# Patient Record
Sex: Female | Born: 1940
Health system: Southern US, Community
[De-identification: ages and names within clinical notes are randomized; demographics above are authoritative.]

## PROBLEM LIST (undated history)

## (undated) DIAGNOSIS — I340 Nonrheumatic mitral (valve) insufficiency: Secondary | ICD-10-CM

## (undated) DIAGNOSIS — Z9989 Dependence on other enabling machines and devices: Secondary | ICD-10-CM

## (undated) DIAGNOSIS — E785 Hyperlipidemia, unspecified: Secondary | ICD-10-CM

## (undated) DIAGNOSIS — G4733 Obstructive sleep apnea (adult) (pediatric): Secondary | ICD-10-CM

## (undated) DIAGNOSIS — I420 Dilated cardiomyopathy: Secondary | ICD-10-CM

## (undated) DIAGNOSIS — C801 Malignant (primary) neoplasm, unspecified: Secondary | ICD-10-CM

## (undated) DIAGNOSIS — H269 Unspecified cataract: Secondary | ICD-10-CM

## (undated) DIAGNOSIS — M1990 Unspecified osteoarthritis, unspecified site: Secondary | ICD-10-CM

## (undated) DIAGNOSIS — K219 Gastro-esophageal reflux disease without esophagitis: Secondary | ICD-10-CM

## (undated) DIAGNOSIS — T7840XA Allergy, unspecified, initial encounter: Secondary | ICD-10-CM

## (undated) DIAGNOSIS — I351 Nonrheumatic aortic (valve) insufficiency: Secondary | ICD-10-CM

## (undated) DIAGNOSIS — D649 Anemia, unspecified: Secondary | ICD-10-CM

## (undated) DIAGNOSIS — I10 Essential (primary) hypertension: Secondary | ICD-10-CM

## (undated) DIAGNOSIS — I071 Rheumatic tricuspid insufficiency: Secondary | ICD-10-CM

## (undated) DIAGNOSIS — G459 Transient cerebral ischemic attack, unspecified: Secondary | ICD-10-CM

## (undated) DIAGNOSIS — Z87898 Personal history of other specified conditions: Secondary | ICD-10-CM

## (undated) DIAGNOSIS — I272 Pulmonary hypertension, unspecified: Secondary | ICD-10-CM

## (undated) HISTORY — DX: Obstructive sleep apnea (adult) (pediatric): Z99.89

## (undated) HISTORY — PX: MELANOMA EXCISION: SHX5266

## (undated) HISTORY — DX: Nonrheumatic mitral (valve) insufficiency: I34.0

## (undated) HISTORY — DX: Anemia, unspecified: D64.9

## (undated) HISTORY — PX: ABDOMINAL HYSTERECTOMY: SHX81

## (undated) HISTORY — DX: Pulmonary hypertension, unspecified: I27.20

## (undated) HISTORY — PX: CHOLECYSTECTOMY: SHX55

## (undated) HISTORY — PX: CATARACT EXTRACTION, BILATERAL: SHX1313

## (undated) HISTORY — DX: Personal history of other specified conditions: Z87.898

## (undated) HISTORY — DX: Dilated cardiomyopathy: I42.0

## (undated) HISTORY — DX: Allergy, unspecified, initial encounter: T78.40XA

## (undated) HISTORY — DX: Unspecified osteoarthritis, unspecified site: M19.90

## (undated) HISTORY — PX: SKIN BIOPSY: SHX1

## (undated) HISTORY — DX: Essential (primary) hypertension: I10

## (undated) HISTORY — PX: ANKLE FRACTURE SURGERY: SHX122

## (undated) HISTORY — DX: Hyperlipidemia, unspecified: E78.5

## (undated) HISTORY — DX: Obstructive sleep apnea (adult) (pediatric): G47.33

## (undated) HISTORY — DX: Gastro-esophageal reflux disease without esophagitis: K21.9

## (undated) HISTORY — DX: Malignant (primary) neoplasm, unspecified: C80.1

## (undated) HISTORY — DX: Nonrheumatic aortic (valve) insufficiency: I35.1

## (undated) HISTORY — DX: Unspecified cataract: H26.9

## (undated) HISTORY — DX: Rheumatic tricuspid insufficiency: I07.1

## (undated) HISTORY — PX: DENTAL SURGERY: SHX609

## (undated) HISTORY — PX: APPENDECTOMY: SHX54

## (undated) HISTORY — DX: Transient cerebral ischemic attack, unspecified: G45.9

## (undated) HISTORY — PX: OTHER SURGICAL HISTORY: SHX169

---

## 2000-01-14 ENCOUNTER — Other Ambulatory Visit: Admission: RE | Admit: 2000-01-14 | Discharge: 2000-01-14 | Payer: Self-pay | Admitting: Obstetrics and Gynecology

## 2001-02-21 ENCOUNTER — Other Ambulatory Visit: Admission: RE | Admit: 2001-02-21 | Discharge: 2001-02-21 | Payer: Self-pay | Admitting: Obstetrics and Gynecology

## 2001-08-08 ENCOUNTER — Encounter: Payer: Self-pay | Admitting: Neurology

## 2001-08-08 ENCOUNTER — Encounter: Payer: Self-pay | Admitting: Emergency Medicine

## 2001-08-08 ENCOUNTER — Observation Stay (HOSPITAL_COMMUNITY): Admission: EM | Admit: 2001-08-08 | Discharge: 2001-08-09 | Payer: Self-pay | Admitting: Emergency Medicine

## 2002-03-19 ENCOUNTER — Other Ambulatory Visit: Admission: RE | Admit: 2002-03-19 | Discharge: 2002-03-19 | Payer: Self-pay | Admitting: Obstetrics and Gynecology

## 2003-03-06 ENCOUNTER — Ambulatory Visit (HOSPITAL_COMMUNITY): Admission: RE | Admit: 2003-03-06 | Discharge: 2003-03-06 | Payer: Self-pay | Admitting: Family Medicine

## 2004-04-29 ENCOUNTER — Other Ambulatory Visit: Admission: RE | Admit: 2004-04-29 | Discharge: 2004-04-29 | Payer: Self-pay | Admitting: Obstetrics and Gynecology

## 2005-06-07 ENCOUNTER — Other Ambulatory Visit: Admission: RE | Admit: 2005-06-07 | Discharge: 2005-06-07 | Payer: Self-pay | Admitting: Obstetrics and Gynecology

## 2007-07-18 ENCOUNTER — Ambulatory Visit: Payer: Self-pay | Admitting: Internal Medicine

## 2007-08-01 ENCOUNTER — Ambulatory Visit: Payer: Self-pay | Admitting: Internal Medicine

## 2007-08-01 HISTORY — PX: COLONOSCOPY: SHX174

## 2008-08-28 ENCOUNTER — Encounter: Payer: Self-pay | Admitting: Emergency Medicine

## 2008-08-29 ENCOUNTER — Inpatient Hospital Stay (HOSPITAL_COMMUNITY): Admission: EM | Admit: 2008-08-29 | Discharge: 2008-08-30 | Payer: Self-pay | Admitting: Family Medicine

## 2008-08-29 ENCOUNTER — Ambulatory Visit: Payer: Self-pay | Admitting: Family Medicine

## 2010-06-11 ENCOUNTER — Ambulatory Visit: Payer: Self-pay | Admitting: Cardiology

## 2010-06-11 LAB — LIPID PANEL
Cholesterol: 185 mg/dL (ref 0–200)
LDL Cholesterol: 108 mg/dL
Triglycerides: 140 mg/dL (ref 40–160)

## 2010-10-19 ENCOUNTER — Encounter: Payer: Self-pay | Admitting: Cardiology

## 2010-10-19 DIAGNOSIS — J45909 Unspecified asthma, uncomplicated: Secondary | ICD-10-CM | POA: Insufficient documentation

## 2010-10-19 DIAGNOSIS — Z87898 Personal history of other specified conditions: Secondary | ICD-10-CM | POA: Insufficient documentation

## 2010-10-19 DIAGNOSIS — I10 Essential (primary) hypertension: Secondary | ICD-10-CM | POA: Insufficient documentation

## 2010-12-08 ENCOUNTER — Other Ambulatory Visit: Payer: Self-pay | Admitting: Cardiology

## 2010-12-08 DIAGNOSIS — E785 Hyperlipidemia, unspecified: Secondary | ICD-10-CM

## 2010-12-08 DIAGNOSIS — Z79899 Other long term (current) drug therapy: Secondary | ICD-10-CM

## 2010-12-09 ENCOUNTER — Ambulatory Visit: Payer: Self-pay | Admitting: Cardiology

## 2010-12-29 ENCOUNTER — Encounter: Payer: Self-pay | Admitting: Cardiology

## 2011-01-25 NOTE — Consult Note (Signed)
Kerry [name], Kerry [name]               ACCOUNT NO.:  [id number]   MEDICAL RECORD NO.:  [id number]          PATIENT TYPE:  INP   LOCATION:  4715                         FACILITY:  [facility]   PHYSICIAN:  Cassell Clement, M.D. DATE OF BIRTH:  1941-06-16   DATE OF CONSULTATION:  08/29/2008  DATE OF DISCHARGE:                                 CONSULTATION   Thank you for requesting Cardiology consultation on this 70 year old  woman for evaluation of syncope.  Kerry [name] was admitted on August 29, 2008, because of syncope, which occurred earlier in the afternoon of  August 28, 2008.  The patient was working at [employer].  She was  stocking shelves, and she recalled just standing still when she suddenly  passed out and hit the back of her head.  She did not have any warning.  There was no aura or chest pain or shortness of breath or blurred vision  or weakness prior to the episode.  The patient's fall was witnessed by a  coworker.  She states that she does not remember anything until she was  in the hospital.  She was apparently somewhat disoriented and confused  and amnesic when she was first evaluated by the emergency room physician  at [facility].  Of note, it is the fact that the patient  was previously hospitalized by Dr. Anne Hahn on August 08, 2001, for  transient global amnesia.  The patient does not have any history of  known heart problems.  She does not have any history of high blood  pressure or exertional chest pain or rheumatic heart disease.  She does  not have any history of heart murmur or tachycardia or palpitations.   She is on very little medicine.  She takes an aspirin 81 mg daily and  she takes Tylenol p.r.n.   FAMILY HISTORY:  Reveals that her mother has heart problems and she has  a daughter with mitral valve prolapse.   SOCIAL HISTORY:  Reveals that she is a widow.  She has 2 daughters.  The  patient works at [employer] where she has worked for the  last 9 years  or so.  Social history reveals that she does not use tobacco or alcohol.   REVIEW OF SYSTEMS:  Reveals no change in bowel habits.  She is not  having any hematochezia or melena.  She is not having any urinary tract  symptoms.  She is having no fevers, night sweats, weight loss, or other  constitutional symptoms.  She does not have any history of diabetes or  thyroid problems.  All other systems negative in detail.   PHYSICAL EXAMINATION:  VITAL SIGNS:  Her blood pressure is 118/53, pulse  is 74 and regular and she is in normal sinus rhythm, oxygen saturation  is 95% on room air, and temperature is 97.6.  GENERAL APPEARANCE:  A somewhat plethoric middle-aged woman in no acute  distress.  HEENT: The pupils were equal and reactive.  Extraocular movements are  full.  There is no diplopia.  There is no nystagmus.  Conjunctivae  are  nonicteric.  Mouth and pharynx are normal.  NECK:  Carotids have a normal upstroke.  No bruits.  Jugular venous  pressure is normal.  Thyroid normal.  There is no lymphadenopathy.  CHEST:  Clear to percussion and auscultation.  HEART:  A faint systolic ejection murmur at the base.  There is no  diastolic murmur.  There is no gallop or rub.  ABDOMEN:  Soft and nontender.  Liver not enlarged.  EXTREMITIES:  No cyanosis, clubbing, or edema.  NEUROLOGIC:  She is oriented and alert.  She is complaining of a  headache, and she has just recently returned from having her MRI scan.   Her admission evaluation includes chest x-ray showing borderline heart  size considering AP projection, but no evidence of congestive heart  failure.  The patient also had a CT scan of the head, which was negative  for fracture and there was no acute abnormality and no infarct or mass,  and she also had negative cervical spine CT.   Her electrocardiogram done 0852 on August 29, 2008, shows normal sinus  rhythm with normal intervals, and is essentially within normal  limits.   Her cardiac enzymes are normal.  Hemoglobin 13.5, hematocrit 39, and  white count 8500.  Electrolytes normal.  BUN 12 and creatinine 0.55.  Cholesterol 199 with an LDL of 125 and HDL of 57 and triglycerides of  83.   IMPRESSION:  Syncope, uncertain etiology, possibly vasovagal.  So far,  no evidence of a primary cardiac etiology, but unfortunately her 2-  dimensional echocardiogram requested for today has not been done.  This  will be helpful in further assessing her etiology for her syncope.  If  it turns out that her workup is otherwise complete over the course of  the weekend and discharge is anticipated, we could do an outpatient  echocardiogram next week in our office.   In the meantime, she should remain on telemetry to be sure that she is  not having an intermittent arrhythmia, which could explain her syncopal  episode.  The results from today's MRI is pending.           ______________________________  Cassell Clement, M.D.     TB/MEDQ  D:  08/29/2008  T:  08/30/2008  Job:  [id number]   cc:   Paula Compton, MD

## 2011-01-25 NOTE — Discharge Summary (Signed)
Kerry [name], Kerry [name]               ACCOUNT NO.:  [id number]   MEDICAL RECORD NO.:  [id number]          PATIENT TYPE:  INP   LOCATION:  4715                         FACILITY:  [facility]   PHYSICIAN:  Nestor Ramp, MD        DATE OF BIRTH:  Jan 29, 1941   DATE OF ADMISSION:  08/29/2008  DATE OF DISCHARGE:  08/30/2008                               DISCHARGE SUMMARY   PRIMARY CARE PHYSICIAN:  [facility].   DISCHARGE DIAGNOSES:  1. Syncope.  2. Hypertension.  3. Headache.  4. Nausea and vomiting.   DISCHARGE MEDICATIONS:  1. Aspirin 81 mg by mouth daily.  2. Metoprolol 50 mg by mouth twice daily.  3. Phenergan 12.5 mg by mouth every 4-6 hours as needed for nausea.  4. Ibuprofen 800 mg by mouth every 8 hours as needed for pain and she      was instructed to take this with food.   CONSULTS:  [facility].   PROCEDURES:  CT of the head without contrast and CT of the cervical  spine without contrast on August 28, 2008, were both negative for  acute abnormalities or fractures.  Chest x-ray on August 28, 2008,  impression; borderline cardiomegaly with no active disease.  MRI of the  head without contrast on August 29, 2008, was normal and an MRA of the  head without contrast on August 29, 2008, was negative.   LABORATORY DATA:  TSH 3.549.  Cardiac enzymes negative x3.  Sodium 139,  potassium 3.7, chloride 108, CO2 of 25, glucose 96, BUN 12, creatinine  0.55, and calcium 9.2.  White blood cell count 8.5, hemoglobin 13.5,  hematocrit 39.1, and platelet count 258.  UA was negative.  Cholesterol  199, triglyceride 83, HDL 57, and LDL 125.  PT 13.3, INR 1.0, and PTT  29.  Ammonia 12.   BRIEF HOSPITAL COURSE:  [name] is a pleasant 70 year old  female who was admitted for a syncopal episode of unknown etiology.  1. Syncopal episode.  This episode was witnessed by a coworker and was      not consistent with seizure.  Differential for this syncope      included neurological  as well as cardiac etiologies.  We did note      that the patient was previously hospitalized by Dr. Anne Hahn on      August 08, 2001 and diagnosed with a transient global amnesia.      This visit was very similar to the previous visit during which time      she was diagnosed with this transient global amnesia.  Cardiology      was consulted.  Cardiovascular-wise, the patient remained stable      and in normal sinus rhythm on telemetry.  Her repeat EKG was      normal.  As a 2-D echo was difficult to obtain during her hospital      stay during the weekend, it was recommended by Cardiology that she      would benefit from outpatient echo in the office.  Orthostatics  were normal during the visit, initial workup.  Cardiac enzymes were      negative x3.  Neurologically, the patient remained stable      throughout her stay.  She had no moments of altered mental status,      and she had no focal deficits.  MRI and MRA of the head were      negative.  Her TSH was within normal limits at 3.549.  During her      stay, [name] did complain of frontal headache that was mild to      moderate and controlled with Toradol.  She also complained of      nausea and had 2 episodes of vomiting early on her hospital stay on      the 18th.  This was controlled with Phenergan.  Her headache and      nausea resolved prior to her discharge.  We did note that the      patient has a history of migraine but says, the last time she had a      painful migraine was in 1986.  She does have history of multiple      painful migraines as well as central scotomas.  As the patient was      stable, all labs were negative, and all tests were negative, the      patient was discharged with plan for outpatient workup of this      issue.  2. Hypertension.  The patient's blood pressures were mildly elevated      during her stay with systolics ranging from 126-140 and heart rate      in the high 70s.  She was started on low  dose of metoprolol at 50      mg b.i.d.  This was suggested by Cardiology as it is      cardioprotective, will lower blood pressure, and is also indicated      for migraine prophylaxis.   The patient was instructed to follow up with [facility] in 1-2 to weeks.  She was also given a number for [facility] to obtain her echo  and further cardiac workup next Monday.  She was given a red flag, so it  would prompt a quick return to the emergency department.      Helane Rima, MD  Electronically Signed      Nestor Ramp, MD  Electronically Signed    EW/MEDQ  D:  08/30/2008  T:  08/30/2008  Job:  [id number]

## 2011-01-28 NOTE — Discharge Summary (Signed)
[facility]  Patient:    Kerry [name], Kerry [name] Visit Number: [id number] MRN: [id number]          Service Type: MED Location: 3W [phone] [id number] Attending Physician:  Lesly Dukes Dictated by:   Marlan Palau, M.D. Admit Date:  08/08/2001 Disc. Date: 08/09/01   CC:         [facility], [address]   Discharge Summary  ADMITTING DIAGNOSIS:  Amnesia, probable transient global amnesia.  DISCHARGE DIAGNOSIS:  Transient global amnesia.  PROCEDURES DURING THIS ADMISSION: 1. Computer tomogram of the brain. 2. Magnetic resonance imaging of the brain. 3. Magnetic resonance imaging/angiogram of the intracranial and extracranial    vessels. 4. Electroencephalogram study.  COMPLICATIONS OF THE ABOVE PROCEDURES:  None.  HISTORY OF PRESENT ILLNESS:  [place]. Kerry [name] is a 70 year old right-handed white female born October 30, 1940, with a history of moderate obesity, otherwise in excellent health.  Patient was brought to the [facility] emergency room when she was noted to be acting in an unusual fashion while at work.  Patient had just unloaded a truck but then began acting confused saying she needed to unload the same truck again.  Patient was asking the same questions again and again and coworkers called family who brought her to the emergency room.  Patient was noted to be alert, cooperative, but appeared to have significant short-term memory deficits.  No numbness or weakness was reported.  Mild headache was reported.  Neurology was asked to see the patient for further evaluation.  PAST MEDICAL HISTORY: 1. New onset of amnesia, likely transient global amnesia. 2. Moderate obesity. 3. Hysterectomy. 4. History of right leg fracture in the past. 5. History of left ankle fracture in the past. 6. History of gallbladder surgery. 7. History of appendectomy in the past. 8. History of tonsillectomy. 9. History of asthma.  Patient was  on multivitamins prior to admission, has no known allergies, does not smoke or drink.  Please refer to history and physical dictation summary for social history, family history, review of systems, and physical examination.  LABORATORY VALUES:  Sodium 142, potassium 3.7, chloride 107, CO2 25, glucose 107, BUN 13, creatinine 0.7, calcium 9.5, INR 0.9.  White count 9.1, hemoglobin 14.0, hematocrit 39.8, MCV 88.5, platelets 293.  Patient had an EKG revealing normal sinus rhythm, voltage criteria for left ventricular hypertrophy, heart rate of 88.  HOSPITAL COURSE:  Patient was admitted for evaluation of the amnesia.  An MRI of the brain showed minimal nonspecific white matter changes, no acute stroke noted.  MRI/angiogram of the intracranial and extracranial vessels showed no significant stenosis.  There appeared to be a mild irregular narrowing of the left vertebral artery at C1-2 level.  EEG study was also performed and revealed no abnormalities.  Patient was observed overnight and appeared to have full resolution of short-term memory by the next morning.  Patient at this time is alert, cooperative, oriented x 3, is able to recall three of three words at five minutes and, in fact, can recall the same three words that were given to her yesterday.  Deep tendon reflexes are symmetric.  Patient will be discharged to home today, taking aspirin 325 mg one daily.  Patient will follow up with [facility] on an as-needed basis. Discharge diagnosis is transient global amnesia.  CONDITION ON DISCHARGE:  Excellent. Dictated by:   Marlan Palau, M.D. Attending Physician:  Lesly Dukes DD:  08/09/01 TD:  08/09/01 Job: [id number]

## 2011-01-28 NOTE — H&P (Signed)
NAMESANOE, HAZAN               ACCOUNT NO.:  [id number]   MEDICAL RECORD NO.:  [id number]          PATIENT TYPE:  INP   LOCATION:  4715                         FACILITY:  [facility]   PHYSICIAN:  Paula Compton, MD        DATE OF BIRTH:  May 29, 1941   DATE OF ADMISSION:  08/29/2008  DATE OF DISCHARGE:  08/30/2008                              HISTORY & PHYSICAL   PRIMARY CARE PHYSICIAN:  [facility].   CHIEF COMPLAINT:  Fall altered mental status.   HISTORY OF PRESENT ILLNESS:  This is a 70 year old female, who presents  status post fall, altered mental status event on August 28, 2008.  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  Denies  headache, presyncope, nausea, emesis, loss of bowel or bladder, heart  palpitations, sudden change from sitting to standing, chest pain, or  dyspnea.  The patient states that the fall witnessed no seizure-like  activity.  Does not remember if she had eaten lunch on that day.  Event  are occurred on that day.  States that she was confused, however, was  able to recognize daughter at hospital, remembers events prior to fall,  but did not know why she was in the hospital.  Does not know she had hit  her head, but head and neck hurt initially, neck now sore.  No dizziness  or weakness now or prior to event.  No slurring speech.  Of note, had  somewhat similar incident in 2002 while she was working under a car and  became amnestic.  No ringing in ears or vertigo.  No recent illness or  head trauma.   PAST MEDICAL HISTORY:  History of transient global amnesia with multiple  episodes, 2002 was last episode.   ALLERGIES:  No known drug allergies.   MEDICATIONS:  1. Aspirin 81 mg p.o. daily.  2. Claritin.  3. Tylenol.   PAST SURGICAL HISTORY:  1. Appendectomy.  2. Hysterectomy.  3. Left ankle fracture repair.   FAMILY HISTORY:  Mother with  hypertension, diabetes, and cancer.  Father  with Alzheimer dementia.   SOCIAL HISTORY:  Lives independently, works at [employer].  No tobacco,  alcohol, or drugs.   REVIEW OF SYSTEMS:  As per HPI.  Also positive for otitis externa,  otherwise negative.   PHYSICAL EXAMINATION:  VITAL SIGNS:  Temperature 97.6, pulse 74,  respiratory rate 20, blood pressure 155-178/75-78, saturating 98% on  room air.  GENERAL:  Lying in bed, pleasant, alert, no acute distress.  HEAD:  NCAT.  NECK:  No carotid bruits bilaterally, full range of motion.  EYES:  EOMI, PERRL, no conjunctival injection.  NOSE:  No external deformities.  MOUTH:  Slightly dry mucous membranes, no erythema or exudate.  CARDIAC:  A 2/6 systolic murmur at left upper sternal border.  Regular  rate and rhythm.  PULMONARY:  Clear to auscultation bilaterally with normal work of  breathing.  ABDOMEN:  Soft, nontender, nondistended with positive bowel  sounds.  MUSCULOSKELETAL:  5/5 strength in all extremities.  NEUROLOGIC:  Cranial nerves II through XII intact, no focal weakness.  Sensation intact in bilateral upper extremities.  Alert and oriented x3.  EXTREMITIES:  A 2+ radial and pedal pulses.  Less than 2 seconds  capillary refill.  No lower extremity edema.  PSYCH:  Alert and oriented x3.  Memory intact for remote and recent  events except as described per HPI.   LABORATORY DATA:  White blood cells 9.4, hemoglobin 13.6, and platelets  265.  Electrolytes within normal limits.  AST 33, ALT 36.  Point-of-care  cardiac enzymes negative x1.  Ammonia 12.  UA within normal limits.  CT  spine head showed small to moderate central disk protrusion C4-C5,  moderate disk degeneration, and spondylosis at C5-C6 with diffuse  uncinate spurring, mild spinal stenosis, no fracture, no acute  intracranial abnormality.   Chest x-ray borderline cardiomegaly with no acute cardiopulmonary  disease.   ASSESSMENT AND PLAN:  This is a 70 year old  female, status post fall,  altered mental status, now resolved except for memory deficit as  described in HPI.  Past medical history significant for prior transient  global amnesia.  1. Fall, altered mental status:  Orthostatic versus cardiac versus      stroke versus seizure disorder.  We will check orthostatic blood      pressure.  EKG without change, normal sinus rhythm.  We will place      on telemetry.  We will check cardiac enzymes.  2-D echo.  MRI, MRA      for stroke workup.  A fasting lipid panel.  Neuro exam within      normal limits.  We will consider consult Neurology regarding EEG.      Prior workup included EEG which was within normal limits.  We will      repeat EKG in the morning.  We will check coags.  Electrolytes      within normal limits, we will repeat in the morning.  No obvious      signs of infection in this patient.  2. History of transient global amnesia.  Consider psych consult during      this hospitalization as well.  We will continue to monitor.  3. Hypertension.  We will recheck blood pressure.  Consider beta-      blocker for pressure control.  However, we will monitor overnight.      Care in lowering blood pressure and possible stroke/transient      ischemic attack.  4. Fluids, electrolytes, nutrition, and gastrointestinal:  Regular      diet.  5. Prophylaxis.  Lovenox for deep vein thrombosis prophylaxis as this      is indicated in stroke, transient ischemic attack.   DISPOSITION:  Pending workup of altered mental status/a fall.      Bobby Rumpf, MD  Electronically Signed      Paula Compton, MD  Electronically Signed   KC/MEDQ  D:  09/15/2008  T:  09/15/2008  Job:  [id number]

## 2011-01-28 NOTE — H&P (Signed)
[facility]  Patient:    Kerry [name], Kerry [name] Visit Number: [id number] MRN: [id number]          Service Type: MED Location: 3W [phone] Attending Physician:  Lesly Dukes Dictated by:   Marlan Palau, M.D. Admit Date:  08/08/2001   CC:         [facility] [address]   History and Physical  HISTORY OF PRESENT ILLNESS:  Kerry [name] is a 70 year old right-handed white female born 07-12-1941, with a history of some mild asthma.  This patient has been in excellent health otherwise, was at work today.  The patient had unloaded a truck earlier but then around 10 a.m. began asking the same questions again and again.  The patient continued to say that she would unload the truck that she had just unloaded.  The patient was brought to the emergency room for evaluation due to the apparent confusion and memory problems.  Memory problems have persisted but have improved slightly since being in the emergency room.  A CT scan of the brain has been done and is unremarkable.  Neurology was asked to see the patient for further evaluation. The patient did complain of a headache but complains the headache is improving at this point.  PAST MEDICAL HISTORY:  1. History of amnesia, likely transient global amnesia.  2. Moderate obesity.  3. Hysterectomy.  4. History of right leg fracture.  5. History of left ankle fracture in the past.  6. History of gallbladder surgery.  7. History of appendectomy.  8. History of tonsillectomy.  9. History of asthma. 10. History of migraine headache.  MEDICATIONS:  Multivitamins.  ALLERGIES:  No known allergies.  SOCIAL HISTORY:  The patient does not smoke or drink.  This patient lives in the [place] area, is widowed, has two children.  One daughter is alive and well, one daughter has a history of mitral valve prolapse.  FAMILY HISTORY:  Notable that mother and father are both living.  Mother  has diabetes.  Father has had gallbladder surgery, is to have a total hip replacement in the near future.  The patient has one brother with exposure to agent orange, three sisters, two with diabetes, one with multiple sclerosis.  REVIEW OF SYSTEMS:  Notable for no recent fevers or chills.  The patient did have a headache today, otherwise has headaches on a rare basis.  The patient denies any visual field changes, problems with swallowing.  Denies neck stiffness, shortness of breath, chest pains, nausea, or vomiting.  Denies any focal numbness or weakness on the face, arms, or legs, or gait instability.  PHYSICAL EXAMINATION:  VITAL SIGNS:  Blood pressure is 139/82, heart rate is 92, respiratory rate 16, temperature afebrile.  GENERAL:  This patient is a moderately obese white female who is alert and cooperative at the time of examination.  HEENT:  Head is atraumatic.  Eyes:  Pupils are equal, round, and react to light.  Discs are flat bilaterally.  NECK:  Supple.  No carotid bruits noted.  CHEST:  Clear.  CARDIAC:  A regular rate and rhythm, no obvious murmurs or rubs noted.  EXTREMITIES:  Without significant edema.  The left ankle is slightly more swollen than the right, however.  NEUROLOGIC:  Cranial nerves as above.  Facial symmetry is present.  The patient notes good sensation in the face to pinprick and soft touch bilaterally.  The patient has good strength of the facial muscles  and the muscles of head turning and shoulder shrug bilaterally.  Visual fields are full to double simultaneous stimulation.  The speech is well-enunciated and not aphasic.  Motor testing is full, normal on all fours.  The patient has fair finger-nose-finger, heel-to-shin.  Deep tendon reflexes are symmetric and normal.  Ankle jerks are depressed bilaterally.  Toes are neutral bilaterally. The patient is not ambulated.  No pronator drift was seen.  The patient was unable to recall any of three  words at five minutes, with repeated testing and _____ eventually got two of three words and eventually was able to state the date after three or four tries.  The patient has good recollection for past events that are distant events.  DIAGNOSTIC STUDIES:  EKG reveals normal sinus rhythm, voltage criteria for left ventricular hypertrophy, heart rate of 88.  Laboratory values are notable for INR of 0.9.  The patient has a white count of 9.1, hemoglobin of 14.0, hematocrit of 39.8, MCV of 88.5, platelets of 293.  Electrolytes are pending at this time.  IMPRESSION: 1. Amnesia likely secondary to transient global amnesia. 2. History of asthma. 3. History of migraine.  This patient has what appears to be a fairly typical onset of transient global amnesia with gradual resolution at this point.  Will admit this patient for further workup at this point.  I suspect the patient will fully clear with the memory deficit.  Memory deficits, however, have not completely cleared at this time.  PLAN: 1. Admission to [facility]. 2. MRI of the brain. 3. MRI angiogram. 4. EEG study. 5. Aspirin therapy.  Will follow patients clinical course while in house. Dictated by:   Marlan Palau, M.D. Attending Physician:  Lesly Dukes DD:  08/08/01 TD:  08/08/01 Job: [id number]

## 2011-05-27 ENCOUNTER — Other Ambulatory Visit: Payer: Self-pay | Admitting: Cardiology

## 2011-05-27 DIAGNOSIS — I10 Essential (primary) hypertension: Secondary | ICD-10-CM

## 2011-05-27 DIAGNOSIS — E785 Hyperlipidemia, unspecified: Secondary | ICD-10-CM

## 2011-06-01 ENCOUNTER — Ambulatory Visit (INDEPENDENT_AMBULATORY_CARE_PROVIDER_SITE_OTHER): Payer: Managed Care, Other (non HMO) | Admitting: Cardiology

## 2011-06-01 ENCOUNTER — Other Ambulatory Visit (INDEPENDENT_AMBULATORY_CARE_PROVIDER_SITE_OTHER): Payer: Managed Care, Other (non HMO) | Admitting: *Deleted

## 2011-06-01 ENCOUNTER — Encounter: Payer: Self-pay | Admitting: Cardiology

## 2011-06-01 ENCOUNTER — Other Ambulatory Visit: Payer: Self-pay | Admitting: Cardiology

## 2011-06-01 VITALS — BP 140/80 | HR 66 | Wt 192.0 lb

## 2011-06-01 DIAGNOSIS — I10 Essential (primary) hypertension: Secondary | ICD-10-CM

## 2011-06-01 DIAGNOSIS — J45909 Unspecified asthma, uncomplicated: Secondary | ICD-10-CM

## 2011-06-01 DIAGNOSIS — E7800 Pure hypercholesterolemia, unspecified: Secondary | ICD-10-CM

## 2011-06-01 DIAGNOSIS — I119 Hypertensive heart disease without heart failure: Secondary | ICD-10-CM

## 2011-06-01 DIAGNOSIS — E785 Hyperlipidemia, unspecified: Secondary | ICD-10-CM

## 2011-06-01 DIAGNOSIS — R002 Palpitations: Secondary | ICD-10-CM

## 2011-06-01 LAB — LIPID PANEL
Cholesterol: 228 mg/dL — ABNORMAL HIGH (ref 0–200)
HDL: 60.8 mg/dL (ref >39.00)
Triglycerides: 86 mg/dL (ref 0.0–149.0)

## 2011-06-01 LAB — HEPATIC FUNCTION PANEL
Albumin: 4.2 g/dL (ref 3.5–5.2)
Total Protein: 7.6 g/dL (ref 6.0–8.3)

## 2011-06-01 LAB — BASIC METABOLIC PANEL
BUN: 14 mg/dL (ref 6–23)
CO2: 28 mEq/L (ref 19–32)
Calcium: 9.5 mg/dL (ref 8.4–10.5)
Creatinine, Ser: 0.6 mg/dL (ref 0.4–1.2)
Glucose, Bld: 93 mg/dL (ref 70–99)

## 2011-06-01 MED ORDER — METOPROLOL TARTRATE 25 MG PO TABS: 25.0000 mg | ORAL_TABLET | Freq: Every day | ORAL | Status: DC

## 2011-06-01 NOTE — Assessment & Plan Note (Signed)
We're checking lab work today to see where we stand with her lipids.  Presently she is on reduced rice.  She has gained weight since last visit

## 2011-06-01 NOTE — Assessment & Plan Note (Signed)
The patient has not been having any significant problems with asthma .

## 2011-06-01 NOTE — Progress Notes (Signed)
Kerry [name] Date of Birth:  13-Oct-1940 [facility] / [facility] [address]   [address] [place], [name]  [address] [phone]           Fax   [phone]  History of Present Illness: This pleasant 70 year old Caucasian female is seen for a one-year office visit.  He has a history of essential hypertension and a remote history of syncope.  She had a normal nuclear stress test in February 2010 which showed no ischemia.  She has had syncope in December 2009 but no subsequent episodes.  She has hypercholesterolemia andPreviously had been on lovastatin which she did not tolerate because of severe myalgias.  She is now on red yeast rice.  Current Outpatient Prescriptions  Medication Sig Dispense Refill  . Coenzyme Q10 (CO Q 10 PO) Take by mouth daily.        Marland Kitchen Fexofenadine HCl (ALLEGRA PO) Take by mouth. Every 24 hours       . fish oil-omega-3 fatty acids 1000 MG capsule Take by mouth daily.        . Multiple Vitamin (MULTIVITAMIN) tablet Take 1 tablet by mouth daily.        . Red Yeast Rice Extract (RED YEAST RICE PO) Take by mouth. 600mg  daily       . metoprolol tartrate (LOPRESSOR) 25 MG tablet Take 1 tablet (25 mg total) by mouth daily.  90 tablet  3    Allergies  Allergen Reactions  . Lovastatin     myalgias  . Sudafed (Pseudoephedrine Hcl)     Patient Active Problem List  Diagnoses  . HTN (hypertension)  . History of syncope  . Asthma    History  Smoking status  . Never Smoker   Smokeless tobacco  . Not on file    History  Alcohol Use: Not on file    Family History  Problem Relation Age of Onset  . Mitral valve prolapse Daughter   . Heart disease Mother     Review of Systems: Constitutional: no fever chills diaphoresis or fatigue or change in weight.  Head and neck: no hearing loss, no epistaxis, no photophobia or visual disturbance. Respiratory: No cough, shortness of breath or wheezing. Cardiovascular: No chest pain peripheral edema,  palpitations. Gastrointestinal: No abdominal distention, no abdominal pain, no change in bowel habits hematochezia or melena. Genitourinary: No dysuria, no frequency, no urgency, no nocturia. Musculoskeletal:No arthralgias, no back pain, no gait disturbance or myalgias. Neurological: No dizziness, no headaches, no numbness, no seizures, no syncope, no weakness, no tremors. Hematologic: No lymphadenopathy, no easy bruising. Psychiatric: No confusion, no hallucinations, no sleep disturbance.    Physical Exam: Filed Vitals:   06/01/11 0931  BP: 140/80  Pulse: 66  The general appearance reveals a well-developed well-nourished slightly obese woman in no acute distress.Pupils equal and reactive.   Extraocular Movements are full.  There is no scleral icterus.  The mouth and pharynx are normal.  The neck is supple.  The carotids reveal no bruits.  The jugular venous pressure is normal.  The thyroid is not enlarged.  There is no lymphadenopathy.  The chest is clear to percussion and auscultation. There are no rales or rhonchi. Expansion of the chest is symmetrical.  The precordium is quiet.  The first heart sound is normal.  The second heart sound is physiologically split.  There is no murmur gallop rub or click.  There is no abnormal lift or heave.  The abdomen is soft and  nontender. Bowel sounds are normal. The liver and spleen are not enlarged. There Are no abdominal masses. There are no bruits.   The pedal pulses are good.  There is no phlebitis or edema.  There is no cyanosis or clubbing.She has had previous fracture of the left ankle and previous fracture of the right lower leg.  She is trace edema intermittently Strength is normal and symmetrical in all extremities.  There is no lateralizing weakness.  There are no sensory deficits.  The skin is warm and dry.  There is no rash.      Assessment / Plan:  Continue same medication.  Await results of lab work.  She will buy a home blood  pressure cuff to be sure that her systolic pressure is normal at home

## 2011-06-01 NOTE — Assessment & Plan Note (Signed)
Patient has a history of essential hypertension.  She has not been experiencing any chest pain.  She's had rare palpitations.  She does have some element of whitecoat hypertension and her blood pressures outside the doctor's office are allegedly better than they are here.  When I checked her today I got 160/80 which was higher than earlier today.  I asked her to buy a home blood pressure cuff and monitor her blood pressure.

## 2011-06-01 NOTE — Patient Instructions (Signed)
Recommend getting home BP cuff and keep record.  Aim 130/80 or less.

## 2011-06-03 ENCOUNTER — Telehealth: Payer: Self-pay | Admitting: *Deleted

## 2011-06-03 ENCOUNTER — Telehealth: Payer: Self-pay | Admitting: Cardiology

## 2011-06-03 NOTE — Progress Notes (Signed)
Advised of labs 

## 2011-06-03 NOTE — Telephone Encounter (Signed)
Advised of labs 

## 2011-06-03 NOTE — Telephone Encounter (Signed)
Message copied by Burnell Blanks on Fri Jun 03, 2011  5:05 PM ------      Message from: Cassell Clement      Created: Thu Jun 02, 2011  1:34 PM       Please report.  The cholesterol is higher and the LDL is high at 140.  Since she is intolerant of statins continue same medicine and work harder on diet and weight loss

## 2011-06-03 NOTE — Telephone Encounter (Signed)
Pt returning call to Melinda. Please call back.  

## 2011-06-03 NOTE — Telephone Encounter (Signed)
Lab results given to patient.

## 2011-06-03 NOTE — Progress Notes (Signed)
Left message

## 2011-06-16 LAB — COMPREHENSIVE METABOLIC PANEL
ALT: 36 U/L — ABNORMAL HIGH (ref 0–35)
AST: 33 U/L (ref 0–37)
CO2: 27 mEq/L (ref 19–32)
Calcium: 9.4 mg/dL (ref 8.4–10.5)
Chloride: 106 mEq/L (ref 96–112)
GFR calc non Af Amer: >60 mL/min (ref >60)
Glucose, Bld: 107 mg/dL — ABNORMAL HIGH (ref 70–99)
Sodium: 140 mEq/L (ref 135–145)
Total Bilirubin: 0.8 mg/dL (ref 0.3–1.2)

## 2011-06-16 LAB — URINALYSIS, ROUTINE W REFLEX MICROSCOPIC
Bilirubin Urine: NEGATIVE
Hgb urine dipstick: NEGATIVE
Specific Gravity, Urine: 1.007 (ref 1.005–1.030)
pH: 6 (ref 5.0–8.0)

## 2011-06-16 LAB — AMMONIA: Ammonia: 12 umol/L (ref 11–35)

## 2011-06-16 LAB — CBC
Hemoglobin: 13.6 g/dL (ref 12.0–15.0)
MCHC: 33.8 g/dL (ref 30.0–36.0)
MCV: 91.2 fL (ref 78.0–100.0)
RBC: 4.42 MIL/uL (ref 3.87–5.11)
WBC: 9.4 10*3/uL (ref 4.0–10.5)

## 2011-06-16 LAB — POCT CARDIAC MARKERS
CKMB, poc: 1.6 ng/mL (ref 1.0–8.0)
Myoglobin, poc: 98.3 ng/mL (ref 12–200)
Troponin i, poc: <0.05 ng/mL (ref 0.00–0.09)

## 2011-06-16 LAB — DIFFERENTIAL
Basophils Absolute: 0 10*3/uL (ref 0.0–0.1)
Lymphocytes Relative: 18 % (ref 12–46)
Lymphs Abs: 1.7 10*3/uL (ref 0.7–4.0)
Neutro Abs: 7 10*3/uL (ref 1.7–7.7)
Neutrophils Relative %: 75 % (ref 43–77)

## 2011-06-17 LAB — CBC
HCT: 39.1 % (ref 36.0–46.0)
Hemoglobin: 13.5 g/dL (ref 12.0–15.0)
MCV: 90 fL (ref 78.0–100.0)
Platelets: 258 10*3/uL (ref 150–400)
RDW: 12.9 % (ref 11.5–15.5)
WBC: 8.5 10*3/uL (ref 4.0–10.5)

## 2011-06-17 LAB — BASIC METABOLIC PANEL
BUN: 12 mg/dL (ref 6–23)
Creatinine, Ser: 0.55 mg/dL (ref 0.4–1.2)
GFR calc non Af Amer: >60 mL/min (ref >60)
Glucose, Bld: 96 mg/dL (ref 70–99)

## 2011-06-17 LAB — LIPID PANEL
Cholesterol: 199 mg/dL (ref 0–200)
LDL Cholesterol: 125 mg/dL — ABNORMAL HIGH (ref 0–99)

## 2011-06-17 LAB — CARDIAC PANEL(CRET KIN+CKTOT+MB+TROPI)
CK, MB: 1.7 ng/mL (ref 0.3–4.0)
Relative Index: 1.2 (ref 0.0–2.5)
Relative Index: 1.7 (ref 0.0–2.5)
Troponin I: <0.01 ng/mL (ref 0.00–0.06)

## 2011-06-17 LAB — APTT: aPTT: 29 seconds (ref 24–37)

## 2011-11-04 ENCOUNTER — Ambulatory Visit (INDEPENDENT_AMBULATORY_CARE_PROVIDER_SITE_OTHER): Payer: Managed Care, Other (non HMO) | Admitting: Family Medicine

## 2011-11-04 VITALS — BP 196/90 | HR 88 | Temp 97.9°F | Resp 18 | Ht 63.0 in | Wt 177.0 lb

## 2011-11-04 DIAGNOSIS — R002 Palpitations: Secondary | ICD-10-CM

## 2011-11-04 DIAGNOSIS — G43009 Migraine without aura, not intractable, without status migrainosus: Secondary | ICD-10-CM

## 2011-11-04 DIAGNOSIS — E7800 Pure hypercholesterolemia, unspecified: Secondary | ICD-10-CM

## 2011-11-04 DIAGNOSIS — G43109 Migraine with aura, not intractable, without status migrainosus: Secondary | ICD-10-CM | POA: Insufficient documentation

## 2011-11-04 DIAGNOSIS — I10 Essential (primary) hypertension: Secondary | ICD-10-CM

## 2011-11-04 MED ORDER — CYCLOBENZAPRINE HCL 10 MG PO TABS: ORAL_TABLET | ORAL | Status: DC

## 2011-11-04 MED ORDER — METOPROLOL SUCCINATE ER 25 MG PO TB24: 25.0000 mg | ORAL_TABLET | Freq: Two times a day (BID) | ORAL | Status: DC

## 2011-11-04 NOTE — Progress Notes (Signed)
  Subjective:    Patient ID: Kerry [name], female    DOB: 1940-12-12, 71 y.o.   MRN: [id number]  HPI  Patient presents with a history of migraine headaches since the 1980's.  2-3 years ago the headaches evolved into ocular migraines which she would experience several times a year.  These headaches would typically resolve within 5-10 minutes after taking several aspirin and resting. With her migraines light appears as if its going through a prism.   Today's headache was persistant in that the ocular symptoms returned multiple times and patient continues to experience a  lingering dull (R) sided headache. Today patient has taken 8 aspirin which patient states has caused an upset stomach  and racing heart beat. Palpitations resolved after several minutes and were not associated with CP/SOB or focal deficits.  Patient sees Dr. Patty Sermons regularly for treatment of her hypertension.  All cardiac evaluation to date has been normal. Patient has a follow up apt with Dr. Patty Sermons 12/12/11.  Believes situational stressors is what triggered her current headache.  Has never seen neurologist nor has she been on any preventive/abortive agent for her headaches.  Review of Systems  Respiratory: Negative for chest tightness and shortness of breath.   Cardiovascular: Positive for palpitations. Negative for chest pain.  Neurological: Negative for dizziness and syncope.       Objective:   Physical Exam  Constitutional: She is oriented to person, place, and time. She appears well-developed and well-nourished.  HENT:  Head: Normocephalic and atraumatic.  Eyes: Conjunctivae and EOM are normal. Pupils are equal, round, and reactive to light.  Neck: Normal range of motion. Neck supple.  Cardiovascular: Normal rate, regular rhythm and normal heart sounds.   Pulmonary/Chest: Effort normal.  Musculoskeletal: Normal range of motion.  Neurological: She is alert and oriented to person, place, and time. She  has normal reflexes. She displays normal reflexes. No cranial nerve deficit or sensory deficit. She exhibits normal muscle tone. Coordination and gait normal.  Reflex Scores:      Bicep reflexes are 2+ on the right side and 2+ on the left side.      Brachioradialis reflexes are 2+ on the right side and 2+ on the left side.      Patellar reflexes are 2+ on the right side and 2+ on the left side. Skin: Skin is warm.  Psychiatric: She has a normal mood and affect.   EKG-NSR; no ischemic changes        Assessment & Plan:   1. Ocular migraine  cyclobenzaprine (FLEXERIL) 10 MG tablet, Ambulatory referral to Neurology  2. Pure hypercholesterolemia    3. HTN (hypertension)  Increase metoprolol 25mg  BID, keep follow up apt with Dr. Patty Sermons   Anticipatory guidance Cautioned against excessive [place] of aspirin Headache calendar

## 2011-12-08 ENCOUNTER — Other Ambulatory Visit: Payer: Self-pay | Admitting: Neurology

## 2011-12-08 DIAGNOSIS — H539 Unspecified visual disturbance: Secondary | ICD-10-CM

## 2011-12-12 ENCOUNTER — Ambulatory Visit (INDEPENDENT_AMBULATORY_CARE_PROVIDER_SITE_OTHER): Payer: Managed Care, Other (non HMO) | Admitting: Cardiology

## 2011-12-12 ENCOUNTER — Encounter: Payer: Self-pay | Admitting: Cardiology

## 2011-12-12 ENCOUNTER — Other Ambulatory Visit (INDEPENDENT_AMBULATORY_CARE_PROVIDER_SITE_OTHER): Payer: Managed Care, Other (non HMO)

## 2011-12-12 VITALS — BP 144/65 | HR 65 | Ht 63.0 in | Wt 176.0 lb

## 2011-12-12 DIAGNOSIS — I119 Hypertensive heart disease without heart failure: Secondary | ICD-10-CM

## 2011-12-12 DIAGNOSIS — E7800 Pure hypercholesterolemia, unspecified: Secondary | ICD-10-CM

## 2011-12-12 DIAGNOSIS — I10 Essential (primary) hypertension: Secondary | ICD-10-CM

## 2011-12-12 DIAGNOSIS — Z87898 Personal history of other specified conditions: Secondary | ICD-10-CM

## 2011-12-12 LAB — BASIC METABOLIC PANEL
Calcium: 9.4 mg/dL (ref 8.4–10.5)
GFR: 99.28 mL/min (ref >60.00)
Sodium: 141 mEq/L (ref 135–145)

## 2011-12-12 LAB — LIPID PANEL
Cholesterol: 199 mg/dL (ref 0–200)
HDL: 57.5 mg/dL (ref >39.00)
Triglycerides: 108 mg/dL (ref 0.0–149.0)

## 2011-12-12 LAB — HEPATIC FUNCTION PANEL
ALT: 21 U/L (ref 0–35)
AST: 21 U/L (ref 0–37)
Albumin: 4 g/dL (ref 3.5–5.2)

## 2011-12-12 NOTE — Assessment & Plan Note (Signed)
The patient has had no recurrence of her syncopal spell

## 2011-12-12 NOTE — Progress Notes (Signed)
Quick Note:  Please report to patient. The recent labs are stable. Continue same medication and careful diet. LDL is still slightly high. Continue regular exercise and strict diet. ______

## 2011-12-12 NOTE — Assessment & Plan Note (Signed)
Patient has a history of hypercholesterolemia.  She treats this with low-cholesterol diet and red yeast rice.  Blood work today is pending.

## 2011-12-12 NOTE — Assessment & Plan Note (Signed)
The patient has a history of essential hypertension.  She is presently on metoprolol 25 mg twice a day.  Her blood pressure has been remaining in normal range.  She has not been having any dizzy spells or syncope

## 2011-12-12 NOTE — Progress Notes (Signed)
Patient ID: Kerry [name], female   DOB: 1941-04-11, 71 y.o.   MRN: [id number]

## 2011-12-12 NOTE — Patient Instructions (Signed)
Your physician recommends that you schedule a follow-up appointment in: 6 months the office will mail a reminder letter 2 months prior appointment date. Your physician recommends that you continue on your current medications as directed. Please refer to the Current Medication list given to you today.

## 2011-12-12 NOTE — Progress Notes (Signed)
Kerry [name] Date of Birth:  21-Apr-1941 [facility] [address] [place], [name]  [address] [phone]  Fax   [phone]  HPI: This pleasant 71 year old woman is seen for a scheduled followup office visit.  She has a past history of essential hypertension and a remote history of syncope.  She does not have any history of ischemic heart disease.  She had a normal nuclear stress test in February 2010.  He has a past history of hypercholesterolemia.  She cannot take statins because of severe myalgias.  She does take red yeast rice.  Current Outpatient Prescriptions  Medication Sig Dispense Refill  . Coenzyme Q10 (CO Q 10 PO) Take by mouth daily.        . cyclobenzaprine (FLEXERIL) 10 MG tablet 1 at bedtime as needed  30 tablet  0  . Fexofenadine HCl (ALLEGRA PO) Take by mouth. Every 24 hours       . fish oil-omega-3 fatty acids 1000 MG capsule Take by mouth daily.        . metoprolol succinate (TOPROL-XL) 25 MG 24 hr tablet Take 1 tablet (25 mg total) by mouth 2 (two) times daily.  60 tablet  3  . Multiple Vitamin (MULTIVITAMIN) tablet Take 1 tablet by mouth daily.        . Red Yeast Rice Extract (RED YEAST RICE PO) Take by mouth. 600mg  daily         Allergies  Allergen Reactions  . Lovastatin     myalgias  . Sudafed (Pseudoephedrine Hcl)     Patient Active Problem List  Diagnoses  . HTN (hypertension)  . History of syncope  . Asthma  . Hypercholesterolemia  . Ocular migraine    History  Smoking status  . Never Smoker   Smokeless tobacco  . Not on file    History  Alcohol Use: Not on file    Family History  Problem Relation Age of Onset  . Mitral valve prolapse Daughter   . Heart disease Mother     Review of Systems: The patient denies any heat or cold intolerance.  No weight gain or weight loss.  The patient denies headaches or blurry vision.  There is no cough or sputum production.  The patient denies dizziness.  There is no  hematuria or hematochezia.  The patient denies any muscle aches or arthritis.  The patient denies any rash.  The patient denies frequent falling or instability.  There is no history of depression or anxiety.  All other systems were reviewed and are negative.   Physical Exam: Filed Vitals:   12/12/11 0918  BP: 144/65  Pulse: 65   the general appearance reveals a well-developed well-nourished woman in no distress.The head and neck exam reveals pupils equal and reactive.  Extraocular movements are full.  There is no scleral icterus.  The mouth and pharynx are normal.  The neck is supple.  The carotids reveal no bruits.  The jugular venous pressure is normal.  The  thyroid is not enlarged.  There is no lymphadenopathy.  The chest is clear to percussion and auscultation.  There are no rales or rhonchi.  Expansion of the chest is symmetrical.  The precordium is quiet.  The first heart sound is normal.  The second heart sound is physiologically split.  There is no murmur gallop rub or click.  There is no abnormal lift or heave.  The abdomen is soft and nontender.  The bowel sounds are normal.  The liver and spleen are not enlarged.  There are no abdominal masses.  There are no abdominal bruits.  Extremities reveal good pedal pulses.  There is no phlebitis or edema.  There is no cyanosis or clubbing.  Strength is normal and symmetrical in all extremities.  There is no lateralizing weakness.  There are no sensory deficits.  The skin is warm and dry.  There is no rash.  EKG shows normal sinus rhythm and is within normal limits   Assessment / Plan: The patient is to continue same medication.  She mentioned that she has seen a neurologist regarding ocular migraines and is scheduled to have a carotid Doppler exams soon. The patient will return here in 6 months for followup office visit and fasting lab work.

## 2011-12-13 ENCOUNTER — Ambulatory Visit
Admission: RE | Admit: 2011-12-13 | Discharge: 2011-12-13 | Disposition: A | Discharge status: Home / Self Care | Payer: Managed Care, Other (non HMO) | Source: Ambulatory Visit | Attending: Neurology | Admitting: Neurology

## 2011-12-13 DIAGNOSIS — H539 Unspecified visual disturbance: Secondary | ICD-10-CM

## 2011-12-14 ENCOUNTER — Telehealth: Payer: Self-pay | Admitting: *Deleted

## 2011-12-14 NOTE — Telephone Encounter (Signed)
Mailed copy of labs and left message to call if any questions  

## 2011-12-14 NOTE — Telephone Encounter (Signed)
Message copied by Burnell Blanks on Wed Dec 14, 2011  2:43 PM ------      Message from: Cassell Clement      Created: Mon Dec 12, 2011  7:54 PM       Please report to patient.  The recent labs are stable. Continue same medication and careful diet.  LDL is still slightly high. Continue regular exercise and strict diet.

## 2012-01-02 ENCOUNTER — Encounter: Payer: Self-pay | Admitting: Family Medicine

## 2012-04-03 ENCOUNTER — Other Ambulatory Visit: Payer: Self-pay | Admitting: Family Medicine

## 2012-04-03 ENCOUNTER — Other Ambulatory Visit: Payer: Self-pay | Admitting: Cardiology

## 2012-04-04 NOTE — Telephone Encounter (Signed)
Refilled metoprolol 

## 2012-05-09 ENCOUNTER — Ambulatory Visit (INDEPENDENT_AMBULATORY_CARE_PROVIDER_SITE_OTHER): Payer: Managed Care, Other (non HMO) | Admitting: Family Medicine

## 2012-05-09 VITALS — BP 157/73 | HR 79 | Temp 98.2°F | Resp 17 | Ht 64.0 in | Wt 172.0 lb

## 2012-05-09 DIAGNOSIS — L237 Allergic contact dermatitis due to plants, except food: Secondary | ICD-10-CM

## 2012-05-09 DIAGNOSIS — L255 Unspecified contact dermatitis due to plants, except food: Secondary | ICD-10-CM

## 2012-05-09 MED ORDER — PREDNISONE 20 MG PO TABS: ORAL_TABLET | ORAL | Status: AC

## 2012-05-09 NOTE — Progress Notes (Signed)
[facility] [address] [name] [address] [phone]  Date:  05/09/2012   Name:  Kerry [name]   DOB:  [birth year]   MRN:  [id number]  PCP:  Tonye Pearson, MD    Chief Complaint: Poison Ivy   History of Present Illness:  Kerry [name] is a 71 y.o. very pleasant female patient who presents with the following:  Here today with a bad poison ivy rash.  She has had PI in the past, but never this severely.  She broke out two days ago after working in her garden.  She otherwise fells fine Kerry has not other symptoms.  She has used prednisone in the past for other reasons Kerry done well.  She has a very pruritic rash especially on her arms, Kerry also on her forehead.  No SOB, no lip or tongue swelling.  No history of DM- normal glucose in April of this year.    Patient Active Problem List  Diagnosis  . HTN (hypertension)  . History of syncope  . Asthma  . Hypercholesterolemia  . Ocular migraine    Past Medical History  Diagnosis Date  . HTN (hypertension)   . History of syncope   . Asthma     AS A CHILD    No past surgical history on file.  History  Substance Use Topics  . Smoking status: Never Smoker   . Smokeless tobacco: Not on file  . Alcohol Use: Not on file    Family History  Problem Relation Age of Onset  . Mitral valve prolapse Daughter   . Heart disease Mother     Allergies  Allergen Reactions  . Lovastatin     myalgias  . Sudafed (Pseudoephedrine Hcl)     Medication list has been reviewed Kerry updated.  Current Outpatient Prescriptions on File Prior to Visit  Medication Sig Dispense Refill  . Coenzyme Q10 (CO Q 10 PO) Take by mouth daily.        Marland Kitchen Fexofenadine HCl (ALLEGRA PO) Take by mouth. Every 24 hours       . fish oil-omega-3 fatty acids 1000 MG capsule Take by mouth daily.        . metoprolol succinate (TOPROL-XL) 25 MG 24 hr tablet TAKE 1 TABLET BY MOUTH TWO TIMES A DAY  180 tablet  3  . Multiple Vitamin  (MULTIVITAMIN) tablet Take 1 tablet by mouth daily.        . Red Yeast Rice Extract (RED YEAST RICE PO) Take by mouth. 600mg  daily       . cyclobenzaprine (FLEXERIL) 10 MG tablet 1 at bedtime as needed  30 tablet  0    Review of Systems:  As per HPI- otherwise negative.   Physical Examination: Filed Vitals:   05/09/12 1021  BP: 157/73  Pulse: 79  Temp: 98.2 F (36.8 C)  Resp: 17   Filed Vitals:   05/09/12 1021  Height: 5\' 4"  (1.626 m)  Weight: 172 lb (78.019 kg)   Body mass index is 29.52 kg/(m^2). Ideal Body Weight: Weight in (lb) to have BMI = 25: 145.3   GEN: WDWN, NAD, Non-toxic, A & O x 3, overweight HEENT: Atraumatic, Normocephalic. Neck supple. No masses, No LAD.  TM Kerry oropharynx wnl, PEERL, EOMI Ears Kerry Nose: No external deformity. CV: RRR, No M/G/R. No JVD. No thrill. No extra heart sounds. PULM: CTA B, no wheezes, crackles, rhonchi. No retractions. No resp. distress. No accessory muscle  use. EXTR: No c/c/e NEURO Normal gait.  PSYCH: Normally interactive. Conversant. Not depressed or anxious appearing.  Calm demeanor.  Skin: there is a typical rhus dermatitis rash with small vesicles Kerry weeping/ crusting on both forearms, Kerry a less severe rash on her forehead.  Other small patches on her trunk.     Assessment Kerry Plan: 1. Poison ivy  predniSONE (DELTASONE) 20 MG tablet   Treat with prednisone as above.  Also went over use of benadryl Kerry/ or claritin Kerry topicals as needed.   Let me know if not better.  Wash sheets/ towels/ pets  Abbe Amsterdam, MD

## 2012-07-16 ENCOUNTER — Encounter: Payer: Self-pay | Admitting: Cardiology

## 2012-07-16 ENCOUNTER — Ambulatory Visit (INDEPENDENT_AMBULATORY_CARE_PROVIDER_SITE_OTHER): Payer: Managed Care, Other (non HMO) | Admitting: Cardiology

## 2012-07-16 VITALS — BP 164/72 | HR 64 | Ht 63.0 in | Wt 171.0 lb

## 2012-07-16 DIAGNOSIS — I10 Essential (primary) hypertension: Secondary | ICD-10-CM

## 2012-07-16 DIAGNOSIS — R0989 Other specified symptoms and signs involving the circulatory and respiratory systems: Secondary | ICD-10-CM | POA: Insufficient documentation

## 2012-07-16 DIAGNOSIS — E7800 Pure hypercholesterolemia, unspecified: Secondary | ICD-10-CM

## 2012-07-16 DIAGNOSIS — Z9189 Other specified personal risk factors, not elsewhere classified: Secondary | ICD-10-CM

## 2012-07-16 DIAGNOSIS — Z87898 Personal history of other specified conditions: Secondary | ICD-10-CM

## 2012-07-16 DIAGNOSIS — I119 Hypertensive heart disease without heart failure: Secondary | ICD-10-CM

## 2012-07-16 LAB — BASIC METABOLIC PANEL
Chloride: 106 mEq/L (ref 96–112)
Creatinine, Ser: 0.5 mg/dL (ref 0.4–1.2)
GFR: 120.99 mL/min (ref >60.00)
Potassium: 4.1 mEq/L (ref 3.5–5.1)

## 2012-07-16 LAB — LIPID PANEL
LDL Cholesterol: 125 mg/dL — ABNORMAL HIGH (ref 0–99)
Total CHOL/HDL Ratio: 4
Triglycerides: 115 mg/dL (ref 0.0–149.0)
VLDL: 23 mg/dL (ref 0.0–40.0)

## 2012-07-16 LAB — HEPATIC FUNCTION PANEL
ALT: 17 U/L (ref 0–35)
AST: 22 U/L (ref 0–37)
Bilirubin, Direct: 0 mg/dL (ref 0.0–0.3)
Total Bilirubin: 0.4 mg/dL (ref 0.3–1.2)

## 2012-07-16 NOTE — Progress Notes (Signed)
Kerry [name] Date of Birth:  1941-05-27 [facility] [address] [place], [name]  [address] [phone]         Fax   [phone]  History of Present Illness: This pleasant 71 year old woman is seen for a scheduled followup office visit. She has a past history of essential hypertension and a remote history of syncope. She does not have any history of ischemic heart disease. She had a normal nuclear stress test in February 2010. He has a past history of hypercholesterolemia. She cannot take statins because of severe myalgias. She does take red yeast rice.   Current Outpatient Prescriptions  Medication Sig Dispense Refill  . Coenzyme Q10 (CO Q 10 PO) Take by mouth daily.        Marland Kitchen Fexofenadine HCl (ALLEGRA PO) Take by mouth as needed. Every 24 hours      . fish oil-omega-3 fatty acids 1000 MG capsule Take by mouth daily.        . metoprolol succinate (TOPROL-XL) 25 MG 24 hr tablet TAKE 1 TABLET BY MOUTH TWO TIMES A DAY  180 tablet  3  . Multiple Vitamin (MULTIVITAMIN) tablet Take 1 tablet by mouth daily.        . Red Yeast Rice Extract (RED YEAST RICE PO) Take by mouth. 600mg  daily         Allergies  Allergen Reactions  . Lovastatin     myalgias  . Sudafed (Pseudoephedrine Hcl)     Patient Active Problem List  Diagnosis  . HTN (hypertension)  . History of syncope  . Asthma  . Hypercholesterolemia  . Ocular migraine  . Right carotid bruit    History  Smoking status  . Never Smoker   Smokeless tobacco  . Not on file    History  Alcohol Use: Not on file    Family History  Problem Relation Age of Onset  . Mitral valve prolapse Daughter   . Heart disease Mother     Review of Systems: Constitutional: no fever chills diaphoresis or fatigue or change in weight.  Head and neck: no hearing loss, no epistaxis, no photophobia or visual disturbance. Respiratory: No cough, shortness of breath or wheezing. Cardiovascular: No chest pain peripheral  edema, palpitations. Gastrointestinal: No abdominal distention, no abdominal pain, no change in bowel habits hematochezia or melena. Genitourinary: No dysuria, no frequency, no urgency, no nocturia. Musculoskeletal:No arthralgias, no back pain, no gait disturbance or myalgias. Neurological: No dizziness, no headaches, no numbness, no seizures, no syncope, no weakness, no tremors. Hematologic: No lymphadenopathy, no easy bruising. Psychiatric: No confusion, no hallucinations, no sleep disturbance.    Physical Exam: Filed Vitals:   07/16/12 0902  BP: 164/72  Pulse: 64   the general appearance reveals a well-developed well-nourished woman in no distress.The head and neck exam reveals pupils equal and reactive.  Extraocular movements are full.  There is no scleral icterus.  The mouth and pharynx are normal.  The neck is supple.  The carotids reveal a soft right carotid bruit. The jugular venous pressure is normal.  The  thyroid is not enlarged.  There is no lymphadenopathy.  The chest is clear to percussion and auscultation.  There are no rales or rhonchi.  Expansion of the chest is symmetrical.  The precordium is quiet.  The first heart sound is normal.  The second heart sound is physiologically split.  There is no murmur gallop rub or click.  There is no abnormal lift or heave.  The abdomen is soft and nontender.  The bowel sounds are normal.  The liver and spleen are not enlarged.  There are no abdominal masses.  There are no abdominal bruits.  Extremities reveal good pedal pulses.  There is no phlebitis or edema.  There is no cyanosis or clubbing.  Strength is normal and symmetrical in all extremities.  There is no lateralizing weakness.  There are no sensory deficits.  The skin is warm and dry.  There is no rash.     Assessment / Plan:  Continue same medication.  Recheck in 6 months for followup office visit EKG lipid panel hepatic function panel basal metabolic panel.

## 2012-07-16 NOTE — Assessment & Plan Note (Addendum)
She has a right carotid bruit.  She states that she had carotid Dopplers in her neurologic doctor's office.  She does not recall hearing about the result and she will check with her neurologist about the results.  She denies any symptoms of TIA or stroke

## 2012-07-16 NOTE — Assessment & Plan Note (Signed)
She has had no recurrent syncopal episodes.  She is not experiencing any dizzy spells to

## 2012-07-16 NOTE — Progress Notes (Signed)
Quick Note:  Please report to patient. The recent labs are stable. Continue same medication and careful diet. LDL slightly higher. Watch diet. CSD ______

## 2012-07-16 NOTE — Assessment & Plan Note (Signed)
The patient has not been experiencing any flareups of asthma

## 2012-07-16 NOTE — Assessment & Plan Note (Signed)
Blood pressure has been remaining stable on current therapy.  She walks 30 minutes to 45 minutes each day.  She still works full time as well.

## 2012-07-16 NOTE — Patient Instructions (Addendum)
Will obtain labs today and call you with the results (lp/bmet/hfp)  Your physician wants you to follow-up in: 6 months with fasting labs (lp/bmet/hfp)  You will receive a reminder letter in the mail two months in advance. If you don't receive a letter, please call our office to schedule the follow-up appointment.   Your physician recommends that you continue on your current medications as directed. Please refer to the Current Medication list given to you today.   

## 2012-07-17 ENCOUNTER — Telehealth: Payer: Self-pay | Admitting: *Deleted

## 2012-07-17 NOTE — Telephone Encounter (Signed)
Message copied by Burnell Blanks on Tue Jul 17, 2012  2:20 PM ------      Message from: Cassell Clement      Created: Mon Jul 16, 2012  8:33 PM       Please report to patient.  The recent labs are stable. Continue same medication and careful diet. LDL slightly higher.  Watch diet.  CSD

## 2012-07-17 NOTE — Telephone Encounter (Signed)
Mailed copy of labs and left message to call if any questions  

## 2013-02-07 ENCOUNTER — Encounter: Payer: Self-pay | Admitting: Cardiology

## 2013-02-07 ENCOUNTER — Ambulatory Visit (INDEPENDENT_AMBULATORY_CARE_PROVIDER_SITE_OTHER): Payer: Managed Care, Other (non HMO) | Admitting: Cardiology

## 2013-02-07 ENCOUNTER — Other Ambulatory Visit (INDEPENDENT_AMBULATORY_CARE_PROVIDER_SITE_OTHER): Payer: Managed Care, Other (non HMO)

## 2013-02-07 VITALS — BP 142/76 | HR 57 | Ht 63.5 in | Wt 172.6 lb

## 2013-02-07 DIAGNOSIS — E041 Nontoxic single thyroid nodule: Secondary | ICD-10-CM

## 2013-02-07 DIAGNOSIS — E7800 Pure hypercholesterolemia, unspecified: Secondary | ICD-10-CM

## 2013-02-07 DIAGNOSIS — I119 Hypertensive heart disease without heart failure: Secondary | ICD-10-CM

## 2013-02-07 DIAGNOSIS — I10 Essential (primary) hypertension: Secondary | ICD-10-CM

## 2013-02-07 LAB — BASIC METABOLIC PANEL
BUN: 18 mg/dL (ref 6–23)
CO2: 26 mEq/L (ref 19–32)
Calcium: 9.1 mg/dL (ref 8.4–10.5)
Creatinine, Ser: 0.5 mg/dL (ref 0.4–1.2)
GFR: 120.79 mL/min (ref >60.00)
Glucose, Bld: 84 mg/dL (ref 70–99)
Sodium: 139 mEq/L (ref 135–145)

## 2013-02-07 LAB — HEPATIC FUNCTION PANEL
ALT: 23 U/L (ref 0–35)
AST: 25 U/L (ref 0–37)
Alkaline Phosphatase: 86 U/L (ref 39–117)
Total Bilirubin: 0.7 mg/dL (ref 0.3–1.2)

## 2013-02-07 LAB — LIPID PANEL: HDL: 61.2 mg/dL (ref >39.00)

## 2013-02-07 NOTE — Patient Instructions (Addendum)
Your physician recommends that you continue on your current medications as directed. Please refer to the Current Medication list given to you today.  Your physician wants you to follow-up in: 6 months with fasting labs (lp/bmet/hfp)  You will receive a reminder letter in the mail two months in advance. If you don't receive a letter, please call our office to schedule the follow-up appointment.   Have arranged a thyroid ultrasound for next Tuesday 02/12/13 at 1:30 [facility]) [phone]

## 2013-02-07 NOTE — Assessment & Plan Note (Signed)
She has been taking daily Allegra and has had no recurrence of her asthma symptoms

## 2013-02-07 NOTE — Progress Notes (Signed)
Quick Note:  Please report to patient. The recent labs are stable. Continue same medication and careful diet. ______ 

## 2013-02-07 NOTE — Progress Notes (Signed)
Kerry [name] Date of Birth:  [birth year] [facility] [address] [place], [name]  [address] [phone]         Fax   [phone]  History of Present Illness: This pleasant 72 year old woman is seen for a scheduled followup office visit. She has a past history of essential hypertension and a remote history of syncope. She does not have any history of ischemic heart disease. She had a normal nuclear stress test in February 2010. He has a past history of hypercholesterolemia. She cannot take statins because of severe myalgias. She does take red yeast rice.  She had a questionable TIA in April 2013 and had an ultrasound carotid duplex done at [facility] imaging no flow obstructing lesions of her carotids but did demonstrate an incidental finding of a 2.3 cm mixed echogenic solid nodule within the right lobe of the thyroid.   Current Outpatient Prescriptions  Medication Sig Dispense Refill  . Coenzyme Q10 (CO Q 10 PO) Take by mouth daily.        Marland Kitchen Fexofenadine HCl (ALLEGRA PO) Take by mouth as needed. Every 24 hours      . fish oil-omega-3 fatty acids 1000 MG capsule Take by mouth daily.        . metoprolol succinate (TOPROL-XL) 25 MG 24 hr tablet TAKE 1 TABLET BY MOUTH TWO TIMES A DAY  180 tablet  3  . Multiple Vitamin (MULTIVITAMIN) tablet Take 1 tablet by mouth daily.        . Red Yeast Rice Extract (RED YEAST RICE PO) Take by mouth. 600mg  daily        No current facility-administered medications for this visit.    Allergies  Allergen Reactions  . Lovastatin     myalgias  . Sudafed (Pseudoephedrine Hcl)     Patient Active Problem List   Diagnosis Date Noted  . Right thyroid nodule 02/07/2013  . Right carotid bruit 07/16/2012  . Ocular migraine 11/04/2011  . Hypercholesterolemia 06/01/2011  . HTN (hypertension)   . History of syncope   . Asthma     History  Smoking status  . Never Smoker   Smokeless tobacco  . Not on file    History    Alcohol Use: Not on file    Family History  Problem Relation Age of Onset  . Mitral valve prolapse Daughter   . Heart disease Mother     Review of Systems: Constitutional: no fever chills diaphoresis or fatigue or change in weight.  Head and neck: no hearing loss, no epistaxis, no photophobia or visual disturbance. Respiratory: No cough, shortness of breath or wheezing. Cardiovascular: No chest pain peripheral edema, palpitations. Gastrointestinal: No abdominal distention, no abdominal pain, no change in bowel habits hematochezia or melena. Genitourinary: No dysuria, no frequency, no urgency, no nocturia. Musculoskeletal:No arthralgias, no back pain, no gait disturbance or myalgias. Neurological: No dizziness, no headaches, no numbness, no seizures, no syncope, no weakness, no tremors. Hematologic: No lymphadenopathy, no easy bruising. Psychiatric: No confusion, no hallucinations, no sleep disturbance.    Physical Exam: Filed Vitals:   02/07/13 0830  BP: 142/76  Pulse: 57   the general appearance reveals a well-developed well-nourished woman in no distress.The head and neck exam reveals pupils equal and reactive.  Extraocular movements are full.  There is no scleral icterus.  The mouth and pharynx are normal.  The neck is supple.  The carotids reveal no bruits.  The jugular venous pressure is normal.  The  thyroid is not enlarged.  I cannot feel any definite thyroid nodule.  There is no lymphadenopathy.  The chest is clear to percussion and auscultation.  There are no rales or rhonchi.  Expansion of the chest is symmetrical.  The precordium is quiet.  The first heart sound is normal.  The second heart sound is physiologically split.  There is no murmur gallop rub or click.  There is no abnormal lift or heave.  The abdomen is soft and nontender.  The bowel sounds are normal.  The liver and spleen are not enlarged.  There are no abdominal masses.  There are no abdominal bruits.   Extremities reveal good pedal pulses.  There is no phlebitis or edema.  There is no cyanosis or clubbing.  Strength is normal and symmetrical in all extremities.  There is no lateralizing weakness.  There are no sensory deficits.  The skin is warm and dry.  There is no rash.  EKG shows sinus bradycardia and left axis deviation and voltage criteria for LVH.  No significant change since 01/02/12   Assessment / Plan: Continue same medication.  Get dedicated thyroid ultrasound. Fasting lab work pending today. Recheck in 6 months for followup office visit lipid panel hepatic function panel and basal metabolic panel

## 2013-02-07 NOTE — Assessment & Plan Note (Signed)
Blood pressure has been remaining stable on current therapy.  No chest pain or shortness of breath.  No dizziness or syncope.  No palpitations.

## 2013-02-07 NOTE — Assessment & Plan Note (Signed)
The patient reports having been on thyroid replacement therapy for a short period years ago but not since.  She was not aware that she had a thyroid nodule.  We will get a dedicated thyroid ultrasound to followup.  If abnormal we will refer her to endocrinology.

## 2013-02-07 NOTE — Assessment & Plan Note (Signed)
We're checking lab work today

## 2013-02-08 ENCOUNTER — Telehealth: Payer: Self-pay | Admitting: *Deleted

## 2013-02-08 NOTE — Telephone Encounter (Signed)
Message copied by Burnell Blanks on Fri Feb 08, 2013  4:39 PM ------      Message from: Cassell Clement      Created: Thu Feb 07, 2013  5:47 PM       Please report to patient.  The recent labs are stable. Continue same medication and careful diet. ------

## 2013-02-08 NOTE — Telephone Encounter (Signed)
Mailed copy of labs and left message to call if any questions  

## 2013-02-12 ENCOUNTER — Ambulatory Visit
Admission: RE | Admit: 2013-02-12 | Discharge: 2013-02-12 | Disposition: A | Discharge status: Home / Self Care | Payer: Managed Care, Other (non HMO) | Source: Ambulatory Visit | Attending: Cardiology | Admitting: Cardiology

## 2013-02-12 DIAGNOSIS — E041 Nontoxic single thyroid nodule: Secondary | ICD-10-CM

## 2013-02-13 ENCOUNTER — Telehealth: Payer: Self-pay | Admitting: Cardiology

## 2013-02-14 NOTE — Telephone Encounter (Signed)
New Problem  Pt is calling regarding some lab results.

## 2013-02-14 NOTE — Telephone Encounter (Signed)
Advised patient   Notes Recorded by Cassell Clement, MD on 02/12/2013 at 8:53 PM Please report. [place] of thyroid shows multinodular goiter. Not much change since 2013. Continue same meds.

## 2013-04-02 ENCOUNTER — Other Ambulatory Visit: Payer: Self-pay | Admitting: Cardiology

## 2013-08-09 ENCOUNTER — Ambulatory Visit: Payer: Managed Care, Other (non HMO) | Admitting: Cardiology

## 2013-08-16 ENCOUNTER — Encounter: Payer: Self-pay | Admitting: Cardiology

## 2013-08-16 ENCOUNTER — Ambulatory Visit (INDEPENDENT_AMBULATORY_CARE_PROVIDER_SITE_OTHER): Payer: Managed Care, Other (non HMO) | Admitting: Cardiology

## 2013-08-16 ENCOUNTER — Telehealth: Payer: Self-pay | Admitting: Cardiology

## 2013-08-16 VITALS — BP 146/72 | HR 61 | Ht 63.5 in | Wt 175.0 lb

## 2013-08-16 DIAGNOSIS — I10 Essential (primary) hypertension: Secondary | ICD-10-CM

## 2013-08-16 DIAGNOSIS — I119 Hypertensive heart disease without heart failure: Secondary | ICD-10-CM

## 2013-08-16 DIAGNOSIS — E041 Nontoxic single thyroid nodule: Secondary | ICD-10-CM

## 2013-08-16 DIAGNOSIS — R0989 Other specified symptoms and signs involving the circulatory and respiratory systems: Secondary | ICD-10-CM

## 2013-08-16 DIAGNOSIS — E7800 Pure hypercholesterolemia, unspecified: Secondary | ICD-10-CM

## 2013-08-16 LAB — BASIC METABOLIC PANEL
BUN: 12 mg/dL (ref 6–23)
CO2: 27 mEq/L (ref 19–32)
Calcium: 9.2 mg/dL (ref 8.4–10.5)
GFR: 115.57 mL/min (ref >60.00)
Glucose, Bld: 87 mg/dL (ref 70–99)

## 2013-08-16 LAB — HEPATIC FUNCTION PANEL
ALT: 18 U/L (ref 0–35)
AST: 21 U/L (ref 0–37)
Alkaline Phosphatase: 89 U/L (ref 39–117)
Bilirubin, Direct: 0 mg/dL (ref 0.0–0.3)
Total Bilirubin: 0.7 mg/dL (ref 0.3–1.2)

## 2013-08-16 LAB — LIPID PANEL: Total CHOL/HDL Ratio: 3

## 2013-08-16 NOTE — Assessment & Plan Note (Signed)
The patient monitors her blood pressure at home and it is usually in the normal range.  Today it is slightly elevated at 146/72 on repeat.  She is not on any blood pressure medicines except for a beta blocker.  Her weight is up 3 pounds since last visit.  She will work harder on weight loss and salt restriction.  Continue same medication.

## 2013-08-16 NOTE — Assessment & Plan Note (Addendum)
The patient has not had any further TIA symptoms.  We will check to be sure that she is taking a baby aspirin.

## 2013-08-16 NOTE — Telephone Encounter (Signed)
Follow Up ° °Pt returned call//  °

## 2013-08-16 NOTE — Patient Instructions (Signed)
Will obtain labs today and call you with the results (lp/bmet/hfp)  Your physician recommends that you continue on your current medications as directed. Please refer to the Current Medication list given to you today.  Your physician wants you to follow-up in: 6 months with fasting labs (lp/bmet/hfp/tsh/ft4)  You will receive a reminder letter in the mail two months in advance. If you don't receive a letter, please call our office to schedule the follow-up appointment.

## 2013-08-16 NOTE — Progress Notes (Signed)
Kerry [name] Date of Birth:  1941/04/01 [address] [facility] [address] [place], [name]  [address] [phone]         Fax   [phone]  History of Present Illness: This pleasant 72 year old woman is seen for a scheduled followup office visit. She has a past history of essential hypertension and a remote history of syncope. She does not have any history of ischemic heart disease. She had a normal nuclear stress test in February 2010. He has a past history of hypercholesterolemia. She cannot take statins because of severe myalgias. She does take red yeast rice.  She had a questionable TIA in April 2013 and had an ultrasound carotid duplex done at [facility] imaging no flow obstructing lesions of her carotids but did demonstrate an incidental finding of a 2.3 cm mixed echogenic solid nodule within the right lobe of the thyroid.  She is not having any symptoms of hypothyroidism.  She is not presently on any thyroid replacement hormone.   Current Outpatient Prescriptions  Medication Sig Dispense Refill  . Coenzyme Q10 (CO Q 10 PO) Take by mouth daily.        Marland Kitchen Fexofenadine HCl (ALLEGRA PO) Take by mouth as needed. Every 24 hours      . fish oil-omega-3 fatty acids 1000 MG capsule Take by mouth daily.        . metoprolol succinate (TOPROL-XL) 25 MG 24 hr tablet TAKE 1 TABLET BY MOUTH TWO TIMES A DAY  180 tablet  3  . Multiple Vitamin (MULTIVITAMIN) tablet Take 1 tablet by mouth daily.        . Red Yeast Rice Extract (RED YEAST RICE PO) Take by mouth. 600mg  daily        No current facility-administered medications for this visit.    Allergies  Allergen Reactions  . Lovastatin     myalgias  . Sudafed [Pseudoephedrine Hcl]     Patient Active Problem List   Diagnosis Date Noted  . Right thyroid nodule 02/07/2013  . Right carotid bruit 07/16/2012  . Ocular migraine 11/04/2011  . Hypercholesterolemia 06/01/2011  . HTN (hypertension)   . History of syncope   . Asthma     History    Smoking status  . Never Smoker   Smokeless tobacco  . Not on file    History  Alcohol Use No    Family History  Problem Relation Age of Onset  . Mitral valve prolapse Daughter   . Heart disease Mother     Review of Systems: Constitutional: no fever chills diaphoresis or fatigue or change in weight.  Head and neck: no hearing loss, no epistaxis, no photophobia or visual disturbance. Respiratory: No cough, shortness of breath or wheezing. Cardiovascular: No chest pain peripheral edema, palpitations. Gastrointestinal: No abdominal distention, no abdominal pain, no change in bowel habits hematochezia or melena. Genitourinary: No dysuria, no frequency, no urgency, no nocturia. Musculoskeletal:No arthralgias, no back pain, no gait disturbance or myalgias. Neurological: No dizziness, no headaches, no numbness, no seizures, no syncope, no weakness, no tremors. Hematologic: No lymphadenopathy, no easy bruising. Psychiatric: No confusion, no hallucinations, no sleep disturbance.    Physical Exam: Filed Vitals:   08/16/13 1007  BP: 146/72  Pulse:    the general appearance reveals a well-developed well-nourished woman in no distress.The head and neck exam reveals pupils equal and reactive.  Extraocular movements are full.  There is no scleral icterus.  The mouth and pharynx are normal.  The neck is supple.  The carotids reveal no bruits.  The jugular venous pressure is normal.  The  thyroid is not enlarged.  I cannot feel any definite thyroid nodule.  There is no lymphadenopathy.  The chest is clear to percussion and auscultation.  There are no rales or rhonchi.  Expansion of the chest is symmetrical.  The precordium is quiet.  The first heart sound is normal.  The second heart sound is physiologically split.  There is no murmur gallop rub or click.  There is no abnormal lift or heave.  The abdomen is soft and nontender.  The bowel sounds are normal.  The liver and spleen are not enlarged.   There are no abdominal masses.  There are no abdominal bruits.  Extremities reveal good pedal pulses.  There is no phlebitis or edema.  There is no cyanosis or clubbing.  Strength is normal and symmetrical in all extremities.  There is no lateralizing weakness.  There are no sensory deficits.  The skin is warm and dry.  There is no rash.     Assessment / Plan: Continue same medication.  She has minimal carotid artery plaque and she should be on a baby aspirin daily unless she has contraindications. Fasting lab work pending today. Recheck in 6 months for followup office visit lipid panel hepatic function panel and basal metabolic panel

## 2013-08-16 NOTE — Assessment & Plan Note (Signed)
She is not having any symptoms from her nontoxic nodular goiter on the right.  No difficulty with swallowing.  No symptoms of thyrotoxicosis.

## 2013-08-16 NOTE — Assessment & Plan Note (Signed)
Patient has a history of hypercholesterolemia and is intolerant of statins but is able to take red yeast rice.  We are checking lab work today

## 2013-08-16 NOTE — Telephone Encounter (Signed)
Dr. Patty Sermons wanted to make sure patient taking ASA 81 mg daily after today's office visit. Advised patient

## 2013-08-18 NOTE — Progress Notes (Signed)
Quick Note:  Please report to patient. The recent labs are stable. Continue same medication and careful diet. ______ 

## 2013-08-20 ENCOUNTER — Telehealth: Payer: Self-pay | Admitting: *Deleted

## 2013-08-20 NOTE — Telephone Encounter (Signed)
Message copied by Burnell Blanks on Tue Aug 20, 2013  9:17 AM ------      Message from: Cassell Clement      Created: Sun Aug 18, 2013  4:31 PM       Please report to patient.  The recent labs are stable. Continue same medication and careful diet. ------

## 2013-08-20 NOTE — Telephone Encounter (Signed)
Mailed copy of labs and left message to call if any questions  

## 2014-01-16 ENCOUNTER — Encounter: Payer: Self-pay | Admitting: Cardiology

## 2014-01-16 ENCOUNTER — Ambulatory Visit (INDEPENDENT_AMBULATORY_CARE_PROVIDER_SITE_OTHER): Payer: Managed Care, Other (non HMO) | Admitting: Cardiology

## 2014-01-16 VITALS — BP 144/62 | HR 64 | Ht 63.5 in | Wt 176.0 lb

## 2014-01-16 DIAGNOSIS — Z87898 Personal history of other specified conditions: Secondary | ICD-10-CM

## 2014-01-16 DIAGNOSIS — I10 Essential (primary) hypertension: Secondary | ICD-10-CM

## 2014-01-16 DIAGNOSIS — Z9189 Other specified personal risk factors, not elsewhere classified: Secondary | ICD-10-CM

## 2014-01-16 DIAGNOSIS — E7800 Pure hypercholesterolemia, unspecified: Secondary | ICD-10-CM

## 2014-01-16 DIAGNOSIS — R0989 Other specified symptoms and signs involving the circulatory and respiratory systems: Secondary | ICD-10-CM

## 2014-01-16 DIAGNOSIS — E041 Nontoxic single thyroid nodule: Secondary | ICD-10-CM

## 2014-01-16 DIAGNOSIS — I119 Hypertensive heart disease without heart failure: Secondary | ICD-10-CM

## 2014-01-16 LAB — HEPATIC FUNCTION PANEL
ALBUMIN: 3.9 g/dL (ref 3.5–5.2)
ALT: 16 U/L (ref 0–35)
AST: 24 U/L (ref 0–37)
Alkaline Phosphatase: 79 U/L (ref 39–117)
BILIRUBIN TOTAL: 0.9 mg/dL (ref 0.2–1.2)
Bilirubin, Direct: 0 mg/dL (ref 0.0–0.3)
Total Protein: 7.2 g/dL (ref 6.0–8.3)

## 2014-01-16 LAB — T4, FREE: Free T4: 0.85 ng/dL (ref 0.60–1.60)

## 2014-01-16 LAB — TSH: TSH: 1.92 u[IU]/mL (ref 0.35–4.50)

## 2014-01-16 LAB — BASIC METABOLIC PANEL
BUN: 13 mg/dL (ref 6–23)
CO2: 27 meq/L (ref 19–32)
Calcium: 9.2 mg/dL (ref 8.4–10.5)
Chloride: 106 mEq/L (ref 96–112)
Creatinine, Ser: 0.6 mg/dL (ref 0.4–1.2)
GFR: 96.91 mL/min (ref >60.00)
GLUCOSE: 86 mg/dL (ref 70–99)
POTASSIUM: 3.9 meq/L (ref 3.5–5.1)
Sodium: 138 mEq/L (ref 135–145)

## 2014-01-16 LAB — LIPID PANEL
CHOL/HDL RATIO: 3
Cholesterol: 205 mg/dL — ABNORMAL HIGH (ref 0–200)
HDL: 60.7 mg/dL (ref >39.00)
LDL Cholesterol: 128 mg/dL — ABNORMAL HIGH (ref 0–99)
Triglycerides: 80 mg/dL (ref 0.0–149.0)
VLDL: 16 mg/dL (ref 0.0–40.0)

## 2014-01-16 NOTE — Patient Instructions (Signed)
Will obtain labs today and call you with the results (lp/bmet/hfptsh/ft4)  Your physician recommends that you continue on your current medications as directed. Please refer to the Current Medication list given to you today.  Your physician wants you to follow-up in: 6 months with fasting labs (lp/bmet/hfp)  You will receive a reminder letter in the mail two months in advance. If you don't receive a letter, please call our office to schedule the follow-up appointment.

## 2014-01-16 NOTE — Assessment & Plan Note (Signed)
She has a history of hypercholesterolemia but cannot take statins.  We are checking lab work today

## 2014-01-16 NOTE — Assessment & Plan Note (Signed)
She has not been having any recent flareup of her asthma

## 2014-01-16 NOTE — Progress Notes (Signed)
Quick Note:  Please report to patient. The recent labs are stable. Continue same medication and careful diet. ______ 

## 2014-01-16 NOTE — Assessment & Plan Note (Addendum)
Blood pressure was remaining stable at home on current medication.  No headaches dizziness or syncope.  No CHF symptoms.

## 2014-01-16 NOTE — Progress Notes (Signed)
Kerry [name] Date of Birth:  1941-01-11 [place] [address] [place],   [address] [phone]        Fax   [phone]   History of Present Illness: This pleasant 73 year old woman is seen for a scheduled followup office visit. She has a past history of essential hypertension and a remote history of syncope. She does not have any history of ischemic heart disease. She had a normal nuclear stress test in February 2010. He has a past history of hypercholesterolemia. She cannot take statins because of severe myalgias. She does take red yeast rice. She had a questionable TIA in April 2013 and had an ultrasound carotid duplex done at [facility] imaging no flow obstructing lesions of her carotids but did demonstrate an incidental finding of a 2.3 cm mixed echogenic solid nodule within the right lobe of the thyroid. She is not having any symptoms of hypothyroidism. She is not presently on any thyroid replacement hormone.  Since last visit she's had no new cardiac symptoms.  He has had a mild cough which she choose to seasonal allergies and which is relieved by Hall's lozenges.  Current Outpatient Prescriptions  Medication Sig Dispense Refill  . aspirin 81 MG tablet Take 81 mg by mouth daily.      . Coenzyme Q10 (CO Q 10 PO) Take by mouth daily.        Marland Kitchen Fexofenadine HCl (ALLEGRA PO) Take by mouth as needed. Every 24 hours      . fish oil-omega-3 fatty acids 1000 MG capsule Take by mouth daily.        . metoprolol succinate (TOPROL-XL) 25 MG 24 hr tablet TAKE 1 TABLET BY MOUTH TWO TIMES A DAY  180 tablet  3  . Multiple Vitamin (MULTIVITAMIN) tablet Take 1 tablet by mouth daily.        . Red Yeast Rice Extract (RED YEAST RICE PO) Take by mouth. 600mg  daily        No current facility-administered medications for this visit.    Allergies  Allergen Reactions  . Lovastatin     myalgias  . Sudafed [Pseudoephedrine Hcl]     Patient Active Problem List   Diagnosis Date Noted  . Right thyroid nodule 02/07/2013  . Right carotid bruit 07/16/2012  . Ocular migraine 11/04/2011  . Hypercholesterolemia 06/01/2011  . HTN (hypertension)   . History of syncope   . Asthma     History  Smoking status  . Never Smoker   Smokeless tobacco  . Not on file    History  Alcohol Use No    Family History  Problem Relation Age of Onset  . Mitral valve prolapse Daughter   . Heart disease Mother     Review of Systems: Constitutional: no fever chills diaphoresis or fatigue or change in weight.  Head and neck: no hearing loss, no epistaxis, no photophobia or visual disturbance. Respiratory: No cough, shortness of breath or wheezing. Cardiovascular: No chest pain peripheral edema, palpitations. Gastrointestinal: No abdominal distention, no abdominal pain, no change in bowel habits hematochezia or melena. Genitourinary: No dysuria, no frequency, no urgency, no nocturia. Musculoskeletal:No arthralgias, no back pain, no gait disturbance or myalgias. Neurological: No dizziness, no headaches, no numbness, no seizures, no syncope, no weakness, no tremors. Hematologic: No lymphadenopathy, no easy bruising. Psychiatric: No confusion, no hallucinations, no sleep disturbance.    Physical Exam: Filed Vitals:   01/16/14 1019  BP: 144/62  Pulse: 64  the general appearance reveals a well-developed well-nourished woman in no distress.The head and neck exam reveals pupils equal and reactive.  Extraocular movements are full.  There is no scleral icterus.  The mouth and pharynx are normal.  The neck is supple.  The carotids reveal a soft right carotid bruit.  The jugular venous pressure is normal.  The  thyroid is not enlarged.  There is no lymphadenopathy.  The chest is clear to percussion and auscultation.  There are no rales or rhonchi.  Expansion of the chest is symmetrical.  The precordium is quiet.  The first heart sound is normal.  The second heart sound is  physiologically split.  There is no murmur gallop rub or click.  There is no abnormal lift or heave.  The abdomen is soft and nontender.  The bowel sounds are normal.  The liver and spleen are not enlarged.  There are no abdominal masses.  There are no abdominal bruits.  Extremities reveal good pedal pulses.  There is no phlebitis or edema.  There is no cyanosis or clubbing.  Strength is normal and symmetrical in all extremities.  There is no lateralizing weakness.  There are no sensory deficits.  The skin is warm and dry.  There is no rash.     Assessment / Plan: 1. Hypercholesterolemia 2. essential hypertension 3. soft right carotid bruit with previous carotid Dopplers showing no significant obstruction 4. remote history of syncope 5. right thyroid nodule  Plan: Continue same medication.  Recheck in 6 months for office visit lipid panel hepatic function panel and basal metabolic panel

## 2014-01-17 ENCOUNTER — Telehealth: Payer: Self-pay | Admitting: *Deleted

## 2014-01-17 NOTE — Telephone Encounter (Signed)
Message copied by Earvin Hansen on Fri Jan 17, 2014 10:41 AM ------      Message from: Darlin Coco      Created: Thu Jan 16, 2014  8:37 PM       Please report to patient.  The recent labs are stable. Continue same medication and careful diet. ------

## 2014-01-17 NOTE — Telephone Encounter (Signed)
Advised patient of lab results  

## 2014-04-02 ENCOUNTER — Other Ambulatory Visit: Payer: Self-pay | Admitting: Cardiology

## 2014-04-02 NOTE — Telephone Encounter (Signed)
Kerry Coco, MD at 01/16/2014  2:32 PM metoprolol succinate (TOPROL-XL) 25 MG 24 hr tablet  TAKE 1 TABLET BY MOUTH TWO TIMES A DAY   Your physician recommends that you continue on your current medications as directed. Please refer to the Current Medication list given to you today

## 2014-06-26 ENCOUNTER — Encounter: Payer: Self-pay | Admitting: *Deleted

## 2014-07-25 ENCOUNTER — Ambulatory Visit (INDEPENDENT_AMBULATORY_CARE_PROVIDER_SITE_OTHER): Payer: Managed Care, Other (non HMO) | Admitting: Cardiology

## 2014-07-25 ENCOUNTER — Encounter: Payer: Self-pay | Admitting: Cardiology

## 2014-07-25 VITALS — BP 150/70 | HR 75 | Ht 63.0 in | Wt 175.6 lb

## 2014-07-25 DIAGNOSIS — I10 Essential (primary) hypertension: Secondary | ICD-10-CM

## 2014-07-25 DIAGNOSIS — Z87898 Personal history of other specified conditions: Secondary | ICD-10-CM

## 2014-07-25 DIAGNOSIS — Z9189 Other specified personal risk factors, not elsewhere classified: Secondary | ICD-10-CM

## 2014-07-25 DIAGNOSIS — E7800 Pure hypercholesterolemia, unspecified: Secondary | ICD-10-CM

## 2014-07-25 DIAGNOSIS — I119 Hypertensive heart disease without heart failure: Secondary | ICD-10-CM

## 2014-07-25 DIAGNOSIS — E041 Nontoxic single thyroid nodule: Secondary | ICD-10-CM

## 2014-07-25 LAB — BASIC METABOLIC PANEL
BUN: 14 mg/dL (ref 6–23)
CO2: 24 meq/L (ref 19–32)
Calcium: 9.2 mg/dL (ref 8.4–10.5)
Chloride: 108 mEq/L (ref 96–112)
Creatinine, Ser: 0.6 mg/dL (ref 0.4–1.2)
GFR: 110.61 mL/min (ref >60.00)
Glucose, Bld: 90 mg/dL (ref 70–99)
POTASSIUM: 4.2 meq/L (ref 3.5–5.1)
SODIUM: 141 meq/L (ref 135–145)

## 2014-07-25 LAB — HEPATIC FUNCTION PANEL
ALBUMIN: 3.3 g/dL — AB (ref 3.5–5.2)
ALT: 16 U/L (ref 0–35)
AST: 20 U/L (ref 0–37)
Alkaline Phosphatase: 89 U/L (ref 39–117)
BILIRUBIN TOTAL: 0.7 mg/dL (ref 0.2–1.2)
Bilirubin, Direct: 0.1 mg/dL (ref 0.0–0.3)
Total Protein: 7.3 g/dL (ref 6.0–8.3)

## 2014-07-25 LAB — LIPID PANEL
CHOLESTEROL: 209 mg/dL — AB (ref 0–200)
HDL: 47.7 mg/dL (ref >39.00)
LDL CALC: 139 mg/dL — AB (ref 0–99)
NonHDL: 161.3
TRIGLYCERIDES: 113 mg/dL (ref 0.0–149.0)
Total CHOL/HDL Ratio: 4
VLDL: 22.6 mg/dL (ref 0.0–40.0)

## 2014-07-25 NOTE — Assessment & Plan Note (Signed)
She is clinically euthyroid.  We will plan to check a TSH next time.

## 2014-07-25 NOTE — Addendum Note (Signed)
Addended by: Alvina Filbert B on: 07/25/2014 08:46 AM   Modules accepted: Orders

## 2014-07-25 NOTE — Patient Instructions (Signed)
Will obtain labs today and call you with the results (lp/bmet/hfp)  Your physician recommends that you continue on your current medications as directed. Please refer to the Current Medication list given to you today.  Your physician wants you to follow-up in: 6 months with fasting labs (lp/bmet/hfp/tsh)and ekg   You will receive a reminder letter in the mail two months in advance. If you don't receive a letter, please call our office to schedule the follow-up appointment.

## 2014-07-25 NOTE — Addendum Note (Signed)
Addended by: Eulis Foster on: 07/25/2014 08:36 AM   Modules accepted: Orders

## 2014-07-25 NOTE — Assessment & Plan Note (Signed)
No headaches.  No dizziness.  She has no worsening of her chronic mild exertional dyspnea

## 2014-07-25 NOTE — Progress Notes (Signed)
Kerry [name] Date of Birth:  01-Feb-1941 [name], [name] [phone]        Fax   [phone]   History of Present Illness: This pleasant 73 year old woman is seen for a scheduled followup office visit. She has a past history of essential hypertension and a remote history of syncope. She does not have any history of ischemic heart disease. She had a normal nuclear stress test in February 2010. He has a past history of hypercholesterolemia. She cannot take statins because of severe myalgias. She does take red yeast rice. She had a questionable TIA in April 2013 and had an ultrasound carotid duplex done at [facility] imaging no flow obstructing lesions of her carotids but did demonstrate an incidental finding of a 2.3 cm mixed echogenic solid nodule within the right lobe of the thyroid. She is not having any symptoms of hypothyroidism. She is not presently on any thyroid replacement hormone.  Since last visit she's had no new cardiac symptoms.  He has had a mild cough which she choose to seasonal allergies and which is relieved by Hall's lozenges.since last visit she had another episode of syncope one week ago.  It occurred after she had been leaning over and then stood up and felt dizzy.  She walked 2015 and then fell to the floor.  She did not injure herself.  She has had no further symptoms.  She has had a recent cold which she attributes to her recent flu shot.  Current Outpatient Prescriptions  Medication Sig Dispense Refill  . aspirin 81 MG tablet Take 81 mg by mouth daily.    . Coenzyme Q10 (CO Q 10 PO) Take 1 capsule by mouth daily.     Marland Kitchen Fexofenadine HCl (ALLEGRA PO) Take 1 capsule by mouth as needed (allergies). Every 24 hours    . fish oil-omega-3 fatty acids 1000 MG capsule Take 1 g by mouth daily.     . metoprolol succinate (TOPROL-XL) 25 MG 24 hr tablet TAKE 1 TABLET BY MOUTH TWICE A DAY 180 tablet 2  . Multiple Vitamin  (MULTIVITAMIN) tablet Take 1 tablet by mouth daily.      . Red Yeast Rice Extract (RED YEAST RICE PO) Take 600 mg by mouth daily     No current facility-administered medications for this visit.    Allergies  Allergen Reactions  . Lovastatin Other (See Comments)    myalgias  . Sudafed [Pseudoephedrine Hcl] Other (See Comments)    Hallucinations     Patient Active Problem List   Diagnosis Date Noted  . Right thyroid nodule 02/07/2013  . Right carotid bruit 07/16/2012  . Ocular migraine 11/04/2011  . Hypercholesterolemia 06/01/2011  . HTN (hypertension)   . History of syncope   . Asthma     History  Smoking status  . Never Smoker   Smokeless tobacco  . Not on file    History  Alcohol Use No    Family History  Problem Relation Age of Onset  . Mitral valve prolapse Daughter   . Heart disease Mother     Review of Systems: Constitutional: no fever chills diaphoresis or fatigue or change in weight.  Head and neck: no hearing loss, no epistaxis, no photophobia or visual disturbance. Respiratory: No cough, shortness of breath or wheezing. Cardiovascular: No chest pain peripheral edema, palpitations. Gastrointestinal: No abdominal distention, no abdominal pain, no change in bowel habits hematochezia or melena. Genitourinary:  No dysuria, no frequency, no urgency, no nocturia. Musculoskeletal:No arthralgias, no back pain, no gait disturbance or myalgias. Neurological: No dizziness, no headaches, no numbness, no seizures, no syncope, no weakness, no tremors. Hematologic: No lymphadenopathy, no easy bruising. Psychiatric: No confusion, no hallucinations, no sleep disturbance.    Physical Exam: Filed Vitals:   07/25/14 0824  BP: 150/70  Pulse:    the general appearance reveals a well-developed well-nourished woman in no distress.The head and neck exam reveals pupils equal and reactive.  Extraocular movements are full.  There is no scleral icterus.  The mouth and pharynx  are normal.  The neck is supple.  The carotids reveal a soft right carotid bruit.  The jugular venous pressure is normal.  The  thyroid is not enlarged.  There is no lymphadenopathy.  The chest is clear to percussion and auscultation.  There are no rales or rhonchi.  Expansion of the chest is symmetrical.  The precordium is quiet.  The first heart sound is normal.  The second heart sound is physiologically split.  There is no murmur gallop rub or click.  There is no abnormal lift or heave.  The abdomen is soft and nontender.  The bowel sounds are normal.  The liver and spleen are not enlarged.  There are no abdominal masses.  There are no abdominal bruits.  Extremities reveal good pedal pulses.  There is no phlebitis or edema.  There is no cyanosis or clubbing.  Strength is normal and symmetrical in all extremities.  There is no lateralizing weakness.  There are no sensory deficits.  The skin is warm and dry.  There is no rash.     Assessment / Plan: 1. Hypercholesterolemia 2. essential hypertension 3. soft right carotid bruit with previous carotid Dopplers showing no significant obstruction 4. Past history of syncope. Recent episode of dizziness and syncope after standing up quickly after bending over. 5. right thyroid nodule  Plan: Continue same medication.  Recheck in 6 months for office visit lipid panel hepatic function panel and basal metabolic panel, EKG, and TSH.  Lab work today pending.

## 2014-07-25 NOTE — Assessment & Plan Note (Signed)
She is on red yeast Rice for her elevated cholesterol

## 2014-07-26 NOTE — Progress Notes (Signed)
Quick Note:  Please report to patient. The recent labs are stable. Continue same medication and careful diet. The LDL cholesterol is too high. I would like her to start on atorvastatin 10 mg daily. The red yeast rice is not effective enough. ______

## 2014-07-28 NOTE — Progress Notes (Signed)
Declines starting Lipitor now. Had it in the past and it made her legs feel rubbery.

## 2014-07-28 NOTE — Progress Notes (Signed)
Patient informed of labs. Does not want to start generic lipitor. States she tried it once and it made her legs feel rubbery. Will go back to the red yeast rice and give up cheese.

## 2014-07-28 NOTE — Progress Notes (Signed)
Patient notified

## 2014-12-25 ENCOUNTER — Other Ambulatory Visit: Payer: Self-pay | Admitting: Cardiology

## 2015-01-23 ENCOUNTER — Encounter: Payer: Self-pay | Admitting: Cardiology

## 2015-01-23 ENCOUNTER — Ambulatory Visit (INDEPENDENT_AMBULATORY_CARE_PROVIDER_SITE_OTHER): Payer: Managed Care, Other (non HMO) | Admitting: Cardiology

## 2015-01-23 ENCOUNTER — Other Ambulatory Visit (INDEPENDENT_AMBULATORY_CARE_PROVIDER_SITE_OTHER): Payer: Managed Care, Other (non HMO) | Admitting: *Deleted

## 2015-01-23 VITALS — BP 150/70 | HR 59 | Ht 63.0 in | Wt 176.0 lb

## 2015-01-23 DIAGNOSIS — E7800 Pure hypercholesterolemia, unspecified: Secondary | ICD-10-CM

## 2015-01-23 DIAGNOSIS — I119 Hypertensive heart disease without heart failure: Secondary | ICD-10-CM

## 2015-01-23 DIAGNOSIS — E041 Nontoxic single thyroid nodule: Secondary | ICD-10-CM

## 2015-01-23 LAB — BASIC METABOLIC PANEL
BUN: 15 mg/dL (ref 6–23)
CALCIUM: 9.4 mg/dL (ref 8.4–10.5)
CO2: 28 mEq/L (ref 19–32)
CREATININE: 0.57 mg/dL (ref 0.40–1.20)
Chloride: 104 mEq/L (ref 96–112)
GFR: 110.45 mL/min (ref >60.00)
Glucose, Bld: 85 mg/dL (ref 70–99)
Potassium: 4 mEq/L (ref 3.5–5.1)
SODIUM: 137 meq/L (ref 135–145)

## 2015-01-23 LAB — HEPATIC FUNCTION PANEL
ALBUMIN: 3.8 g/dL (ref 3.5–5.2)
ALT: 16 U/L (ref 0–35)
AST: 20 U/L (ref 0–37)
Alkaline Phosphatase: 89 U/L (ref 39–117)
Bilirubin, Direct: 0.1 mg/dL (ref 0.0–0.3)
Total Bilirubin: 0.6 mg/dL (ref 0.2–1.2)
Total Protein: 7.1 g/dL (ref 6.0–8.3)

## 2015-01-23 LAB — LIPID PANEL
CHOL/HDL RATIO: 3
CHOLESTEROL: 197 mg/dL (ref 0–200)
HDL: 65.9 mg/dL (ref >39.00)
LDL Cholesterol: 119 mg/dL — ABNORMAL HIGH (ref 0–99)
NONHDL: 131.1
Triglycerides: 60 mg/dL (ref 0.0–149.0)
VLDL: 12 mg/dL (ref 0.0–40.0)

## 2015-01-23 LAB — TSH: TSH: 2.31 u[IU]/mL (ref 0.35–4.50)

## 2015-01-23 MED ORDER — LISINOPRIL 10 MG PO TABS: 10.0000 mg | ORAL_TABLET | Freq: Every day | ORAL | Status: DC

## 2015-01-23 MED ORDER — METOPROLOL SUCCINATE ER 25 MG PO TB24: 25.0000 mg | ORAL_TABLET | Freq: Two times a day (BID) | ORAL | Status: DC

## 2015-01-23 NOTE — Progress Notes (Signed)
Cardiology Office Note   Date:  01/23/2015   ID:  Kerry [name], DOB 07/09/1941, MRN [id number]  PCP:  Leandrew Koyanagi, MD  Cardiologist: Darlin Coco MD  No chief complaint on file.     History of Present Illness: Kerry [name] is a 74 y.o. female who presents for a six-month follow-up.  This pleasant 74 year old woman is seen for a scheduled followup office visit. She has a past history of essential hypertension and a remote history of syncope. She does not have any history of ischemic heart disease. She had a normal nuclear stress test in February 2010. He has a past history of hypercholesterolemia. She cannot take statins because of severe myalgias. She does take red yeast rice. She had a questionable TIA in April 2013 and had an ultrasound carotid duplex done at [facility] imaging no flow obstructing lesions of her carotids but did demonstrate an incidental finding of a 2.3 cm mixed echogenic solid nodule within the right lobe of the thyroid. She is not having any symptoms of hypothyroidism. She is not presently on any thyroid replacement hormone. Since last visit she's had no new cardiac symptoms.She checks her blood pressure at home.  This morning at home it was 505 systolic.  Often it is in the high 130s.  The patient avoids dietary salt.   Since last visit her mother died up in [place].  She was 50 and had end-stage renal disease.  She refused dialysis.   Past Medical History  Diagnosis Date  . HTN (hypertension)   . History of syncope   . Asthma     AS A CHILD    History reviewed. No pertinent past surgical history.   Current Outpatient Prescriptions  Medication Sig Dispense Refill  . aspirin 81 MG tablet Take 81 mg by mouth daily.    . Coenzyme Q10 (CO Q 10 PO) Take 1 capsule by mouth daily.     Marland Kitchen Fexofenadine HCl (ALLEGRA PO) Take 1 capsule by mouth as needed (allergies). Every 24 hours    . fish oil-omega-3 fatty acids 1000 MG capsule Take 1 g  by mouth daily.     . metoprolol succinate (TOPROL-XL) 25 MG 24 hr tablet TAKE 1 TABLET BY MOUTH TWICE A DAY 180 tablet 0  . Multiple Vitamin (MULTIVITAMIN) tablet Take 1 tablet by mouth daily.      . Red Yeast Rice Extract (RED YEAST RICE PO) Take 600 mg by mouth daily     No current facility-administered medications for this visit.    Allergies:   Lovastatin and Sudafed    Social History:  The patient  reports that she has never smoked. She does not have any smokeless tobacco history on file. She reports that she does not drink alcohol or use illicit drugs.   Family History:  The patient's family history includes Dementia in her father; Diabetes in her mother; Heart disease in her mother; Kidney failure in her mother; Mitral valve prolapse in her daughter.    ROS:  Please see the history of present illness.   Otherwise, review of systems are positive for none.   All other systems are reviewed and negative.    PHYSICAL EXAM: VS:  BP 150/70 mmHg  Pulse 59  Ht 5\' 3"  (1.6 m)  Wt 176 lb (79.833 kg)  BMI 31.18 kg/m2 , BMI Body mass index is 31.18 kg/(m^2). GEN: Well nourished, well developed, in no acute distress HEENT: normal Neck: no JVD, there is  a right carotid bruit. Cardiac: RRR; no murmurs, rubs, or gallops,no edema  Respiratory:  clear to auscultation bilaterally, normal work of breathing GI: soft, nontender, nondistended, + BS MS: no deformity or atrophy Skin: warm and dry, no rash Neuro:  Strength and sensation are intact Psych: euthymic mood, full affect   EKG:  EKG is ordered today. The ekg ordered today demonstrates normal sinus rhythm with sinus arrhythmia.  No ischemic changes.   Recent Labs: 07/25/2014: ALT 16; BUN 14; Creatinine 0.6; Potassium 4.2; Sodium 141    Lipid Panel    Component Value Date/Time   CHOL 209* 07/25/2014 0836   TRIG 113.0 07/25/2014 0836   HDL 47.70 07/25/2014 0836   CHOLHDL 4 07/25/2014 0836   VLDL 22.6 07/25/2014 0836   LDLCALC  139* 07/25/2014 0836   LDLDIRECT 140.3 06/01/2011 1019      Wt Readings from Last 3 Encounters:  01/23/15 176 lb (79.833 kg)  07/25/14 175 lb 9.6 oz (79.652 kg)  01/16/14 176 lb (79.833 kg)       ASSESSMENT AND PLAN:  1. . Hypercholesterolemia 2. essential hypertension 3. soft right carotid bruit with previous carotid Dopplers showing no significant obstruction 4. Past history of syncope.  5. right thyroid nodule  Plan: Continue same medication.  We will add lisinopril 10 mg one daily for blood pressure control.  Return in one week for follow-up basal metabolic panel after starting lisinopril. Recheck in 6 months for office visit lipid panel hepatic function panel and basal metabolic panel,. Lab work today pending.   Current medicines are reviewed at length with the patient today.  The patient does not have concerns regarding medicines.  The following changes have been made: Start .Lisinopril 10 mg daily  Labs/ tests ordered today include:  No orders of the defined types were placed in this encounter.      Berna Spare MD 01/23/2015 8:17 AM    [address], [place], [place]  [address] Phone: [phone]; Fax: [phone]

## 2015-01-23 NOTE — Patient Instructions (Signed)
Medication Instructions:  START LISINOPRIL 10 MG ONE DAILY  Labwork: BMET IN 7-10 DAYS  Testing/Procedures: NONE  Follow-Up: Your physician wants you to follow-up in: 6 months with fasting labs (lp/bmet/hfp)  You will receive a reminder letter in the mail two months in advance. If you don't receive a letter, please call our office to schedule the follow-up appointment.

## 2015-01-23 NOTE — Addendum Note (Signed)
Addended by: Eulis Foster on: 01/23/2015 07:47 AM   Modules accepted: Orders

## 2015-01-24 NOTE — Progress Notes (Signed)
Quick Note:  Please report to patient. The recent labs are stable. Continue same medication and careful diet. ______ 

## 2015-01-26 ENCOUNTER — Telehealth: Payer: Self-pay | Admitting: Cardiology

## 2015-01-26 NOTE — Telephone Encounter (Signed)
New message      Calling to get lab results from friday

## 2015-01-26 NOTE — Telephone Encounter (Signed)
Advised patient of lab results  

## 2015-01-26 NOTE — Telephone Encounter (Signed)
-----   Message from Darlin Coco, MD sent at 01/24/2015 10:43 AM EDT ----- Please report to patient.  The recent labs are stable. Continue same medication and careful diet.

## 2015-01-30 ENCOUNTER — Other Ambulatory Visit (INDEPENDENT_AMBULATORY_CARE_PROVIDER_SITE_OTHER): Payer: Managed Care, Other (non HMO) | Admitting: *Deleted

## 2015-01-30 ENCOUNTER — Other Ambulatory Visit: Payer: Self-pay | Admitting: *Deleted

## 2015-01-30 DIAGNOSIS — I119 Hypertensive heart disease without heart failure: Secondary | ICD-10-CM

## 2015-01-30 LAB — BASIC METABOLIC PANEL
BUN: 15 mg/dL (ref 6–23)
CALCIUM: 9.2 mg/dL (ref 8.4–10.5)
CO2: 28 meq/L (ref 19–32)
Chloride: 107 mEq/L (ref 96–112)
Creatinine, Ser: 0.58 mg/dL (ref 0.40–1.20)
GFR: 108.25 mL/min (ref >60.00)
Glucose, Bld: 88 mg/dL (ref 70–99)
Potassium: 3.8 mEq/L (ref 3.5–5.1)
SODIUM: 140 meq/L (ref 135–145)

## 2015-01-30 NOTE — Progress Notes (Signed)
Quick Note:  Please report to patient. The recent labs are stable. Continue same medication and careful diet. ______ 

## 2015-01-30 NOTE — Addendum Note (Signed)
Addended by: Eulis Foster on: 01/30/2015 02:43 PM   Modules accepted: Orders

## 2015-06-15 ENCOUNTER — Telehealth: Payer: Self-pay

## 2015-06-15 NOTE — Telephone Encounter (Signed)
Pt wants to know when she had her last pneumonia shot and what kind it was.  [phone]

## 2015-06-16 NOTE — Telephone Encounter (Signed)
Last pneumonia shot was done 06/28/09. It was the pneumococcal vaccine. Left message to call back.

## 2015-06-17 NOTE — Telephone Encounter (Signed)
Patient left voicemail on Tuesday at 3:12pm requesing a call back. She says if we call back today make sure its after 3:30 so she will be home. Call back # [phone].

## 2015-06-17 NOTE — Telephone Encounter (Signed)
Spoke with patient. Gave her pneumonia vaccine info from paper chart. She understood and did not need anything else at this time.

## 2015-07-30 ENCOUNTER — Ambulatory Visit (INDEPENDENT_AMBULATORY_CARE_PROVIDER_SITE_OTHER): Payer: Managed Care, Other (non HMO) | Admitting: Cardiology

## 2015-07-30 ENCOUNTER — Encounter: Payer: Self-pay | Admitting: Cardiology

## 2015-07-30 ENCOUNTER — Other Ambulatory Visit (INDEPENDENT_AMBULATORY_CARE_PROVIDER_SITE_OTHER): Payer: Managed Care, Other (non HMO) | Admitting: *Deleted

## 2015-07-30 VITALS — BP 148/70 | HR 69 | Ht 63.5 in | Wt 177.8 lb

## 2015-07-30 DIAGNOSIS — E7800 Pure hypercholesterolemia, unspecified: Secondary | ICD-10-CM | POA: Diagnosis not present

## 2015-07-30 DIAGNOSIS — I119 Hypertensive heart disease without heart failure: Secondary | ICD-10-CM | POA: Diagnosis not present

## 2015-07-30 DIAGNOSIS — I10 Essential (primary) hypertension: Secondary | ICD-10-CM | POA: Diagnosis not present

## 2015-07-30 LAB — HEPATIC FUNCTION PANEL
ALT: 17 U/L (ref 6–29)
AST: 22 U/L (ref 10–35)
Albumin: 3.5 g/dL — ABNORMAL LOW (ref 3.6–5.1)
Alkaline Phosphatase: 81 U/L (ref 33–130)
BILIRUBIN INDIRECT: 0.4 mg/dL (ref 0.2–1.2)
Bilirubin, Direct: 0.1 mg/dL (ref <=0.2)
TOTAL PROTEIN: 6.8 g/dL (ref 6.1–8.1)
Total Bilirubin: 0.5 mg/dL (ref 0.2–1.2)

## 2015-07-30 LAB — LIPID PANEL
CHOL/HDL RATIO: 2.7 ratio (ref <=5.0)
CHOLESTEROL: 178 mg/dL (ref 125–200)
HDL: 65 mg/dL (ref >=46)
LDL Cholesterol: 101 mg/dL (ref <130)
TRIGLYCERIDES: 59 mg/dL (ref <150)
VLDL: 12 mg/dL (ref <30)

## 2015-07-30 MED ORDER — LISINOPRIL 20 MG PO TABS: 20.0000 mg | ORAL_TABLET | Freq: Every day | ORAL | Status: DC

## 2015-07-30 NOTE — Patient Instructions (Signed)
Medication Instructions:  INCREASE YOUR LISINOPRIL TO 20 MG DAILY   Labwork: LP/BMET/HFP  Testing/Procedures: NONE  Follow-Up: Your physician wants you to follow-up in: [place] will receive a reminder letter in the mail two months in advance. If you don't receive a letter, please call our office to schedule the follow-up appointment.  If you need a refill on your cardiac medications before your next appointment, please call your pharmacy.

## 2015-07-30 NOTE — Addendum Note (Signed)
Addended by: Eulis Foster on: 07/30/2015 08:35 AM   Modules accepted: Orders

## 2015-07-30 NOTE — Progress Notes (Signed)
Quick Note:  Please report to patient. The recent labs are stable. Continue same medication and careful diet. ______ 

## 2015-07-30 NOTE — Addendum Note (Signed)
Addended by: Eulis Foster on: 07/30/2015 07:55 AM   Modules accepted: Orders

## 2015-07-30 NOTE — Progress Notes (Signed)
Cardiology Office Note   Date:  07/30/2015   ID:  JOA MADDUX, DOB 1941/07/23, MRN [id number]  PCP:  Leandrew Koyanagi, MD  Cardiologist: Darlin Coco MD  No chief complaint on file.     History of Present Illness: [name] is a 74 y.o. female who presents for scheduled 6 month visit.  This pleasant 74 year old woman is seen for a scheduled followup office visit. She has a past history of essential hypertension and a remote history of syncope. She does not have any history of ischemic heart disease. She had a normal nuclear stress test in February 2010. He has a past history of hypercholesterolemia. She cannot take statins because of severe myalgias. She does take red yeast rice. She had a questionable TIA in April 2013 and had an ultrasound carotid duplex done at [facility] imaging no flow obstructing lesions of her carotids but did demonstrate an incidental finding of a 2.3 cm mixed echogenic solid nodule within the right lobe of the thyroid. She is not having any symptoms of hypothyroidism. She is not presently on any thyroid replacement hormone. Since last visit she's had no new cardiac symptoms.She checks her blood pressure at home. This morning at home it was [id number] systolic. Often it is in the high 130s. The patient avoids dietary salt.At her last visit we started lisinopril 10 mg daily.  She is tolerating it well.  The patient gets plenty of activity walking at her work.  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   Past Medical History  Diagnosis Date  . HTN (hypertension)   . History of syncope   . Asthma     AS A CHILD    No past surgical history on file.   Current Outpatient Prescriptions  Medication Sig Dispense Refill  . aspirin 81 MG tablet Take 1 tablet by mouth 4 times a week    . Coenzyme Q10 (CO Q 10 PO) Take 1 capsule by mouth daily.     Marland Kitchen Fexofenadine HCl (ALLEGRA PO) Take 1 capsule by mouth as needed (allergies). Every 24 hours    .  fish oil-omega-3 fatty acids 1000 MG capsule Take 1 g by mouth daily.     Marland Kitchen ibuprofen (ADVIL,MOTRIN) 200 MG tablet Take 200 mg by mouth 2 (two) times daily as needed (pain).    Marland Kitchen lisinopril (PRINIVIL,ZESTRIL) 10 MG tablet Take 1 tablet (10 mg total) by mouth daily. 90 tablet 3  . metoprolol succinate (TOPROL-XL) 25 MG 24 hr tablet Take 1 tablet (25 mg total) by mouth 2 (two) times daily. 180 tablet 3  . Multiple Vitamin (MULTIVITAMIN) tablet Take 1 tablet by mouth daily.       No current facility-administered medications for this visit.    Allergies:   Lovastatin and Sudafed    Social History:  The patient  reports that she has never smoked. She does not have any smokeless tobacco history on file. She reports that she does not drink alcohol or use illicit drugs.   Family History:  The patient's family history includes Dementia in her father; Diabetes in her mother; Heart disease in her mother; Kidney failure in her mother; Mitral valve prolapse in her daughter.    ROS:  Please see the history of present illness.   Otherwise, review of systems are positive for none.   All other systems are reviewed and negative.    PHYSICAL EXAM: VS:  BP 148/70 mmHg  Pulse 69  Ht  5' 3.5" (1.613 m)  Wt 177 lb 12.8 oz (80.65 kg)  BMI 31.00 kg/m2  SpO2 98% , BMI Body mass index is 31 kg/(m^2). GEN: Well nourished, well developed, in no acute distress HEENT: normal Neck: no JVD, very soft right carotid bruit Cardiac: RRR; no murmurs, rubs, or gallops,no edema  Respiratory:  clear to auscultation bilaterally, normal work of breathing GI: soft, nontender, nondistended, + BS MS: no deformity or atrophy Skin: warm and dry, no rash Neuro:  Strength and sensation are intact Psych: euthymic mood, full affect   EKG:  EKG is not ordered today. The ekg ordered today demonstrates    Recent Labs: 01/23/2015: ALT 16; TSH 2.31 01/30/2015: BUN 15; Creatinine, Ser 0.58; Potassium 3.8; Sodium 140    Lipid  Panel    Component Value Date/Time   CHOL 197 01/23/2015 0747   TRIG 60.0 01/23/2015 0747   HDL 65.90 01/23/2015 0747   CHOLHDL 3 01/23/2015 0747   VLDL 12.0 01/23/2015 0747   LDLCALC 119* 01/23/2015 0747   LDLDIRECT 140.3 06/01/2011 1019      Wt Readings from Last 3 Encounters:  07/30/15 177 lb 12.8 oz (80.65 kg)  01/23/15 176 lb (79.833 kg)  07/25/14 175 lb 9.6 oz (79.652 kg)        ASSESSMENT AND PLAN:  1. . Hypercholesterolemia 2. essential hypertension 3. soft right carotid bruit with previous carotid Dopplers showing no significant obstruction 4. Past history of syncope.  5. right thyroid nodule  Plan: Blood pressure is still running high.  I rechecked it and got 150/70. We will increase lisinopril up to 20 mg daily for better blood pressure control.  Recheck in 6 months for office visit lipid panel hepatic function panel and basal metabolic panel,. Lab work today pending.   Current medicines are reviewed at length with the patient today.  The patient does not have concerns regarding medicines.  The following changes have been made:  no change  Labs/ tests ordered today include:  No orders of the defined types were placed in this encounter.     Disposition:  Increase lisinopril up to 20 mg daily.  Recheck in 6 months with Dr. Radford Pax  Signed, Darlin Coco MD 07/30/2015 8:19 [address], [place], [place]  [address] Phone: [phone]; Fax: [phone]

## 2015-07-30 NOTE — Addendum Note (Signed)
Addended by: Eulis Foster on: 07/30/2015 07:56 AM   Modules accepted: Orders

## 2015-08-01 LAB — BASIC METABOLIC PANEL
BUN: 16 mg/dL (ref 7–25)
CALCIUM: 9.2 mg/dL (ref 8.6–10.4)
CO2: 22 mmol/L (ref 20–31)
Chloride: 106 mmol/L (ref 98–110)
Creat: 0.63 mg/dL (ref 0.60–0.93)
GLUCOSE: 71 mg/dL (ref 65–99)
Potassium: 4.4 mmol/L (ref 3.5–5.3)
SODIUM: 139 mmol/L (ref 135–146)

## 2015-08-02 NOTE — Progress Notes (Signed)
Quick Note:  Please report to patient. The recent labs are stable. Continue same medication and careful diet. ______ 

## 2015-08-04 ENCOUNTER — Telehealth: Payer: Self-pay | Admitting: Cardiology

## 2015-08-04 NOTE — Telephone Encounter (Signed)
Follow up ° °Pt returned call  °

## 2015-08-04 NOTE — Telephone Encounter (Signed)
-----   Message from Darlin Coco, MD sent at 08/02/2015  6:56 PM EST ----- Please report to patient.  The recent labs are stable. Continue same medication and careful diet.

## 2015-08-04 NOTE — Telephone Encounter (Signed)
Left message to call back  

## 2015-08-04 NOTE — Telephone Encounter (Signed)
Advised patient of lab results  

## 2015-08-04 NOTE — Telephone Encounter (Signed)
Follow up  ° ° °Patient returning call back to nurse  °

## 2015-08-10 ENCOUNTER — Encounter: Payer: Self-pay | Admitting: Internal Medicine

## 2015-08-24 ENCOUNTER — Ambulatory Visit (INDEPENDENT_AMBULATORY_CARE_PROVIDER_SITE_OTHER): Payer: Managed Care, Other (non HMO) | Admitting: Family Medicine

## 2015-08-24 VITALS — BP 136/80 | HR 76 | Temp 98.2°F | Resp 16 | Ht 63.0 in | Wt 180.4 lb

## 2015-08-24 DIAGNOSIS — M25561 Pain in right knee: Secondary | ICD-10-CM

## 2015-08-24 MED ORDER — ACE KNEE BRACE MEDIUM MISC: Status: DC

## 2015-08-24 NOTE — Patient Instructions (Addendum)
It looks like you have strained your knee.  Wear the knee brace when you're working or on your feet.  Take the Ibuprofen for pain relief as you have been.  Call your Orthopedist to schedule an appointment.  We'll put in a referral as well.  It was good to meet you today.

## 2015-08-24 NOTE — Progress Notes (Signed)
[name] is a 74 y.o. female who presents to Urgent Care today for Right knee pain  1.  RIght knee pain:  Present for about 1 week.  Last weeks, she spent the day raking her yard and doing yardwork.  Works on her feet all day.  Day after yardwork, she began having swelling and pain in her Right knee.  Describes as dull ache.  Worse when moving from sitting to standing.  Doesn't use stairs so doesn't know how stairs affect her knees.    Better with heat. Has taken 200 mg Ibuprofen with relief.  Yesterday pain bad enough she took 400 mg, which resolved.  Only used 200 mg today due to mild pain. No crepitus.  No locking.  No popping/snapping of knee.   ROS as above otherwise neg.  PMH reviewed.  Past Medical History  Diagnosis Date  . HTN (hypertension)   . History of syncope   . Asthma     AS A CHILD  . Allergy    Past Surgical History  Procedure Laterality Date  . Appendectomy    . Cholecystectomy    . Abdominal hysterectomy      Medications reviewed. Current Outpatient Prescriptions  Medication Sig Dispense Refill  . aspirin 81 MG tablet Take 1 tablet by mouth 4 times a week    . Coenzyme Q10 (CO Q 10 PO) Take 1 capsule by mouth daily.     Marland Kitchen Fexofenadine HCl (ALLEGRA PO) Take 1 capsule by mouth as needed (allergies). Every 24 hours    . fish oil-omega-3 fatty acids 1000 MG capsule Take 1 g by mouth daily.     Marland Kitchen ibuprofen (ADVIL,MOTRIN) 200 MG tablet Take 200 mg by mouth 2 (two) times daily as needed (pain).    Marland Kitchen lisinopril (PRINIVIL,ZESTRIL) 20 MG tablet Take 1 tablet (20 mg total) by mouth daily. 90 tablet 3  . metoprolol succinate (TOPROL-XL) 25 MG 24 hr tablet Take 1 tablet (25 mg total) by mouth 2 (two) times daily. 180 tablet 3  . Multiple Vitamin (MULTIVITAMIN) tablet Take 1 tablet by mouth daily.       No current facility-administered medications for this visit.     Physical Exam:  BP 178/76 mmHg  Pulse 76  Temp(Src) 98.2 F (36.8 C) (Oral)  Resp 16  Ht 5'  3" (1.6 m)  Wt 180 lb 6.4 oz (81.829 kg)  BMI 31.96 kg/m2  SpO2 98% Gen:  Alert, cooperative patient who appears stated age in no acute distress.  Vital signs reviewed. HEENT: EOMI,  MMM Pulm:  Clear to auscultation bilaterally with good air movement.  No wheezes or rales noted.   Cardiac:  Regular rate and rhythm without murmur auscultated.  Good S1/S2. Exts: +1 edema BL ankles.   MSK:  Left knee WNL. Right knee:  TTP along lateral joint line.  Minimal effusion over patella.  Otherwise no effusion noted. No warmth or redness.  Strength 5/5 knee extension and flexion.  Lachmann's negative.  Ant/post drawer negative.  McMurray's negative. Lateral/medial ligament testing negative for pain/laxity.    Assessment and Plan:  1.  Right knee strain: - most likely overuse injury.  Minimal pain.  No need for radiographs.   - No red flags noted by exam.  - does have minimal patellar effusion, but otherwise none.  - ACE bandage provided mostly for comfort.   - Heat to knee as needed.   - Ligamentous testing is negative.  - FU with orthopedist.  She is already established with [facility].  Not sure if needs a referral, will place this to be sure.   2.  HTN: - much better on repeat of 134/80.   - FU with regular PCP/cardiologist as previously scheduled.

## 2016-02-04 ENCOUNTER — Ambulatory Visit: Payer: Managed Care, Other (non HMO) | Admitting: Cardiology

## 2016-02-05 ENCOUNTER — Encounter: Payer: Self-pay | Admitting: Cardiology

## 2016-02-05 ENCOUNTER — Ambulatory Visit (INDEPENDENT_AMBULATORY_CARE_PROVIDER_SITE_OTHER): Payer: Managed Care, Other (non HMO) | Admitting: Cardiology

## 2016-02-05 VITALS — BP 182/74 | HR 60 | Ht 63.5 in | Wt 175.8 lb

## 2016-02-05 DIAGNOSIS — E041 Nontoxic single thyroid nodule: Secondary | ICD-10-CM

## 2016-02-05 DIAGNOSIS — I10 Essential (primary) hypertension: Secondary | ICD-10-CM

## 2016-02-05 DIAGNOSIS — Z9189 Other specified personal risk factors, not elsewhere classified: Secondary | ICD-10-CM

## 2016-02-05 DIAGNOSIS — Z87898 Personal history of other specified conditions: Secondary | ICD-10-CM

## 2016-02-05 DIAGNOSIS — E7800 Pure hypercholesterolemia, unspecified: Secondary | ICD-10-CM | POA: Diagnosis not present

## 2016-02-05 DIAGNOSIS — Z7189 Other specified counseling: Secondary | ICD-10-CM

## 2016-02-05 DIAGNOSIS — Z7689 Persons encountering health services in other specified circumstances: Secondary | ICD-10-CM

## 2016-02-05 NOTE — Patient Instructions (Signed)
Medication Instructions:  Your physician recommends that you continue on your current medications as directed. Please refer to the Current Medication list given to you today.   Labwork: Your physician recommends that you return for FASTING lab work.   Testing/Procedures: None  Follow-Up: You have been referred to Dr. Laurann Montana or associate for care establishment and thyroid nodule.  Your physician wants you to follow-up in: 6 months with Dr. Radford Pax. You will receive a reminder letter in the mail two months in advance. If you don't receive a letter, please call our office to schedule the follow-up appointment.  Any Other Special Instructions Will Be Listed Below (If Applicable).     If you need a refill on your cardiac medications before your next appointment, please call your pharmacy.

## 2016-02-05 NOTE — Progress Notes (Signed)
Cardiology Office Note    Date:  02/05/2016   ID:  Kerry [name], DOB 1941/03/07, MRN [id number]  PCP:  Kerry Koyanagi, MD  Cardiologist:  Kerry Him, MD   Chief Complaint  Patient presents with  . Hypertension  . Hyperlipidemia    History of Present Illness:  Kerry [name] is a 75 y.o. female with a past history of essential hypertension and a remote history of syncope. She does not have any history of ischemic heart disease. She had a normal nuclear stress test in February 2010.  She has a past history of hypercholesterolemia. She cannot take statins because of severe myalgias. She does take red yeast rice. She had a questionable TIA in April 2013 and had an ultrasound carotid duplex done at [facility] no flow obstructing lesions of her carotids but did demonstrate an incidental finding of a 2.3 cm mixed echogenic solid nodule within the right lobe of the thyroid. Since last visit she's had no chest pain, dizziness, palpitations or syncope.  She does have some asthma and has noticed lately that when she walks outside, she may feel somewhat SOB but thinks it is related to allergies.  Occasionally she will get some LE edema but has had injuries to both of her LE.  The patient avoids dietary salt.The patient gets plenty of activity walking at her work.   Past Medical History  Diagnosis Date  . HTN (hypertension)   . History of syncope   . Asthma     AS A CHILD  . Allergy     Past Surgical History  Procedure Laterality Date  . Appendectomy    . Cholecystectomy    . Abdominal hysterectomy      Current Medications: Outpatient Prescriptions Prior to Visit  Medication Sig Dispense Refill  . aspirin 81 MG tablet Take 1 tablet by mouth 4 times a week    . Coenzyme Q10 (CO Q 10 PO) Take 1 capsule by mouth daily.     Marland Kitchen Fexofenadine HCl (ALLEGRA PO) Take 1 capsule by mouth as needed (allergies). Every 24 hours    . fish oil-omega-3 fatty acids 1000 MG capsule  Take 1 g by mouth daily.     Marland Kitchen ibuprofen (ADVIL,MOTRIN) 200 MG tablet Take 200 mg by mouth 2 (two) times daily as needed (pain).    Marland Kitchen lisinopril (PRINIVIL,ZESTRIL) 20 MG tablet Take 1 tablet (20 mg total) by mouth daily. 90 tablet 3  . metoprolol succinate (TOPROL-XL) 25 MG 24 hr tablet Take 1 tablet (25 mg total) by mouth 2 (two) times daily. 180 tablet 3  . Multiple Vitamin (MULTIVITAMIN) tablet Take 1 tablet by mouth daily.      . Elastic Bandages & Supports (ACE KNEE BRACE MEDIUM) MISC Sleeved knee brace.  Use daily as needed on Right knee (Patient not taking: Reported on 02/05/2016) 1 each 0   No facility-administered medications prior to visit.     Allergies:   Lovastatin and Sudafed   Social History   Social History  . Marital Status: Widowed    Spouse Name: N/A  . Number of Children: N/A  . Years of Education: N/A   Social History Main Topics  . Smoking status: Never Smoker   . Smokeless tobacco: None  . Alcohol Use: No  . Drug Use: No  . Sexual Activity: Not Asked   Other Topics Concern  . None   Social History Narrative     Family History:  The patient's family  history includes Dementia in her father; Diabetes in her mother; Heart disease in her mother; Kidney failure in her mother; Mitral valve prolapse in her daughter.   ROS:   Please see the history of present illness.    ROS All other systems reviewed and are negative.   PHYSICAL EXAM:   VS:  BP 182/74 mmHg  Pulse 60  Ht 5' 3.5" (1.613 m)  Wt 175 lb 12.8 oz (79.742 kg)  BMI 30.65 kg/m2   GEN: Well nourished, well developed, in no acute distress HEENT: normal Neck: no JVD, carotid bruits, or masses Cardiac: RRR; no murmurs, rubs, or gallops,no edema.  Intact distal pulses bilaterally.  Respiratory:  clear to auscultation bilaterally, normal work of breathing GI: soft, nontender, nondistended, + BS MS: no deformity or atrophy Skin: warm and dry, no rash Neuro:  Alert and Oriented x 3, Strength and  sensation are intact Psych: euthymic mood, full affect  Wt Readings from Last 3 Encounters:  02/05/16 175 lb 12.8 oz (79.742 kg)  08/24/15 180 lb 6.4 oz (81.829 kg)  07/30/15 177 lb 12.8 oz (80.65 kg)      Studies/Labs Reviewed:   EKG:  EKG is  ordered today.  The ekg ordered today demonstrates NSR at 60bpm with LVH by voltage  Recent Labs: 07/30/2015: ALT 17 07/31/2015: BUN 16; Creat 0.63; Potassium 4.4; Sodium 139   Lipid Panel    Component Value Date/Time   CHOL 178 07/30/2015 0835   TRIG 59 07/30/2015 0835   HDL 65 07/30/2015 0835   CHOLHDL 2.7 07/30/2015 0835   VLDL 12 07/30/2015 0835   LDLCALC 101 07/30/2015 0835   LDLDIRECT 140.3 06/01/2011 1019    Additional studies/ records that were reviewed today include:  none    ASSESSMENT:    1. Essential hypertension   2. Hypercholesterolemia   3. History of syncope      PLAN:  In order of problems listed above:  1. HTN - BP poorly controlled on current medical regimen but has white coat HTN.  Her BP readings from home range from 128-137/58-23mmHg.  Continue ACE I and BB.  2. Hyperlipidemia - She is statin intolerant.  Her last LDL was 101.  Recheck FLP and ALT.   3. Syncope - no reoccurence.    Medication Adjustments/Labs and Tests Ordered: Current medicines are reviewed at length with the patient today.  Concerns regarding medicines are outlined above.  Medication changes, Labs and Tests ordered today are listed in the Patient Instructions below.  There are no Patient Instructions on file for this visit.   Signed, Kerry Him, MD  02/05/2016 8:54 AM    [address], [place], [place]  [address] Phone: [phone]; Fax: [phone]

## 2016-02-25 ENCOUNTER — Other Ambulatory Visit (INDEPENDENT_AMBULATORY_CARE_PROVIDER_SITE_OTHER): Payer: Managed Care, Other (non HMO) | Admitting: *Deleted

## 2016-02-25 DIAGNOSIS — I10 Essential (primary) hypertension: Secondary | ICD-10-CM

## 2016-02-25 DIAGNOSIS — E7800 Pure hypercholesterolemia, unspecified: Secondary | ICD-10-CM

## 2016-02-26 ENCOUNTER — Telehealth: Payer: Self-pay | Admitting: Cardiology

## 2016-02-26 LAB — BASIC METABOLIC PANEL
BUN: 16 mg/dL (ref 7–25)
CALCIUM: 9.7 mg/dL (ref 8.6–10.4)
CO2: 27 mmol/L (ref 20–31)
Chloride: 106 mmol/L (ref 98–110)
Creat: 0.6 mg/dL (ref 0.60–0.93)
GLUCOSE: 89 mg/dL (ref 65–99)
POTASSIUM: 4.5 mmol/L (ref 3.5–5.3)
SODIUM: 140 mmol/L (ref 135–146)

## 2016-02-26 LAB — HEPATIC FUNCTION PANEL
ALBUMIN: 4 g/dL (ref 3.6–5.1)
ALK PHOS: 98 U/L (ref 33–130)
ALT: 13 U/L (ref 6–29)
AST: 17 U/L (ref 10–35)
Bilirubin, Direct: 0.1 mg/dL (ref <=0.2)
Indirect Bilirubin: 0.5 mg/dL (ref 0.2–1.2)
TOTAL PROTEIN: 6.8 g/dL (ref 6.1–8.1)
Total Bilirubin: 0.6 mg/dL (ref 0.2–1.2)

## 2016-02-26 LAB — LIPID PANEL
CHOLESTEROL: 196 mg/dL (ref 125–200)
HDL: 58 mg/dL (ref >=46)
LDL Cholesterol: 117 mg/dL (ref <130)
Total CHOL/HDL Ratio: 3.4 Ratio (ref <=5.0)
Triglycerides: 105 mg/dL (ref <150)
VLDL: 21 mg/dL (ref <30)

## 2016-02-26 NOTE — Telephone Encounter (Signed)
New Message:  Pt is calling Danielle back about the results to her labs. Please f/u with her

## 2016-02-26 NOTE — Telephone Encounter (Signed)
Returned call to patient lab results given. 

## 2016-03-24 ENCOUNTER — Other Ambulatory Visit: Payer: Self-pay

## 2016-03-24 MED ORDER — METOPROLOL SUCCINATE ER 25 MG PO TB24: 25.0000 mg | ORAL_TABLET | Freq: Two times a day (BID) | ORAL | Status: DC

## 2016-07-25 ENCOUNTER — Other Ambulatory Visit: Payer: Self-pay

## 2016-07-25 MED ORDER — LISINOPRIL 20 MG PO TABS: 20.0000 mg | ORAL_TABLET | Freq: Every day | ORAL | 1 refills | Status: DC

## 2016-08-18 ENCOUNTER — Encounter (INDEPENDENT_AMBULATORY_CARE_PROVIDER_SITE_OTHER): Payer: Self-pay

## 2016-08-18 ENCOUNTER — Encounter: Payer: Self-pay | Admitting: Cardiology

## 2016-08-18 ENCOUNTER — Ambulatory Visit (INDEPENDENT_AMBULATORY_CARE_PROVIDER_SITE_OTHER): Payer: Managed Care, Other (non HMO) | Admitting: Cardiology

## 2016-08-18 VITALS — BP 146/84 | HR 71 | Ht 63.5 in | Wt 176.1 lb

## 2016-08-18 DIAGNOSIS — E785 Hyperlipidemia, unspecified: Secondary | ICD-10-CM | POA: Insufficient documentation

## 2016-08-18 DIAGNOSIS — Z87898 Personal history of other specified conditions: Secondary | ICD-10-CM

## 2016-08-18 DIAGNOSIS — I10 Essential (primary) hypertension: Secondary | ICD-10-CM

## 2016-08-18 DIAGNOSIS — E7800 Pure hypercholesterolemia, unspecified: Secondary | ICD-10-CM | POA: Diagnosis not present

## 2016-08-18 LAB — LIPID PANEL
CHOL/HDL RATIO: 2.9 ratio (ref <5.0)
Cholesterol: 195 mg/dL (ref <200)
HDL: 68 mg/dL (ref >50)
LDL CALC: 113 mg/dL — AB (ref <100)
TRIGLYCERIDES: 68 mg/dL (ref <150)
VLDL: 14 mg/dL (ref <30)

## 2016-08-18 LAB — HEPATIC FUNCTION PANEL
ALT: 17 U/L (ref 6–29)
AST: 23 U/L (ref 10–35)
Albumin: 4 g/dL (ref 3.6–5.1)
Alkaline Phosphatase: 93 U/L (ref 33–130)
BILIRUBIN INDIRECT: 0.6 mg/dL (ref 0.2–1.2)
BILIRUBIN TOTAL: 0.7 mg/dL (ref 0.2–1.2)
Bilirubin, Direct: 0.1 mg/dL (ref <=0.2)
TOTAL PROTEIN: 7.1 g/dL (ref 6.1–8.1)

## 2016-08-18 LAB — BASIC METABOLIC PANEL
BUN: 17 mg/dL (ref 7–25)
CALCIUM: 9.5 mg/dL (ref 8.6–10.4)
CO2: 28 mmol/L (ref 20–31)
CREATININE: 0.61 mg/dL (ref 0.60–0.93)
Chloride: 105 mmol/L (ref 98–110)
Glucose, Bld: 82 mg/dL (ref 65–99)
Potassium: 4.3 mmol/L (ref 3.5–5.3)
SODIUM: 140 mmol/L (ref 135–146)

## 2016-08-18 NOTE — Progress Notes (Signed)
Cardiology Office Note    Date:  08/18/2016   ID:  Kerry [name], DOB 05/30/1941, MRN [id number]  PCP:  Leandrew Koyanagi, MD (Inactive)  Cardiologist:  Fransico Him, MD   Chief Complaint  Patient presents with  . Hypertension    History of Present Illness:  Kerry [name] is a 75 y.o. female with a past history of HTN, syncope and hyperlipidemia (statin intolerant).   Since last visit she's had no chest pain, dizziness, palpitations or syncope.  She is doing well today.  She denies any chest pain, dizziness, palpitations, dizziness or syncope.  She has chronic DOE due to asthma which is stable.  Occasionally she will get some LE edema but has had injuries to both of her LE.  The patient avoids dietary salt.The patient gets plenty of activity walking at her work.    Past Medical History:  Diagnosis Date  . Allergy   . Asthma    AS A CHILD  . History of syncope   . HTN (hypertension)   . Hyperlipidemia     Past Surgical History:  Procedure Laterality Date  . ABDOMINAL HYSTERECTOMY    . APPENDECTOMY    . CHOLECYSTECTOMY      Current Medications: Outpatient Medications Prior to Visit  Medication Sig Dispense Refill  . aspirin 81 MG tablet Take 1 tablet by mouth 4 times a week    . Coenzyme Q10 (CO Q 10 PO) Take 1 capsule by mouth daily.     Marland Kitchen Fexofenadine HCl (ALLEGRA PO) Take 1 capsule by mouth as needed (allergies). Every 24 hours    . fish oil-omega-3 fatty acids 1000 MG capsule Take 1 g by mouth daily.     Marland Kitchen ibuprofen (ADVIL,MOTRIN) 200 MG tablet Take 200 mg by mouth 2 (two) times daily as needed (pain).    Marland Kitchen lisinopril (PRINIVIL,ZESTRIL) 20 MG tablet Take 1 tablet (20 mg total) by mouth daily. 90 tablet 1  . metoprolol succinate (TOPROL-XL) 25 MG 24 hr tablet Take 1 tablet (25 mg total) by mouth 2 (two) times daily. 180 tablet 1  . Multiple Vitamin (MULTIVITAMIN) tablet Take 1 tablet by mouth daily.       No facility-administered medications prior to visit.       Allergies:   Lovastatin and Sudafed [pseudoephedrine hcl]   Social History   Social History  . Marital status: Widowed    Spouse name: N/A  . Number of children: N/A  . Years of education: N/A   Social History Main Topics  . Smoking status: Never Smoker  . Smokeless tobacco: Never Used  . Alcohol use No  . Drug use: No  . Sexual activity: Not Asked   Other Topics Concern  . None   Social History Narrative  . None     Family History:  The patient's family history includes Dementia in her father; Diabetes in her mother; Heart disease in her mother; Kidney failure in her mother; Mitral valve prolapse in her daughter.   ROS:   Please see the history of present illness.    ROS All other systems reviewed and are negative.  No flowsheet data found.     PHYSICAL EXAM:   VS:  BP (!) 146/84   Pulse 71   Ht 5' 3.5" (1.613 m)   Wt 176 lb 1.9 oz (79.9 kg)   SpO2 98%   BMI 30.71 kg/m    GEN: Well nourished, well developed, in no acute distress  HEENT:  normal  Neck: no JVD, carotid bruits, or masses Cardiac: RRR; no murmurs, rubs, or gallops,no edema.  Intact distal pulses bilaterally.  Respiratory:  clear to auscultation bilaterally, normal work of breathing GI: soft, nontender, nondistended, + BS MS: no deformity or atrophy  Skin: warm and dry, no rash Neuro:  Alert and Oriented x 3, Strength and sensation are intact Psych: euthymic mood, full affect  Wt Readings from Last 3 Encounters:  08/18/16 176 lb 1.9 oz (79.9 kg)  02/05/16 175 lb 12.8 oz (79.7 kg)  08/24/15 180 lb 6.4 oz (81.8 kg)      Studies/Labs Reviewed:   EKG:  EKG is not  ordered today.    Recent Labs: 02/25/2016: ALT 13; BUN 16; Creat 0.60; Potassium 4.5; Sodium 140   Lipid Panel    Component Value Date/Time   CHOL 196 02/25/2016 0731   TRIG 105 02/25/2016 0731   HDL 58 02/25/2016 0731   CHOLHDL 3.4 02/25/2016 0731   VLDL 21 02/25/2016 0731   LDLCALC 117 02/25/2016 0731   LDLDIRECT  140.3 06/01/2011 1019    Additional studies/ records that were reviewed today include:  none    ASSESSMENT:    1. Essential hypertension   2. History of syncope   3. Hypercholesterolemia   4. Pure hypercholesterolemia      PLAN:  In order of problems listed above:  1. HTN - BP controlled on current meds.  Continue ACE I and BB.   2. History of syncope - no further episodes 3. Hyperlipidemia - she is statin in tolerant and is on red yeast rice.  I will check an FLP and ALT today as she is fasting.      Medication Adjustments/Labs and Tests Ordered: Current medicines are reviewed at length with the patient today.  Concerns regarding medicines are outlined above.  Medication changes, Labs and Tests ordered today are listed in the Patient Instructions below.  There are no Patient Instructions on file for this visit.   Signed, Fransico Him, MD  08/18/2016 8:39 AM    [address], [place], [place]  [address] Phone: [phone]; Fax: [phone]

## 2016-08-18 NOTE — Patient Instructions (Signed)

## 2016-09-20 ENCOUNTER — Other Ambulatory Visit: Payer: Self-pay | Admitting: Cardiology

## 2016-11-09 ENCOUNTER — Ambulatory Visit (INDEPENDENT_AMBULATORY_CARE_PROVIDER_SITE_OTHER): Payer: 59 | Admitting: Physician Assistant

## 2016-11-09 VITALS — BP 170/79 | HR 81 | Temp 99.0°F | Resp 16 | Ht 63.5 in | Wt 187.0 lb

## 2016-11-09 DIAGNOSIS — J101 Influenza due to other identified influenza virus with other respiratory manifestations: Secondary | ICD-10-CM

## 2016-11-09 DIAGNOSIS — J029 Acute pharyngitis, unspecified: Secondary | ICD-10-CM | POA: Diagnosis not present

## 2016-11-09 DIAGNOSIS — I10 Essential (primary) hypertension: Secondary | ICD-10-CM

## 2016-11-09 DIAGNOSIS — R05 Cough: Secondary | ICD-10-CM

## 2016-11-09 DIAGNOSIS — R059 Cough, unspecified: Secondary | ICD-10-CM

## 2016-11-09 LAB — POCT RAPID STREP A (OFFICE)
RAPID STREP A SCREEN: NEGATIVE
Rapid Strep A Screen: NEGATIVE

## 2016-11-09 LAB — GLUCOSE, POCT (MANUAL RESULT ENTRY): POC GLUCOSE: 97 mg/dL (ref 70–99)

## 2016-11-09 LAB — POCT INFLUENZA A/B
Influenza A, POC: POSITIVE — AB
Influenza B, POC: NEGATIVE

## 2016-11-09 MED ORDER — OSELTAMIVIR PHOSPHATE 75 MG PO CAPS
75.0000 mg | ORAL_CAPSULE | Freq: Two times a day (BID) | ORAL | 0 refills | Status: DC
Start: 2016-11-09 — End: 2017-04-14

## 2016-11-09 MED ORDER — BENZONATATE 100 MG PO CAPS: 100.0000 mg | ORAL_CAPSULE | Freq: Three times a day (TID) | ORAL | 0 refills | Status: DC | PRN

## 2016-11-09 NOTE — Progress Notes (Signed)
MRN: [id number] DOB: 03-27-1941  Subjective:   Kerry [name] is a 76 y.o. female presenting for chief complaint of Sore Throat; Cough; and Headache .  Reports 2 day history of sinus headache, rhinorrhea, ear fullness, sore throat, dry cough (no hemoptysis), and mild body aches. Has tried asprin and vitamin c with no relief. Denies fever, wheezing and chest tightness, nausea, vomiting, abdominal pain and diarrhea. Has had sick contact with people she was just at [facility]. Has history of seasonal allergies and history of asthma. Patient has had flu shot this season. Denies smoking and alcohol use. Denies any other aggravating or relieving factors, no other questions or concerns.  [place] has a current medication list which includes the following prescription(s): aspirin, vitamin d3, coenzyme q10, fexofenadine hcl, fish oil-omega-3 fatty acids, ibuprofen, lisinopril, metoprolol succinate, and multivitamin. Also is allergic to lovastatin and sudafed [pseudoephedrine hcl].  [place]  has a past medical history of Allergy; Asthma; History of syncope; HTN (hypertension); and Hyperlipidemia. Also  has a past surgical history that includes Appendectomy; Cholecystectomy; and Abdominal hysterectomy.   Objective:   Vitals: BP (!) 170/79   Pulse 81   Temp 99 F (37.2 C) (Oral)   Resp 16   Ht 5' 3.5" (1.613 m)   Wt 187 lb (84.8 kg)   SpO2 96%   BMI 32.61 kg/m   Physical Exam  Constitutional: She is oriented to person, place, and time. She appears well-developed and well-nourished. She appears distressed (mild, appears tired sitting in exam chair).  HENT:  Head: Normocephalic and atraumatic.  Right Ear: Tympanic membrane, external ear and ear canal normal.  Left Ear: Tympanic membrane, external ear and ear canal normal.  Nose: Mucosal edema present. Right sinus exhibits no maxillary sinus tenderness and no frontal sinus tenderness. Left sinus exhibits no maxillary sinus tenderness and no  frontal sinus tenderness.  Eyes: Conjunctivae are normal.  Neck: Normal range of motion.  Cardiovascular: Normal rate, regular rhythm, normal heart sounds and intact distal pulses.   Pulmonary/Chest: Effort normal and breath sounds normal.  Lymphadenopathy:       Head (right side): No submental, no submandibular, no tonsillar, no preauricular, no posterior auricular and no occipital adenopathy present.       Head (left side): No submental, no submandibular, no tonsillar, no preauricular, no posterior auricular and no occipital adenopathy present.    She has no cervical adenopathy.       Right: No supraclavicular adenopathy present.       Left: No supraclavicular adenopathy present.  Neurological: She is alert and oriented to person, place, and time.  Skin: Skin is dry.  Skin is very warm to palpation.   Psychiatric: She has a normal mood and affect.  Vitals reviewed.   Results for orders placed or performed in visit on 11/09/16 (from the past 24 hour(s))  POCT rapid strep A     Status: None   Collection Time: 11/09/16 11:59 AM  Result Value Ref Range   Rapid Strep A Screen Negative Negative  POCT Influenza A/B     Status: Abnormal   Collection Time: 11/09/16 12:00 PM  Result Value Ref Range   Influenza A, POC Positive (A) Negative   Influenza B, POC Negative Negative  POCT glucose (manual entry)     Status: None   Collection Time: 11/09/16 12:03 PM  Result Value Ref Range   POC Glucose 97 70 - 99 mg/dl  POCT rapid strep A     Status:  None   Collection Time: 11/09/16 12:04 PM  Result Value Ref Range   Rapid Strep A Screen Negative Negative    Assessment and Plan :  1. Sore throat - POCT glucose (manual entry) - POCT Influenza A/B - POCT rapid strep A - Culture, Group A Strep  2. Essential hypertension -Likely due to illness. Pt states she took her bp right before coming to the office. Informed her to monitor it closely at home, if she has worsening elevation, chest pain,  visual disturbance, or SOB seek care immediately at ED.  3. Influenza A Given educational material about the flu. Instructed to return to clinic if symptoms worsen, do not improve in 7-10 days, or as needed - oseltamivir (TAMIFLU) 75 MG capsule; Take 1 capsule (75 mg total) by mouth 2 (two) times daily.  Dispense: 10 capsule; Refill: 0  4. Cough - benzonatate (TESSALON) 100 MG capsule; Take 1-2 capsules (100-200 mg total) by mouth 3 (three) times daily as needed for cough.  Dispense: 40 capsule; Refill: 0  Tenna Delaine, PA-C  [facility] 11/09/2016 12:11 PM

## 2016-11-09 NOTE — Patient Instructions (Addendum)
You have tested positive for the flu, you are contagious until you are fever free for 24 hours without using tylenol or ibuprofen.Please stay out of work until you are no longer contagious. Two major complications after the flu are pneumonia and sinus infections. Please be aware of this and if you are not any better in 7-10 days or you develop worsening cough or sinus pressure, seek care at our clinic or the ED. Continue to wash your hands and wear a mask daily especially around other people.   Monitor your bp at home, if you consistently have elevated bp or start to have chest pain, visual disturbance, or shortness of breath seek care immediately.     Influenza, Adult Influenza ("the flu") is an infection in the lungs, nose, and throat (respiratory tract). It is caused by a virus. The flu causes many common cold symptoms, as well as a high fever and body aches. It can make you feel very sick. The flu spreads easily from person to person (is contagious). Getting a flu shot (influenza vaccination) every year is the best way to prevent the flu. Follow these instructions at home:  Take over-the-counter and prescription medicines only as told by your doctor.  Use a cool mist humidifier to add moisture (humidity) to the air in your home. This can make it easier to breathe.  Rest as needed.  Drink enough fluid to keep your pee (urine) clear or pale yellow.  Cover your mouth and nose when you cough or sneeze.  Wash your hands with soap and water often, especially after you cough or sneeze. If you cannot use soap and water, use hand sanitizer.  Stay home from work or school as told by your doctor. Unless you are visiting your doctor, try to avoid leaving home until your fever has been gone for 24 hours without the use of medicine.  Keep all follow-up visits as told by your doctor. This is important. How is this prevented?  Getting a yearly (annual) flu shot is the best way to avoid getting the  flu. You may get the flu shot in late summer, fall, or winter. Ask your doctor when you should get your flu shot.  Wash your hands often or use hand sanitizer often.  Avoid contact with people who are sick during cold and flu season.  Eat healthy foods.  Drink plenty of fluids.  Get enough sleep.  Exercise regularly. Contact a doctor if:  You get new symptoms.  You have:  Chest pain.  Watery poop (diarrhea).  A fever.  Your cough gets worse.  You start to have more mucus.  You feel sick to your stomach (nauseous).  You throw up (vomit). Get help right away if:  You start to be short of breath or have trouble breathing.  Your skin or nails turn a bluish color.  You have very bad pain or stiffness in your neck.  You get a sudden headache.  You get sudden pain in your face or ear.  You cannot stop throwing up. This information is not intended to replace advice given to you by your health care provider. Make sure you discuss any questions you have with your health care provider. Document Released: 06/07/2008 Document Revised: 02/04/2016 Document Reviewed: 06/23/2015 Elsevier Interactive Patient Education  2017 [employer].    IF you received an x-ray today, you will receive an invoice from [facility]. Please contact [facility] at [phone] with questions or concerns regarding your invoice.  IF you received labwork today, you will receive an invoice from [facility]. Please contact [facility] at [phone] with questions or concerns regarding your invoice.   Our billing staff will not be able to assist you with questions regarding bills from these companies.  You will be contacted with the lab results as soon as they are available. The fastest way to get your results is to activate your My Chart account. Instructions are located on the last page of this paperwork. If you have not heard from [place] regarding the results in 2 weeks, please contact  this office.

## 2016-11-11 ENCOUNTER — Encounter (HOSPITAL_COMMUNITY): Payer: Self-pay | Admitting: Emergency Medicine

## 2016-11-11 ENCOUNTER — Emergency Department (HOSPITAL_COMMUNITY)
Admission: EM | Admit: 2016-11-11 | Discharge: 2016-11-11 | Disposition: A | Discharge status: Home / Self Care | Payer: 59 | Attending: Emergency Medicine | Admitting: Emergency Medicine

## 2016-11-11 DIAGNOSIS — I10 Essential (primary) hypertension: Secondary | ICD-10-CM | POA: Diagnosis not present

## 2016-11-11 DIAGNOSIS — R110 Nausea: Secondary | ICD-10-CM | POA: Insufficient documentation

## 2016-11-11 DIAGNOSIS — Z7982 Long term (current) use of aspirin: Secondary | ICD-10-CM | POA: Diagnosis not present

## 2016-11-11 DIAGNOSIS — J45909 Unspecified asthma, uncomplicated: Secondary | ICD-10-CM | POA: Diagnosis not present

## 2016-11-11 LAB — COMPREHENSIVE METABOLIC PANEL
ALBUMIN: 3.8 g/dL (ref 3.5–5.0)
ALT: 31 U/L (ref 14–54)
ANION GAP: 7 (ref 5–15)
AST: 34 U/L (ref 15–41)
Alkaline Phosphatase: 89 U/L (ref 38–126)
BUN: 13 mg/dL (ref 6–20)
CO2: 25 mmol/L (ref 22–32)
Calcium: 9 mg/dL (ref 8.9–10.3)
Chloride: 105 mmol/L (ref 101–111)
Creatinine, Ser: 0.62 mg/dL (ref 0.44–1.00)
GFR calc Af Amer: >60 mL/min (ref >60)
GFR calc non Af Amer: >60 mL/min (ref >60)
GLUCOSE: 103 mg/dL — AB (ref 65–99)
Potassium: 3.5 mmol/L (ref 3.5–5.1)
SODIUM: 137 mmol/L (ref 135–145)
Total Bilirubin: 0.6 mg/dL (ref 0.3–1.2)
Total Protein: 7.6 g/dL (ref 6.5–8.1)

## 2016-11-11 LAB — URINALYSIS, ROUTINE W REFLEX MICROSCOPIC
BILIRUBIN URINE: NEGATIVE
Glucose, UA: NEGATIVE mg/dL
HGB URINE DIPSTICK: NEGATIVE
Ketones, ur: NEGATIVE mg/dL
Leukocytes, UA: NEGATIVE
NITRITE: NEGATIVE
PH: 5 (ref 5.0–8.0)
Protein, ur: NEGATIVE mg/dL
SPECIFIC GRAVITY, URINE: 1.009 (ref 1.005–1.030)

## 2016-11-11 LAB — CBC
HEMATOCRIT: 40.1 % (ref 36.0–46.0)
HEMOGLOBIN: 13.4 g/dL (ref 12.0–15.0)
MCH: 30.1 pg (ref 26.0–34.0)
MCHC: 33.4 g/dL (ref 30.0–36.0)
MCV: 90.1 fL (ref 78.0–100.0)
Platelets: 204 10*3/uL (ref 150–400)
RBC: 4.45 MIL/uL (ref 3.87–5.11)
RDW: 12.8 % (ref 11.5–15.5)
WBC: 6.1 10*3/uL (ref 4.0–10.5)

## 2016-11-11 LAB — CULTURE, GROUP A STREP: STREP A CULTURE: NEGATIVE

## 2016-11-11 LAB — LIPASE, BLOOD: Lipase: 25 U/L (ref 11–51)

## 2016-11-11 MED ORDER — ONDANSETRON 4 MG PO TBDP: 4.0000 mg | ORAL_TABLET | Freq: Three times a day (TID) | ORAL | 0 refills | Status: DC | PRN

## 2016-11-11 MED ORDER — ONDANSETRON 4 MG PO TBDP
4.0000 mg | ORAL_TABLET | Freq: Once | ORAL | Status: AC
  Administered 2016-11-11: 4 mg via ORAL
  Filled 2016-11-11: qty 1

## 2016-11-11 NOTE — Discharge Instructions (Signed)
I suspect your elevated blood pressure recently is secondary to mild dehydration and cough medicine which can sometimes elevate your blood pressure. Continue taking your blood pressure medicines, monitor your blood pressure and follow up with your primary care doctor if your blood pressure remains elevated. Avoid foods with high salt content.   You have been prescribed Zofran for nausea. Please make sure you drink enough water to stay hydrated, you may add crystal light powder to your water to help you stay hydrated.    Return to the emergency department if you have elevated blood pressure associated with severe headache, changes in your vision, chest pain, shortness of breath, palpitations or any other concerning symptoms.

## 2016-11-11 NOTE — ED Triage Notes (Signed)
Pt complaint of continued chills and n/v/d post diagnosis and treatment for flu Wednesday.

## 2016-11-11 NOTE — ED Notes (Signed)
Bed: [facility] Expected date:  Expected time:  Means of arrival:  Comments: 

## 2016-11-11 NOTE — ED Provider Notes (Signed)
[facility] DEPT Provider Note   CSN: [id number] Arrival date & time: 11/11/16  1018     History   Chief Complaint Chief Complaint  Patient presents with  . Influenza    HPI Kerry [name] is a 76 y.o. female with pertinent past medical history of mild intermittent asthma, seasonal allergies, hypertension, hyperlipidemia and recently diagnosed influenza type a 2 days ago at PCP office presents to the emergency department concerned for elevated blood pressure readings that she took this morning after taking her metoprolol and lisinopril. Patient states that she was told at her PCPs office yesterday that her blood pressure was elevated and was encouraged to monitor her blood pressure at home daily. Patient states that she took her blood pressure twice this morning and systolic BP was 0000000 both times. Patient denies headache, blurred vision, chest pain, palpitations, shortness of breath, nausea or any other associated symptoms at the time that she took her blood pressure reading. Patient states that her flu symptoms have started to get better, she has stopped vomiting but continues to have intermittent "hot flashes", mild nausea and diarrhea. Diarrhea has been improving with imodium. Patient has been taken Delsym and another cough medicine for the last couple days for her cough. Cough has improved. Patient states that her mother died from complications of elevated blood pressure, she was concerned yesterday when her PCP told her her blood pressure was elevated and came to the emergency department because she was worried that her blood pressure was too high.  HPI  Past Medical History:  Diagnosis Date  . Allergy   . Asthma    AS A CHILD  . History of syncope   . HTN (hypertension)   . Hyperlipidemia     Patient Active Problem List   Diagnosis Date Noted  . Hyperlipidemia   . Right thyroid nodule 02/07/2013  . Right carotid bruit 07/16/2012  . Ocular migraine 11/04/2011  .  Hypercholesterolemia 06/01/2011  . HTN (hypertension)   . History of syncope   . Asthma     Past Surgical History:  Procedure Laterality Date  . ABDOMINAL HYSTERECTOMY    . APPENDECTOMY    . CHOLECYSTECTOMY      OB History    No data available       Home Medications    Prior to Admission medications   Medication Sig Start Date End Date Taking? Authorizing Provider  Ascorbic Acid (VITAMIN C PO) Take 1 tablet by mouth daily.   Yes Historical Provider, MD  aspirin 81 MG chewable tablet Chew 81 mg by mouth daily.   Yes Historical Provider, MD  benzonatate (TESSALON) 100 MG capsule Take 1-2 capsules (100-200 mg total) by mouth 3 (three) times daily as needed for cough. 11/09/16  Yes Leonie Douglas, PA-C  Cholecalciferol (VITAMIN D3) 2000 units capsule Take 2,000 Units by mouth daily.   Yes Historical Provider, MD  Coenzyme Q10 (CO Q 10 PO) Take 1 capsule by mouth daily.    Yes Historical Provider, MD  fexofenadine (ALLEGRA) 180 MG tablet Take 180 mg by mouth daily.   Yes Historical Provider, MD  ibuprofen (ADVIL,MOTRIN) 200 MG tablet Take 200 mg by mouth daily.    Yes Historical Provider, MD  lisinopril (PRINIVIL,ZESTRIL) 20 MG tablet Take 1 tablet (20 mg total) by mouth daily. 07/25/16  Yes Sueanne Margarita, MD  metoprolol succinate (TOPROL-XL) 25 MG 24 hr tablet TAKE 1 TABLET (25 MG TOTAL) BY MOUTH 2 (TWO) TIMES DAILY. 09/20/16  Yes  Sueanne Margarita, MD  Multiple Vitamin (MULTIVITAMIN) tablet Take 1 tablet by mouth daily.     Yes Historical Provider, MD  oseltamivir (TAMIFLU) 75 MG capsule Take 1 capsule (75 mg total) by mouth 2 (two) times daily. Patient taking differently: Take 75 mg by mouth 2 (two) times daily. Started 02/28 for 5 days 11/09/16  Yes Leonie Douglas, PA-C  ondansetron (ZOFRAN ODT) 4 MG disintegrating tablet Take 1 tablet (4 mg total) by mouth every 8 (eight) hours as needed for nausea or vomiting. 11/11/16   Kinnie Feil, PA-C    Family History Family History   Problem Relation Age of Onset  . Mitral valve prolapse Daughter   . Heart disease Mother   . Kidney failure Mother   . Diabetes Mother   . Dementia Father     Social History Social History  Substance Use Topics  . Smoking status: Never Smoker  . Smokeless tobacco: Never Used  . Alcohol use No     Allergies   Lovastatin and Sudafed [pseudoephedrine hcl]   Review of Systems Review of Systems  Constitutional: Positive for chills. Negative for appetite change and fever.  HENT: Negative for congestion and sore throat.   Eyes: Negative for visual disturbance.  Respiratory: Negative for cough, choking, chest tightness and shortness of breath.   Cardiovascular: Negative for chest pain, palpitations and leg swelling.  Gastrointestinal: Positive for diarrhea and nausea. Negative for abdominal pain, blood in stool, constipation and vomiting.  Genitourinary: Negative for difficulty urinating, dysuria, frequency and hematuria.  Musculoskeletal: Negative for arthralgias.  Skin: Negative for rash and wound.  Neurological: Negative for dizziness, seizures, syncope, weakness, light-headedness, numbness and headaches.  Hematological: Does not bruise/bleed easily.  Psychiatric/Behavioral: Negative.      Physical Exam Updated Vital Signs BP 188/74   Pulse 74   Temp 97.8 F (36.6 C) (Oral)   Resp 17   Wt 83.7 kg   SpO2 92%   BMI 32.19 kg/m   Physical Exam  Constitutional: She is oriented to person, place, and time. She appears well-developed and well-nourished.  HENT:  Head: Normocephalic and atraumatic.  Nose: Nose normal.  Mouth/Throat: Oropharynx is clear and moist. No oropharyngeal exudate.  Eyes: Conjunctivae and EOM are normal. Pupils are equal, round, and reactive to light.  Neck: Normal range of motion. Neck supple. No JVD present.  Cardiovascular: Normal rate, regular rhythm, normal heart sounds and intact distal pulses.   No murmur heard. SBP 169 during exam. HR 74.   Pulmonary/Chest: Effort normal and breath sounds normal. No respiratory distress. She has no wheezes. She has no rales.  Abdominal: Soft. Bowel sounds are normal. She exhibits no distension and no mass. There is no tenderness. There is no rebound and no guarding.  Musculoskeletal: Normal range of motion. She exhibits no deformity.  Lymphadenopathy:    She has no cervical adenopathy.  Neurological: She is alert and oriented to person, place, and time. No cranial nerve deficit or sensory deficit. She exhibits normal muscle tone. Coordination normal.  Skin: Skin is warm and dry. Capillary refill takes less than 2 seconds.  Psychiatric: She has a normal mood and affect. Her behavior is normal. Judgment and thought content normal.  Nursing note and vitals reviewed.    ED Treatments / Results  Labs (all labs ordered are listed, but only abnormal results are displayed) Labs Reviewed  COMPREHENSIVE METABOLIC PANEL - Abnormal; Notable for the following:       Result Value  Glucose, Bld 103 (*)    All other components within normal limits  LIPASE, BLOOD  CBC  URINALYSIS, ROUTINE W REFLEX MICROSCOPIC    EKG  EKG Interpretation None       Radiology No results found.  Procedures Procedures (including critical care time)  Medications Ordered in ED Medications  ondansetron (ZOFRAN-ODT) disintegrating tablet 4 mg (4 mg Oral Given 11/11/16 1347)     Initial Impression / Assessment and Plan / ED Course  I have reviewed the triage vital signs and the nursing notes.  Pertinent labs & imaging results that were available during my care of the patient were reviewed by me and considered in my medical decision making (see chart for details).  Clinical Course as of Nov 11 1348  Fri Nov 11, 2016  1311 Hemoglobin: 13.4 [CG]  1311 WBC: 6.1 [CG]  1311 Sodium: 137 [CG]  1311 Potassium: 3.5 [CG]  1311 Chloride: 105 [CG]  1311 Creatinine: 0.62 [CG]  1312 AST: 34 [CG]  1312 ALT: 31 [CG]    1312 Alkaline Phosphatase: 89 [CG]  1312 Ketones, ur: NEGATIVE [CG]  1312 Nitrite: NEGATIVE [CG]  1341 Talked to RN who states "she doesn't have time to go in there right now".  Patient will be discharged as soon as orthostatic vital signs are taken, zofran and fluid challenge is done.  [CG]  1350 BP: 174/72 [CG]    Clinical Course User Index [CG] Kinnie Feil, PA-C   76 year old female with pertinent past medical history of hypertension (compliant with her lisinopril and metoprolol), hyperlipidemia and recently diagnosed influenza type A by PCP 2 days ago presents to the emergency department concerned for elevated SBP readings x 2 this morning. Patient states that her PCP advised to monitor her blood pressure as it was elevated in office two days ago, per chart review her SBP at the PCP office was 170. Patient SBP this morning was 195, twice. Patient has been taking delsym and another cough medicine for her flu related cough.  Patient denies chest pain, shortness of breath, palpitations, severe/sudden headache, changes in vision or any other associated symptoms when she measured her SBP this morning. Patient took her BP 30 mins after taking her HTN medicines.  Patient's flu symptoms are improving, but pt states she has not been drinking that much water. States that water gives her heartburn. Has not had any water today. Initial SBP was 194 today in ED, this has since improved to 164 with all other vital signs within normal limits. Exam is reassuring, patient does not look dry and is not orthostatic.  RRR, lungs CTAB without signs of consolidation. No anemia, no leukocytosis, no electrolytes abnormalities or ketones or nitrates in her urine. Patient was given by mouth fluids and zofran in the ED which she tolerated without complications. I suspect recent elevated blood pressure is secondary to mild dehydration from nausea and diarrhea and cough medicine. Patient's flu symptoms are improving. Patient  will be discharged at this time with Zofran for mild nausea which is persistent but improving since influenza. Encouraged patient to drink fluids, advised her to at try add crystal light to water to help with oral rehydration. Strict ED return precautions given.  Advised patient to follow up with PCP if her BP remains elevated.  Final Clinical Impressions(s) / ED Diagnoses   Final diagnoses:  Elevated systolic blood pressure reading with diagnosis of hypertension  Nausea    New Prescriptions New Prescriptions   ONDANSETRON (ZOFRAN ODT) 4 MG  DISINTEGRATING TABLET    Take 1 tablet (4 mg total) by mouth every 8 (eight) hours as needed for nausea or vomiting.     Kinnie Feil, PA-C 11/11/16 [address], PA-C 11/11/16 [place], MD 11/11/16 1730

## 2017-01-23 ENCOUNTER — Other Ambulatory Visit: Payer: Self-pay | Admitting: Cardiology

## 2017-04-05 ENCOUNTER — Telehealth: Payer: Self-pay | Admitting: Family Medicine

## 2017-04-05 NOTE — Telephone Encounter (Signed)
PHARMACY CALLING FOR PT TO HAVE A RX FOR PREVNAR

## 2017-04-10 NOTE — Telephone Encounter (Signed)
Can you place this order? Or will she need an office visit? Please advise

## 2017-04-12 NOTE — Telephone Encounter (Signed)
Call placed to both pharmacy and patient to make aware, patient requesting to come in for CPE, lab work and needed vaccines. Appointment scheduled for Friday 04/14/17. S.Crispin Vogel,CMA

## 2017-04-12 NOTE — Telephone Encounter (Signed)
Could you please call the pharmacy and see if she needs a Rx for this? I do not believe we give Rx's for this. She can however come here to get the vaccine in office. Thanks!

## 2017-04-14 ENCOUNTER — Encounter: Payer: Self-pay | Admitting: Physician Assistant

## 2017-04-14 ENCOUNTER — Ambulatory Visit (INDEPENDENT_AMBULATORY_CARE_PROVIDER_SITE_OTHER): Payer: Medicare Other | Admitting: Physician Assistant

## 2017-04-14 VITALS — BP 156/72 | HR 67 | Temp 98.4°F | Resp 16 | Ht 62.5 in | Wt 187.2 lb

## 2017-04-14 DIAGNOSIS — Z0000 Encounter for general adult medical examination without abnormal findings: Secondary | ICD-10-CM | POA: Diagnosis not present

## 2017-04-14 DIAGNOSIS — Z23 Encounter for immunization: Secondary | ICD-10-CM

## 2017-04-14 DIAGNOSIS — Z1389 Encounter for screening for other disorder: Secondary | ICD-10-CM | POA: Diagnosis not present

## 2017-04-14 DIAGNOSIS — Z13220 Encounter for screening for lipoid disorders: Secondary | ICD-10-CM

## 2017-04-14 DIAGNOSIS — L989 Disorder of the skin and subcutaneous tissue, unspecified: Secondary | ICD-10-CM | POA: Diagnosis not present

## 2017-04-14 DIAGNOSIS — E785 Hyperlipidemia, unspecified: Secondary | ICD-10-CM

## 2017-04-14 DIAGNOSIS — Z13228 Encounter for screening for other metabolic disorders: Secondary | ICD-10-CM

## 2017-04-14 DIAGNOSIS — Z130 Encounter for screening for diseases of the blood and blood-forming organs and certain disorders involving the immune mechanism: Secondary | ICD-10-CM

## 2017-04-14 DIAGNOSIS — I10 Essential (primary) hypertension: Secondary | ICD-10-CM

## 2017-04-14 LAB — LIPID PANEL
Chol/HDL Ratio: 3.3 ratio (ref 0.0–4.4)
Cholesterol, Total: 201 mg/dL — ABNORMAL HIGH (ref 100–199)
HDL: 61 mg/dL (ref >39)
LDL Calculated: 119 mg/dL — ABNORMAL HIGH (ref 0–99)
TRIGLYCERIDES: 104 mg/dL (ref 0–149)
VLDL CHOLESTEROL CAL: 21 mg/dL (ref 5–40)

## 2017-04-14 LAB — CMP14+EGFR
A/G RATIO: 1.4 (ref 1.2–2.2)
ALT: 15 IU/L (ref 0–32)
AST: 22 IU/L (ref 0–40)
Albumin: 4.3 g/dL (ref 3.5–4.8)
Alkaline Phosphatase: 97 IU/L (ref 39–117)
BILIRUBIN TOTAL: 0.5 mg/dL (ref 0.0–1.2)
BUN/Creatinine Ratio: 28 (ref 12–28)
BUN: 19 mg/dL (ref 8–27)
CALCIUM: 10.1 mg/dL (ref 8.7–10.3)
CHLORIDE: 103 mmol/L (ref 96–106)
CO2: 24 mmol/L (ref 20–29)
Creatinine, Ser: 0.69 mg/dL (ref 0.57–1.00)
GFR calc Af Amer: 98 mL/min/{1.73_m2} (ref >59)
GFR calc non Af Amer: 85 mL/min/{1.73_m2} (ref >59)
GLUCOSE: 87 mg/dL (ref 65–99)
Globulin, Total: 3.1 g/dL (ref 1.5–4.5)
POTASSIUM: 4.9 mmol/L (ref 3.5–5.2)
Sodium: 141 mmol/L (ref 134–144)
TOTAL PROTEIN: 7.4 g/dL (ref 6.0–8.5)

## 2017-04-14 LAB — CBC WITH DIFFERENTIAL/PLATELET
BASOS ABS: 0.1 10*3/uL (ref 0.0–0.2)
BASOS: 1 %
EOS (ABSOLUTE): 0.6 10*3/uL — AB (ref 0.0–0.4)
Eos: 7 %
Hematocrit: 41.3 % (ref 34.0–46.6)
Hemoglobin: 14.2 g/dL (ref 11.1–15.9)
IMMATURE GRANS (ABS): 0 10*3/uL (ref 0.0–0.1)
IMMATURE GRANULOCYTES: 0 %
LYMPHS: 26 %
Lymphocytes Absolute: 2.1 10*3/uL (ref 0.7–3.1)
MCH: 31.1 pg (ref 26.6–33.0)
MCHC: 34.4 g/dL (ref 31.5–35.7)
MCV: 90 fL (ref 79–97)
MONOS ABS: 0.7 10*3/uL (ref 0.1–0.9)
Monocytes: 8 %
NEUTROS PCT: 58 %
Neutrophils Absolute: 4.7 10*3/uL (ref 1.4–7.0)
PLATELETS: 242 10*3/uL (ref 150–379)
RBC: 4.57 x10E6/uL (ref 3.77–5.28)
RDW: 13.4 % (ref 12.3–15.4)
WBC: 8 10*3/uL (ref 3.4–10.8)

## 2017-04-14 LAB — POCT URINALYSIS DIP (MANUAL ENTRY)
BILIRUBIN UA: NEGATIVE
BILIRUBIN UA: NEGATIVE mg/dL
Blood, UA: NEGATIVE
GLUCOSE UA: NEGATIVE mg/dL
LEUKOCYTES UA: NEGATIVE
Nitrite, UA: NEGATIVE
Protein Ur, POC: NEGATIVE mg/dL
SPEC GRAV UA: 1.01 (ref 1.010–1.025)
Urobilinogen, UA: 0.2 E.U./dL
pH, UA: 6.5 (ref 5.0–8.0)

## 2017-04-14 NOTE — Progress Notes (Signed)
   Presents today for Medicare Annual Wellness Visit-Subsequent.  Pt is fasting today.   Date of last exam: Years ago, cannot provide a date  Interpreter used for this visit? no  Patient Care Team: McComb, John, MD as Consulting Physician (Obstetrics and Gynecology) Turner, Traci R, MD as Consulting Physician (Cardiology)  Other items to address today: none   Cancer Screening: Cervical: Up to date, sees gyencology, last appointment was in 02/2017, normal. Breast: yes, 02/2017, normal Colon: yes, 07/2007, normal, has one scheduled in 07/2017 Skin: no  Other Screening: Last screening for diabetes: Never Last lipid screening: 07/2016, normal   ADVANCE DIRECTIVES: Discussed: Yes Patient needs to think about if she desires CPR, mechanical ventilation, and prolonged artificial support. On File: no Materials Provided: yes  Immunization status:  Immunization History  Administered Date(s) Administered  . Influenza Split 06/19/2014  . Influenza-Unspecified 05/31/2015, 06/13/2016  . Pneumococcal Conjugate-13 04/14/2017  . Pneumococcal Polysaccharide-23 06/28/2009     Health Maintenance Due  Topic Date Due  . TETANUS/TDAP  11/23/1960  . PNA vac Low Risk Adult (1 of 2 - PCV13) 11/24/2006  . INFLUENZA VACCINE  04/12/2017   She is up to date on Pneumovax 23 (recieved in 2010). She thinks she had a Tdap <10 years ago but will have to go home and go through her files to see.    Functional Status Survey: Is the patient deaf or have difficulty hearing?: No Does the patient have difficulty seeing, even when wearing glasses/contacts?: No Does the patient have difficulty concentrating, remembering, or making decisions?: No Does the patient have difficulty walking or climbing stairs?: No Does the patient have difficulty dressing or bathing?: No Does the patient have difficulty doing errands alone such as visiting a doctor's office or shopping?: No   Home Environment: Lives at  home alone. Feels safe at home. Has 2 daughters (one in [place] and one in [place]).   Urinary Incontinence Screening: Has stress and urge incontinence. She has discussed this with gynecology.   Exercise: Walks about 2 miles a day. Has stationary cycle at home she uses.   Diet: Yogurt, cheese, omelette, sandwiches, chicken, fish, vegetables, and fruits. Drinks mostly water. Will drink hot tea. Has been drinking more soda lately.   Patient Active Problem List   Diagnosis Date Noted  . Hyperlipidemia   . Right thyroid nodule 02/07/2013  . Right carotid bruit 07/16/2012  . Ocular migraine 11/04/2011  . Hypercholesterolemia 06/01/2011  . HTN (hypertension)   . History of syncope   . Asthma      Past Medical History:  Diagnosis Date  . Allergy   . Asthma    AS A CHILD  . History of syncope   . HTN (hypertension)   . Hyperlipidemia      Past Surgical History:  Procedure Laterality Date  . ABDOMINAL HYSTERECTOMY    . APPENDECTOMY    . CHOLECYSTECTOMY       Family History  Problem Relation Age of Onset  . Mitral valve prolapse Daughter   . Heart disease Mother   . Kidney failure Mother   . Diabetes Mother   . Cancer Mother        uterine  . Hyperlipidemia Mother   . Hypertension Mother   . Dementia Father   . Diabetes Sister   . Diabetes Paternal Grandmother      Social History   Social History  . Marital status: Widowed    Spouse name: N/A  . Number   of children: N/A  . Years of education: N/A   Occupational History  . Not on file.   Social History Main Topics  . Smoking status: Never Smoker  . Smokeless tobacco: Never Used  . Alcohol use No  . Drug use: No  . Sexual activity: No   Other Topics Concern  . Not on file   Social History Narrative  . No narrative on file     Allergies  Allergen Reactions  . Lovastatin Other (See Comments)    myalgias  . Sudafed [Pseudoephedrine Hcl] Other (See Comments)    Hallucinations      Prior to  Admission medications   Medication Sig Start Date End Date Taking? Authorizing Provider  Ascorbic Acid (VITAMIN C PO) Take 1 tablet by mouth daily.   Yes [provider]  aspirin 81 MG chewable tablet Chew 81 mg by mouth daily.   Yes [provider]  Cholecalciferol (VITAMIN D3) 2000 units capsule Take 2,000 Units by mouth daily.   Yes [provider]  Coenzyme Q10 (CO Q 10 PO) Take 1 capsule by mouth daily.    Yes [provider]  fexofenadine (ALLEGRA) 180 MG tablet Take 180 mg by mouth daily.   Yes [provider]  ibuprofen (ADVIL,MOTRIN) 200 MG tablet Take 200 mg by mouth daily.    Yes [provider]  lisinopril (PRINIVIL,ZESTRIL) 20 MG tablet TAKE 1 TABLET (20 MG TOTAL) BY MOUTH DAILY. 01/24/17  Yes Turner, Traci R, MD  metoprolol succinate (TOPROL-XL) 25 MG 24 hr tablet TAKE 1 TABLET (25 MG TOTAL) BY MOUTH 2 (TWO) TIMES DAILY. 09/20/16  Yes Turner, Traci R, MD  Multiple Vitamin (MULTIVITAMIN) tablet Take 1 tablet by mouth daily.     Yes [provider]     Depression screen PHQ 2/9 04/14/2017 04/14/2017 11/09/2016 08/24/2015  Decreased Interest 0 0 0 0  Down, Depressed, Hopeless 0 0 0 0  PHQ - 2 Score 0 0 0 0    Fall Risk  04/14/2017 11/09/2016  Falls in the past year? No No    PHYSICAL EXAM: BP (!) 156/72   Pulse 67   Temp 98.4 F (36.9 C) (Oral)   Resp 16   Ht 5' 2.5" (1.588 m)   Wt 187 lb 3.2 oz (84.9 kg)   SpO2 96%   BMI 33.69 kg/m    Wt Readings from Last 3 Encounters:  04/14/17 187 lb 3.2 oz (84.9 kg)  11/11/16 184 lb 9.6 oz (83.7 kg)  11/09/16 187 lb (84.8 kg)      Visual Acuity Screening   Right eye Left eye Both eyes  Without correction:     With correction: 20/25 20/30 20/20    Physical Exam  Constitutional: She is oriented to person, place, and time. She appears well-developed and well-nourished. No distress.  HENT:  Head: Normocephalic and atraumatic.  Eyes: Pupils are equal, round, and  reactive to light. Conjunctivae, EOM and lids are normal.  Fundoscopic exam:      The right eye shows no AV nicking. The right eye shows red reflex.       The left eye shows no AV nicking. The left eye shows red reflex.  Neck: Normal range of motion. Neck supple. No thyromegaly present.  Cardiovascular: Normal rate, regular rhythm, normal heart sounds and intact distal pulses.   Respiratory: Effort normal and breath sounds normal. She has no wheezes. She has no rales.  GI: Soft. Bowel sounds are normal. There is   no tenderness.  Musculoskeletal: Normal range of motion.  Lymphadenopathy:       Head (right side): No submental, no submandibular, no tonsillar, no preauricular, no posterior auricular and no occipital adenopathy present.       Head (left side): No submental, no submandibular, no tonsillar, no preauricular, no posterior auricular and no occipital adenopathy present.    She has no cervical adenopathy.       Right: No supraclavicular adenopathy present.       Left: No supraclavicular adenopathy present.  Neurological: She is alert and oriented to person, place, and time.  Skin: Skin is warm and dry.  One ~2.5 cm oval shaped multi colored (light brown to dark brown) macule on left cheek.  One ~3mm whitish scaly papule with erythematous bordered noted on forehead.   Psychiatric: She has a normal mood and affect.    Results for orders placed or performed in visit on 04/14/17 (from the past 24 hour(s))  POCT urinalysis dipstick     Status: None   Collection Time: 04/14/17 11:00 AM  Result Value Ref Range   Color, UA yellow yellow   Clarity, UA clear clear   Glucose, UA negative negative mg/dL   Bilirubin, UA negative negative   Ketones, POC UA negative negative mg/dL   Spec Grav, UA 1.010 1.010 - 1.025   Blood, UA negative negative   pH, UA 6.5 5.0 - 8.0   Protein Ur, POC negative negative mg/dL   Urobilinogen, UA 0.2 0.2 or 1.0 E.U./dL   Nitrite, UA Negative Negative    Leukocytes, UA Negative Negative     Education/Counseling provided regarding diet and exercise, prevention of chronic diseases, smoking/tobacco cessation, if applicable, and reviewed "Covered Medicare Preventive Services."   ASSESSMENT/PLAN: 1. Medicare annual wellness visit, subsequent Await lab results. Follow up in one year 2. Essential hypertension - CMP14+EGFR  3. Hyperlipidemia, unspecified hyperlipidemia type - Lipid panel 4. Screening, anemia, deficiency, iron - CBC with Differential/Platelet 5. Screening, lipid - Lipid panel 6. Screening for hematuria or proteinuria - POCT urinalysis dipstick 7. Screening for metabolic disorder - CMP14+EGFR  8. Need for pneumococcal vaccine - Pneumococcal conjugate vaccine 13-valent IM 9. Skin lesion - Ambulatory referral to Dermatology  Brittany Wiseman, PA-C  [facility] 04/14/2017 11:33 AM   

## 2017-04-14 NOTE — Patient Instructions (Addendum)
Find out if you have had a Tdap in the past 10 years. Contact our office if you have and give [place] the date. If you have not, return to clinic for Tdap. Also, please return your advance directive information back to our office when it is complete.  In terms of your health, you are doing great! I do recommend you follow up with dermatology for your skin lesions. I have placed a referral and they should contact you within 2 weeks with an appointment. Your lab work today will return within a week and we will notify you with these results. Otherwise, keep up the good work and plan for follow up in one year. Thank you for letting me participate in your health and well being.    Health Maintenance for Postmenopausal Women Menopause is a normal process in which your reproductive ability comes to an end. This process happens gradually over a span of months to years, usually between the ages of 64 and 74. Menopause is complete when you have missed 12 consecutive menstrual periods. It is important to talk with your health care provider about some of the most common conditions that affect postmenopausal women, such as heart disease, cancer, and bone loss (osteoporosis). Adopting a healthy lifestyle and getting preventive care can help to promote your health and wellness. Those actions can also lower your chances of developing some of these common conditions. What should I know about menopause? During menopause, you may experience a number of symptoms, such as:  Moderate-to-severe hot flashes.  Night sweats.  Decrease in sex drive.  Mood swings.  Headaches.  Tiredness.  Irritability.  Memory problems.  Insomnia.  Choosing to treat or not to treat menopausal changes is an individual decision that you make with your health care provider. What should I know about hormone replacement therapy and supplements? Hormone therapy products are effective for treating symptoms that are associated with  menopause, such as hot flashes and night sweats. Hormone replacement carries certain risks, especially as you become older. If you are thinking about using estrogen or estrogen with progestin treatments, discuss the benefits and risks with your health care provider. What should I know about heart disease and stroke? Heart disease, heart attack, and stroke become more likely as you age. This may be due, in part, to the hormonal changes that your body experiences during menopause. These can affect how your body processes dietary fats, triglycerides, and cholesterol. Heart attack and stroke are both medical emergencies. There are many things that you can do to help prevent heart disease and stroke:  Have your blood pressure checked at least every 1-2 years. High blood pressure causes heart disease and increases the risk of stroke.  If you are 37-53 years old, ask your health care provider if you should take aspirin to prevent a heart attack or a stroke.  Do not use any tobacco products, including cigarettes, chewing tobacco, or electronic cigarettes. If you need help quitting, ask your health care provider.  It is important to eat a healthy diet and maintain a healthy weight. ? Be sure to include plenty of vegetables, fruits, low-fat dairy products, and lean protein. ? Avoid eating foods that are high in solid fats, added sugars, or salt (sodium).  Get regular exercise. This is one of the most important things that you can do for your health. ? Try to exercise for at least 150 minutes each week. The type of exercise that you do should increase your heart rate and  make you sweat. This is known as moderate-intensity exercise. ? Try to do strengthening exercises at least twice each week. Do these in addition to the moderate-intensity exercise.  Know your numbers.Ask your health care provider to check your cholesterol and your blood glucose. Continue to have your blood tested as directed by your health  care provider.  What should I know about cancer screening? There are several types of cancer. Take the following steps to reduce your risk and to catch any cancer development as early as possible. Breast Cancer  Practice breast self-awareness. ? This means understanding how your breasts normally appear and feel. ? It also means doing regular breast self-exams. Let your health care provider know about any changes, no matter how small.  If you are 39 or older, have a clinician do a breast exam (clinical breast exam or CBE) every year. Depending on your age, family history, and medical history, it may be recommended that you also have a yearly breast X-ray (mammogram).  If you have a family history of breast cancer, talk with your health care provider about genetic screening.  If you are at high risk for breast cancer, talk with your health care provider about having an MRI and a mammogram every year.  Breast cancer (BRCA) gene test is recommended for women who have family members with BRCA-related cancers. Results of the assessment will determine the need for genetic counseling and BRCA1 and for BRCA2 testing. BRCA-related cancers include these types: ? Breast. This occurs in males or females. ? Ovarian. ? Tubal. This may also be called fallopian tube cancer. ? Cancer of the abdominal or pelvic lining (peritoneal cancer). ? Prostate. ? Pancreatic.  Cervical, Uterine, and Ovarian Cancer Your health care provider may recommend that you be screened regularly for cancer of the pelvic organs. These include your ovaries, uterus, and vagina. This screening involves a pelvic exam, which includes checking for microscopic changes to the surface of your cervix (Pap test).  For women ages 21-65, health care providers may recommend a pelvic exam and a Pap test every three years. For women ages 48-65, they may recommend the Pap test and pelvic exam, combined with testing for human papilloma virus (HPV),  every five years. Some types of HPV increase your risk of cervical cancer. Testing for HPV may also be done on women of any age who have unclear Pap test results.  Other health care providers may not recommend any screening for nonpregnant women who are considered low risk for pelvic cancer and have no symptoms. Ask your health care provider if a screening pelvic exam is right for you.  If you have had past treatment for cervical cancer or a condition that could lead to cancer, you need Pap tests and screening for cancer for at least 20 years after your treatment. If Pap tests have been discontinued for you, your risk factors (such as having a new sexual partner) need to be reassessed to determine if you should start having screenings again. Some women have medical problems that increase the chance of getting cervical cancer. In these cases, your health care provider may recommend that you have screening and Pap tests more often.  If you have a family history of uterine cancer or ovarian cancer, talk with your health care provider about genetic screening.  If you have vaginal bleeding after reaching menopause, tell your health care provider.  There are currently no reliable tests available to screen for ovarian cancer.  Lung Cancer Lung cancer  screening is recommended for adults 26-5 years old who are at high risk for lung cancer because of a history of smoking. A yearly low-dose CT scan of the lungs is recommended if you:  Currently smoke.  Have a history of at least 30 pack-years of smoking and you currently smoke or have quit within the past 15 years. A pack-year is smoking an average of one pack of cigarettes per day for one year.  Yearly screening should:  Continue until it has been 15 years since you quit.  Stop if you develop a health problem that would prevent you from having lung cancer treatment.  Colorectal Cancer  This type of cancer can be detected and can often be  prevented.  Routine colorectal cancer screening usually begins at age 85 and continues through age 20.  If you have risk factors for colon cancer, your health care provider may recommend that you be screened at an earlier age.  If you have a family history of colorectal cancer, talk with your health care provider about genetic screening.  Your health care provider may also recommend using home test kits to check for hidden blood in your stool.  A small camera at the end of a tube can be used to examine your colon directly (sigmoidoscopy or colonoscopy). This is done to check for the earliest forms of colorectal cancer.  Direct examination of the colon should be repeated every 5-10 years until age 42. However, if early forms of precancerous polyps or small growths are found or if you have a family history or genetic risk for colorectal cancer, you may need to be screened more often.  Skin Cancer  Check your skin from head to toe regularly.  Monitor any moles. Be sure to tell your health care provider: ? About any new moles or changes in moles, especially if there is a change in a mole's shape or color. ? If you have a mole that is larger than the size of a pencil eraser.  If any of your family members has a history of skin cancer, especially at a young age, talk with your health care provider about genetic screening.  Always use sunscreen. Apply sunscreen liberally and repeatedly throughout the day.  Whenever you are outside, protect yourself by wearing long sleeves, pants, a wide-brimmed hat, and sunglasses.  What should I know about osteoporosis? Osteoporosis is a condition in which bone destruction happens more quickly than new bone creation. After menopause, you may be at an increased risk for osteoporosis. To help prevent osteoporosis or the bone fractures that can happen because of osteoporosis, the following is recommended:  If you are 59-13 years old, get at least 1,000 mg of  calcium and at least 600 mg of vitamin D per day.  If you are older than age 24 but younger than age 51, get at least 1,200 mg of calcium and at least 600 mg of vitamin D per day.  If you are older than age 23, get at least 1,200 mg of calcium and at least 800 mg of vitamin D per day.  Smoking and excessive alcohol intake increase the risk of osteoporosis. Eat foods that are rich in calcium and vitamin D, and do weight-bearing exercises several times each week as directed by your health care provider. What should I know about how menopause affects my mental health? Depression may occur at any age, but it is more common as you become older. Common symptoms of depression include:  Low  or sad mood.  Changes in sleep patterns.  Changes in appetite or eating patterns.  Feeling an overall lack of motivation or enjoyment of activities that you previously enjoyed.  Frequent crying spells.  Talk with your health care provider if you think that you are experiencing depression. What should I know about immunizations? It is important that you get and maintain your immunizations. These include:  Tetanus, diphtheria, and pertussis (Tdap) booster vaccine.  Influenza every year before the flu season begins.  Pneumonia vaccine.  Shingles vaccine.  Your health care provider may also recommend other immunizations. This information is not intended to replace advice given to you by your health care provider. Make sure you discuss any questions you have with your health care provider. Document Released: 10/21/2005 Document Revised: 03/18/2016 Document Reviewed: 06/02/2015 Elsevier Interactive Patient Education  2018 [employer].   IF you received an x-ray today, you will receive an invoice from [facility]. Please contact [facility] at [phone] with questions or concerns regarding your invoice.   IF you received labwork today, you will receive an invoice from [facility].  Please contact [facility] at [phone] with questions or concerns regarding your invoice.   Our billing staff will not be able to assist you with questions regarding bills from these companies.  You will be contacted with the lab results as soon as they are available. The fastest way to get your results is to activate your My Chart account. Instructions are located on the last page of this paperwork. If you have not heard from [place] regarding the results in 2 weeks, please contact this office.

## 2017-04-14 NOTE — Progress Notes (Deleted)
Kerry [name]  MRN: [id number] DOB: [birth year]  Subjective:  @NAMEBYAGE @ is a 76 y.o. female who presents for annual physical exam and ***.  Social: Diet:  Exercise:  Sleep:  Bowel Movements: Menstrual cycle:   Last dental exam:  Last vision exam: Last pap smear: Last mammogram: Last colonoscopy: Vaccinations      Tetanus      HPV      Zostavax  Patient Active Problem List   Diagnosis Date Noted  . Hyperlipidemia   . Right thyroid nodule 02/07/2013  . Right carotid bruit 07/16/2012  . Ocular migraine 11/04/2011  . Hypercholesterolemia 06/01/2011  . HTN (hypertension)   . History of syncope   . Asthma     Current Outpatient Prescriptions on File Prior to Visit  Medication Sig Dispense Refill  . Ascorbic Acid (VITAMIN C PO) Take 1 tablet by mouth daily.    Marland Kitchen aspirin 81 MG chewable tablet Chew 81 mg by mouth daily.    . Cholecalciferol (VITAMIN D3) 2000 units capsule Take 2,000 Units by mouth daily.    . Coenzyme Q10 (CO Q 10 PO) Take 1 capsule by mouth daily.     . fexofenadine (ALLEGRA) 180 MG tablet Take 180 mg by mouth daily.    Marland Kitchen ibuprofen (ADVIL,MOTRIN) 200 MG tablet Take 200 mg by mouth daily.     Marland Kitchen lisinopril (PRINIVIL,ZESTRIL) 20 MG tablet TAKE 1 TABLET (20 MG TOTAL) BY MOUTH DAILY. 90 tablet 1  . metoprolol succinate (TOPROL-XL) 25 MG 24 hr tablet TAKE 1 TABLET (25 MG TOTAL) BY MOUTH 2 (TWO) TIMES DAILY. 180 tablet 3  . Multiple Vitamin (MULTIVITAMIN) tablet Take 1 tablet by mouth daily.       No current facility-administered medications on file prior to visit.     Allergies  Allergen Reactions  . Lovastatin Other (See Comments)    myalgias  . Sudafed [Pseudoephedrine Hcl] Other (See Comments)    Hallucinations     Social History   Social History  . Marital status: Widowed    Spouse name: N/A  . Number of children: N/A  . Years of education: N/A   Social History Main Topics  . Smoking status: Never Smoker  . Smokeless tobacco: Never  Used  . Alcohol use No  . Drug use: No  . Sexual activity: Not Asked   Other Topics Concern  . None   Social History Narrative  . None    Past Surgical History:  Procedure Laterality Date  . ABDOMINAL HYSTERECTOMY    . APPENDECTOMY    . CHOLECYSTECTOMY      Family History  Problem Relation Age of Onset  . Mitral valve prolapse Daughter   . Heart disease Mother   . Kidney failure Mother   . Diabetes Mother   . Dementia Father     Review of Systems  Objective:  BP (!) 156/72   Pulse 67   Temp 98.4 F (36.9 C) (Oral)   Resp 16   Ht 5' 2.5" (1.588 m)   Wt 187 lb 3.2 oz (84.9 kg)   SpO2 96%   BMI 33.69 kg/m   Physical Exam  Visual Acuity Screening   Right eye Left eye Both eyes  Without correction:     With correction: 20/25 20/30 20/20     Assessment and Plan :  Discussed healthy lifestyle, diet, exercise, preventative care, vaccinations, and addressed patient's concerns. Plan for follow up in ***. Otherwise, plan for specific conditions below.  There are  no diagnoses linked to this encounter.  Tenna Delaine PA-C  [facility] 04/14/2017 9:18 AM

## 2017-05-16 DIAGNOSIS — H10413 Chronic giant papillary conjunctivitis, bilateral: Secondary | ICD-10-CM | POA: Diagnosis not present

## 2017-05-16 DIAGNOSIS — H43813 Vitreous degeneration, bilateral: Secondary | ICD-10-CM | POA: Diagnosis not present

## 2017-05-16 DIAGNOSIS — H04123 Dry eye syndrome of bilateral lacrimal glands: Secondary | ICD-10-CM | POA: Diagnosis not present

## 2017-05-16 DIAGNOSIS — H25813 Combined forms of age-related cataract, bilateral: Secondary | ICD-10-CM | POA: Diagnosis not present

## 2017-06-05 DIAGNOSIS — D0339 Melanoma in situ of other parts of face: Secondary | ICD-10-CM | POA: Diagnosis not present

## 2017-06-05 DIAGNOSIS — L570 Actinic keratosis: Secondary | ICD-10-CM | POA: Diagnosis not present

## 2017-06-05 DIAGNOSIS — D0439 Carcinoma in situ of skin of other parts of face: Secondary | ICD-10-CM | POA: Diagnosis not present

## 2017-06-05 DIAGNOSIS — D485 Neoplasm of uncertain behavior of skin: Secondary | ICD-10-CM | POA: Diagnosis not present

## 2017-06-05 DIAGNOSIS — Z23 Encounter for immunization: Secondary | ICD-10-CM | POA: Diagnosis not present

## 2017-06-29 DIAGNOSIS — L989 Disorder of the skin and subcutaneous tissue, unspecified: Secondary | ICD-10-CM | POA: Diagnosis not present

## 2017-06-29 DIAGNOSIS — D0339 Melanoma in situ of other parts of face: Secondary | ICD-10-CM | POA: Diagnosis not present

## 2017-07-27 ENCOUNTER — Other Ambulatory Visit: Payer: Self-pay | Admitting: Cardiology

## 2017-08-11 ENCOUNTER — Encounter: Payer: Self-pay | Admitting: Internal Medicine

## 2017-08-24 DIAGNOSIS — H5703 Miosis: Secondary | ICD-10-CM | POA: Diagnosis not present

## 2017-08-24 DIAGNOSIS — H25813 Combined forms of age-related cataract, bilateral: Secondary | ICD-10-CM | POA: Diagnosis not present

## 2017-09-07 ENCOUNTER — Encounter (INDEPENDENT_AMBULATORY_CARE_PROVIDER_SITE_OTHER): Payer: Self-pay

## 2017-09-07 ENCOUNTER — Encounter: Payer: Self-pay | Admitting: Cardiology

## 2017-09-07 ENCOUNTER — Telehealth (HOSPITAL_COMMUNITY): Payer: Self-pay | Admitting: *Deleted

## 2017-09-07 ENCOUNTER — Ambulatory Visit: Payer: Medicare Other | Admitting: Cardiology

## 2017-09-07 VITALS — BP 142/78 | HR 66 | Ht 62.5 in | Wt 194.2 lb

## 2017-09-07 DIAGNOSIS — R0609 Other forms of dyspnea: Secondary | ICD-10-CM | POA: Diagnosis not present

## 2017-09-07 DIAGNOSIS — R0602 Shortness of breath: Secondary | ICD-10-CM | POA: Diagnosis not present

## 2017-09-07 DIAGNOSIS — R600 Localized edema: Secondary | ICD-10-CM | POA: Diagnosis not present

## 2017-09-07 DIAGNOSIS — I10 Essential (primary) hypertension: Secondary | ICD-10-CM

## 2017-09-07 DIAGNOSIS — R0600 Dyspnea, unspecified: Secondary | ICD-10-CM

## 2017-09-07 DIAGNOSIS — E7800 Pure hypercholesterolemia, unspecified: Secondary | ICD-10-CM

## 2017-09-07 MED ORDER — HYDROCHLOROTHIAZIDE 25 MG PO TABS: 25.0000 mg | ORAL_TABLET | Freq: Every day | ORAL | 3 refills | Status: DC

## 2017-09-07 NOTE — Patient Instructions (Addendum)
Medication Instructions:  Your physician has recommended you make the following change in your medication:   START: HCTZ 25 mg once a day   Labwork: Return in 1 week for a kidney function test  Testing/Procedures: Your physician has requested that you have an echocardiogram. Echocardiography is a painless test that uses sound waves to create images of your heart. It provides your doctor with information about the size and shape of your heart and how well your heart's chambers and valves are working. This procedure takes approximately one hour. There are no restrictions for this procedure.  Your physician has requested that you have en exercise stress myoview. For further information please visit [email or web address]. Please follow instruction sheet, as given.   Follow-Up: Your physician wants you to follow-up in: 1 year with Dr. Radford Pax.  You will receive a reminder letter in the mail two months in advance. If you don't receive a letter, please call our office to schedule the follow-up appointment.  Any Other Special Instructions Will Be Listed Below (If Applicable).     If you need a refill on your cardiac medications before your next appointment, please call your pharmacy.

## 2017-09-07 NOTE — Progress Notes (Signed)
Cardiology Office Note:    Date:  09/07/2017   ID:  Kerry [name], DOB September 03, 1941, MRN [id number]  PCP:  System, Pcp Not In  Cardiologist:  Fransico Him, MD   Referring MD: No ref. provider found   Chief Complaint  Patient presents with  . Follow-up    HTN, syncope    History of Present Illness:    Kerry [name] is a 76 y.o. female with a hx of HTN, syncope and hyperlipidemia (statin intolerant). She is here today for followup and is doing well.  She denies any chest pain or pressure,  PND, orthopnea, LE edema, dizziness, palpitations or syncope. She says that she has noticed that she has been having SOB when she goes out to walk that started after it got cold.  She is also having a lot of plhlegm.  She has a history of asthma but has not had problems in 5-6 years.  She is compliant with her meds and is tolerating meds with no SE.     Past Medical History:  Diagnosis Date  . Allergy   . Asthma    AS A CHILD  . History of syncope   . HTN (hypertension)   . Hyperlipidemia     Past Surgical History:  Procedure Laterality Date  . ABDOMINAL HYSTERECTOMY    . APPENDECTOMY    . CHOLECYSTECTOMY      Current Medications: Current Meds  Medication Sig  . Ascorbic Acid (VITAMIN C PO) Take 1 tablet by mouth daily.  Marland Kitchen aspirin 81 MG chewable tablet Chew 81 mg by mouth daily.  . Cholecalciferol (VITAMIN D3) 2000 units capsule Take 2,000 Units by mouth daily.  . Coenzyme Q10 (CO Q 10 PO) Take 1 capsule by mouth daily.   . fexofenadine (ALLEGRA) 180 MG tablet Take 180 mg by mouth daily.  Marland Kitchen ibuprofen (ADVIL,MOTRIN) 200 MG tablet Take 200 mg by mouth daily.   Marland Kitchen lisinopril (PRINIVIL,ZESTRIL) 20 MG tablet Take 1 tablet (20 mg total) daily by mouth.  . metoprolol succinate (TOPROL-XL) 25 MG 24 hr tablet TAKE 1 TABLET (25 MG TOTAL) BY MOUTH 2 (TWO) TIMES DAILY.  . Multiple Vitamin (MULTIVITAMIN) tablet Take 1 tablet by mouth daily.       Allergies:   Lovastatin and Sudafed  [pseudoephedrine hcl]   Social History   Socioeconomic History  . Marital status: Widowed    Spouse name: None  . Number of children: None  . Years of education: None  . Highest education level: None  Social Needs  . Financial resource strain: None  . Food insecurity - worry: None  . Food insecurity - inability: None  . Transportation needs - medical: None  . Transportation needs - non-medical: None  Occupational History  . None  Tobacco Use  . Smoking status: Never Smoker  . Smokeless tobacco: Never Used  Substance and Sexual Activity  . Alcohol use: No  . Drug use: No  . Sexual activity: No  Other Topics Concern  . None  Social History Narrative  . None     Family History: The patient's family history includes Cancer in her mother; Dementia in her father; Diabetes in her mother, paternal grandmother, and sister; Heart disease in her mother; Hyperlipidemia in her mother; Hypertension in her mother; Kidney failure in her mother; Mitral valve prolapse in her daughter.  ROS:   Please see the history of present illness.    ROS  All other systems reviewed and negative.   EKGs/Labs/Other  Studies Reviewed:    The following studies were reviewed today: none  EKG:  EKG is  ordered today.  The ekg ordered today demonstrates NSR with LVH by voltage  Recent Labs: 04/14/2017: ALT 15; BUN 19; Creatinine, Ser 0.69; Hemoglobin 14.2; Platelets 242; Potassium 4.9; Sodium 141   Recent Lipid Panel    Component Value Date/Time   CHOL 201 (H) 04/14/2017 1041   TRIG 104 04/14/2017 1041   HDL 61 04/14/2017 1041   CHOLHDL 3.3 04/14/2017 1041   CHOLHDL 2.9 08/18/2016 0849   VLDL 14 08/18/2016 0849   LDLCALC 119 (H) 04/14/2017 1041   LDLDIRECT 140.3 06/01/2011 1019    Physical Exam:    VS:  BP (!) 142/78   Pulse 66   Ht 5' 2.5" (1.588 m)   Wt 194 lb 3.2 oz (88.1 kg)   BMI 34.95 kg/m     Wt Readings from Last 3 Encounters:  09/07/17 194 lb 3.2 oz (88.1 kg)  04/14/17 187 lb  3.2 oz (84.9 kg)  11/11/16 184 lb 9.6 oz (83.7 kg)     GEN:  Well nourished, well developed in no acute distress HEENT: Normal NECK: No JVD; No carotid bruits LYMPHATICS: No lymphadenopathy CARDIAC: RRR, no murmurs, rubs, gallops RESPIRATORY:  Clear to auscultation without rales, wheezing or rhonchi  ABDOMEN: Soft, non-tender, non-distended MUSCULOSKELETAL:  Trace LE edema; No deformity  SKIN: Warm and dry NEUROLOGIC:  Alert and oriented x 3 PSYCHIATRIC:  Normal affect   ASSESSMENT: 1.   1. DOE (dyspnea on exertion)   2. Essential hypertension   3. Pure hypercholesterolemia   4. Edema extremities   5. Shortness of breath    PLAN:    In order of problems listed above:  1.  DOE - this mainly occurs when she goes walking out in the cold air and has been going on for a few months.  She has also noticed it when she is getting dressed.  I will get a nuclear stress test to rule out ischemia and 2D echo to assess LVF.    2.  HTN - BP is borderline controlled on exam today. She will continue on Lisinopril 20mg  daily and I am adding HCTZ for LE edema which should also help with BP.    3.  Hyperlipidemia - followed by PCP  4.  LE edema - she has trace edema on exam today and ? whether she may have some diastolic CHF.  I will start her on HCTZ 25mg  daily and hopefully this will help with SOB and edema.  I encouraged her to followup a low sodium diet.    Medication Adjustments/Labs and Tests Ordered: Current medicines are reviewed at length with the patient today.  Concerns regarding medicines are outlined above.  Orders Placed This Encounter  Procedures  . Basic metabolic panel  . MYOCARDIAL PERFUSION IMAGING  . EKG 12-Lead  . ECHOCARDIOGRAM COMPLETE   Meds ordered this encounter  Medications  . hydrochlorothiazide (HYDRODIURIL) 25 MG tablet    Sig: Take 1 tablet (25 mg total) by mouth daily.    Dispense:  90 tablet    Refill:  3    Signed, Fransico Him, MD  09/07/2017  9:22 AM    [facility]

## 2017-09-07 NOTE — Telephone Encounter (Signed)
Left message on voicemail per DPR in reference to upcoming appointment scheduled on 09/13/17  with detailed instructions given per Myocardial Perfusion Study Information Sheet for the test. LM to arrive 15 minutes early, and that it is imperative to arrive on time for appointment to keep from having the test rescheduled. If you need to cancel or reschedule your appointment, please call the office within 24 hours of your appointment. Failure to do so may result in a cancellation of your appointment, and a $50 no show fee. Phone number given for call back for any questions. Kirstie Peri

## 2017-09-13 ENCOUNTER — Other Ambulatory Visit (HOSPITAL_COMMUNITY): Payer: Medicare Other

## 2017-09-13 ENCOUNTER — Encounter (HOSPITAL_COMMUNITY): Payer: Medicare Other

## 2017-09-13 ENCOUNTER — Other Ambulatory Visit: Payer: Medicare Other

## 2017-09-14 ENCOUNTER — Ambulatory Visit (HOSPITAL_COMMUNITY): Payer: Medicare Other | Attending: Cardiology

## 2017-09-14 ENCOUNTER — Other Ambulatory Visit: Payer: Medicare Other | Admitting: *Deleted

## 2017-09-14 ENCOUNTER — Other Ambulatory Visit: Payer: Self-pay

## 2017-09-14 ENCOUNTER — Ambulatory Visit (HOSPITAL_BASED_OUTPATIENT_CLINIC_OR_DEPARTMENT_OTHER): Payer: Medicare Other

## 2017-09-14 DIAGNOSIS — R0600 Dyspnea, unspecified: Secondary | ICD-10-CM

## 2017-09-14 DIAGNOSIS — E785 Hyperlipidemia, unspecified: Secondary | ICD-10-CM | POA: Insufficient documentation

## 2017-09-14 DIAGNOSIS — R0609 Other forms of dyspnea: Secondary | ICD-10-CM | POA: Insufficient documentation

## 2017-09-14 DIAGNOSIS — I10 Essential (primary) hypertension: Secondary | ICD-10-CM | POA: Diagnosis not present

## 2017-09-14 DIAGNOSIS — R0602 Shortness of breath: Secondary | ICD-10-CM

## 2017-09-14 DIAGNOSIS — I082 Rheumatic disorders of both aortic and tricuspid valves: Secondary | ICD-10-CM | POA: Diagnosis not present

## 2017-09-14 LAB — ECHOCARDIOGRAM COMPLETE
CHL CUP TV REG PEAK VELOCITY: 281 cm/s
E/e' ratio: 11.93
EWDT: 222 ms
FS: 31 % (ref 28–44)
Height: 62.5 in
IV/PV OW: 0.85
LA ID, A-P, ES: 34 mm
LA diam end sys: 34 mm
LA vol A4C: 62 ml
LADIAMINDEX: 1.79 cm/m2
LAVOL: 73 mL
LAVOLIN: 38.4 mL/m2
LV E/e' medial: 11.93
LV PW d: 13.6 mm — AB (ref 0.6–1.1)
LVEEAVG: 11.93
LVELAT: 5.92 cm/s
LVOT area: 3.46 cm2
LVOT diameter: 21 mm
Lateral S' vel: 14.8 cm/s
MV Dec: 222
MVPKAVEL: 93.8 m/s
MVPKEVEL: 70.6 m/s
P 1/2 time: 496 ms
RV sys press: 35 mmHg
TDI e' lateral: 5.92
TDI e' medial: 5.37
TRMAXVEL: 281 cm/s
Weight: 3104 oz

## 2017-09-14 LAB — BASIC METABOLIC PANEL
BUN/Creatinine Ratio: 32 — ABNORMAL HIGH (ref 12–28)
BUN: 19 mg/dL (ref 8–27)
CO2: 22 mmol/L (ref 20–29)
Calcium: 9.7 mg/dL (ref 8.7–10.3)
Chloride: 102 mmol/L (ref 96–106)
Creatinine, Ser: 0.59 mg/dL (ref 0.57–1.00)
GFR, EST AFRICAN AMERICAN: 104 mL/min/{1.73_m2} (ref >59)
GFR, EST NON AFRICAN AMERICAN: 90 mL/min/{1.73_m2} (ref >59)
Glucose: 95 mg/dL (ref 65–99)
POTASSIUM: 4.5 mmol/L (ref 3.5–5.2)
SODIUM: 139 mmol/L (ref 134–144)

## 2017-09-14 LAB — MYOCARDIAL PERFUSION IMAGING
CHL CUP NUCLEAR SRS: 4
CSEPPHR: 99 {beats}/min
LV dias vol: 118 mL (ref 46–106)
LV sys vol: 46 mL
RATE: 0.4
Rest HR: 70 {beats}/min
SDS: 3
SSS: 7
TID: 0.92

## 2017-09-14 MED ORDER — TECHNETIUM TC 99M TETROFOSMIN IV KIT
32.8000 | PACK | Freq: Once | INTRAVENOUS | Status: AC | PRN
  Administered 2017-09-14: 32.8 via INTRAVENOUS
  Filled 2017-09-14: qty 33

## 2017-09-14 MED ORDER — PERFLUTREN LIPID MICROSPHERE
1.0000 mL | INTRAVENOUS | Status: AC | PRN
  Administered 2017-09-14: 2 mL via INTRAVENOUS

## 2017-09-14 MED ORDER — TECHNETIUM TC 99M TETROFOSMIN IV KIT
10.6000 | PACK | Freq: Once | INTRAVENOUS | Status: AC | PRN
  Administered 2017-09-14: 10.6 via INTRAVENOUS
  Filled 2017-09-14: qty 11

## 2017-09-14 MED ORDER — REGADENOSON 0.4 MG/5ML IV SOLN
0.4000 mg | Freq: Once | INTRAVENOUS | Status: AC
  Administered 2017-09-14: 0.4 mg via INTRAVENOUS

## 2017-09-15 ENCOUNTER — Other Ambulatory Visit: Payer: Self-pay | Admitting: Cardiology

## 2017-09-18 DIAGNOSIS — H25812 Combined forms of age-related cataract, left eye: Secondary | ICD-10-CM | POA: Diagnosis not present

## 2017-09-18 DIAGNOSIS — H2512 Age-related nuclear cataract, left eye: Secondary | ICD-10-CM | POA: Diagnosis not present

## 2017-09-19 ENCOUNTER — Telehealth: Payer: Self-pay | Admitting: Cardiology

## 2017-09-19 DIAGNOSIS — I351 Nonrheumatic aortic (valve) insufficiency: Secondary | ICD-10-CM

## 2017-09-19 NOTE — Telephone Encounter (Signed)
New message ° °Pt verbalized that she is returning call for RN °

## 2017-09-19 NOTE — Telephone Encounter (Signed)
Notes recorded by Teressa Senter, RN on 09/19/2017 at 5:17 PM EST Patient made aware of results. Patient stated she stopped HCTZ for a few days because she had cataract surgery. Patient states that she feels her sob is due to her asthma. She states she gets sob only with walking outside when its cold. Patient states she does not notice a difference with the LE swelling. Patient denies any LE edema. Patient aware that echo is ordered, will be scheduled in 1 year. Patient in agreement with plan and thanked me for the call.    Notes recorded by Sueanne Margarita, MD on 09/16/2017 at 8:22 PM EST Please find out if patient's SOB and LE edema have improved with HCTZ ------  Notes recorded by Sueanne Margarita, MD on 09/16/2017 at 8:21 PM EST Please let patient know that echo showed low normal LVF with EF 50-55% with mild LVH, mild to moderate AR, mild MR - repeat echo in 1 year for AR

## 2017-10-04 DIAGNOSIS — H2511 Age-related nuclear cataract, right eye: Secondary | ICD-10-CM | POA: Diagnosis not present

## 2017-10-09 DIAGNOSIS — H2511 Age-related nuclear cataract, right eye: Secondary | ICD-10-CM | POA: Diagnosis not present

## 2017-10-09 DIAGNOSIS — H25811 Combined forms of age-related cataract, right eye: Secondary | ICD-10-CM | POA: Diagnosis not present

## 2018-02-01 ENCOUNTER — Other Ambulatory Visit: Payer: Self-pay | Admitting: Cardiology

## 2018-02-21 ENCOUNTER — Ambulatory Visit: Payer: Self-pay

## 2018-02-21 NOTE — Telephone Encounter (Signed)
Patient called in with c/o "SOB" she says "this has been going on a while, but is getting worse. When I walk, I get SOB, not bad though. I don't have wheezing. I have allergies and think it's that along with my asthma. I also have some swelling to my legs that I need checking." I asked about other symptoms, she denies. According to protocol that was edited by [place], see PCP within 24 hours, appointment scheduled for tomorrow, 02/22/18 at 1520 with Tenna Delaine, PA-C, care advice given, patient verbalized understanding.  Reason for Disposition . [1] MILD difficulty breathing (e.g., minimal/no SOB at rest, SOB with walking, pulse <100) AND [2] NEW-onset or WORSE than normal  Answer Assessment - Initial Assessment Questions 1. RESPIRATORY STATUS: "Describe your breathing?" (e.g., wheezing, shortness of breath, unable to speak, severe coughing)      Shortness of breath when walking 2. ONSET: "When did this breathing problem begin?"      Off and on for a while, worse this year with allergies 3. PATTERN "Does the difficult breathing come and go, or has it been constant since it started?"      Come and goes 4. SEVERITY: "How bad is your breathing?" (e.g., mild, moderate, severe)    - MILD: No SOB at rest, mild SOB with walking, speaks normally in sentences, can lay down, no retractions, pulse < 100.    - MODERATE: SOB at rest, SOB with minimal exertion and prefers to sit, cannot lie down flat, speaks in phrases, mild retractions, audible wheezing, pulse 100-120.    - SEVERE: Very SOB at rest, speaks in single words, struggling to breathe, sitting hunched forward, retractions, pulse > 120      Mild 5. RECURRENT SYMPTOM: "Have you had difficulty breathing before?" If so, ask: "When was the last time?" and "What happened that time?"      Yes 6. CARDIAC HISTORY: "Do you have any history of heart disease?" (e.g., heart attack, angina, bypass surgery, angioplasty)      No 7. LUNG HISTORY: "Do you have any  history of lung disease?"  (e.g., pulmonary embolus, asthma, emphysema)     Asthma 8. CAUSE: "What do you think is causing the breathing problem?"      Mainly allergies 9. OTHER SYMPTOMS: "Do you have any other symptoms? (e.g., dizziness, runny nose, cough, chest pain, fever)     No 10. PREGNANCY: "Is there any chance you are pregnant?" "When was your last menstrual period?"       No 11. TRAVEL: "Have you traveled out of the country in the last month?" (e.g., travel history, exposures)      No  Protocols used: BREATHING DIFFICULTY-A-AH

## 2018-02-22 ENCOUNTER — Ambulatory Visit (INDEPENDENT_AMBULATORY_CARE_PROVIDER_SITE_OTHER): Payer: Medicare Other

## 2018-02-22 ENCOUNTER — Other Ambulatory Visit: Payer: Self-pay

## 2018-02-22 ENCOUNTER — Ambulatory Visit: Payer: Medicare Other | Admitting: Physician Assistant

## 2018-02-22 ENCOUNTER — Encounter: Payer: Self-pay | Admitting: Physician Assistant

## 2018-02-22 VITALS — BP 120/60 | HR 87 | Temp 98.8°F | Resp 20 | Ht 63.62 in | Wt 192.2 lb

## 2018-02-22 DIAGNOSIS — R0602 Shortness of breath: Secondary | ICD-10-CM

## 2018-02-22 DIAGNOSIS — R0609 Other forms of dyspnea: Secondary | ICD-10-CM

## 2018-02-22 DIAGNOSIS — J4520 Mild intermittent asthma, uncomplicated: Secondary | ICD-10-CM

## 2018-02-22 DIAGNOSIS — R0600 Dyspnea, unspecified: Secondary | ICD-10-CM

## 2018-02-22 DIAGNOSIS — R600 Localized edema: Secondary | ICD-10-CM

## 2018-02-22 DIAGNOSIS — I10 Essential (primary) hypertension: Secondary | ICD-10-CM

## 2018-02-22 LAB — POCT URINALYSIS DIP (MANUAL ENTRY)
Bilirubin, UA: NEGATIVE
GLUCOSE UA: NEGATIVE mg/dL
Ketones, POC UA: NEGATIVE mg/dL
NITRITE UA: NEGATIVE
Protein Ur, POC: NEGATIVE mg/dL
RBC UA: NEGATIVE
Spec Grav, UA: 1.025 (ref 1.010–1.025)
Urobilinogen, UA: 0.2 E.U./dL
pH, UA: 5 (ref 5.0–8.0)

## 2018-02-22 MED ORDER — RIVAROXABAN 15 MG PO TABS: 15.0000 mg | ORAL_TABLET | Freq: Two times a day (BID) | ORAL | 0 refills | Status: DC

## 2018-02-22 NOTE — Patient Instructions (Addendum)
Start xarelto tonight, take one dose tonight and one tomorrow morning. Take with food. Do not take aspirin or ibuprofen. Elevate feet at night. Start doing nasal saline rinses. If any of your symptoms worsen or you develop new chest pain, racing heart, leg pain, or other concerning symptoms, seek care immediately. Thank you for letting me participate in your health and well being.  Edema Edema is when you have too much fluid in your body or under your skin. Edema may make your legs, feet, and ankles swell up. Swelling is also common in looser tissues, like around your eyes. This is a common condition. It gets more common as you get older. There are many possible causes of edema. Eating too much salt (sodium) and being on your feet or sitting for a long time can cause edema in your legs, feet, and ankles. Hot weather may make edema worse. Edema is usually painless. Your skin may look swollen or shiny. Follow these instructions at home:  Keep the swollen body part raised (elevated) above the level of your heart when you are sitting or lying down.  Do not sit still or stand for a long time.  Do not wear tight clothes. Do not wear garters on your upper legs.  Exercise your legs. This can help the swelling go down.  Wear elastic bandages or support stockings as told by your doctor.  Eat a low-salt (low-sodium) diet to reduce fluid as told by your doctor.  Depending on the cause of your swelling, you may need to limit how much fluid you drink (fluid restriction).  Take over-the-counter and prescription medicines only as told by your doctor. Contact a doctor if:  Treatment is not working.  You have heart, liver, or kidney disease and have symptoms of edema.  You have sudden and unexplained weight gain. Get help right away if:  You have shortness of breath or chest pain.  You cannot breathe when you lie down.  You have pain, redness, or warmth in the swollen areas.  You have heart,  liver, or kidney disease and get edema all of a sudden.  You have a fever and your symptoms get worse all of a sudden. Summary  Edema is when you have too much fluid in your body or under your skin.  Edema may make your legs, feet, and ankles swell up. Swelling is also common in looser tissues, like around your eyes.  Raise (elevate) the swollen body part above the level of your heart when you are sitting or lying down.  Follow your doctor's instructions about diet and how much fluid you can drink (fluid restriction). This information is not intended to replace advice given to you by your health care provider. Make sure you discuss any questions you have with your health care provider. Document Released: 02/15/2008 Document Revised: 09/16/2016 Document Reviewed: 09/16/2016 Elsevier Interactive Patient Education  2017 [employer].    IF you received an x-ray today, you will receive an invoice from [facility]. Please contact [facility] at [phone] with questions or concerns regarding your invoice.   IF you received labwork today, you will receive an invoice from [facility]. Please contact [facility] at [phone] with questions or concerns regarding your invoice.   Our billing staff will not be able to assist you with questions regarding bills from these companies.  You will be contacted with the lab results as soon as they are available. The fastest way to get your results is to activate your My  Chart account. Instructions are located on the last page of this paperwork. If you have not heard from [place] regarding the results in 2 weeks, please contact this office.

## 2018-02-22 NOTE — Progress Notes (Signed)
FRANCES AMBROSINO  MRN: [id number] DOB: [birth year]  Subjective:  Kerry [name] is a 77 y.o. female with PMH of HTN, HLD, syncope, asthma, and allergies seen in office today for a chief complaint of worsening BLE edema x 2 days. Notes she always has baseline edema in lower extremities, but this is worse.  Has associated DOE, which is baseline for her, but notes it is typically worse in cold and hot weather. Feels like there is more phlegm in her throat and that makes her breathing worse. Did have one episode of left calf pain yesterday, which resolved. Denies redness and warmth.  Does not feel short of breath at rest. Edema worsens throughout the day. It is improved with elevation and first thing in the morning. 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  Denies any chest pain or pressure,  PND, orthopnea, dizziness, palpitations or syncope. No recent surgery/travel/immobilization, hemoptysis, prior DVT/PE, or hormone use. She has been taking Allegra for allergies.  Not using any nasal spray.  Today, she has taken her metoprolol for blood pressure.  She has not taken hydrochlorothiazide or lisinopril.  PMH of endometrial cancer, been in remission since 1987. PSH of ankle surgery in left ankle years ago and right lower tib/fib surgery years ago. Denies new medication use. Denies smoking.  Did not take aspirin today, takes it when she remembers.    Review of Systems  Constitutional: Negative for chills, diaphoresis and fever.  HENT: Positive for congestion and sneezing. Negative for sinus pain.   Eyes: Negative for visual disturbance.  Respiratory: Negative for cough and wheezing.   Gastrointestinal: Negative for abdominal pain, nausea and vomiting.  Endocrine: Negative for polydipsia, polyphagia and polyuria.  Genitourinary: Negative for decreased urine volume, dysuria, frequency and urgency.  Skin: Negative for rash.  Neurological: Negative for dizziness and  light-headedness.  Psychiatric/Behavioral: Negative for confusion.    Patient Active Problem List   Diagnosis Date Noted  . Hyperlipidemia   . Right thyroid nodule 02/07/2013  . Right carotid bruit 07/16/2012  . Ocular migraine 11/04/2011  . Hypercholesterolemia 06/01/2011  . HTN (hypertension)   . History of syncope   . Asthma     Current Outpatient Medications on File Prior to Visit  Medication Sig Dispense Refill  . Ascorbic Acid (VITAMIN C PO) Take 1 tablet by mouth daily.    Marland Kitchen aspirin 81 MG chewable tablet Chew 81 mg by mouth daily.    . Cholecalciferol (VITAMIN D3) 2000 units capsule Take 2,000 Units by mouth daily.    . Coenzyme Q10 (CO Q 10 PO) Take 1 capsule by mouth daily.     . fexofenadine (ALLEGRA) 180 MG tablet Take 180 mg by mouth daily.    . hydrochlorothiazide (HYDRODIURIL) 25 MG tablet Take 1 tablet (25 mg total) by mouth daily. 90 tablet 3  . ibuprofen (ADVIL,MOTRIN) 200 MG tablet Take 200 mg by mouth daily.     Marland Kitchen lisinopril (PRINIVIL,ZESTRIL) 20 MG tablet TAKE 1 TABLET BY MOUTH EVERY DAY 90 tablet 1  . metoprolol succinate (TOPROL-XL) 25 MG 24 hr tablet TAKE 1 TABLET (25 MG TOTAL) BY MOUTH 2 (TWO) TIMES DAILY. 180 tablet 3  . Multiple Vitamin (MULTIVITAMIN) tablet Take 1 tablet by mouth daily.       No current facility-administered medications on file prior to visit.     Allergies  Allergen Reactions  . Lovastatin Other (See Comments)    myalgias  .  Sudafed [Pseudoephedrine Hcl] Other (See Comments)    Hallucinations    Social History   Socioeconomic History  . Marital status: Widowed    Spouse name: Not on file  . Number of children: 2  . Years of education: Not on file  . Highest education level: Not on file  Occupational History  . Not on file  Social Needs  . Financial resource strain: Not on file  . Food insecurity:    Worry: Not on file    Inability: Not on file  . Transportation needs:    Medical: Not on file    Non-medical: Not on  file  Tobacco Use  . Smoking status: Never Smoker  . Smokeless tobacco: Never Used  Substance and Sexual Activity  . Alcohol use: No  . Drug use: No  . Sexual activity: Not Currently  Lifestyle  . Physical activity:    Days per week: Not on file    Minutes per session: Not on file  . Stress: Not on file  Relationships  . Social connections:    Talks on phone: Not on file    Gets together: Not on file    Attends religious service: Not on file    Active member of club or organization: Not on file    Attends meetings of clubs or organizations: Not on file    Relationship status: Not on file  . Intimate partner violence:    Fear of current or ex partner: Not on file    Emotionally abused: Not on file    Physically abused: Not on file    Forced sexual activity: Not on file  Other Topics Concern  . Not on file  Social History Narrative  . Not on file   Family History  Problem Relation Age of Onset  . Mitral valve prolapse Daughter   . Heart disease Mother   . Kidney failure Mother   . Diabetes Mother   . Cancer Mother        uterine  . Hyperlipidemia Mother   . Hypertension Mother   . Dementia Father   . Diabetes Sister   . Diabetes Paternal Grandmother       Objective:  BP 120/60 (BP Location: Left Arm, Patient Position: Sitting, Cuff Size: Large)   Pulse 87   Temp 98.8 F (37.1 C) (Oral)   Resp 20   Ht 5' 3.62" (1.616 m)   Wt 192 lb 3.2 oz (87.2 kg)   SpO2 95%   BMI 33.38 kg/m   Physical Exam  Constitutional: She is oriented to person, place, and time. She appears well-developed and well-nourished.  Non-toxic appearance. She does not appear ill. No distress.  HENT:  Head: Normocephalic and atraumatic.  Nose: Mucosal edema and rhinorrhea present. Right sinus exhibits no maxillary sinus tenderness and no frontal sinus tenderness. Left sinus exhibits no maxillary sinus tenderness and no frontal sinus tenderness.  Eyes: Conjunctivae are normal.  Neck: Normal  range of motion.  Cardiovascular: Normal rate, regular rhythm, normal heart sounds and intact distal pulses.  No JVD noted.   Pulmonary/Chest: Effort normal and breath sounds normal. No accessory muscle usage. No respiratory distress. She has no decreased breath sounds. She has no wheezes. She has no rhonchi. She has no rales.  Musculoskeletal:       Right lower leg: She exhibits edema (2+ to knee).       Left lower leg: She exhibits edema (2+ to knee).  Lower extremity measurements:  Right  below knee: 43.5cm Left below knee: 43.5cm  Right calf: 40 cm Left calf: 39 cm  Right above ankle: 27cm Left above ankle: 29cm   Varicose veins noted in BLE.  Left lower extremity with mild pinkish discoloration, no overlying warmth noted.   Pain with palpation of left lower calf.   Negative Homan's sign b/l    Neurological: She is alert and oriented to person, place, and time.  Skin: Skin is warm and dry.  Psychiatric: She has a normal mood and affect.  Vitals reviewed.   EKG shows NSR with rate of 72 bpm. PR and QRS intervals within normal limits. No acute changes noted from prior EKG of 08/2017. Findings presented and discussed with Dr. Pamella Pert.   No results found for this or any previous visit (from the past 24 hour(s)).  Dg Chest 2 View  Result Date: 02/22/2018 CLINICAL DATA:  Shortness of breath for 2 days EXAM: CHEST - 2 VIEW COMPARISON:  08/28/2018 FINDINGS: Cardiac shadow is stable. The lungs are well aerated bilaterally. No focal infiltrate or sizable effusion is seen. Degenerative changes of thoracic spine are noted. IMPRESSION: No active cardiopulmonary disease. Electronically Signed   By: Inez Catalina M.D.   On: 02/22/2018 17:02    Wells Criteria for Deep Vein Thrombosis  Cancer (active): No (0 points) Bedridden recently more than 3 days or major surgery within 4 weeks: No (0 points) Swelling of calf more than 3cm compared to other leg: No (0 points) Collateral  (nonvaricose) superficial veins present: No (0 points) Entire leg swollen: No (0 points) Localized tenderness along deep venous system: Yes (1 point) Pitting edema, greater in symptomatic leg: No (0 points) Paralysis, paresis, or recent plaster immobilization of lower extremity: No (0 points) Previous documented DVT: No (0 points) Alternative diagnosis to DVT as likely or more likely: No (0 points)  1 point. Low risk group for DVT. "Unlikely" according to Wells DVT Studies.   HAS BLED score of 1  (+for >65 yo)  Assessment and Plan :  1. Dyspnea on exertion - Brain natriuretic peptide - EKG 12-Lead - DG Chest 2 View; Future - POCT urinalysis dipstick - Check Pulse Oximetry while ambulating 2. Lower extremity edema Unclear etiology at this time. DDx includes dependent edema vs. New onset CHF vs. DVT. Labs pending. Patient is overall well-appearing, no distress. Vitals stable.   No proteinuria. Unlikely new onset CHF, as lungs CTAB and CXR with no cardiomegaly or fluid, no JVD noted.  Pt does endorse DOE, however, she also has asthma and uncontrolled seasonal allergies, which could be contributing to her sx. No SOB at rest or orthopnea noted. Rec nasal saline rinses to see if this helps with the sensation of having phlegm in the throat. Patient has bilateral lower extremity swelling of varying degrees in both legs. +pain along deep venous system in left leg. Recommended stat ultrasound.  Due to time of day, we cannot get a STAT image ordered. Discussed options with pt-> treat empricially until STAT image can be ordered tomorrow vs go to ED. Pt would like to tx empirically and have image tomorrow, with strict ED precautions.   Discussed potential risk and side effects of Xarelto with patient. HAS BLED score of 1.  She agrees to treatment. - CBC with Differential/Platelet - CMP14+EGFR - TSH - Brain natriuretic peptide - VAS [place] LOWER EXTREMITY VENOUS (DVT); Future - Rivaroxaban (XARELTO) 15 MG  TABS tablet; Take 1 tablet (15 mg total) by mouth 2 (two) times  daily with a meal.  Dispense: 2 tablet; Refill: 0  3. Mild intermittent asthma, unspecified whether complicated  4. Essential hypertension  Side effects, risks, benefits, and alternatives of the medications and treatment plan prescribed today were discussed, and patient expressed understanding of the instructions given. No barriers to understanding were identified. Red flags discussed in detail. Pt expressed understanding regarding what to do in case of emergency/urgent symptoms.  A total of 40 minutes was spent in the room with the patient, greater than 50% of which was in counseling/coordination of care regarding lower extremity edema and DOE.   Tenna Delaine PA-C  [facility] 02/22/2018 5:13 PM

## 2018-02-23 ENCOUNTER — Telehealth: Payer: Self-pay | Admitting: Physician Assistant

## 2018-02-23 ENCOUNTER — Other Ambulatory Visit: Payer: Self-pay | Admitting: Physician Assistant

## 2018-02-23 ENCOUNTER — Encounter: Payer: Self-pay | Admitting: Physician Assistant

## 2018-02-23 DIAGNOSIS — R600 Localized edema: Secondary | ICD-10-CM | POA: Diagnosis not present

## 2018-02-23 LAB — CBC WITH DIFFERENTIAL/PLATELET
BASOS: 0 %
Basophils Absolute: 0 10*3/uL (ref 0.0–0.2)
EOS (ABSOLUTE): 0.4 10*3/uL (ref 0.0–0.4)
EOS: 5 %
HEMOGLOBIN: 12.5 g/dL (ref 11.1–15.9)
Hematocrit: 38.1 % (ref 34.0–46.6)
Immature Grans (Abs): 0 10*3/uL (ref 0.0–0.1)
Immature Granulocytes: 0 %
LYMPHS ABS: 2 10*3/uL (ref 0.7–3.1)
Lymphs: 23 %
MCH: 29.9 pg (ref 26.6–33.0)
MCHC: 32.8 g/dL (ref 31.5–35.7)
MCV: 91 fL (ref 79–97)
MONOCYTES: 10 %
MONOS ABS: 0.9 10*3/uL (ref 0.1–0.9)
NEUTROS ABS: 5.5 10*3/uL (ref 1.4–7.0)
Neutrophils: 62 %
Platelets: 260 10*3/uL (ref 150–450)
RBC: 4.18 x10E6/uL (ref 3.77–5.28)
RDW: 12.5 % (ref 12.3–15.4)
WBC: 8.9 10*3/uL (ref 3.4–10.8)

## 2018-02-23 LAB — BRAIN NATRIURETIC PEPTIDE: BNP: 67.1 pg/mL (ref 0.0–100.0)

## 2018-02-23 LAB — CMP14+EGFR
ALBUMIN: 4.1 g/dL (ref 3.5–4.8)
ALK PHOS: 95 IU/L (ref 39–117)
ALT: 17 IU/L (ref 0–32)
AST: 23 IU/L (ref 0–40)
Albumin/Globulin Ratio: 1.5 (ref 1.2–2.2)
BILIRUBIN TOTAL: 0.3 mg/dL (ref 0.0–1.2)
BUN / CREAT RATIO: 22 (ref 12–28)
BUN: 21 mg/dL (ref 8–27)
CO2: 23 mmol/L (ref 20–29)
CREATININE: 0.96 mg/dL (ref 0.57–1.00)
Calcium: 9.7 mg/dL (ref 8.7–10.3)
Chloride: 104 mmol/L (ref 96–106)
GFR calc Af Amer: 66 mL/min/{1.73_m2} (ref >59)
GFR calc non Af Amer: 58 mL/min/{1.73_m2} — ABNORMAL LOW (ref >59)
Globulin, Total: 2.8 g/dL (ref 1.5–4.5)
Glucose: 125 mg/dL — ABNORMAL HIGH (ref 65–99)
Potassium: 4.2 mmol/L (ref 3.5–5.2)
SODIUM: 140 mmol/L (ref 134–144)
Total Protein: 6.9 g/dL (ref 6.0–8.5)

## 2018-02-23 LAB — TSH: TSH: 2.33 u[IU]/mL (ref 0.450–4.500)

## 2018-02-23 NOTE — Telephone Encounter (Signed)
xarelto refill Last Refill:02/22/18 # 2 No RF Last OV: 02/23/18 PCP: Tenna Delaine PA [place]

## 2018-02-23 NOTE — Telephone Encounter (Signed)
Pt is scheduled to have STAT DVT U/S today 02/23/18 at 2:30pm with [facility] [place]. I tried [facility] on [address], and [facility]. I also tried [facility] but no one had openings for pt after 2pm. I called [facility] and they were able to get pt in at 2:30 with an arrival of 2:15. I spoke with pt at work and advised her of this. Call report number given is Clinical TL Desk.

## 2018-02-24 ENCOUNTER — Other Ambulatory Visit: Payer: Self-pay | Admitting: Physician Assistant

## 2018-02-24 ENCOUNTER — Encounter: Payer: Self-pay | Admitting: Physician Assistant

## 2018-02-25 ENCOUNTER — Other Ambulatory Visit: Payer: Self-pay | Admitting: Physician Assistant

## 2018-02-25 DIAGNOSIS — R600 Localized edema: Secondary | ICD-10-CM

## 2018-02-26 ENCOUNTER — Other Ambulatory Visit: Payer: Self-pay | Admitting: Physician Assistant

## 2018-02-26 DIAGNOSIS — R600 Localized edema: Secondary | ICD-10-CM

## 2018-02-26 NOTE — Telephone Encounter (Signed)
LOV 02/22/18 Kerry [name] Xarelto was a short term dose - pharmacy asking for refill.

## 2018-02-26 NOTE — Telephone Encounter (Signed)
Patient is requesting a refill of the following medications: Requested Prescriptions   Pending Prescriptions Disp Refills  . XARELTO 15 MG TABS tablet [Pharmacy Med Name: XARELTO 15MG  TABLETS] 2 tablet 0    Sig: TAKE 1 TABLET BY MOUTH TWICE DAILY WITH FOOD    Date of patient request: 02/26/2018 Last office visit: 02/22/2018 Date of last refill: 02/22/2018 Last refill amount: 2 tablet Follow up time period per chart:

## 2018-02-26 NOTE — Telephone Encounter (Signed)
Please advise/refill : Kerry [name] 02/22/18  Pharmacy asking for refill

## 2018-03-20 DIAGNOSIS — Z6834 Body mass index (BMI) 34.0-34.9, adult: Secondary | ICD-10-CM | POA: Diagnosis not present

## 2018-03-20 DIAGNOSIS — M8588 Other specified disorders of bone density and structure, other site: Secondary | ICD-10-CM | POA: Diagnosis not present

## 2018-03-20 DIAGNOSIS — Z1231 Encounter for screening mammogram for malignant neoplasm of breast: Secondary | ICD-10-CM | POA: Diagnosis not present

## 2018-03-20 DIAGNOSIS — Z01419 Encounter for gynecological examination (general) (routine) without abnormal findings: Secondary | ICD-10-CM | POA: Diagnosis not present

## 2018-04-24 DIAGNOSIS — Z961 Presence of intraocular lens: Secondary | ICD-10-CM | POA: Diagnosis not present

## 2018-06-27 ENCOUNTER — Encounter: Payer: Self-pay | Admitting: Cardiology

## 2018-07-25 DIAGNOSIS — Z23 Encounter for immunization: Secondary | ICD-10-CM | POA: Diagnosis not present

## 2018-07-25 DIAGNOSIS — Z86006 Personal history of melanoma in-situ: Secondary | ICD-10-CM | POA: Diagnosis not present

## 2018-07-25 DIAGNOSIS — D0439 Carcinoma in situ of skin of other parts of face: Secondary | ICD-10-CM | POA: Diagnosis not present

## 2018-07-25 DIAGNOSIS — D485 Neoplasm of uncertain behavior of skin: Secondary | ICD-10-CM | POA: Diagnosis not present

## 2018-07-25 DIAGNOSIS — L821 Other seborrheic keratosis: Secondary | ICD-10-CM | POA: Diagnosis not present

## 2018-08-03 ENCOUNTER — Other Ambulatory Visit: Payer: Self-pay | Admitting: Cardiology

## 2018-08-10 ENCOUNTER — Other Ambulatory Visit: Payer: Self-pay | Admitting: Cardiology

## 2018-08-14 NOTE — Telephone Encounter (Signed)
Outpatient Medication Detail    Disp Refills Start End   lisinopril (PRINIVIL,ZESTRIL) 20 MG tablet 30 tablet 0 08/03/2018    Sig: TAKE 1 TABLET BY MOUTH EVERY DAY   Sent to pharmacy as: lisinopril (PRINIVIL,ZESTRIL) 20 MG tablet   E-Prescribing Status: Receipt confirmed by pharmacy (08/03/2018 11:41 AM EST)   Pharmacy   [id number] - [place], [place]

## 2018-08-17 ENCOUNTER — Encounter: Payer: Self-pay | Admitting: Cardiology

## 2018-08-17 ENCOUNTER — Ambulatory Visit: Payer: Medicare Other | Admitting: Cardiology

## 2018-08-17 VITALS — BP 142/70 | HR 72 | Ht 63.0 in | Wt 190.4 lb

## 2018-08-17 DIAGNOSIS — Z87898 Personal history of other specified conditions: Secondary | ICD-10-CM

## 2018-08-17 DIAGNOSIS — I10 Essential (primary) hypertension: Secondary | ICD-10-CM

## 2018-08-17 DIAGNOSIS — I351 Nonrheumatic aortic (valve) insufficiency: Secondary | ICD-10-CM

## 2018-08-17 DIAGNOSIS — E7800 Pure hypercholesterolemia, unspecified: Secondary | ICD-10-CM | POA: Diagnosis not present

## 2018-08-17 NOTE — Progress Notes (Addendum)
Cardiology Office Note:    Date:  08/17/2018   ID:  Kerry [name], DOB Jan 01, 1941, MRN [id number]  PCP:  Leonie Douglas, PA-C  Cardiologist:  No primary care provider on file.    Referring MD: Leonie Douglas, PA*   Chief Complaint  Patient presents with  . Hypertension  . Hyperlipidemia    History of Present Illness:    [name] is a 77 y.o. female with a hx of HTN, syncope and hyperlipidemia (statin intolerant).  She is here today for followup and is doing well.  She denies any chest pain or pressure, SOB, DOE, PND, orthopnea, LE edema, dizziness, palpitations or syncope. She is compliant with her meds and is tolerating meds with no SE.    Past Medical History:  Diagnosis Date  . Allergy   . Asthma    AS A CHILD  . History of syncope   . HTN (hypertension)   . Hyperlipidemia     Past Surgical History:  Procedure Laterality Date  . ABDOMINAL HYSTERECTOMY    . APPENDECTOMY    . CHOLECYSTECTOMY      Current Medications: Current Meds  Medication Sig  . Ascorbic Acid (VITAMIN C PO) Take 1 tablet by mouth daily.  Marland Kitchen aspirin 81 MG chewable tablet Chew 81 mg by mouth daily.  . Cholecalciferol (VITAMIN D3) 2000 units capsule Take 2,000 Units by mouth daily.  . Coenzyme Q10 (CO Q 10 PO) Take 1 capsule by mouth daily.   . fexofenadine (ALLEGRA) 180 MG tablet Take 180 mg by mouth daily.  . hydrochlorothiazide (HYDRODIURIL) 25 MG tablet Take 1 tablet (25 mg total) by mouth daily.  Marland Kitchen ibuprofen (ADVIL,MOTRIN) 200 MG tablet Take 200 mg by mouth daily.   Marland Kitchen lisinopril (PRINIVIL,ZESTRIL) 20 MG tablet TAKE 1 TABLET BY MOUTH EVERY DAY  . metoprolol succinate (TOPROL-XL) 25 MG 24 hr tablet TAKE 1 TABLET (25 MG TOTAL) BY MOUTH 2 (TWO) TIMES DAILY.  . Multiple Vitamin (MULTIVITAMIN) tablet Take 1 tablet by mouth daily.    . Rivaroxaban (XARELTO) 15 MG TABS tablet Take 1 tablet (15 mg total) by mouth 2 (two) times daily with a meal.     Allergies:   Lovastatin and  Sudafed [pseudoephedrine hcl]   Social History   Socioeconomic History  . Marital status: Widowed    Spouse name: Not on file  . Number of children: 2  . Years of education: Not on file  . Highest education level: Not on file  Occupational History  . Not on file  Social Needs  . Financial resource strain: Not on file  . Food insecurity:    Worry: Not on file    Inability: Not on file  . Transportation needs:    Medical: Not on file    Non-medical: Not on file  Tobacco Use  . Smoking status: Never Smoker  . Smokeless tobacco: Never Used  Substance and Sexual Activity  . Alcohol use: No  . Drug use: No  . Sexual activity: Not Currently  Lifestyle  . Physical activity:    Days per week: Not on file    Minutes per session: Not on file  . Stress: Not on file  Relationships  . Social connections:    Talks on phone: Not on file    Gets together: Not on file    Attends religious service: Not on file    Active member of club or organization: Not on file    Attends meetings of  clubs or organizations: Not on file    Relationship status: Not on file  Other Topics Concern  . Not on file  Social History Narrative  . Not on file     Family History: The patient's family history includes Cancer in her mother; Dementia in her father; Diabetes in her mother, paternal grandmother, and sister; Heart disease in her mother; Hyperlipidemia in her mother; Hypertension in her mother; Kidney failure in her mother; Mitral valve prolapse in her daughter.  ROS:   Please see the history of present illness.    ROS  All other systems reviewed and negative.   EKGs/Labs/Other Studies Reviewed:    The following studies were reviewed today: none  EKG:  EKG is not ordered today.    Recent Labs: 02/22/2018: ALT 17; BNP 67.1; BUN 21; Creatinine, Ser 0.96; Hemoglobin 12.5; Platelets 260; Potassium 4.2; Sodium 140; TSH 2.330   Recent Lipid Panel    Component Value Date/Time   CHOL 201 (H)  04/14/2017 1041   TRIG 104 04/14/2017 1041   HDL 61 04/14/2017 1041   CHOLHDL 3.3 04/14/2017 1041   CHOLHDL 2.9 08/18/2016 0849   VLDL 14 08/18/2016 0849   LDLCALC 119 (H) 04/14/2017 1041   LDLDIRECT 140.3 06/01/2011 1019    Physical Exam:    VS:  BP (!) 142/70   Pulse 72   Ht 5\' 3"  (1.6 m)   Wt 190 lb 6.4 oz (86.4 kg)   SpO2 99%   BMI 33.73 kg/m     Wt Readings from Last 3 Encounters:  08/17/18 190 lb 6.4 oz (86.4 kg)  02/22/18 192 lb 3.2 oz (87.2 kg)  09/14/17 194 lb (88 kg)     GEN:  Well nourished, well developed in no acute distress HEENT: Normal NECK: No JVD; No carotid bruits LYMPHATICS: No lymphadenopathy CARDIAC: RRR, no murmurs, rubs, gallops RESPIRATORY:  Clear to auscultation without rales, wheezing or rhonchi  ABDOMEN: Soft, non-tender, non-distended MUSCULOSKELETAL:  No edema; No deformity  SKIN: Warm and dry NEUROLOGIC:  Alert and oriented x 3 PSYCHIATRIC:  Normal affect   ASSESSMENT:    1. Essential hypertension   2. History of syncope   3. Pure hypercholesterolemia   4. Nonrheumatic aortic valve insufficiency    PLAN:    In order of problems listed above:  1.  HTN -her blood pressure is well controlled on exam today.  She will continue on lisinopril 20 mg daily and Toprol-XL 25 mg daily.  Her creatinine is stable at 0.96 with potassium 4.2 on 02/22/2018.  2. Syncope - she has not had any further episodes of syncope.  3.  Hyperlipidemia -she is statin intolerant.  4.  Aortic insufficiency - echo 09/14/2017 showed low normal LV function with EF 50 to 55% with mild to moderate aortic insufficiency.  She has a repeat echo scheduled for next month.   Medication Adjustments/Labs and Tests Ordered: Current medicines are reviewed at length with the patient today.  Concerns regarding medicines are outlined above.  No orders of the defined types were placed in this encounter.  No orders of the defined types were placed in this  encounter.   Signed, Fransico Him, MD  08/17/2018 8:19 AM    [place]

## 2018-08-17 NOTE — Patient Instructions (Signed)

## 2018-09-01 ENCOUNTER — Other Ambulatory Visit: Payer: Self-pay | Admitting: Cardiology

## 2018-09-11 ENCOUNTER — Other Ambulatory Visit: Payer: Self-pay | Admitting: Cardiology

## 2018-09-14 ENCOUNTER — Other Ambulatory Visit: Payer: Self-pay | Admitting: Cardiology

## 2018-09-18 ENCOUNTER — Ambulatory Visit (HOSPITAL_COMMUNITY): Payer: Medicare Other | Attending: Cardiology

## 2018-09-18 ENCOUNTER — Other Ambulatory Visit: Payer: Self-pay

## 2018-09-18 DIAGNOSIS — I351 Nonrheumatic aortic (valve) insufficiency: Secondary | ICD-10-CM

## 2018-09-19 ENCOUNTER — Telehealth: Payer: Self-pay

## 2018-09-19 DIAGNOSIS — I351 Nonrheumatic aortic (valve) insufficiency: Secondary | ICD-10-CM

## 2018-09-19 NOTE — Telephone Encounter (Signed)
The patient has been notified of the result and verbalized understanding.  All questions (if any) were answered. Repeat echo ordered 1 year around 09/20/19.  Sarina Ill, RN 09/19/2018 4:25 PM

## 2018-09-19 NOTE — Telephone Encounter (Signed)
-----   Message from Sueanne Margarita, MD sent at 09/18/2018  6:25 PM EST ----- Please let patient know that echo showed mildly thickened heart muscle with normal LVF and moderately leaky AV and PV  and mildly leaky MV - stable echo - repeat in 1 year

## 2018-09-26 DIAGNOSIS — D0439 Carcinoma in situ of skin of other parts of face: Secondary | ICD-10-CM | POA: Diagnosis not present

## 2018-10-16 ENCOUNTER — Telehealth: Payer: Self-pay | Admitting: *Deleted

## 2018-10-17 NOTE — Telephone Encounter (Signed)
sign

## 2019-01-11 ENCOUNTER — Other Ambulatory Visit: Payer: Self-pay

## 2019-01-11 DIAGNOSIS — I10 Essential (primary) hypertension: Secondary | ICD-10-CM

## 2019-01-11 DIAGNOSIS — Z87898 Personal history of other specified conditions: Secondary | ICD-10-CM

## 2019-01-21 ENCOUNTER — Ambulatory Visit (INDEPENDENT_AMBULATORY_CARE_PROVIDER_SITE_OTHER): Payer: Medicare Other | Admitting: Family Medicine

## 2019-01-21 ENCOUNTER — Other Ambulatory Visit: Payer: Self-pay

## 2019-01-21 DIAGNOSIS — J45990 Exercise induced bronchospasm: Secondary | ICD-10-CM | POA: Diagnosis not present

## 2019-01-21 DIAGNOSIS — I10 Essential (primary) hypertension: Secondary | ICD-10-CM | POA: Diagnosis not present

## 2019-01-21 DIAGNOSIS — Z23 Encounter for immunization: Secondary | ICD-10-CM

## 2019-01-21 DIAGNOSIS — Z0000 Encounter for general adult medical examination without abnormal findings: Secondary | ICD-10-CM

## 2019-01-21 DIAGNOSIS — Z0001 Encounter for general adult medical examination with abnormal findings: Secondary | ICD-10-CM

## 2019-01-21 MED ORDER — MONTELUKAST SODIUM 10 MG PO TABS: 10.0000 mg | ORAL_TABLET | Freq: Every day | ORAL | 1 refills | Status: DC

## 2019-01-21 MED ORDER — ALBUTEROL SULFATE HFA 108 (90 BASE) MCG/ACT IN AERS: 2.0000 | INHALATION_SPRAY | Freq: Four times a day (QID) | RESPIRATORY_TRACT | 0 refills | Status: DC | PRN

## 2019-01-21 NOTE — Progress Notes (Signed)
Telemedicine Encounter- SOAP NOTE Established Patient  This telephone encounter was conducted with the patient's (or proxy's) verbal consent via audio telecommunications: yes/no: Yes Patient was instructed to have this encounter in a suitably private space; and to only have persons present to whom they give permission to participate. In addition, patient identity was confirmed by use of name plus two identifiers (DOB and address).  I discussed the limitations, risks, security and privacy concerns of performing an evaluation and management service by telephone and the availability of in person appointments. I also discussed with the patient that there may be a patient responsible charge related to this service. The patient expressed understanding and agreed to proceed.  I spent a total of TIME; 0 MIN TO 60 MIN: 30 minutes talking with the patient or their proxy.   QUICK REFERENCE INFORMATION: The ABCs of Providing the Annual Wellness Visit  CMS.[place]  [employer] Wellness Visit  Subjective:   Kerry [name] is a 78 y.o. Female who presents for an Annual Wellness Visit.  Childhood asthma  She has not had any episodes of asthma attack She used to take singulair and use an inhaler Now she is having worsening DOE She is taking antihistamines She states that when she exerts herself she has to slow down because she couldn't breath   She was evaluated in 2019 for SOB and it was not cardiac in origin  CLINICAL DATA:  Shortness of breath for 2 days  EXAM: CHEST - 2 VIEW  COMPARISON:  08/28/2018  FINDINGS: Cardiac shadow is stable. The lungs are well aerated bilaterally. No focal infiltrate or sizable effusion is seen. Degenerative changes of thoracic spine are noted.  IMPRESSION: No active cardiopulmonary disease.   Electronically Signed   By: Inez Catalina M.D.   On: 02/22/2018 17:02  Patient Active Problem List   Diagnosis Date Noted  . Aortic  insufficiency 08/17/2018  . Hyperlipidemia   . Right thyroid nodule 02/07/2013  . Right carotid bruit 07/16/2012  . Ocular migraine 11/04/2011  . Hypercholesterolemia 06/01/2011  . HTN (hypertension)   . History of syncope   . Asthma     Past Medical History:  Diagnosis Date  . Allergy   . Asthma    AS A CHILD  . History of syncope   . HTN (hypertension)   . Hyperlipidemia      Past Surgical History:  Procedure Laterality Date  . ABDOMINAL HYSTERECTOMY    . APPENDECTOMY    . CHOLECYSTECTOMY       Outpatient Medications Prior to Visit  Medication Sig Dispense Refill  . acetaminophen (TYLENOL) 500 MG tablet Take 500 mg by mouth every 6 (six) hours as needed.    . Ascorbic Acid (VITAMIN C PO) Take 1 tablet by mouth daily.    Marland Kitchen aspirin 81 MG chewable tablet Chew 81 mg by mouth daily.    . Cholecalciferol (VITAMIN D3) 2000 units capsule Take 2,000 Units by mouth daily.    . Coenzyme Q10 (CO Q 10 PO) Take 1 capsule by mouth daily.     . fexofenadine (ALLEGRA) 180 MG tablet Take 180 mg by mouth daily.    . hydrochlorothiazide (HYDRODIURIL) 25 MG tablet TAKE 1 TABLET(25 MG) BY MOUTH DAILY 90 tablet 1  . lisinopril (PRINIVIL,ZESTRIL) 20 MG tablet TAKE 1 TABLET BY MOUTH EVERY DAY 90 tablet 3  . metoprolol succinate (TOPROL-XL) 25 MG 24 hr tablet TAKE 1 TABLET BY MOUTH TWICE DAILY 180 tablet 3  .  Multiple Vitamin (MULTIVITAMIN) tablet Take 1 tablet by mouth daily.      Marland Kitchen ibuprofen (ADVIL,MOTRIN) 200 MG tablet Take 200 mg by mouth daily.     . Rivaroxaban (XARELTO) 15 MG TABS tablet Take 1 tablet (15 mg total) by mouth 2 (two) times daily with a meal. (Patient not taking: Reported on 01/21/2019) 2 tablet 0   No facility-administered medications prior to visit.     Allergies  Allergen Reactions  . Lovastatin Other (See Comments)    myalgias  . Sudafed [Pseudoephedrine Hcl] Other (See Comments)    Hallucinations      Family History  Problem Relation Age of Onset  . Mitral  valve prolapse Daughter   . Heart disease Mother   . Kidney failure Mother   . Diabetes Mother   . Cancer Mother        uterine  . Hyperlipidemia Mother   . Hypertension Mother   . Dementia Father   . Diabetes Sister   . Diabetes Paternal Grandmother      Social History   Socioeconomic History  . Marital status: Widowed    Spouse name: Not on file  . Number of children: 2  . Years of education: Not on file  . Highest education level: Not on file  Occupational History  . Not on file  Social Needs  . Financial resource strain: Not on file  . Food insecurity:    Worry: Not on file    Inability: Not on file  . Transportation needs:    Medical: Not on file    Non-medical: Not on file  Tobacco Use  . Smoking status: Never Smoker  . Smokeless tobacco: Never Used  Substance and Sexual Activity  . Alcohol use: No  . Drug use: No  . Sexual activity: Not Currently  Lifestyle  . Physical activity:    Days per week: Not on file    Minutes per session: Not on file  . Stress: Not on file  Relationships  . Social connections:    Talks on phone: Not on file    Gets together: Not on file    Attends religious service: Not on file    Active member of club or organization: Not on file    Attends meetings of clubs or organizations: Not on file    Relationship status: Not on file  Other Topics Concern  . Not on file  Social History Narrative  . Not on file      Recent Hospitalizations? No  Health Habits: Current exercise activities include: walking Exercise: 3 times/week. Diet: in general, a "healthy" diet    Alcohol intake: none  Health Risk Assessment: The patient has completed a Health Risk Assessment. This has been reveiwed with them and has been scanned into the [facility] system as an attached document.  Current Medical Providers and Suppliers: [facility] Patient Care Team: Forrest Moron, MD as PCP - General (Internal Medicine) Arvella Nigh, MD as Consulting  Physician (Obstetrics and Gynecology) Sueanne Margarita, MD as Consulting Physician (Cardiology) No future appointments.   Age-appropriate Screening Schedule: The list below includes current immunization status and future screening recommendations based on patient's age. Orders for these recommended tests are listed in the plan section. The patient has been provided with a written plan. Immunization History  Administered Date(s) Administered  . Influenza Split 06/19/2014  . Influenza, High Dose Seasonal PF 07/16/2018  . Influenza-Unspecified 05/31/2015, 06/13/2016  . Pneumococcal Conjugate-13 04/14/2017  . Pneumococcal Polysaccharide-23 06/28/2009  Health Maintenance reviewed -  urine microalbumin ordered, Goes to [facility] for Mammogram    Depression Screen-PHQ2/9 completed today  Depression screen [facility] 2/9 01/21/2019 01/21/2019 02/22/2018 04/14/2017 04/14/2017  Decreased Interest 0 0 0 0 0  Down, Depressed, Hopeless 0 0 0 0 0  PHQ - 2 Score 0 0 0 0 0      Depression Severity and Treatment Recommendations:  0-4= None  5-9= Mild / Treatment: Support, educate to call if worse; return in one month  10-14= Moderate / Treatment: Support, watchful waiting; Antidepressant or Psycotherapy  15-19= Moderately severe / Treatment: Antidepressant OR Psychotherapy  >= 20 = Major depression, severe / Antidepressant AND Psychotherapy  Functional Status Survey:      Hearing Evaluation: 1. Do you have trouble hearing the television when others do not?   No 2. Do you have to strain to hear/understand conversations? No   Advanced Care Planning: 1. Patient has executed an Advance Directive: Yes 2. If no, patient was given the opportunity to execute an Advance Directive today? No 3. Are the patient's advanced directives in [place]? No 4. This patient has the ability to prepare an Advance Directive: Yes 5. Provider is willing to follow the patient's wishes: Yes  Cognitive  Assessment: Does the patient have evidence of cognitive impairment? No The patient does not have any evidence of any cognitive problems and denies any  change in mood/affect, appearance, speech, memory or motor skills.  Identification of Risk Factors: Risk factors include: hyperlipidemia and hypertension  ROS Review of Systems  Constitutional: Negative for activity change, appetite change, chills and fever.  HENT: Negative for congestion, nosebleeds, trouble swallowing and voice change.   Respiratory: Negative for cough, shortness of breath and wheezing.   Gastrointestinal: Negative for diarrhea, nausea and vomiting.  Genitourinary: Negative for difficulty urinating, dysuria, flank pain and hematuria.  Musculoskeletal: Negative for back pain, joint swelling and neck pain.  Neurological: Negative for dizziness, speech difficulty, light-headedness and numbness.  See HPI. All other review of systems negative.   Objective:   There were no vitals filed for this visit.  There is no height or weight on file to calculate BMI.  BP Readings from Last 3 Encounters:  08/17/18 (!) 142/70  02/22/18 120/60  09/07/17 (!) 142/78    normal effort Normal speech     Assessment/Plan:   Patient Self-Management and Personalized Health Advice The patient has been provided with information about:  begin progressive daily aerobic exercise program, reduce salt in diet and cooking, improve dietary compliance and continue current medications  During the course of the visit the patient was educated and counseled about appropriate screening and preventive services including:   return annually or prn       [place] was seen today for annual exam.  Diagnoses and all orders for this visit:  Medicare annual wellness visit, subsequent - Women's Health Maintenance Plan Advised monthly breast exam and annual mammogram Advised dental exam every six months Discussed stress management  Need for  shingles vaccine- will find out if we can order vaccine for patient to be delivered to our practice   Need for tetanus booster -     Cancel: Td vaccine greater than or equal to 7yo preservative free IM -     Td vaccine greater than or equal to 7yo preservative free IM; Future  Essential hypertension- stable and asymptomatic  Mild exercise-induced asthma-  Advised pt to use Albuterol  -     albuterol (VENTOLIN  HFA) 108 (90 Base) MCG/ACT inhaler; Inhale 2 puffs into the lungs every 6 (six) hours as needed for wheezing or shortness of breath. -     montelukast (SINGULAIR) 10 MG tablet; Take 1 tablet (10 mg total) by mouth at bedtime.      No follow-ups on file.  No future appointments.  Patient Instructions       If you have lab work done today you will be contacted with your lab results within the next 2 weeks.  If you have not heard from [place] then please contact us. The fastest way to get your results is to register for My Chart.   IF you received an x-ray today, you will receive an invoice from [facility]. Please contact [facility] at [phone] with questions or concerns regarding your invoice.   IF you received labwork today, you will receive an invoice from [facility]. Please contact [facility] at [phone] with questions or concerns regarding your invoice.   Our billing staff will not be able to assist you with questions regarding bills from these companies.  You will be contacted with the lab results as soon as they are available. The fastest way to get your results is to activate your My Chart account. Instructions are located on the last page of this paperwork. If you have not heard from [place] regarding the results in 2 weeks, please contact this office.       An after visit summary with all of these plans was given to the patient.     I discussed the assessment and treatment plan with the patient. The patient was provided an opportunity to ask  questions and all were answered. The patient agreed with the plan and demonstrated an understanding of the instructions.   The patient was advised to call back or seek an in-person evaluation if the symptoms worsen or if the condition fails to improve as anticipated.  I provided 30 minutes of non-face-to-face time during this encounter.

## 2019-01-21 NOTE — Patient Instructions (Signed)
° ° ° °  If you have lab work done today you will be contacted with your lab results within the next 2 weeks.  If you have not heard from us then please contact us. The fastest way to get your results is to register for My Chart. ° ° °IF you received an x-ray today, you will receive an invoice from [facility]. Please contact  Radiology at [phone] with questions or concerns regarding your invoice.  ° °IF you received labwork today, you will receive an invoice from [facility]. Please contact [facility] at [phone] with questions or concerns regarding your invoice.  ° °Our billing staff will not be able to assist you with questions regarding bills from these companies. ° °You will be contacted with the lab results as soon as they are available. The fastest way to get your results is to activate your My Chart account. Instructions are located on the last page of this paperwork. If you have not heard from us regarding the results in 2 weeks, please contact this office. °  ° ° ° °

## 2019-01-24 DIAGNOSIS — L821 Other seborrheic keratosis: Secondary | ICD-10-CM | POA: Diagnosis not present

## 2019-01-24 DIAGNOSIS — Z85828 Personal history of other malignant neoplasm of skin: Secondary | ICD-10-CM | POA: Diagnosis not present

## 2019-01-24 DIAGNOSIS — Z86006 Personal history of melanoma in-situ: Secondary | ICD-10-CM | POA: Diagnosis not present

## 2019-01-24 DIAGNOSIS — L570 Actinic keratosis: Secondary | ICD-10-CM | POA: Diagnosis not present

## 2019-02-05 ENCOUNTER — Ambulatory Visit (INDEPENDENT_AMBULATORY_CARE_PROVIDER_SITE_OTHER): Payer: Medicare Other | Admitting: Family Medicine

## 2019-02-05 ENCOUNTER — Other Ambulatory Visit: Payer: Self-pay

## 2019-02-05 DIAGNOSIS — I10 Essential (primary) hypertension: Secondary | ICD-10-CM

## 2019-02-05 DIAGNOSIS — Z87898 Personal history of other specified conditions: Secondary | ICD-10-CM | POA: Diagnosis not present

## 2019-02-06 LAB — CMP14+EGFR
ALT: 15 IU/L (ref 0–32)
AST: 22 IU/L (ref 0–40)
Albumin/Globulin Ratio: 1.4 (ref 1.2–2.2)
Albumin: 3.9 g/dL (ref 3.7–4.7)
Alkaline Phosphatase: 97 IU/L (ref 39–117)
BUN/Creatinine Ratio: 20 (ref 12–28)
BUN: 16 mg/dL (ref 8–27)
Bilirubin Total: 0.6 mg/dL (ref 0.0–1.2)
CO2: 22 mmol/L (ref 20–29)
Calcium: 9.4 mg/dL (ref 8.7–10.3)
Chloride: 99 mmol/L (ref 96–106)
Creatinine, Ser: 0.82 mg/dL (ref 0.57–1.00)
GFR calc Af Amer: 80 mL/min/{1.73_m2} (ref >59)
GFR calc non Af Amer: 69 mL/min/{1.73_m2} (ref >59)
Globulin, Total: 2.8 g/dL (ref 1.5–4.5)
Glucose: 95 mg/dL (ref 65–99)
Potassium: 4.3 mmol/L (ref 3.5–5.2)
Sodium: 133 mmol/L — ABNORMAL LOW (ref 134–144)
Total Protein: 6.7 g/dL (ref 6.0–8.5)

## 2019-02-06 LAB — LIPID PANEL
Chol/HDL Ratio: 3.2 ratio (ref 0.0–4.4)
Cholesterol, Total: 161 mg/dL (ref 100–199)
HDL: 50 mg/dL (ref >39)
LDL Calculated: 88 mg/dL (ref 0–99)
Triglycerides: 116 mg/dL (ref 0–149)
VLDL Cholesterol Cal: 23 mg/dL (ref 5–40)

## 2019-02-12 ENCOUNTER — Other Ambulatory Visit: Payer: Self-pay | Admitting: Family Medicine

## 2019-02-12 DIAGNOSIS — J45990 Exercise induced bronchospasm: Secondary | ICD-10-CM

## 2019-03-05 ENCOUNTER — Other Ambulatory Visit: Payer: Self-pay | Admitting: Cardiology

## 2019-03-12 ENCOUNTER — Encounter: Payer: Self-pay | Admitting: Internal Medicine

## 2019-03-26 DIAGNOSIS — Z6835 Body mass index (BMI) 35.0-35.9, adult: Secondary | ICD-10-CM | POA: Diagnosis not present

## 2019-03-26 DIAGNOSIS — Z01419 Encounter for gynecological examination (general) (routine) without abnormal findings: Secondary | ICD-10-CM | POA: Diagnosis not present

## 2019-03-26 DIAGNOSIS — Z1231 Encounter for screening mammogram for malignant neoplasm of breast: Secondary | ICD-10-CM | POA: Diagnosis not present

## 2019-04-23 ENCOUNTER — Ambulatory Visit: Payer: Medicare Other | Admitting: *Deleted

## 2019-04-23 ENCOUNTER — Other Ambulatory Visit: Payer: Self-pay

## 2019-04-23 VITALS — Ht 63.0 in | Wt 194.0 lb

## 2019-04-23 DIAGNOSIS — Z1211 Encounter for screening for malignant neoplasm of colon: Secondary | ICD-10-CM

## 2019-04-23 MED ORDER — SUPREP BOWEL PREP KIT 17.5-3.13-1.6 GM/177ML PO SOLN: 1.0000 | Freq: Once | ORAL | 0 refills | Status: AC

## 2019-04-23 NOTE — Progress Notes (Signed)
No egg or soy allergy known to patient  No issues with past sedation with any surgeries  or procedures, no intubation problems  No diet pills per patient No home 02 use per patient  No blood thinners per patient  Pt denies issues with constipation  No A fib or A flutter  EMMI video sent to pt's e mail   Pt states when she has a BM she feels a pouch and this is new - please evaluate   Pt verified name, DOB, address and insurance during PV today. Pt mailed instruction packet to included paper to complete and mail back to [facility] with addressed and stamped envelope, Emmi video, copy of consent form to read and not return, and instructions.  PV completed over the phone. Pt encouraged to call with questions or issues   Pt is aware that care partner will wait in the car during procedure; if they feel like they will be too hot to wait in the car; they may wait in the lobby.  We want them to wear a mask (we do not have any that we can provide them), practice social distancing, and we will check their temperatures when they get here.  I did remind patient that their care partner needs to stay in the parking lot the entire time. Pt will wear mask into building.

## 2019-05-06 ENCOUNTER — Telehealth: Payer: Self-pay

## 2019-05-06 NOTE — Telephone Encounter (Signed)
Covid-19 screening questions   Do you now or have you had a fever in the last 14 days? NO   Do you have any respiratory symptoms of shortness of breath or cough now or in the last 14 days? NO, just the normal D/t Asthma   Do you have any family members or close contacts with diagnosed or suspected Covid-19 in the past 14 days? NO   Have you been tested for Covid-19 and found to be positive? NO

## 2019-05-07 ENCOUNTER — Encounter: Payer: Self-pay | Admitting: Internal Medicine

## 2019-05-07 ENCOUNTER — Other Ambulatory Visit: Payer: Self-pay

## 2019-05-07 ENCOUNTER — Ambulatory Visit (AMBULATORY_SURGERY_CENTER): Payer: Medicare Other | Admitting: Internal Medicine

## 2019-05-07 VITALS — BP 110/47 | HR 68 | Temp 98.4°F | Resp 18 | Ht 63.0 in | Wt 194.0 lb

## 2019-05-07 DIAGNOSIS — Z1211 Encounter for screening for malignant neoplasm of colon: Secondary | ICD-10-CM

## 2019-05-07 DIAGNOSIS — D120 Benign neoplasm of cecum: Secondary | ICD-10-CM | POA: Diagnosis not present

## 2019-05-07 MED ORDER — SODIUM CHLORIDE 0.9 % IV SOLN: 500.0000 mL | Freq: Once | INTRAVENOUS | Status: DC

## 2019-05-07 NOTE — Progress Notes (Signed)
Called to room to assist during endoscopic procedure.  Patient ID and intended procedure confirmed with present staff. Received instructions for my participation in the procedure from the performing physician.  

## 2019-05-07 NOTE — Patient Instructions (Signed)
YOU HAD AN ENDOSCOPIC PROCEDURE TODAY AT [facility]:   Refer to the procedure report that was given to you for any specific questions about what was found during the examination.  If the procedure report does not answer your questions, please call your gastroenterologist to clarify.  If you requested that your care partner not be given the details of your procedure findings, then the procedure report has been included in a sealed envelope for you to review at your convenience later.  YOU SHOULD EXPECT: Some feelings of bloating in the abdomen. Passage of more gas than usual.  Walking can help get rid of the air that was put into your GI tract during the procedure and reduce the bloating. If you had a lower endoscopy (such as a colonoscopy or flexible sigmoidoscopy) you may notice spotting of blood in your stool or on the toilet paper. If you underwent a bowel prep for your procedure, you may not have a normal bowel movement for a few days.  Please Note:  You might notice some irritation and congestion in your nose or some drainage.  This is from the oxygen used during your procedure.  There is no need for concern and it should clear up in a day or so.  SYMPTOMS TO REPORT IMMEDIATELY:   Following lower endoscopy (colonoscopy or flexible sigmoidoscopy):  Excessive amounts of blood in the stool  Significant tenderness or worsening of abdominal pains  Swelling of the abdomen that is new, acute  Fever of 100F or higher  For urgent or emergent issues, a gastroenterologist can be reached at any hour by calling [phone].  DIET:  We do recommend a small meal at first, but then you may proceed to your regular diet.  Drink plenty of fluids but you should avoid alcoholic beverages for 24 hours.  ACTIVITY:  You should plan to take it easy for the rest of today and you should NOT DRIVE or use heavy machinery until tomorrow (because of the sedation medicines used during the test).     FOLLOW UP: Our staff will call the number listed on your records 48-72 hours following your procedure to check on you and address any questions or concerns that you may have regarding the information given to you following your procedure. If we do not reach you, we will leave a message.  We will attempt to reach you two times.  During this call, we will ask if you have developed any symptoms of COVID 19. If you develop any symptoms (ie: fever, flu-like symptoms, shortness of breath, cough etc.) before then, please call [phone].  If you test positive for Covid 19 in the 2 weeks post procedure, please call and report this information to [place].    If any biopsies were taken you will be contacted by phone or by letter within the next 1-3 weeks.  Please call us at [phone] if you have not heard about the biopsies in 3 weeks.   SIGNATURES/CONFIDENTIALITY: You and/or your care partner have signed paperwork which will be entered into your electronic medical record.  These signatures attest to the fact that that the information above on your After Visit Summary has been reviewed and is understood.  Full responsibility of the confidentiality of this discharge information lies with you and/or your care-partner.  Await pathology  No need for further colonoscopies unless having any difficulties or issues  Continue your normal medications  Please read over handouts about polys and diverticulosis

## 2019-05-07 NOTE — Progress Notes (Signed)
PT taken to PACU. Monitors in place. VSS. Report given to RN. 

## 2019-05-07 NOTE — Op Note (Signed)
[place] Patient Name: Kerry [name] Procedure Date: 05/07/2019 8:36 AM MRN: [id number] Endoscopist: Docia Chuck. Henrene Pastor , MD Age: 78 Referring MD:  Date of Birth: April 30, 1941 Gender: Female Account #: [id number] Procedure:                Colonoscopy with cold snare polypectomy x 1 Indications:              Screening for colorectal malignant neoplasm.                            Previous examinations 2001 and 2008 both negative                            for neoplasia Medicines:                Monitored Anesthesia Care Procedure:                Pre-Anesthesia Assessment:                           - Prior to the procedure, a History and Physical                            was performed, and patient medications and                            allergies were reviewed. The patient's tolerance of                            previous anesthesia was also reviewed. The risks                            and benefits of the procedure and the sedation                            options and risks were discussed with the patient.                            All questions were answered, and informed consent                            was obtained. Prior Anticoagulants: The patient has                            taken no previous anticoagulant or antiplatelet                            agents. ASA Grade Assessment: II - A patient with                            mild systemic disease. After reviewing the risks                            and benefits, the patient was deemed in  satisfactory condition to undergo the procedure.                           After obtaining informed consent, the colonoscope                            was passed under direct vision. Throughout the                            procedure, the patient's blood pressure, pulse, and                            oxygen saturations were monitored continuously. The                            Colonoscope was  introduced through the anus and                            advanced to the the cecum, identified by                            appendiceal orifice and ileocecal valve. The                            ileocecal valve, appendiceal orifice, and rectum                            were photographed. The quality of the bowel                            preparation was excellent. The colonoscopy was                            performed without difficulty. The patient tolerated                            the procedure well. The bowel preparation used was                            SUPREP via split dose instruction. Scope In: 8:50:37 AM Scope Out: 9:01:14 AM Scope Withdrawal Time: 0 hours 6 minutes 53 seconds  Total Procedure Duration: 0 hours 10 minutes 37 seconds  Findings:                 A 1 mm polyp was found in the cecum. The polyp was                            removed with a cold snare. Resection and retrieval                            were complete.                           Multiple diverticula were found in the left colon  and right colon.                           The exam was otherwise without abnormality on                            direct and retroflexion views. Complications:            No immediate complications. Estimated blood loss:                            None. Estimated Blood Loss:     Estimated blood loss: none. Impression:               - One 1 mm polyp in the cecum, removed with a cold                            snare. Resected and retrieved.                           - Diverticulosis in the left colon and in the right                            colon.                           - The examination was otherwise normal on direct                            and retroflexion views. Recommendation:           - Repeat colonoscopy is not recommended for                            surveillance.                           - Patient has a contact number  available for                            emergencies. The signs and symptoms of potential                            delayed complications were discussed with the                            patient. Return to normal activities tomorrow.                            Written discharge instructions were provided to the                            patient.                           - Resume previous diet.                           -  Continue present medications.                           - Await pathology results. Docia Chuck. Henrene Pastor, MD 05/07/2019 9:07:50 AM This report has been signed electronically.

## 2019-05-09 ENCOUNTER — Telehealth: Payer: Self-pay | Admitting: *Deleted

## 2019-05-09 NOTE — Telephone Encounter (Signed)
   Call back number 05/07/2019  Post procedure Call Back phone  # [phone]  Permission to leave phone message Yes  Some recent data might be hidden     Patient questions: Left mess on f/u call

## 2019-05-09 NOTE — Telephone Encounter (Signed)
Pt called back and stated she was feeling fine.

## 2019-05-09 NOTE — Telephone Encounter (Signed)
  Follow up Call-  Call back number 05/07/2019  Post procedure Call Back phone  # [phone]  Permission to leave phone message Yes  Some recent data might be hidden     Patient questions:  Do you have a fever, pain , or abdominal swelling? No. Pain Score  0 *  Have you tolerated food without any problems? Yes.    Have you been able to return to your normal activities? Yes.    Do you have any questions about your discharge instructions: Diet   No. Medications  No. Follow up visit  No.  Do you have questions or concerns about your Care? No.  Actions: * If pain score is 4 or above: No action needed, pain <4.  1. Have you developed a fever since your procedure? no  2.   Have you had an respiratory symptoms (SOB or cough) since your procedure? no  3.   Have you tested positive for COVID 19 since your procedure no  4.   Have you had any family members/close contacts diagnosed with the COVID 19 since your procedure?  no   If yes to any of these questions please route to Joylene John, RN and Alphonsa Gin, Therapist, sports.

## 2019-05-10 ENCOUNTER — Encounter: Payer: Self-pay | Admitting: Internal Medicine

## 2019-05-28 DIAGNOSIS — L570 Actinic keratosis: Secondary | ICD-10-CM | POA: Diagnosis not present

## 2019-06-20 ENCOUNTER — Observation Stay (HOSPITAL_COMMUNITY)
Admission: EM | Admit: 2019-06-20 | Discharge: 2019-06-21 | Disposition: A | Discharge status: Home / Self Care | Payer: Medicare Other | Attending: Internal Medicine | Admitting: Internal Medicine

## 2019-06-20 ENCOUNTER — Encounter (HOSPITAL_COMMUNITY): Payer: Self-pay | Admitting: Emergency Medicine

## 2019-06-20 ENCOUNTER — Other Ambulatory Visit: Payer: Self-pay

## 2019-06-20 ENCOUNTER — Emergency Department (HOSPITAL_COMMUNITY): Payer: Medicare Other

## 2019-06-20 DIAGNOSIS — K219 Gastro-esophageal reflux disease without esophagitis: Secondary | ICD-10-CM | POA: Diagnosis not present

## 2019-06-20 DIAGNOSIS — R200 Anesthesia of skin: Secondary | ICD-10-CM | POA: Diagnosis not present

## 2019-06-20 DIAGNOSIS — Z20828 Contact with and (suspected) exposure to other viral communicable diseases: Secondary | ICD-10-CM | POA: Diagnosis not present

## 2019-06-20 DIAGNOSIS — R2681 Unsteadiness on feet: Secondary | ICD-10-CM | POA: Diagnosis not present

## 2019-06-20 DIAGNOSIS — E871 Hypo-osmolality and hyponatremia: Secondary | ICD-10-CM | POA: Diagnosis not present

## 2019-06-20 DIAGNOSIS — I6782 Cerebral ischemia: Secondary | ICD-10-CM | POA: Insufficient documentation

## 2019-06-20 DIAGNOSIS — Z03818 Encounter for observation for suspected exposure to other biological agents ruled out: Secondary | ICD-10-CM | POA: Diagnosis not present

## 2019-06-20 DIAGNOSIS — Z79899 Other long term (current) drug therapy: Secondary | ICD-10-CM | POA: Diagnosis not present

## 2019-06-20 DIAGNOSIS — J0100 Acute maxillary sinusitis, unspecified: Secondary | ICD-10-CM | POA: Insufficient documentation

## 2019-06-20 DIAGNOSIS — Z7982 Long term (current) use of aspirin: Secondary | ICD-10-CM | POA: Diagnosis not present

## 2019-06-20 DIAGNOSIS — G43909 Migraine, unspecified, not intractable, without status migrainosus: Secondary | ICD-10-CM | POA: Diagnosis not present

## 2019-06-20 DIAGNOSIS — G459 Transient cerebral ischemic attack, unspecified: Principal | ICD-10-CM | POA: Insufficient documentation

## 2019-06-20 DIAGNOSIS — J4520 Mild intermittent asthma, uncomplicated: Secondary | ICD-10-CM | POA: Diagnosis not present

## 2019-06-20 DIAGNOSIS — Z8542 Personal history of malignant neoplasm of other parts of uterus: Secondary | ICD-10-CM | POA: Insufficient documentation

## 2019-06-20 DIAGNOSIS — I10 Essential (primary) hypertension: Secondary | ICD-10-CM | POA: Diagnosis not present

## 2019-06-20 DIAGNOSIS — Z85820 Personal history of malignant melanoma of skin: Secondary | ICD-10-CM | POA: Diagnosis not present

## 2019-06-20 DIAGNOSIS — I351 Nonrheumatic aortic (valve) insufficiency: Secondary | ICD-10-CM | POA: Insufficient documentation

## 2019-06-20 DIAGNOSIS — M1990 Unspecified osteoarthritis, unspecified site: Secondary | ICD-10-CM | POA: Insufficient documentation

## 2019-06-20 DIAGNOSIS — R4701 Aphasia: Secondary | ICD-10-CM | POA: Diagnosis not present

## 2019-06-20 DIAGNOSIS — I110 Hypertensive heart disease with heart failure: Secondary | ICD-10-CM | POA: Insufficient documentation

## 2019-06-20 DIAGNOSIS — J45909 Unspecified asthma, uncomplicated: Secondary | ICD-10-CM | POA: Diagnosis not present

## 2019-06-20 DIAGNOSIS — E785 Hyperlipidemia, unspecified: Secondary | ICD-10-CM | POA: Diagnosis not present

## 2019-06-20 LAB — CBC
HCT: 35.4 % — ABNORMAL LOW (ref 36.0–46.0)
Hemoglobin: 12 g/dL (ref 12.0–15.0)
MCH: 31.2 pg (ref 26.0–34.0)
MCHC: 33.9 g/dL (ref 30.0–36.0)
MCV: 91.9 fL (ref 80.0–100.0)
Platelets: 217 10*3/uL (ref 150–400)
RBC: 3.85 MIL/uL — ABNORMAL LOW (ref 3.87–5.11)
RDW: 12.2 % (ref 11.5–15.5)
WBC: 11.2 10*3/uL — ABNORMAL HIGH (ref 4.0–10.5)
nRBC: 0 % (ref 0.0–0.2)

## 2019-06-20 LAB — COMPREHENSIVE METABOLIC PANEL
ALT: 21 U/L (ref 0–44)
AST: 30 U/L (ref 15–41)
Albumin: 4 g/dL (ref 3.5–5.0)
Alkaline Phosphatase: 106 U/L (ref 38–126)
Anion gap: 12 (ref 5–15)
BUN: 19 mg/dL (ref 8–23)
CO2: 22 mmol/L (ref 22–32)
Calcium: 9.1 mg/dL (ref 8.9–10.3)
Chloride: 96 mmol/L — ABNORMAL LOW (ref 98–111)
Creatinine, Ser: 0.81 mg/dL (ref 0.44–1.00)
GFR calc Af Amer: >60 mL/min (ref >60)
GFR calc non Af Amer: >60 mL/min (ref >60)
Glucose, Bld: 102 mg/dL — ABNORMAL HIGH (ref 70–99)
Potassium: 4 mmol/L (ref 3.5–5.1)
Sodium: 130 mmol/L — ABNORMAL LOW (ref 135–145)
Total Bilirubin: 0.5 mg/dL (ref 0.3–1.2)
Total Protein: 7.9 g/dL (ref 6.5–8.1)

## 2019-06-20 LAB — URINALYSIS, ROUTINE W REFLEX MICROSCOPIC
Bacteria, UA: NONE SEEN
Bilirubin Urine: NEGATIVE
Glucose, UA: NEGATIVE mg/dL
Hgb urine dipstick: NEGATIVE
Ketones, ur: NEGATIVE mg/dL
Nitrite: NEGATIVE
Protein, ur: NEGATIVE mg/dL
Specific Gravity, Urine: 1.013 (ref 1.005–1.030)
pH: 5 (ref 5.0–8.0)

## 2019-06-20 LAB — DIFFERENTIAL
Abs Immature Granulocytes: 0.04 10*3/uL (ref 0.00–0.07)
Basophils Absolute: 0.1 10*3/uL (ref 0.0–0.1)
Basophils Relative: 1 %
Eosinophils Absolute: 0.6 10*3/uL — ABNORMAL HIGH (ref 0.0–0.5)
Eosinophils Relative: 5 %
Immature Granulocytes: 0 %
Lymphocytes Relative: 14 %
Lymphs Abs: 1.6 10*3/uL (ref 0.7–4.0)
Monocytes Absolute: 1.2 10*3/uL — ABNORMAL HIGH (ref 0.1–1.0)
Monocytes Relative: 11 %
Neutro Abs: 7.7 10*3/uL (ref 1.7–7.7)
Neutrophils Relative %: 69 %

## 2019-06-20 LAB — I-STAT CHEM 8, ED
BUN: 19 mg/dL (ref 8–23)
Calcium, Ion: 1.15 mmol/L (ref 1.15–1.40)
Chloride: 96 mmol/L — ABNORMAL LOW (ref 98–111)
Creatinine, Ser: 0.8 mg/dL (ref 0.44–1.00)
Glucose, Bld: 99 mg/dL (ref 70–99)
HCT: 36 % (ref 36.0–46.0)
Hemoglobin: 12.2 g/dL (ref 12.0–15.0)
Potassium: 3.9 mmol/L (ref 3.5–5.1)
Sodium: 131 mmol/L — ABNORMAL LOW (ref 135–145)
TCO2: 23 mmol/L (ref 22–32)

## 2019-06-20 LAB — RAPID URINE DRUG SCREEN, HOSP PERFORMED
Amphetamines: NOT DETECTED
Barbiturates: NOT DETECTED
Benzodiazepines: NOT DETECTED
Cocaine: NOT DETECTED
Opiates: NOT DETECTED
Tetrahydrocannabinol: NOT DETECTED

## 2019-06-20 LAB — APTT: aPTT: 30 seconds (ref 24–36)

## 2019-06-20 LAB — ETHANOL: Alcohol, Ethyl (B): <10 mg/dL (ref <10)

## 2019-06-20 LAB — PROTIME-INR
INR: 0.9 (ref 0.8–1.2)
Prothrombin Time: 12.4 seconds (ref 11.4–15.2)

## 2019-06-20 NOTE — Consult Note (Signed)
Referring Physician: Dr. Sedonia Small    Chief Complaint: Transient aphasia and right sided numbness  HPI: Kerry [name] is an 78 y.o. female who presented to the [facility] ED on Thursday after an episode of transient aphasia and right sided numbness. Aphasia consisted of slurred speech with word finding difficulty. Symptoms had resolved by the time she came to the ED. Onset of symptoms was at 1800 and lasted for approximately 15 minutes. She takes ASA intermittently at home. She thought she was experiencing onset of one of her migraines, but overall symptoms were different than usual and she had no headache afterwards.   CT head in the ED:  1. No acute abnormality. 2. Minimal diffuse cerebral and cerebellar atrophy with minimal progression. 3. Acute bilateral maxillary sinusitis.  MRI brain:  1. No acute intracranial abnormality. 2. Mild chronic small vessel disease  LSN: 1800 on Thursday tPA Given: No: Symptoms resolved  Past Medical History:  Diagnosis Date  . Allergy   . Anemia    in her 20's  . Arthritis    not dx'd- just per pt   . Asthma    AS A CHILD  . Cancer [facility])    endometrial cancer with hysterectomy in 1987  . Cancer ([place])    melanoma on face   . Cataract    removed both eyes   . GERD (gastroesophageal reflux disease)    occ has with diet   . History of syncope   . HTN (hypertension)   . Hyperlipidemia     Past Surgical History:  Procedure Laterality Date  . ABDOMINAL HYSTERECTOMY    . ANKLE FRACTURE SURGERY    . APPENDECTOMY    . CATARACT EXTRACTION, BILATERAL    . CHOLECYSTECTOMY    . COLONOSCOPY  08/01/2007   Dr Henrene Pastor   . DENTAL SURGERY    . right leg  fracture surgery       Family History  Problem Relation Age of Onset  . Mitral valve prolapse Daughter   . Heart disease Mother   . Kidney failure Mother   . Diabetes Mother   . Cancer Mother        uterine  . Hyperlipidemia Mother   . Hypertension Mother   . Uterine cancer Mother   . Dementia Father    . Diabetes Sister   . Diabetes Paternal Grandmother   . Throat cancer Brother   . Colon cancer Neg Hx   . Colon polyps Neg Hx   . Esophageal cancer Neg Hx   . Rectal cancer Neg Hx   . Stomach cancer Neg Hx    Social History:  reports that she has never smoked. She has never used smokeless tobacco. She reports that she does not drink alcohol or use drugs.  Allergies:  Allergies  Allergen Reactions  . Lovastatin Other (See Comments)    myalgias  . Sudafed [Pseudoephedrine Hcl] Other (See Comments)    Hallucinations     Medications:  No current facility-administered medications on file prior to encounter.    Current Outpatient Medications on File Prior to Encounter  Medication Sig Dispense Refill  . acetaminophen (TYLENOL) 500 MG tablet Take 500 mg by mouth every 6 (six) hours as needed for moderate pain or headache.     . albuterol (VENTOLIN HFA) 108 (90 Base) MCG/ACT inhaler Inhale 2 puffs into the lungs every 6 (six) hours as needed for wheezing or shortness of breath. 1 Inhaler 0  . Ascorbic Acid (VITAMIN C PO)  Take 1 tablet by mouth daily.    Marland Kitchen aspirin 81 MG chewable tablet Chew 81 mg by mouth daily. Every other day per pt    . Cholecalciferol (VITAMIN D3) 2000 units capsule Take 2,000 Units by mouth daily.    . Coenzyme Q10 (CO Q 10 PO) Take 1 capsule by mouth daily.     . fexofenadine (ALLEGRA) 180 MG tablet Take 180 mg by mouth daily.    . hydrochlorothiazide (HYDRODIURIL) 25 MG tablet TAKE 1 TABLET(25 MG) BY MOUTH DAILY (Patient taking differently: Take 25 mg by mouth daily. ) 90 tablet 1  . lisinopril (PRINIVIL,ZESTRIL) 20 MG tablet TAKE 1 TABLET BY MOUTH EVERY DAY (Patient taking differently: Take 20 mg by mouth daily. ) 90 tablet 3  . metoprolol succinate (TOPROL-XL) 25 MG 24 hr tablet TAKE 1 TABLET BY MOUTH TWICE DAILY (Patient taking differently: Take 25 mg by mouth 2 (two) times daily. ** DO NOT CRUSH **    (BETA BLOCKER)) 180 tablet 3  . montelukast (SINGULAIR) 10  MG tablet Take 1 tablet (10 mg total) by mouth at bedtime. 90 tablet 1  . Multiple Vitamin (MULTIVITAMIN) tablet Take 1 tablet by mouth daily.         ROS: As per HPI. Comprehensive ROS otherwise negative.   Physical Examination: Blood pressure 126/65, pulse 75, temperature 98.4 F (36.9 C), temperature source Oral, resp. rate 19, SpO2 98 %.  HEENT: [place]/AT Lungs: Respirations unlabored Ext: Warm and well perfused  Neurologic Examination: Mental Status: Alert, fully oriented, thought content appropriate.  Speech fluent with intact naming and comprehension.  Cranial Nerves: II:  Visual fields intact with no extinction to DSS. PERRL.  III,IV, VI: No ptosis. EOMI.  V,VII: Smile symmetric, facial temp sensation normal bilaterally VIII: hearing intact to conversation IX,X: Palate rises symmetrically XI: Symmetric shoulder shrug XII: midline tongue extension  Motor: Right : Upper extremity   5/5    Left:     Upper extremity   5/5  Lower extremity   5/5     Lower extremity   5/5 Normal tone throughout; no atrophy noted No pronator drift Sensory: Temp and light touch intact bilaterally Deep Tendon Reflexes:  Normoactive x 4.  Cerebellar: No ataxia with FNF bilaterally  Gait: Deferred  Results for orders placed or performed during the hospital encounter of 06/20/19 (from the past 48 hour(s))  Ethanol     Status: None   Collection Time: 06/20/19  7:00 PM  Result Value Ref Range   Alcohol, Ethyl (B) <10 <10 mg/dL    Comment: (NOTE) Lowest detectable limit for serum alcohol is 10 mg/dL. For medical purposes only. Performed at [facility], [place] [address] [place], [place] [address]   CBC     Status: Abnormal   Collection Time: 06/20/19  7:00 PM  Result Value Ref Range   WBC 11.2 (H) 4.0 - 10.5 K/uL   RBC 3.85 (L) 3.87 - 5.11 MIL/uL   Hemoglobin 12.0 12.0 - 15.0 g/dL   HCT 35.4 (L) 36.0 - 46.0 %   MCV 91.9 80.0 - 100.0 fL   MCH 31.2 26.0 - 34.0 pg   MCHC  33.9 30.0 - 36.0 g/dL   RDW 12.2 11.5 - 15.5 %   Platelets 217 150 - 400 K/uL   nRBC 0.0 0.0 - 0.2 %    Comment: Performed at [facility], [place] [address], [place], [place] [address]  Differential     Status: Abnormal  Collection Time: 06/20/19  7:00 PM  Result Value Ref Range   Neutrophils Relative % 69 %   Neutro Abs 7.7 1.7 - 7.7 K/uL   Lymphocytes Relative 14 %   Lymphs Abs 1.6 0.7 - 4.0 K/uL   Monocytes Relative 11 %   Monocytes Absolute 1.2 (H) 0.1 - 1.0 K/uL   Eosinophils Relative 5 %   Eosinophils Absolute 0.6 (H) 0.0 - 0.5 K/uL   Basophils Relative 1 %   Basophils Absolute 0.1 0.0 - 0.1 K/uL   Immature Granulocytes 0 %   Abs Immature Granulocytes 0.04 0.00 - 0.07 K/uL    Comment: Performed at [facility], [place] [address], [place], [place] [address]  Comprehensive metabolic panel     Status: Abnormal   Collection Time: 06/20/19  7:00 PM  Result Value Ref Range   Sodium 130 (L) 135 - 145 mmol/L   Potassium 4.0 3.5 - 5.1 mmol/L   [place] 96 (L) 98 - 111 mmol/L   CO2 22 22 - 32 mmol/L   Glucose, Bld 102 (H) 70 - 99 mg/dL   BUN 19 8 - 23 mg/dL   Creatinine, Ser 0.81 0.44 - 1.00 mg/dL   Calcium 9.1 8.9 - 10.3 mg/dL   Total Protein 7.9 6.5 - 8.1 g/dL   Albumin 4.0 3.5 - 5.0 g/dL   AST 30 15 - 41 U/L   ALT 21 0 - 44 U/L   Alkaline Phosphatase 106 38 - 126 U/L   Total Bilirubin 0.5 0.3 - 1.2 mg/dL   GFR calc non Af Amer >60 >60 mL/min   GFR calc Af Amer >60 >60 mL/min   Anion gap 12 5 - 15    Comment: Performed at [facility], [place] [address] [place], [place] [address]  Urine rapid drug screen (hosp performed)     Status: None   Collection Time: 06/20/19  7:00 PM  Result Value Ref Range   Opiates NONE DETECTED NONE DETECTED   Cocaine NONE DETECTED NONE DETECTED   Benzodiazepines NONE DETECTED NONE DETECTED   Amphetamines NONE DETECTED NONE DETECTED   Tetrahydrocannabinol NONE DETECTED NONE DETECTED    Barbiturates NONE DETECTED NONE DETECTED    Comment: (NOTE) DRUG SCREEN FOR MEDICAL PURPOSES ONLY.  IF CONFIRMATION IS NEEDED FOR ANY PURPOSE, NOTIFY LAB WITHIN 5 DAYS. LOWEST DETECTABLE LIMITS FOR URINE DRUG SCREEN Drug Class                     Cutoff (ng/mL) Amphetamine and metabolites    1000 Barbiturate and metabolites    200 Benzodiazepine                 [id number] Tricyclics and metabolites     300 Opiates and metabolites        300 Cocaine and metabolites        300 THC                            50 Performed at [facility], [place] [address], [place], [place] [address]   I-stat chem 8, ED     Status: Abnormal   Collection Time: 06/20/19  7:47 PM  Result Value Ref Range   Sodium 131 (L) 135 - 145 mmol/L   Potassium 3.9 3.5 - 5.1 mmol/L   Ch[place] 96 (L) 98 - 111 mmol/L   BUN 19 8 - 23 mg/dL   Creatinine, Ser  0.80 0.44 - 1.00 mg/dL   Glucose, Bld 99 70 - 99 mg/dL   Calcium, Ion 1.15 1.15 - 1.40 mmol/L   TCO2 23 22 - 32 mmol/L   Hemoglobin 12.2 12.0 - 15.0 g/dL   HCT 36.0 36.0 - 46.0 %   Ct Head Wo Contrast  Result Date: 06/20/2019 CLINICAL DATA:  Right-sided numbness and aphasia for approximately 15 minutes earlier this evening. EXAM: CT HEAD WITHOUT CONTRAST TECHNIQUE: Contiguous axial images were obtained from the base of the skull through the vertex without intravenous contrast. COMPARISON:  Brain MRI dated 08/29/2008. Head CT dated 08/28/2008. FINDINGS: Brain: Minimally prominent ventricles and subarachnoid spaces with minimal progression. No intracranial hemorrhage, mass lesion or CT evidence of acute infarction. Vascular: No hyperdense vessel or unexpected calcification. Skull: Normal. Negative for fracture or focal lesion. Sinuses/Orbits: Status post bilateral cataract extraction. Bilateral maxillary sinus air-fluid levels. Mild bilateral ethmoid sinus mucosal thickening and mild left frontal sinus mucosal thickening. Other: None. IMPRESSION: 1. No acute  abnormality. 2. Minimal diffuse cerebral and cerebellar atrophy with minimal progression. 3. Acute bilateral maxillary sinusitis. Electronically Signed   By: Claudie Revering M.D.   On: 06/20/2019 20:34    Assessment: 78 y.o. female presenting with TIA symptoms referable to the left MCA territory 1. Exam is nonfocal 2. Imaging studies are negative for acute stroke.  3.Not classifiable as having failed ASA, as she takes it intermittently. 4. Stroke Risk Factors - HTN, HLD and history of cancer  Plan: 1. HgbA1c, fasting lipid panel 2. MRA of the brain without contrast 3. PT consult, OT consult, Speech consult 4. Echocardiogram 5. Carotid dopplers 6. Prophylactic therapy- Continue ASA 7. Risk factor modification 8. Telemetry monitoring 9. Frequent neuro checks 10. Permissive HTN x 24 hours 11. Has had myalgias as side effect of statin therapy in the past. Would hold off on statin unless vascular imaging reveals findings that justify the risk of initiating.  @Electronically  signed: Dr. Kerney Elbe 06/20/2019, 8:41 PM

## 2019-06-20 NOTE — ED Notes (Signed)
PT ambulated to restroom with steady gate and not concerns.

## 2019-06-20 NOTE — ED Notes (Signed)
Notified EDP,Beros,MD. Pt. I-stat Chem 8 results sodium 131.

## 2019-06-20 NOTE — ED Triage Notes (Signed)
Patient reports at approximately 1800 she had numbness to right side and aphasia that lasted approximately fifteen minutes. Reports all symptoms have resolved at this time. Denies headache and blurred vision. Denies taking blood thinners.

## 2019-06-20 NOTE — ED Notes (Signed)
Dr Bero at bedside.  

## 2019-06-20 NOTE — ED Notes (Signed)
2 RN's attempted to start IV unsuccessfully. Will get RN able to attain ultrasound IV.

## 2019-06-20 NOTE — ED Notes (Signed)
Patient transported to CT 

## 2019-06-20 NOTE — ED Provider Notes (Signed)
[facility] Emergency Department Provider Note MRN:  [id number]  Arrival date & time: 06/20/19     Chief Complaint   Numbness   History of Present Illness   Kerry [name] is a 78 y.o. year-old female with a history of hypertension, hyperlipidemia presenting to the ED with chief complaint of numbness.  At 555 patient experienced some sudden onset general malaise, right arm numbness, mild right leg numbness, as well as incoherent speech.  Symptoms lasting 15 minutes and then resolving.  Patient currently is back to normal, denies any lingering symptoms.  No headache, no vomiting, no chest pain, no shortness of breath, no abdominal pain, no numbness or weakness to the arms or legs currently.  No vision change.  Review of Systems  A complete 10 system review of systems was obtained and all systems are negative except as noted in the HPI and PMH.   Patient's Health History    Past Medical History:  Diagnosis Date  . Allergy   . Anemia    in her 20's  . Arthritis    not dx'd- just per pt   . Asthma    AS A CHILD  . Cancer [facility])    endometrial cancer with hysterectomy in 1987  . Cancer ([place])    melanoma on face   . Cataract    removed both eyes   . GERD (gastroesophageal reflux disease)    occ has with diet   . History of syncope   . HTN (hypertension)   . Hyperlipidemia     Past Surgical History:  Procedure Laterality Date  . ABDOMINAL HYSTERECTOMY    . ANKLE FRACTURE SURGERY    . APPENDECTOMY    . CATARACT EXTRACTION, BILATERAL    . CHOLECYSTECTOMY    . COLONOSCOPY  08/01/2007   Dr Henrene Pastor   . DENTAL SURGERY    . right leg  fracture surgery       Family History  Problem Relation Age of Onset  . Mitral valve prolapse Daughter   . Heart disease Mother   . Kidney failure Mother   . Diabetes Mother   . Cancer Mother        uterine  . Hyperlipidemia Mother   . Hypertension Mother   . Uterine cancer Mother   . Dementia Father   . Diabetes  Sister   . Diabetes Paternal Grandmother   . Throat cancer Brother   . Colon cancer Neg Hx   . Colon polyps Neg Hx   . Esophageal cancer Neg Hx   . Rectal cancer Neg Hx   . Stomach cancer Neg Hx     Social History   Socioeconomic History  . Marital status: Widowed    Spouse name: Not on file  . Number of children: 2  . Years of education: Not on file  . Highest education level: Not on file  Occupational History  . Not on file  Social Needs  . Financial resource strain: Not on file  . Food insecurity    Worry: Not on file    Inability: Not on file  . Transportation needs    Medical: Not on file    Non-medical: Not on file  Tobacco Use  . Smoking status: Never Smoker  . Smokeless tobacco: Never Used  Substance and Sexual Activity  . Alcohol use: No  . Drug use: No  . Sexual activity: Not Currently  Lifestyle  . Physical activity    Days per week:  Not on file    Minutes per session: Not on file  . Stress: Not on file  Relationships  . Social Herbalist on phone: Not on file    Gets together: Not on file    Attends religious service: Not on file    Active member of club or organization: Not on file    Attends meetings of clubs or organizations: Not on file    Relationship status: Not on file  . Intimate partner violence    Fear of current or ex partner: Not on file    Emotionally abused: Not on file    Physically abused: Not on file    Forced sexual activity: Not on file  Other Topics Concern  . Not on file  Social History Narrative  . Not on file     Physical Exam  Vital Signs and Nursing Notes reviewed Vitals:   06/20/19 2100 06/20/19 2130  BP: (!) 142/53 (!) 138/56  Pulse: 72 72  Resp: 15 18  Temp:    SpO2: 98% 95%    CONSTITUTIONAL: Well-appearing, NAD NEURO:  Alert and oriented x 3, normal and symmetric strength and sensation, normal coordination, normal speech. EYES:  eyes equal and reactive ENT/NECK:  no LAD, no JVD CARDIO: Regular  rate, well-perfused, normal S1 and S2 PULM:  CTAB no wheezing or rhonchi GI/GU:  normal bowel sounds, non-distended, non-tender MSK/SPINE:  No gross deformities, no edema SKIN:  no rash, atraumatic PSYCH:  Appropriate speech and behavior  Diagnostic and Interventional Summary    EKG Interpretation  Date/Time:  Thursday June 20 2019 19:32:19 EDT Ventricular Rate:  76 PR Interval:    QRS Duration: 121 QT Interval:  438 QTC Calculation: 493 R Axis:   -54 Text Interpretation:  Sinus rhythm Nonspecific IVCD with LAD Left ventricular hypertrophy Confirmed by Gerlene Fee [phone]) on 06/20/2019 9:35:05 PM      Labs Reviewed  CBC - Abnormal; Notable for the following components:      Result Value   WBC 11.2 (*)    RBC 3.85 (*)    HCT 35.4 (*)    All other components within normal limits  DIFFERENTIAL - Abnormal; Notable for the following components:   Monocytes Absolute 1.2 (*)    Eosinophils Absolute 0.6 (*)    All other components within normal limits  COMPREHENSIVE METABOLIC PANEL - Abnormal; Notable for the following components:   Sodium 130 (*)    Chloride 96 (*)    Glucose, Bld 102 (*)    All other components within normal limits  URINALYSIS, ROUTINE W REFLEX MICROSCOPIC - Abnormal; Notable for the following components:   Leukocytes,Ua TRACE (*)    All other components within normal limits  I-STAT CHEM 8, ED - Abnormal; Notable for the following components:   Sodium 131 (*)    Chloride 96 (*)    All other components within normal limits  SARS CORONAVIRUS 2 (TAT 6-24 HRS)  ETHANOL  PROTIME-INR  APTT  RAPID URINE DRUG SCREEN, HOSP PERFORMED    CT HEAD WO CONTRAST  Final Result      Medications - No data to display   Procedures Critical Care  ED Course and Medical Decision Making  I have reviewed the triage vital signs and the nursing notes.  Pertinent labs & imaging results that were available during my care of the patient were reviewed by me and  considered in my medical decision making (see below for details).  History consistent  with TIA, currently with a normal neurological exam, normal vital signs.  Will obtain CT head, labs, frequent reevaluation, anticipating admission.  Discussed with Dr. Cheral Marker of neurology who agrees with admission.  Work-up unrevealing, accepted for admission by hospitalist service.  Will need MRI and admission to [facility] for TIA work-up.    Barth Kirks. [place] Small, [place] [email or web address]  Final Clinical Impressions(s) / ED Diagnoses     ICD-10-CM   1. TIA (transient ischemic attack)  G45.9     ED Discharge Orders    None      Discharge Instructions Discussed with and Provided to Patient: Discharge Instructions   None       Maudie Flakes, MD 06/20/19 2136

## 2019-06-20 NOTE — ED Notes (Signed)
Patient transported to MRI 

## 2019-06-21 ENCOUNTER — Observation Stay (HOSPITAL_COMMUNITY): Payer: Medicare Other

## 2019-06-21 ENCOUNTER — Encounter (HOSPITAL_COMMUNITY): Payer: Self-pay | Admitting: Family Medicine

## 2019-06-21 ENCOUNTER — Observation Stay (HOSPITAL_BASED_OUTPATIENT_CLINIC_OR_DEPARTMENT_OTHER): Payer: Medicare Other

## 2019-06-21 ENCOUNTER — Ambulatory Visit (HOSPITAL_BASED_OUTPATIENT_CLINIC_OR_DEPARTMENT_OTHER): Payer: Medicare Other

## 2019-06-21 DIAGNOSIS — E871 Hypo-osmolality and hyponatremia: Secondary | ICD-10-CM | POA: Diagnosis not present

## 2019-06-21 DIAGNOSIS — K219 Gastro-esophageal reflux disease without esophagitis: Secondary | ICD-10-CM | POA: Diagnosis not present

## 2019-06-21 DIAGNOSIS — Z79899 Other long term (current) drug therapy: Secondary | ICD-10-CM | POA: Diagnosis not present

## 2019-06-21 DIAGNOSIS — J4520 Mild intermittent asthma, uncomplicated: Secondary | ICD-10-CM | POA: Diagnosis not present

## 2019-06-21 DIAGNOSIS — I110 Hypertensive heart disease with heart failure: Secondary | ICD-10-CM | POA: Diagnosis not present

## 2019-06-21 DIAGNOSIS — J0100 Acute maxillary sinusitis, unspecified: Secondary | ICD-10-CM | POA: Diagnosis not present

## 2019-06-21 DIAGNOSIS — Z7982 Long term (current) use of aspirin: Secondary | ICD-10-CM | POA: Diagnosis not present

## 2019-06-21 DIAGNOSIS — G459 Transient cerebral ischemic attack, unspecified: Secondary | ICD-10-CM | POA: Diagnosis not present

## 2019-06-21 DIAGNOSIS — I6389 Other cerebral infarction: Secondary | ICD-10-CM | POA: Diagnosis not present

## 2019-06-21 DIAGNOSIS — Z8542 Personal history of malignant neoplasm of other parts of uterus: Secondary | ICD-10-CM | POA: Diagnosis not present

## 2019-06-21 DIAGNOSIS — I6782 Cerebral ischemia: Secondary | ICD-10-CM | POA: Diagnosis not present

## 2019-06-21 DIAGNOSIS — I5022 Chronic systolic (congestive) heart failure: Secondary | ICD-10-CM | POA: Diagnosis not present

## 2019-06-21 DIAGNOSIS — Z85820 Personal history of malignant melanoma of skin: Secondary | ICD-10-CM | POA: Diagnosis not present

## 2019-06-21 DIAGNOSIS — J45909 Unspecified asthma, uncomplicated: Secondary | ICD-10-CM | POA: Diagnosis not present

## 2019-06-21 DIAGNOSIS — E785 Hyperlipidemia, unspecified: Secondary | ICD-10-CM

## 2019-06-21 DIAGNOSIS — G43909 Migraine, unspecified, not intractable, without status migrainosus: Secondary | ICD-10-CM | POA: Diagnosis not present

## 2019-06-21 DIAGNOSIS — I351 Nonrheumatic aortic (valve) insufficiency: Secondary | ICD-10-CM | POA: Diagnosis not present

## 2019-06-21 DIAGNOSIS — I639 Cerebral infarction, unspecified: Secondary | ICD-10-CM | POA: Diagnosis not present

## 2019-06-21 DIAGNOSIS — M1990 Unspecified osteoarthritis, unspecified site: Secondary | ICD-10-CM | POA: Diagnosis not present

## 2019-06-21 DIAGNOSIS — R2681 Unsteadiness on feet: Secondary | ICD-10-CM | POA: Diagnosis not present

## 2019-06-21 DIAGNOSIS — Z20828 Contact with and (suspected) exposure to other viral communicable diseases: Secondary | ICD-10-CM | POA: Diagnosis not present

## 2019-06-21 DIAGNOSIS — I10 Essential (primary) hypertension: Secondary | ICD-10-CM | POA: Diagnosis not present

## 2019-06-21 LAB — HEMOGLOBIN A1C
Hgb A1c MFr Bld: 5.6 % (ref 4.8–5.6)
Mean Plasma Glucose: 114.02 mg/dL

## 2019-06-21 LAB — ECHOCARDIOGRAM COMPLETE

## 2019-06-21 LAB — BASIC METABOLIC PANEL
Anion gap: 11 (ref 5–15)
BUN: 11 mg/dL (ref 8–23)
CO2: 23 mmol/L (ref 22–32)
Calcium: 9.3 mg/dL (ref 8.9–10.3)
Chloride: 102 mmol/L (ref 98–111)
Creatinine, Ser: 0.73 mg/dL (ref 0.44–1.00)
GFR calc Af Amer: >60 mL/min (ref >60)
GFR calc non Af Amer: >60 mL/min (ref >60)
Glucose, Bld: 115 mg/dL — ABNORMAL HIGH (ref 70–99)
Potassium: 3.9 mmol/L (ref 3.5–5.1)
Sodium: 136 mmol/L (ref 135–145)

## 2019-06-21 LAB — SARS CORONAVIRUS 2 (TAT 6-24 HRS): SARS Coronavirus 2: NEGATIVE

## 2019-06-21 LAB — LIPID PANEL
Cholesterol: 163 mg/dL (ref 0–200)
HDL: 55 mg/dL (ref >40)
LDL Cholesterol: 91 mg/dL (ref 0–99)
Total CHOL/HDL Ratio: 3 RATIO
Triglycerides: 83 mg/dL (ref <150)
VLDL: 17 mg/dL (ref 0–40)

## 2019-06-21 MED ORDER — MONTELUKAST SODIUM 10 MG PO TABS: 10.0000 mg | ORAL_TABLET | Freq: Every day | ORAL | Status: DC

## 2019-06-21 MED ORDER — EZETIMIBE 10 MG PO TABS
10.0000 mg | ORAL_TABLET | Freq: Every day | ORAL | Status: DC
  Administered 2019-06-21: 10 mg via ORAL
  Filled 2019-06-21: qty 1

## 2019-06-21 MED ORDER — ACETAMINOPHEN 160 MG/5ML PO SOLN: 650.0000 mg | ORAL | Status: DC | PRN

## 2019-06-21 MED ORDER — CLOPIDOGREL BISULFATE 75 MG PO TABS
75.0000 mg | ORAL_TABLET | Freq: Every day | ORAL | Status: DC
  Administered 2019-06-21: 75 mg via ORAL
  Filled 2019-06-21 (×2): qty 1

## 2019-06-21 MED ORDER — CLOPIDOGREL BISULFATE 75 MG PO TABS: 75.0000 mg | ORAL_TABLET | Freq: Every day | ORAL | 0 refills | Status: AC

## 2019-06-21 MED ORDER — ENOXAPARIN SODIUM 40 MG/0.4ML ~~LOC~~ SOLN
40.0000 mg | SUBCUTANEOUS | Status: DC
  Administered 2019-06-21: 40 mg via SUBCUTANEOUS
  Filled 2019-06-21 (×2): qty 0.4

## 2019-06-21 MED ORDER — METOPROLOL SUCCINATE ER 25 MG PO TB24
25.0000 mg | ORAL_TABLET | Freq: Two times a day (BID) | ORAL | Status: DC
  Administered 2019-06-21: 25 mg via ORAL
  Filled 2019-06-21 (×2): qty 1

## 2019-06-21 MED ORDER — LORAZEPAM 2 MG/ML IJ SOLN
1.0000 mg | Freq: Once | INTRAMUSCULAR | Status: AC
  Administered 2019-06-21: 1 mg via INTRAVENOUS
  Filled 2019-06-21: qty 1

## 2019-06-21 MED ORDER — ACETAMINOPHEN 325 MG PO TABS: 650.0000 mg | ORAL_TABLET | ORAL | Status: DC | PRN

## 2019-06-21 MED ORDER — ASPIRIN 81 MG PO CHEW
81.0000 mg | CHEWABLE_TABLET | Freq: Every day | ORAL | Status: DC
  Administered 2019-06-21: 10:00:00 81 mg via ORAL
  Filled 2019-06-21: qty 1

## 2019-06-21 MED ORDER — SODIUM CHLORIDE 0.9 % IV SOLN
INTRAVENOUS | Status: AC
  Administered 2019-06-21: 06:00:00 via INTRAVENOUS

## 2019-06-21 MED ORDER — STROKE: EARLY STAGES OF RECOVERY BOOK
Freq: Once | Status: AC
  Administered 2019-06-21: 10:00:00
  Filled 2019-06-21 (×2): qty 1

## 2019-06-21 MED ORDER — ALBUTEROL SULFATE HFA 108 (90 BASE) MCG/ACT IN AERS
2.0000 | INHALATION_SPRAY | Freq: Four times a day (QID) | RESPIRATORY_TRACT | Status: DC | PRN
  Filled 2019-06-21: qty 6.7

## 2019-06-21 MED ORDER — EZETIMIBE 10 MG PO TABS: 10.0000 mg | ORAL_TABLET | Freq: Every day | ORAL | 0 refills | Status: DC

## 2019-06-21 MED ORDER — SENNOSIDES-DOCUSATE SODIUM 8.6-50 MG PO TABS: 1.0000 | ORAL_TABLET | Freq: Every evening | ORAL | Status: DC | PRN

## 2019-06-21 MED ORDER — LISINOPRIL 20 MG PO TABS
20.0000 mg | ORAL_TABLET | Freq: Every day | ORAL | Status: DC
  Administered 2019-06-21: 20 mg via ORAL
  Filled 2019-06-21: qty 1

## 2019-06-21 MED ORDER — ACETAMINOPHEN 650 MG RE SUPP: 650.0000 mg | RECTAL | Status: DC | PRN

## 2019-06-21 MED ORDER — CLOPIDOGREL BISULFATE 75 MG PO TABS: 75.0000 mg | ORAL_TABLET | Freq: Every day | ORAL | 0 refills | Status: DC

## 2019-06-21 MED ORDER — ALBUTEROL SULFATE (2.5 MG/3ML) 0.083% IN NEBU: 2.5000 mg | INHALATION_SOLUTION | Freq: Four times a day (QID) | RESPIRATORY_TRACT | Status: DC | PRN

## 2019-06-21 NOTE — ED Notes (Signed)
ED TO INPATIENT HANDOFF REPORT  ED Nurse Name and Phone #:  Brittany Osier [phone]  S Name/Age/Gender Kerry [name] 78 y.o. female Room/Bed: WA23/WA23  Code Status   Code Status: Full Code  Home/SNF/Other Home Patient oriented to: self, place, time and situation Is this baseline? Yes   Triage Complete: Triage complete  Chief Complaint onset right side numbness/confusion  Triage Note Patient reports at approximately 1800 she had numbness to right side and aphasia that lasted approximately fifteen minutes. Reports all symptoms have resolved at this time. Denies headache and blurred vision. Denies taking blood thinners.    Allergies Allergies  Allergen Reactions  . Lovastatin Other (See Comments)    myalgias  . Sudafed [Pseudoephedrine Hcl] Other (See Comments)    Hallucinations     Level of Care/Admitting Diagnosis ED Disposition    ED Disposition Condition Comment   Admit  Hospital Area: [place] [[id number]]  Level of Care: Telemetry Medical [104]  I expect the patient will be discharged within 24 hours: Yes  LOW acuity---Tx typically complete <24 hrs---ACUTE conditions typically can be evaluated <24 hours---LABS likely to return to acceptable levels <24 hours---IS near functional baseline---EXPECTED to return to current living arrangement---NOT newly hypoxic: Meets criteria for 5C-Observation unit  Covid Evaluation: Asymptomatic Screening Protocol (No Symptoms)  Diagnosis: TIA (transient ischemic [id number]  Admitting Physician: Shela Leff [id number]  Attending Physician: Shela Leff [id number]  PT Class (Do Not Modify): Observation [104]  PT Acc Code (Do Not Modify): Observation [10022]       B Medical/Surgery History Past Medical History:  Diagnosis Date  . Allergy   . Anemia    in her 20's  . Arthritis    not dx'd- just per pt   . Asthma    AS A CHILD  . Cancer [facility])    endometrial cancer with hysterectomy in 1987  .  Cancer ([place])    melanoma on face   . Cataract    removed both eyes   . GERD (gastroesophageal reflux disease)    occ has with diet   . History of syncope   . HTN (hypertension)   . Hyperlipidemia    Past Surgical History:  Procedure Laterality Date  . ABDOMINAL HYSTERECTOMY    . ANKLE FRACTURE SURGERY    . APPENDECTOMY    . CATARACT EXTRACTION, BILATERAL    . CHOLECYSTECTOMY    . COLONOSCOPY  08/01/2007   Dr Henrene Pastor   . DENTAL SURGERY    . right leg  fracture surgery        A IV Location/Drains/Wounds Patient Lines/Drains/Airways Status   Active Line/Drains/Airways    Name:   Placement date:   Placement time:   Site:   Days:   Peripheral IV 06/20/19 Left Antecubital   06/20/19    2040    Antecubital   1          Intake/Output Last 24 hours No intake or output data in the 24 hours ending 06/21/19 0754  Labs/Imaging Results for orders placed or performed during the hospital encounter of 06/20/19 (from the past 48 hour(s))  Ethanol     Status: None   Collection Time: 06/20/19  7:00 PM  Result Value Ref Range   Alcohol, Ethyl (B) <10 <10 mg/dL    Comment: (NOTE) Lowest detectable limit for serum alcohol is 10 mg/dL. For medical purposes only. Performed at [facility] [place] [address], [place], [place] [address]   Protime-INR  Status: None   Collection Time: 06/20/19  7:00 PM  Result Value Ref Range   Prothrombin Time 12.4 11.4 - 15.2 seconds   INR 0.9 0.8 - 1.2    Comment: (NOTE) INR goal varies based on device and disease states. Performed at [facility], [place] [address] [place], [place] [address]   APTT     Status: None   Collection Time: 06/20/19  7:00 PM  Result Value Ref Range   aPTT 30 24 - 36 seconds    Comment: Performed at [facility], [place] [address] [place], [place] [address]  CBC     Status: Abnormal   Collection Time: 06/20/19  7:00 PM  Result Value Ref Range   WBC 11.2 (H) 4.0 -  10.5 K/uL   RBC 3.85 (L) 3.87 - 5.11 MIL/uL   Hemoglobin 12.0 12.0 - 15.0 g/dL   HCT 35.4 (L) 36.0 - 46.0 %   MCV 91.9 80.0 - 100.0 fL   MCH 31.2 26.0 - 34.0 pg   MCHC 33.9 30.0 - 36.0 g/dL   RDW 12.2 11.5 - 15.5 %   Platelets 217 150 - 400 K/uL   nRBC 0.0 0.0 - 0.2 %    Comment: Performed at [facility], [place] [address], [place], [place] [address]  Differential     Status: Abnormal   Collection Time: 06/20/19  7:00 PM  Result Value Ref Range   Neutrophils Relative % 69 %   Neutro Abs 7.7 1.7 - 7.7 K/uL   Lymphocytes Relative 14 %   Lymphs Abs 1.6 0.7 - 4.0 K/uL   Monocytes Relative 11 %   Monocytes Absolute 1.2 (H) 0.1 - 1.0 K/uL   Eosinophils Relative 5 %   Eosinophils Absolute 0.6 (H) 0.0 - 0.5 K/uL   Basophils Relative 1 %   Basophils Absolute 0.1 0.0 - 0.1 K/uL   Immature Granulocytes 0 %   Abs Immature Granulocytes 0.04 0.00 - 0.07 K/uL    Comment: Performed at [facility], [place] [address], [place], [place] [address]  Comprehensive metabolic panel     Status: Abnormal   Collection Time: 06/20/19  7:00 PM  Result Value Ref Range   Sodium 130 (L) 135 - 145 mmol/L   Potassium 4.0 3.5 - 5.1 mmol/L   Chloride 96 (L) 98 - 111 mmol/L   CO2 22 22 - 32 mmol/L   Glucose, Bld 102 (H) 70 - 99 mg/dL   BUN 19 8 - 23 mg/dL   Creatinine, Ser 0.81 0.44 - 1.00 mg/dL   Calcium 9.1 8.9 - 10.3 mg/dL   Total Protein 7.9 6.5 - 8.1 g/dL   Albumin 4.0 3.5 - 5.0 g/dL   AST 30 15 - 41 U/L   ALT 21 0 - 44 U/L   Alkaline Phosphatase 106 38 - 126 U/L   Total Bilirubin 0.5 0.3 - 1.2 mg/dL   GFR calc non Af Amer >60 >60 mL/min   GFR calc Af Amer >60 >60 mL/min   Anion gap 12 5 - 15    Comment: Performed at [facility], [place] [address] [place], [place] [address]  Urine rapid drug screen (hosp performed)     Status: None   Collection Time: 06/20/19  7:00 PM  Result Value Ref Range   Opiates NONE DETECTED NONE DETECTED   Cocaine NONE  DETECTED NONE DETECTED   Benzodiazepines NONE DETECTED NONE DETECTED   Amphetamines NONE DETECTED NONE  DETECTED   Tetrahydrocannabinol NONE DETECTED NONE DETECTED   Barbiturates NONE DETECTED NONE DETECTED    Comment: (NOTE) DRUG SCREEN FOR MEDICAL PURPOSES ONLY.  IF CONFIRMATION IS NEEDED FOR ANY PURPOSE, NOTIFY LAB WITHIN 5 DAYS. LOWEST DETECTABLE LIMITS FOR URINE DRUG SCREEN Drug Class                     Cutoff (ng/mL) Amphetamine and metabolites    1000 Barbiturate and metabolites    200 Benzodiazepine                 [id number] Tricyclics and metabolites     300 Opiates and metabolites        300 Cocaine and metabolites        300 THC                            50 Performed at [facility], [place] [address], [place], [place] [address]   Urinalysis, Routine w reflex microscopic     Status: Abnormal   Collection Time: 06/20/19  7:00 PM  Result Value Ref Range   Color, Urine YELLOW YELLOW   APPearance CLEAR CLEAR   Specific Gravity, Urine 1.013 1.005 - 1.030   pH 5.0 5.0 - 8.0   Glucose, UA NEGATIVE NEGATIVE mg/dL   Hgb urine dipstick NEGATIVE NEGATIVE   Bilirubin Urine NEGATIVE NEGATIVE   Ketones, ur NEGATIVE NEGATIVE mg/dL   Protein, ur NEGATIVE NEGATIVE mg/dL   Nitrite NEGATIVE NEGATIVE   Leukocytes,Ua TRACE (A) NEGATIVE   RBC / HPF 0-5 0 - 5 RBC/hpf   WBC, UA 0-5 0 - 5 WBC/hpf   Bacteria, UA NONE SEEN NONE SEEN   Squamous Epithelial / LPF 0-5 0 - 5   Mucus PRESENT     Comment: Performed at [facility], [place] [address], [place], [place] [address]  I-stat chem 8, ED     Status: Abnormal   Collection Time: 06/20/19  7:47 PM  Result Value Ref Range   Sodium 131 (L) 135 - 145 mmol/L   Potassium 3.9 3.5 - 5.1 mmol/L   Chloride 96 (L) 98 - 111 mmol/L   BUN 19 8 - 23 mg/dL   Creatinine, Ser 0.80 0.44 - 1.00 mg/dL   Glucose, Bld 99 70 - 99 mg/dL   Calcium, Ion 1.15 1.15 - 1.40 mmol/L   TCO2 23 22 - 32 mmol/L   Hemoglobin 12.2 12.0 -  15.0 g/dL   HCT 36.0 36.0 - 46.0 %  SARS CORONAVIRUS 2 (TAT 6-24 HRS) Nasopharyngeal Nasopharyngeal Swab     Status: None   Collection Time: 06/20/19  8:42 PM   Specimen: Nasopharyngeal Swab  Result Value Ref Range   SARS Coronavirus 2 NEGATIVE NEGATIVE    Comment: (NOTE) SARS-CoV-2 target nucleic acids are NOT DETECTED. The SARS-CoV-2 RNA is generally detectable in upper and lower respiratory specimens during the acute phase of infection. Negative results do not preclude SARS-CoV-2 infection, do not rule out co-infections with other pathogens, and should not be used as the sole basis for treatment or other patient management decisions. Negative results must be combined with clinical observations, patient history, and epidemiological information. The expected result is Negative. Fact Sheet for Patients: SugarRoll.be Fact Sheet for Healthcare Providers: https://www.woods-mathews.com/ This test is not yet approved or cleared by the [place] FDA and  has been authorized for detection and/or diagnosis of SARS-CoV-2 by FDA under an  Emergency Use Authorization (EUA). This EUA will remain  in effect (meaning this test can be used) for the duration of the COVID-19 declaration under Section 56 4(b)(1) of the Act, 21 U.S.C. section 360bbb-3(b)(1), unless the authorization is terminated or revoked sooner. Performed at [facility], [place] [address] [place], [place] [address]   Lipid panel     Status: None   Collection Time: 06/21/19  5:31 AM  Result Value Ref Range   Cholesterol 163 0 - 200 mg/dL   Triglycerides 83 <150 mg/dL   HDL 55 >40 mg/dL   Total CHOL/HDL Ratio 3.0 RATIO   VLDL 17 0 - 40 mg/dL   LDL Cholesterol 91 0 - 99 mg/dL    Comment:        Total Cholesterol/HDL:CHD Risk Coronary Heart Disease Risk Table                     Men   Women  1/2 Average Risk   3.4   3.3  Average Risk       5.0   4.4  2 X Average Risk   9.6    7.1  3 X Average Risk  23.4   11.0        Use the calculated Patient Ratio above and the CHD Risk Table to determine the patient's CHD Risk.        ATP III CLASSIFICATION (LDL):  <100     mg/dL   Optimal  100-129  mg/dL   Near or Above                    Optimal  130-159  mg/dL   Borderline  160-189  mg/dL   High  >190     mg/dL   Very High Performed at [place] [address], [place], [place] [address]    Ct Head Wo Contrast  Result Date: 06/20/2019 CLINICAL DATA:  Right-sided numbness and aphasia for approximately 15 minutes earlier this evening. EXAM: CT HEAD WITHOUT CONTRAST TECHNIQUE: Contiguous axial images were obtained from the base of the skull through the vertex without intravenous contrast. COMPARISON:  Brain MRI dated 08/29/2008. Head CT dated 08/28/2008. FINDINGS: Brain: Minimally prominent ventricles and subarachnoid spaces with minimal progression. No intracranial hemorrhage, mass lesion or CT evidence of acute infarction. Vascular: No hyperdense vessel or unexpected calcification. Skull: Normal. Negative for fracture or focal lesion. Sinuses/Orbits: Status post bilateral cataract extraction. Bilateral maxillary sinus air-fluid levels. Mild bilateral ethmoid sinus mucosal thickening and mild left frontal sinus mucosal thickening. Other: None. IMPRESSION: 1. No acute abnormality. 2. Minimal diffuse cerebral and cerebellar atrophy with minimal progression. 3. Acute bilateral maxillary sinusitis. Electronically Signed   By: Claudie Revering M.D.   On: 06/20/2019 20:34   Mr Brain Wo Contrast  Result Date: 06/20/2019 CLINICAL DATA:  Right-sided numbness and aphasia EXAM: MRI HEAD WITHOUT CONTRAST TECHNIQUE: Multiplanar, multiecho pulse sequences of the brain and surrounding structures were obtained without intravenous contrast. COMPARISON:  None. FINDINGS: BRAIN: There is no acute infarct, acute hemorrhage or extra-axial collection. Multifocal white matter  hyperintensity, most commonly due to chronic ischemic microangiopathy. The cerebral and cerebellar volume are age-appropriate. There is no hydrocephalus. The midline structures are normal. VASCULAR: The major intracranial arterial and venous sinus flow voids are normal. Susceptibility-sensitive sequences show no chronic microhemorrhage or superficial siderosis. SKULL AND UPPER CERVICAL SPINE: Hypertrophy at the atlantoaxial joint with mild narrowing of foramen magnum. SINUSES/ORBITS: Mild maxillary sinus  mucosal thickening. No mastoid or middle ear effusion. The orbits are normal. IMPRESSION: 1. No acute intracranial abnormality. 2. Mild chronic small vessel disease. Electronically Signed   By: Ulyses Jarred M.D.   On: 06/20/2019 22:56    Pending Labs Unresulted Labs (From admission, onward)    Start     Ordered   06/28/19 0500  Creatinine, serum  (enoxaparin (LOVENOX)    CrCl >/= 30 ml/min)  Weekly,   R    Comments: while on enoxaparin therapy    06/21/19 0454   06/21/19 0500  Hemoglobin A1c  Tomorrow morning,   R     06/21/19 0454          Vitals/Pain Today's Vitals   06/21/19 0530 06/21/19 0600 06/21/19 0700 06/21/19 0730  BP: (!) 145/57 (!) 132/46 (!) 127/50 130/79  Pulse: 69 66 69 80  Resp: (!) 21 13 18 14   Temp:      TempSrc:      SpO2: 95% 95% 93% 96%  PainSc:        Isolation Precautions No active isolations  Medications Medications  aspirin chewable tablet 81 mg (has no administration in time range)  lisinopril (ZESTRIL) tablet 20 mg (has no administration in time range)  metoprolol succinate (TOPROL-XL) 24 hr tablet 25 mg (has no administration in time range)  albuterol (VENTOLIN HFA) 108 (90 Base) MCG/ACT inhaler 2 puff (has no administration in time range)  montelukast (SINGULAIR) tablet 10 mg (has no administration in time range)   stroke: mapping our early stages of recovery book (has no administration in time range)  0.9 %  sodium chloride infusion ( Intravenous  New Bag/Given 06/21/19 0530)  acetaminophen (TYLENOL) tablet 650 mg (has no administration in time range)    Or  acetaminophen (TYLENOL) solution 650 mg (has no administration in time range)    Or  acetaminophen (TYLENOL) suppository 650 mg (has no administration in time range)  senna-docusate (Senokot-S) tablet 1 tablet (has no administration in time range)  enoxaparin (LOVENOX) injection 40 mg (has no administration in time range)    Mobility walks Low fall risk   Focused Assessments    R Recommendations: See Admitting Provider Note  Report given to:  Woody   Additional Notes:

## 2019-06-21 NOTE — ED Notes (Signed)
Pt ambulatory to restroom without any concerns

## 2019-06-21 NOTE — Evaluation (Signed)
Occupational Therapy Evaluation Patient Details Name: Kerry [name] MRN: [id number] DOB: Nov 07, 1940 Today's Date: 06/21/2019    History of Present Illness 78 y.o. female with medical history significant for hypertension, asthma, migraines, and hyperlipidemia with reported statin intolerance, now presenting to emergency department after an episode of right-sided numbness and speech difficulty   Clinical Impression   Pt PTA: living alone, working - supportive daughter a few miles away. Pt was independent prior. Pt currently performing ADL functional mobility with no AD in room and ADL tasks with independence standing at sink and sitting at EOB. Pt able to transfer on/off various heights of surfaces with no physical assist. Pt with no focal deficits identified. Pt does not require continued OT skilled services as pt is at her functional baseline. OT signing off.    Follow Up Recommendations  No OT follow up    Equipment Recommendations  None recommended by OT    Recommendations for Other Services       Precautions / Restrictions Precautions Precautions: None Restrictions Weight Bearing Restrictions: No      Mobility Bed Mobility Overal bed mobility: Independent                Transfers Overall transfer level: Independent Equipment used: None             General transfer comment: pt performed 2 sit to stands independently    Balance Overall balance assessment: Independent                                         ADL either performed or assessed with clinical judgement   ADL Overall ADL's : Independent                                             Vision Baseline Vision/History: No visual deficits Vision Assessment?: No apparent visual deficits     Perception     Praxis      Pertinent Vitals/Pain Pain Assessment: No/denies pain     Hand Dominance Right   Extremity/Trunk Assessment Upper Extremity  Assessment Upper Extremity Assessment: Overall WFL for tasks assessed   Lower Extremity Assessment Lower Extremity Assessment: Overall WFL for tasks assessed   Cervical / Trunk Assessment Cervical / Trunk Assessment: Normal   Communication Communication Communication: No difficulties   Cognition Arousal/Alertness: Awake/alert Behavior During Therapy: WFL for tasks assessed/performed Overall Cognitive Status: Within Functional Limits for tasks assessed                                     General Comments       Exercises     Shoulder Instructions      Home Living Family/patient expects to be discharged to:: Private residence Living Arrangements: Alone Available Help at Discharge: (not determined) Type of Home: House Home Access: Stairs to enter [employer] of Steps: 2 Entrance Stairs-Rails: Can reach both Home Layout: One level     Bathroom Shower/Tub: Teacher, early years/pre: Standard     Home Equipment: None          Prior Functioning/Environment Level of Independence: Independent        Comments: pt walking 1-3 miles per day;  pt reports having neighbors who check on her and she helps as well. Daughter lives 2 miles away        OT Problem List:        OT Treatment/Interventions:      OT Goals(Current goals can be found in the care plan section)    OT Frequency:     Barriers to D/C:            Co-evaluation              AM-PAC OT "6 Clicks" Daily Activity     Outcome Measure Help from another person eating meals?: None Help from another person taking care of personal grooming?: None Help from another person toileting, which includes using toliet, bedpan, or urinal?: None Help from another person bathing (including washing, rinsing, drying)?: None Help from another person to put on and taking off regular upper body clothing?: None Help from another person to put on and taking off regular lower body  clothing?: None 6 Click Score: 24   End of Session Nurse Communication: Mobility status  Activity Tolerance: Patient tolerated treatment well Patient left: in bed;with nursing/sitter in room;with call bell/phone within reach  OT Visit Diagnosis: Unsteadiness on feet (R26.81)                Time: [id number] OT Time Calculation (min): 20 min Charges:  OT General Charges $OT Visit: 1 Visit OT Evaluation $OT Eval Moderate Complexity: 1 Mod  Darryl Nestle) Marsa Aris OTR/L Acute Rehabilitation Services Pager: [phone] Office: [place] 06/21/2019, 3:20 PM

## 2019-06-21 NOTE — Discharge Summary (Signed)
Physician Discharge Summary  CAMALA BONIFAS [id number] DOB: 1941/08/08 DOA: 06/20/2019  PCP: Forrest Moron, MD  Admit date: 06/20/2019 Discharge date: 06/21/2019  Admitted From: Home Disposition: Home  Recommendations for Outpatient Follow-up:  1. Follow up with PCP in 1-2 weeks 2. Cardiology to arrange outpatient follow-up for event monitor and heart failure. 3. Please obtain CBC/BMP/Mag at follow up 4. Please follow up on the following pending results: Final result on lower extremity Doppler and carotid Doppler.  Home Health: None Equipment/Devices: None  Discharge Condition: Stable CODE STATUS: Full code  Hospital Course: 78 year old female with history of HTN, asthma, migraines, HLD/statin intolerance presenting with brief episode of right-sided paresthesia and speech change (slurring and difficulty word finding).  Paresthesia lasted about 15 minutes.  Speech change lasted "a little bit longer".  Both symptoms resolved upon arrival to ED.  Patient has not had further neuro symptoms.  CVA work-up including CT head without contrast, MRI/MRA brain, carotid Dopplers and lower extremity venous Dopplers without significant finding.  A1c 5.6%.  LDL 91.  Echocardiogram with mildly reduced EF of 45 to [id number] and diastolic dysfunction but no other major finding.  Evaluated by neurology who recommended DAPT (Plavix and aspirin) for 3 weeks, the ASA 81 mg indefinitely.  Neurology consulted cardiology for loop recorder.  After meeting with patient, patient declined loop recorder in favor of event monitor at least for now.  Cardiology to arrange outpatient follow-up for new CHF as well.   Patient was evaluated and cleared by PT/OT/SLP  All findings and recommendations discussed with patient prior to discharge.  See individual problem list below for more on hospital course.  Discharge Diagnoses:  Right-sided paresthesia/slurring/aphasia: resolved prior to arrival to ED.  Neuro exam was  normal.  CVA work-up without significant finding. -Discharged on Plavix and aspirin for 3 week, then low-dose aspirin per neuro recommendation -Cardiology to arrange event monitor +/-Loop outpatient. -Discharged on Zetia-history of statin intolerance.  New combined systolic/diastolic CHF: Echo with EF of 45 to [id number] and some diastolic dysfunction.  Patient appears euvolemic without cardiopulmonary symptoms other than her chronic dyspnea due to underlying asthma. -Cardiology to arrange outpatient follow-up -Already on metoprolol and lisinopril. -Discussed sodium and fluid restrictions.  Essential hypertension: Normotensive -Discharged on home medications.  Mild intermittent asthma: Stable. -Discharged on home medications.  Hyponatremia: Likely due to thiazide.  Improved. -Recheck BMP at follow-up -May consider changing to Lasix  History of migraine: Stable  Discharge Instructions  Discharge Instructions    (HEART FAILURE PATIENTS) Call MD:  Anytime you have any of the following symptoms: 1) 3 pound weight gain in 24 hours or 5 pounds in 1 week 2) shortness of breath, with or without a dry hacking cough 3) swelling in the hands, feet or stomach 4) if you have to sleep on extra pillows at night in order to breathe.   Complete by: As directed    Ambulatory referral to Neurology   Complete by: As directed    Follow up with stroke clinic NP (Jessica Vanschaick or Cecille Rubin, if both not available, consider Zachery Dauer, or Ahern) at [facility] in about 4 weeks. Thanks.   Diet - low sodium heart healthy   Complete by: As directed    Discharge instructions   Complete by: As directed    It has been a pleasure taking care of you! You were admitted with right-sided numbness and difficulty speaking.  Your symptoms resolved.  It is unclear what caused your symptoms.  Your tests did not reveal a stroke or other significant finding to suggest the cause.  However, your echocardiogram shows mild  heart failure (weakness and stiffness).  The cardiologist office will call you early next week to arrange outpatient follow-up for your heart failure and heart monitor.  At this point, we believe it is safe to let you go home and follow-up with your primary care doctor. Please call your primary care office as soon as possible to schedule hospital follow-up visit in 1 to 2 weeks. Please seek immediate care if your symptoms recur or you have other symptoms concerning to you.  Take care,   Increase activity slowly   Complete by: As directed      Allergies as of 06/21/2019      Reactions   Lovastatin Other (See Comments)   myalgias   Sudafed [pseudoephedrine Hcl] Other (See Comments)   Hallucinations      Medication List    TAKE these medications   acetaminophen 500 MG tablet Commonly known as: TYLENOL Take 500 mg by mouth every 6 (six) hours as needed for moderate pain or headache.   albuterol 108 (90 Base) MCG/ACT inhaler Commonly known as: VENTOLIN HFA Inhale 2 puffs into the lungs every 6 (six) hours as needed for wheezing or shortness of breath.   aspirin 81 MG chewable tablet Chew 81 mg by mouth daily. Every other day per pt   clopidogrel 75 MG tablet Commonly known as: Plavix Take 1 tablet (75 mg total) by mouth daily.   CO Q 10 PO Take 1 capsule by mouth daily.   ezetimibe 10 MG tablet Commonly known as: ZETIA Take 1 tablet (10 mg total) by mouth daily. Start taking on: June 22, 2019   fexofenadine 180 MG tablet Commonly known as: ALLEGRA Take 180 mg by mouth daily.   hydrochlorothiazide 25 MG tablet Commonly known as: HYDRODIURIL TAKE 1 TABLET(25 MG) BY MOUTH DAILY What changed: See the new instructions.   lisinopril 20 MG tablet Commonly known as: ZESTRIL TAKE 1 TABLET BY MOUTH EVERY DAY   metoprolol succinate 25 MG 24 hr tablet Commonly known as: TOPROL-XL TAKE 1 TABLET BY MOUTH TWICE DAILY What changed: additional instructions   montelukast 10 MG  tablet Commonly known as: SINGULAIR Take 1 tablet (10 mg total) by mouth at bedtime.   multivitamin tablet Take 1 tablet by mouth daily.   VITAMIN C PO Take 1 tablet by mouth daily.   Vitamin D3 50 MCG (2000 UT) capsule Take 2,000 Units by mouth daily.      Follow-up Information    Forrest Moron, MD. Schedule an appointment as soon as possible for a visit in 1 week(s).   Specialty: Internal Medicine Contact information: [place] [address] [phone]        Shirley Friar, PA-C Follow up on 08/06/2019.   Specialty: Physician Assistant Why: at 1005 to discuss event monitor results and possible loop recorder implantation.  Contact information: [address] [place] [address] [phone]        [facility]. Schedule an appointment as soon as possible for a visit in 4 week(s).   Specialty: Neurology Contact information: [address] [place] [phone]          Consultations:  Neurology  Cardiology  Procedures/Studies:  2D Echo:  1. Left ventricular ejection fraction, by visual estimation, is 45 to 50%. The left ventricle has mildly decreased function.  Mildly increased left ventricular size. There is no left ventricular hypertrophy.  2. Left ventricular diastolic Doppler parameters are consistent with impaired relaxation pattern of LV diastolic filling.  3. Global right ventricle has normal systolic function.The right ventricular size is mildly enlarged. No increase in right ventricular wall thickness.  4. Left atrial size was mildly dilated.  5. Right atrial size was mildly dilated.  6. The pericardial effusion is anterior to the right ventricle.  7. Trivial pericardial effusion is present.  8. The mitral valve is normal in structure. No evidence of mitral valve regurgitation. No evidence of mitral stenosis.  9. The tricuspid valve is normal in structure.  Tricuspid valve regurgitation is trivial. 10. The aortic valve is normal in structure. Aortic valve regurgitation is mild by color flow Doppler. Mild aortic valve stenosis. 11. The pulmonic valve was normal in structure. Pulmonic valve regurgitation is not visualized by color flow Doppler. 12. Normal pulmonary artery systolic pressure. 13. The tricuspid regurgitant velocity is 2.52 m/s, and with an assumed right atrial pressure of 3 mmHg, the estimated right ventricular systolic pressure is normal at 28.4 mmHg. 14. The inferior vena cava is normal in size with greater than 50% respiratory variability, suggesting right atrial pressure of 3 mmHg. 15. No embolic source identified.  Ct Head Wo Contrast  Result Date: 06/20/2019 CLINICAL DATA:  Right-sided numbness and aphasia for approximately 15 minutes earlier this evening. EXAM: CT HEAD WITHOUT CONTRAST TECHNIQUE: Contiguous axial images were obtained from the base of the skull through the vertex without intravenous contrast. COMPARISON:  Brain MRI dated 08/29/2008. Head CT dated 08/28/2008. FINDINGS: Brain: Minimally prominent ventricles and subarachnoid spaces with minimal progression. No intracranial hemorrhage, mass lesion or CT evidence of acute infarction. Vascular: No hyperdense vessel or unexpected calcification. Skull: Normal. Negative for fracture or focal lesion. Sinuses/Orbits: Status post bilateral cataract extraction. Bilateral maxillary sinus air-fluid levels. Mild bilateral ethmoid sinus mucosal thickening and mild left frontal sinus mucosal thickening. Other: None. IMPRESSION: 1. No acute abnormality. 2. Minimal diffuse cerebral and cerebellar atrophy with minimal progression. 3. Acute bilateral maxillary sinusitis. Electronically Signed   By: Claudie Revering M.D.   On: 06/20/2019 20:34   Mr Angio Head Wo Contrast  Result Date: 06/21/2019 CLINICAL DATA:  Stroke workup. Patient presented with right-sided numbness and aphasia EXAM: MRA HEAD  WITHOUT CONTRAST TECHNIQUE: Angiographic images of the Circle of Willis were obtained using MRA technique without intravenous contrast. COMPARISON:  08/29/2018 FINDINGS: Carotid, vertebral, and basilar arteries are smooth and widely patent. Mild left A2 segment narrowing. Mild distal MCA branch narrowing versus artifact. No major branch occlusion or flow limiting stenosis. Negative for aneurysm. IMPRESSION: 1. No emergent finding. 2. Mild atheromatous changes. 3. Maxillary sinus fluid levels on both sides. Electronically Signed   By: Monte Fantasia M.D.   On: 06/21/2019 11:59   Mr Brain Wo Contrast  Result Date: 06/20/2019 CLINICAL DATA:  Right-sided numbness and aphasia EXAM: MRI HEAD WITHOUT CONTRAST TECHNIQUE: Multiplanar, multiecho pulse sequences of the brain and surrounding structures were obtained without intravenous contrast. COMPARISON:  None. FINDINGS: BRAIN: There is no acute infarct, acute hemorrhage or extra-axial collection. Multifocal white matter hyperintensity, most commonly due to chronic ischemic microangiopathy. The cerebral and cerebellar volume are age-appropriate. There is no hydrocephalus. The midline structures are normal. VASCULAR: The major intracranial arterial and venous sinus flow voids are normal. Susceptibility-sensitive sequences show no chronic microhemorrhage or superficial siderosis. SKULL AND UPPER CERVICAL SPINE: Hypertrophy at the atlantoaxial joint with mild narrowing  of foramen magnum. SINUSES/ORBITS: Mild maxillary sinus mucosal thickening. No mastoid or middle ear effusion. The orbits are normal. IMPRESSION: 1. No acute intracranial abnormality. 2. Mild chronic small vessel disease. Electronically Signed   By: Ulyses Jarred M.D.   On: 06/20/2019 22:56   Vas US Carotid (at [place] Only)  Result Date: 06/21/2019 Carotid Arterial Duplex Study Indications:       TIA. Risk Factors:      Hypertension, hyperlipidemia. Comparison Study:  previous study done 09/15/2011  Performing Technologist: Abram Sander RVS  Examination Guidelines: A complete evaluation includes B-mode imaging, spectral Doppler, color Doppler, and power Doppler as needed of all accessible portions of each vessel. Bilateral testing is considered an integral part of a complete examination. Limited examinations for reoccurring indications may be performed as noted.  Right Carotid Findings: +----------+--------+--------+--------+------------------+--------+             PSV cm/s EDV cm/s Stenosis Plaque Description Comments  +----------+--------+--------+--------+------------------+--------+  CCA Prox   100      13                heterogenous                 +----------+--------+--------+--------+------------------+--------+  CCA Distal 80       11                heterogenous                 +----------+--------+--------+--------+------------------+--------+  ICA Prox   84       10       1-39%    heterogenous                 +----------+--------+--------+--------+------------------+--------+  ICA Distal 66       11                                             +----------+--------+--------+--------+------------------+--------+  ECA        140      13                                             +----------+--------+--------+--------+------------------+--------+ +----------+--------+-------+--------+-------------------+             PSV cm/s EDV cms Describe Arm Pressure (mmHG)  +----------+--------+-------+--------+-------------------+  Subclavian 101                                            +----------+--------+-------+--------+-------------------+ +---------+--------+--+--------+--+---------+  Vertebral PSV cm/s 49 EDV cm/s 13 Antegrade  +---------+--------+--+--------+--+---------+  Left Carotid Findings: +----------+--------+--------+--------+------------------+--------+             PSV cm/s EDV cm/s Stenosis Plaque Description Comments  +----------+--------+--------+--------+------------------+--------+  CCA Prox    126      12                heterogenous                 +----------+--------+--------+--------+------------------+--------+  CCA Distal 94       17                heterogenous                 +----------+--------+--------+--------+------------------+--------+  ICA Prox   79       14       1-39%    heterogenous                 +----------+--------+--------+--------+------------------+--------+  ICA Distal 72       15                                             +----------+--------+--------+--------+------------------+--------+  ECA        100                                                     +----------+--------+--------+--------+------------------+--------+ +----------+--------+--------+--------+-------------------+             PSV cm/s EDV cm/s Describe Arm Pressure (mmHG)  +----------+--------+--------+--------+-------------------+  Subclavian 147                                             +----------+--------+--------+--------+-------------------+ +---------+--------+--+--------+-+  Vertebral PSV cm/s 48 EDV cm/s 6  +---------+--------+--+--------+-+  Summary: Right Carotid: Velocities in the right ICA are consistent with a 1-39% stenosis. Left Carotid: Velocities in the left ICA are consistent with a 1-39% stenosis. Vertebrals: Bilateral vertebral arteries demonstrate antegrade flow. *See table(s) above for measurements and observations.     Preliminary    Vas [place] Lower Extremity Venous (dvt)  Result Date: 06/21/2019  Lower Venous Study Indications: Stroke.  Comparison Study: No prior study. Performing Technologist: Maudry Mayhew MHA, RDMS, RVT, RDCS  Examination Guidelines: A complete evaluation includes B-mode imaging, spectral Doppler, color Doppler, and power Doppler as needed of all accessible portions of each vessel. Bilateral testing is considered an integral part of a complete examination. Limited examinations for reoccurring indications may be performed as noted.   +---------+---------------+---------+-----------+----------+--------------+  RIGHT     Compressibility Phasicity Spontaneity Properties Thrombus Aging  +---------+---------------+---------+-----------+----------+--------------+  CFV       Full            Yes       Yes                                    +---------+---------------+---------+-----------+----------+--------------+  SFJ       Full                                                             +---------+---------------+---------+-----------+----------+--------------+  FV Prox   Full                                                             +---------+---------------+---------+-----------+----------+--------------+  FV Mid    Full                                                             +---------+---------------+---------+-----------+----------+--------------+  FV Distal Full                                                             +---------+---------------+---------+-----------+----------+--------------+  PFV       Full                                                             +---------+---------------+---------+-----------+----------+--------------+  POP       Full            Yes       Yes                                    +---------+---------------+---------+-----------+----------+--------------+  PTV       Full                                                             +---------+---------------+---------+-----------+----------+--------------+  PERO      Full                                                             +---------+---------------+---------+-----------+----------+--------------+   +---------+---------------+---------+-----------+----------+--------------+  LEFT      Compressibility Phasicity Spontaneity Properties Thrombus Aging  +---------+---------------+---------+-----------+----------+--------------+  CFV       Full            Yes       Yes                                     +---------+---------------+---------+-----------+----------+--------------+  SFJ       Full                                                             +---------+---------------+---------+-----------+----------+--------------+  FV Prox   Full                                                             +---------+---------------+---------+-----------+----------+--------------+  FV Mid    Full                                                             +---------+---------------+---------+-----------+----------+--------------+  FV Distal Full                                                             +---------+---------------+---------+-----------+----------+--------------+  PFV       Full                                                             +---------+---------------+---------+-----------+----------+--------------+  POP       Full            Yes       Yes                                    +---------+---------------+---------+-----------+----------+--------------+  PTV       Full                                                             +---------+---------------+---------+-----------+----------+--------------+  PERO      Full                                                             +---------+---------------+---------+-----------+----------+--------------+     Summary: Right: There is no evidence of deep vein thrombosis in the lower extremity. No cystic structure found in the popliteal fossa. Left: There is no evidence of deep vein thrombosis in the lower extremity. A cystic structure is found in the popliteal fossa.  *See table(s) above for measurements and observations.    Preliminary       Subjective: No major events overnight of this morning.  No further procedure or difficulty speaking.  Had a mild headache that has resolved with coffee this morning.  Denies vision change, focal weakness, numbness or tingling.  Denies chest pain, dyspnea, nausea, vomiting, palpitation or UTI symptoms.   Denies history of blood clots, stroke or heart attack.  Reports intolerance to statin due to significant lower extremity pain.   Discharge Exam: Vitals:   06/21/19 1224 06/21/19 1605  BP: (!) 124/44 (!) 131/53  Pulse: 68 73  Resp: 16 16  Temp: 98.2 F (36.8 C) 98.5 F (36.9 C)  SpO2: 93% 95%    GENERAL: No acute distress.  Appears well.  HEENT: MMM.  Vision and hearing grossly intact.  NECK: Supple.  No JVD.  LUNGS:  No IWOB. Good air movement bilaterally. HEART:  RRR. Heart sounds normal.  ABD: Bowel sounds present. Soft. Non tender.  MSK/EXT:  Moves all extremities. No apparent deformity. No edema bilaterally.  SKIN: no apparent skin lesion or wound NEURO: Awake, alert and oriented appropriately.  Speech clear.  Cranial nerves II-XII intact, motor 5/5 in all muscle groups of  UE and LE bilaterally, normal tone, light sensation intact in all dermatomes of upper and lower ext bilaterally, no pronator drift, biceps, patellar and Achille reflexes 2+ bilaterally, finger to nose intact, gait deferred. PSYCH: Calm. Normal affect.     The results of significant diagnostics from this hospitalization (including imaging, microbiology, ancillary and laboratory) are listed below for reference.     Microbiology: Recent Results (from the past 240 hour(s))  SARS CORONAVIRUS 2 (TAT 6-24 HRS) Nasopharyngeal Nasopharyngeal Swab     Status: None   Collection Time: 06/20/19  8:42 PM   Specimen: Nasopharyngeal Swab  Result Value Ref Range Status   SARS Coronavirus 2 NEGATIVE NEGATIVE Final    Comment: (NOTE) SARS-CoV-2 target nucleic acids are NOT DETECTED. The SARS-CoV-2 RNA is generally detectable in upper and lower respiratory specimens during the acute phase of infection. Negative results do not preclude SARS-CoV-2 infection, do not rule out co-infections with other pathogens, and should not be used as the sole basis for treatment or other patient management decisions. Negative results  must be combined with clinical observations, patient history, and epidemiological information. The expected result is Negative. Fact Sheet for Patients: SugarRoll.be Fact Sheet for Healthcare Providers: https://www.woods-mathews.com/ This test is not yet approved or cleared by the [place] FDA and  has been authorized for detection and/or diagnosis of SARS-CoV-2 by FDA under an Emergency Use Authorization (EUA). This EUA will remain  in effect (meaning this test can be used) for the duration of the COVID-19 declaration under Section 56 4(b)(1) of the Act, 21 U.S.C. section 360bbb-3(b)(1), unless the authorization is terminated or revoked sooner. Performed at [facility], [place] [address], [place], [place] [address]      Labs: BNP (last 3 results) No results for input(s): BNP in the last 8760 hours. Basic Metabolic Panel: Recent Labs  Lab 06/20/19 1900 06/20/19 1947 06/21/19 1304  NA 130* 131* 136  K 4.0 3.9 3.9  CL 96* 96* 102  CO2 22  --  23  GLUCOSE 102* 99 115*  BUN 19 19 11   CREATININE 0.81 0.80 0.73  CALCIUM 9.1  --  9.3   Liver Function Tests: Recent Labs  Lab 06/20/19 1900  AST 30  ALT 21  ALKPHOS 106  BILITOT 0.5  PROT 7.9  ALBUMIN 4.0   No results for input(s): LIPASE, AMYLASE in the last 168 hours. No results for input(s): AMMONIA in the last 168 hours. CBC: Recent Labs  Lab 06/20/19 1900 06/20/19 1947  WBC 11.2*  --   NEUTROABS 7.7  --   HGB 12.0 12.2  HCT 35.4* 36.0  MCV 91.9  --   PLT 217  --    Cardiac Enzymes: No results for input(s): CKTOTAL, CKMB, CKMBINDEX, TROPONINI in the last 168 hours. BNP: Invalid input(s): POCBNP CBG: No results for input(s): GLUCAP in the last 168 hours. D-Dimer No results for input(s): DDIMER in the last 72 hours. Hgb A1c Recent Labs    06/21/19 0531  HGBA1C 5.6   Lipid Profile Recent Labs    06/21/19 0531  CHOL 163  HDL 55  LDLCALC 91  TRIG  83  CHOLHDL 3.0   Thyroid function studies No results for input(s): TSH, T4TOTAL, T3FREE, THYROIDAB in the last 72 hours.  Invalid input(s): FREET3 Anemia work up No results for input(s): VITAMINB12, FOLATE, FERRITIN, TIBC, IRON, RETICCTPCT in the last 72 hours. Urinalysis    Component Value Date/Time   COLORURINE YELLOW 06/20/2019 [place]  06/20/2019 1900   LABSPEC 1.013 06/20/2019 1900   PHURINE 5.0 06/20/2019 1900   GLUCOSEU NEGATIVE 06/20/2019 1900   HGBUR NEGATIVE 06/20/2019 1900   BILIRUBINUR NEGATIVE 06/20/2019 1900   BILIRUBINUR negative 02/22/2018 1741   KETONESUR NEGATIVE 06/20/2019 1900   PROTEINUR NEGATIVE 06/20/2019 1900   UROBILINOGEN 0.2 02/22/2018 1741   UROBILINOGEN 0.2 08/28/2008 1454   NITRITE NEGATIVE 06/20/2019 1900   LEUKOCYTESUR TRACE (A) 06/20/2019 1900   Sepsis Labs Invalid input(s): PROCALCITONIN,  WBC,  LACTICIDVEN   Time coordinating discharge: 35 minutes  SIGNED:  Mercy Riding, MD  [facility] 06/21/2019, 4:40 PM  If 7PM-7AM, please contact night-coverage [email or web address] Password [email or web address]

## 2019-06-21 NOTE — TOC Transition Note (Signed)
Transition of Care [facility]) - CM/SW Discharge Note   Patient Details  Name: Kerry [name] MRN: [id number] Date of Birth: 1940-12-23  Transition of Care [facility]) CM/SW Contact:  Pollie Friar, RN Phone Number: 06/21/2019, 2:34 PM   Clinical Narrative:    Pt discharging home with self care. Pt has PCP, insurance and transportation home.   Final next level of care: Home/Self Care Barriers to Discharge: No Barriers Identified   Patient Goals and CMS Choice        Discharge Placement                       Discharge Plan and Services                                     Social Determinants of Health (SDOH) Interventions     Readmission Risk Interventions No flowsheet data found.

## 2019-06-21 NOTE — Consult Note (Signed)
ELECTROPHYSIOLOGY CONSULT NOTE  Patient ID: Kerry [name] MRN: [id number], DOB/AGE: [birth year]   Admit date: 06/20/2019 Date of Consult: 06/21/2019  Primary Physician: Forrest Moron, MD Primary Cardiologist: No primary care provider on file.  Primary Electrophysiologist: New to None  Reason for Consultation: Cryptogenic stroke; recommendations regarding Implantable Loop Recorder  History of Present Illness EP has been asked to evaluate [facility] for placement of an implantable loop recorder to monitor for atrial fibrillation by Dr Cheral Marker.  The patient was admitted on 06/20/2019 with transient aphasia and right sided numbness.  They first developed symptoms while at home.  Imaging demonstrated no acute abnormalities with .mild chronic small vessel disease  They have undergone workup for stroke including carotid dopplers.  The patient has been monitored on telemetry which has demonstrated sinus rhythm with no arrhythmias.  Inpatient stroke work-up will not require a TEE per Neurology.   Echocardiogram 09/18/2018 and previous echoes have shown normal EF. Repeat Echo pending.  Prior to admission, the patient denies chest pain, shortness of breath, dizziness, palpitations, or syncope.  They are recovering from their TIA with plans to return home  at discharge.  Past Medical History:  Diagnosis Date   Allergy    Anemia    in her 20's   Arthritis    not dx'd- just per pt    Asthma    AS A CHILD   Cancer ([place])    endometrial cancer with hysterectomy in 1987   Cancer ([place])    melanoma on face    Cataract    removed both eyes    GERD (gastroesophageal reflux disease)    occ has with diet    History of syncope    HTN (hypertension)    Hyperlipidemia      Surgical History:  Past Surgical History:  Procedure Laterality Date   ABDOMINAL HYSTERECTOMY     ANKLE FRACTURE SURGERY     APPENDECTOMY     CATARACT EXTRACTION, BILATERAL     CHOLECYSTECTOMY      COLONOSCOPY  08/01/2007   Dr Henrene Pastor    DENTAL SURGERY     right leg  fracture surgery        Medications Prior to Admission  Medication Sig Dispense Refill Last Dose   acetaminophen (TYLENOL) 500 MG tablet Take 500 mg by mouth every 6 (six) hours as needed for moderate pain or headache.    unknown   albuterol (VENTOLIN HFA) 108 (90 Base) MCG/ACT inhaler Inhale 2 puffs into the lungs every 6 (six) hours as needed for wheezing or shortness of breath. 1 Inhaler 0 unknown   Ascorbic Acid (VITAMIN C PO) Take 1 tablet by mouth daily.   06/20/2019 at Unknown time   aspirin 81 MG chewable tablet Chew 81 mg by mouth daily. Every other day per pt   06/20/2019 at Unknown time   Cholecalciferol (VITAMIN D3) 2000 units capsule Take 2,000 Units by mouth daily.   06/20/2019 at Unknown time   Coenzyme Q10 (CO Q 10 PO) Take 1 capsule by mouth daily.    06/20/2019 at Unknown time   fexofenadine (ALLEGRA) 180 MG tablet Take 180 mg by mouth daily.   06/20/2019 at Unknown time   hydrochlorothiazide (HYDRODIURIL) 25 MG tablet TAKE 1 TABLET(25 MG) BY MOUTH DAILY (Patient taking differently: Take 25 mg by mouth daily. ) 90 tablet 1 06/20/2019 at Unknown time   lisinopril (PRINIVIL,ZESTRIL) 20 MG tablet TAKE 1 TABLET BY MOUTH EVERY DAY (Patient taking  differently: Take 20 mg by mouth daily. ) 90 tablet 3 06/20/2019 at Unknown time   metoprolol succinate (TOPROL-XL) 25 MG 24 hr tablet TAKE 1 TABLET BY MOUTH TWICE DAILY (Patient taking differently: Take 25 mg by mouth 2 (two) times daily. ** DO NOT CRUSH **    (BETA BLOCKER)) 180 tablet 3 06/20/2019 at 3pm   montelukast (SINGULAIR) 10 MG tablet Take 1 tablet (10 mg total) by mouth at bedtime. 90 tablet 1 06/19/2019 at Unknown time   Multiple Vitamin (MULTIVITAMIN) tablet Take 1 tablet by mouth daily.     06/20/2019 at Unknown time    Inpatient Medications:   aspirin  81 mg Oral Daily   clopidogrel  75 mg Oral Q breakfast   enoxaparin (LOVENOX) injection  40 mg  Subcutaneous Q24H   ezetimibe  10 mg Oral Daily   lisinopril  20 mg Oral Daily   metoprolol succinate  25 mg Oral BID   montelukast  10 mg Oral QHS    Allergies:  Allergies  Allergen Reactions   Lovastatin Other (See Comments)    myalgias   Sudafed [Pseudoephedrine Hcl] Other (See Comments)    Hallucinations     Social History   Socioeconomic History   Marital status: Widowed    Spouse name: Not on file   Number of children: 2   Years of education: Not on file   Highest education level: Not on file  Occupational History   Not on file  Social Needs   Financial resource strain: Not on file   Food insecurity    Worry: Not on file    Inability: Not on file   Transportation needs    Medical: Not on file    Non-medical: Not on file  Tobacco Use   Smoking status: Never Smoker   Smokeless tobacco: Never Used  Substance and Sexual Activity   Alcohol use: No   Drug use: No   Sexual activity: Not Currently  Lifestyle   Physical activity    Days per week: Not on file    Minutes per session: Not on file   Stress: Not on file  Relationships   Social connections    Talks on phone: Not on file    Gets together: Not on file    Attends religious service: Not on file    Active member of club or organization: Not on file    Attends meetings of clubs or organizations: Not on file    Relationship status: Not on file   Intimate partner violence    Fear of current or ex partner: Not on file    Emotionally abused: Not on file    Physically abused: Not on file    Forced sexual activity: Not on file  Other Topics Concern   Not on file  Social History Narrative   Not on file     Family History  Problem Relation Age of Onset   Mitral valve prolapse Daughter    Heart disease Mother    Kidney failure Mother    Diabetes Mother    Cancer Mother        uterine   Hyperlipidemia Mother    Hypertension Mother    Uterine cancer Mother     Dementia Father    Diabetes Sister    Diabetes Paternal Grandmother    Throat cancer Brother    Colon cancer Neg Hx    Colon polyps Neg Hx    Esophageal cancer Neg Hx  Rectal cancer Neg Hx    Stomach cancer Neg Hx       Review of Systems: All other systems reviewed and are otherwise negative except as noted above.  Physical Exam: Vitals:   06/21/19 0730 06/21/19 0800 06/21/19 0937 06/21/19 1224  BP: 130/79 128/62 (!) 144/64 (!) 124/44  Pulse: 80 73 68 68  Resp: 14 17 16 16   Temp:   98.4 F (36.9 C) 98.2 F (36.8 C)  TempSrc:   Oral Axillary  SpO2: 96% 92% 96% 93%    GEN- The patient is well appearing, alert and oriented x 3 today.   Head- normocephalic, atraumatic Eyes-  Sclera clear, conjunctiva pink Ears- hearing intact Oropharynx- clear Neck- supple Lungs- Clear to ausculation bilaterally, normal work of breathing Heart- Regular rate and rhythm, no murmurs, rubs or gallops  GI- soft, NT, ND, + BS Extremities- no clubbing, cyanosis, or edema MS- no significant deformity or atrophy Skin- no rash or lesion Psych- euthymic mood, full affect   Labs:   Lab Results  Component Value Date   WBC 11.2 (H) 06/20/2019   HGB 12.2 06/20/2019   HCT 36.0 06/20/2019   MCV 91.9 06/20/2019   PLT 217 06/20/2019    Recent Labs  Lab 06/20/19 1900 06/20/19 1947  NA 130* 131*  K 4.0 3.9  CL 96* 96*  CO2 22  --   BUN 19 19  CREATININE 0.81 0.80  CALCIUM 9.1  --   PROT 7.9  --   BILITOT 0.5  --   ALKPHOS 106  --   ALT 21  --   AST 30  --   GLUCOSE 102* 99     Radiology/Studies: Ct Head Wo Contrast  Result Date: 06/20/2019 CLINICAL DATA:  Right-sided numbness and aphasia for approximately 15 minutes earlier this evening. EXAM: CT HEAD WITHOUT CONTRAST TECHNIQUE: Contiguous axial images were obtained from the base of the skull through the vertex without intravenous contrast. COMPARISON:  Brain MRI dated 08/29/2008. Head CT dated 08/28/2008. FINDINGS: Brain:  Minimally prominent ventricles and subarachnoid spaces with minimal progression. No intracranial hemorrhage, mass lesion or CT evidence of acute infarction. Vascular: No hyperdense vessel or unexpected calcification. Skull: Normal. Negative for fracture or focal lesion. Sinuses/Orbits: Status post bilateral cataract extraction. Bilateral maxillary sinus air-fluid levels. Mild bilateral ethmoid sinus mucosal thickening and mild left frontal sinus mucosal thickening. Other: None. IMPRESSION: 1. No acute abnormality. 2. Minimal diffuse cerebral and cerebellar atrophy with minimal progression. 3. Acute bilateral maxillary sinusitis. Electronically Signed   By: Claudie Revering M.D.   On: 06/20/2019 20:34   Mr Angio Head Wo Contrast  Result Date: 06/21/2019 CLINICAL DATA:  Stroke workup. Patient presented with right-sided numbness and aphasia EXAM: MRA HEAD WITHOUT CONTRAST TECHNIQUE: Angiographic images of the Circle of Willis were obtained using MRA technique without intravenous contrast. COMPARISON:  08/29/2018 FINDINGS: Carotid, vertebral, and basilar arteries are smooth and widely patent. Mild left A2 segment narrowing. Mild distal MCA branch narrowing versus artifact. No major branch occlusion or flow limiting stenosis. Negative for aneurysm. IMPRESSION: 1. No emergent finding. 2. Mild atheromatous changes. 3. Maxillary sinus fluid levels on both sides. Electronically Signed   By: Monte Fantasia M.D.   On: 06/21/2019 11:59   Mr Brain Wo Contrast  Result Date: 06/20/2019 CLINICAL DATA:  Right-sided numbness and aphasia EXAM: MRI HEAD WITHOUT CONTRAST TECHNIQUE: Multiplanar, multiecho pulse sequences of the brain and surrounding structures were obtained without intravenous contrast. COMPARISON:  None. FINDINGS: BRAIN: There is  no acute infarct, acute hemorrhage or extra-axial collection. Multifocal white matter hyperintensity, most commonly due to chronic ischemic microangiopathy. The cerebral and cerebellar  volume are age-appropriate. There is no hydrocephalus. The midline structures are normal. VASCULAR: The major intracranial arterial and venous sinus flow voids are normal. Susceptibility-sensitive sequences show no chronic microhemorrhage or superficial siderosis. SKULL AND UPPER CERVICAL SPINE: Hypertrophy at the atlantoaxial joint with mild narrowing of foramen magnum. SINUSES/ORBITS: Mild maxillary sinus mucosal thickening. No mastoid or middle ear effusion. The orbits are normal. IMPRESSION: 1. No acute intracranial abnormality. 2. Mild chronic small vessel disease. Electronically Signed   By: Ulyses Jarred M.D.   On: 06/20/2019 22:56   Vas US Carotid (at [place] Only)  Result Date: 06/21/2019 Carotid Arterial Duplex Study Indications:       TIA. Risk Factors:      Hypertension, hyperlipidemia. Comparison Study:  previous study done 09/15/2011 Performing Technologist: Abram Sander RVS  Examination Guidelines: A complete evaluation includes B-mode imaging, spectral Doppler, color Doppler, and power Doppler as needed of all accessible portions of each vessel. Bilateral testing is considered an integral part of a complete examination. Limited examinations for reoccurring indications may be performed as noted.  Right Carotid Findings: +----------+--------+--------+--------+------------------+--------+             PSV cm/s EDV cm/s Stenosis Plaque Description Comments  +----------+--------+--------+--------+------------------+--------+  CCA Prox   100      13                heterogenous                 +----------+--------+--------+--------+------------------+--------+  CCA Distal 80       11                heterogenous                 +----------+--------+--------+--------+------------------+--------+  ICA Prox   84       10       1-39%    heterogenous                 +----------+--------+--------+--------+------------------+--------+  ICA Distal 66       11                                              +----------+--------+--------+--------+------------------+--------+  ECA        140      13                                             +----------+--------+--------+--------+------------------+--------+ +----------+--------+-------+--------+-------------------+             PSV cm/s EDV cms Describe Arm Pressure (mmHG)  +----------+--------+-------+--------+-------------------+  Subclavian 101                                            +----------+--------+-------+--------+-------------------+ +---------+--------+--+--------+--+---------+  Vertebral PSV cm/s 49 EDV cm/s 13 Antegrade  +---------+--------+--+--------+--+---------+  Left Carotid Findings: +----------+--------+--------+--------+------------------+--------+             PSV cm/s EDV cm/s Stenosis Plaque Description Comments  +----------+--------+--------+--------+------------------+--------+  CCA Prox   126  12                heterogenous                 +----------+--------+--------+--------+------------------+--------+  CCA Distal 94       17                heterogenous                 +----------+--------+--------+--------+------------------+--------+  ICA Prox   79       14       1-39%    heterogenous                 +----------+--------+--------+--------+------------------+--------+  ICA Distal 72       15                                             +----------+--------+--------+--------+------------------+--------+  ECA        100                                                     +----------+--------+--------+--------+------------------+--------+ +----------+--------+--------+--------+-------------------+             PSV cm/s EDV cm/s Describe Arm Pressure (mmHG)  +----------+--------+--------+--------+-------------------+  Subclavian 147                                             +----------+--------+--------+--------+-------------------+ +---------+--------+--+--------+-+  Vertebral PSV cm/s 48 EDV cm/s 6  +---------+--------+--+--------+-+   Summary: Right Carotid: Velocities in the right ICA are consistent with a 1-39% stenosis. Left Carotid: Velocities in the left ICA are consistent with a 1-39% stenosis. Vertebrals: Bilateral vertebral arteries demonstrate antegrade flow. *See table(s) above for measurements and observations.     Preliminary     12-lead ECG NSR at 76 bpm (personally reviewed) All prior EKG's in EPIC reviewed with no documented atrial fibrillation  Telemetry NSR 70-80s (personally reviewed)  Assessment and Plan:  1. Cryptogenic stroke The patient presents with cryptogenic stroke.  The patient does not have a TEE planned.  I spoke at length with the patient about monitoring for afib with an implantable loop recorder.  Risks, benefits, and alteratives to implantable loop recorder were discussed with the patient today.   At this time, the patient refuses loop consideration, and has opted to wear an event monitor, but would first like to check what the coverage under her insurance would be.   Information on loop recorder will be placed in patients discharge instructions, as well as how to contact our office.  A follow up appointment will be made in 6-8 weeks at our office to discuss results of monitor +/- loop consideration.   Shirley Friar, PA-C 06/21/2019 1:25 PM  EP Attending  We are asked to see regarding ILR insertion in a patient with a cryptogenic stroke. I have reviewed the indications/risks/benefits/but she would prefer not to have an ILR inserted. She is willing to wear a cardiac monitor as an outpatient.  Mikle Bosworth.D.

## 2019-06-21 NOTE — Discharge Instructions (Signed)
Please contact your insurance provider to inquire about the coverage for a "30 day event monitor" and an "implantable loop recorder" for the diagnosis of cryptogenic stroke or TIA (Transient ischemic attack).    Please call the cardiology office at [phone] to have to event monitor ordered if your are satisfied with your insurance coverage for the event monitor, or would like to move up your appointment to discuss a loop recorder.

## 2019-06-21 NOTE — Progress Notes (Signed)
Carotid duplex has been completed.   Preliminary results in CV Proc.   Abram Sander 06/21/2019 8:41 AM

## 2019-06-21 NOTE — Progress Notes (Signed)
SLP Cancellation Note  Patient Details Name: Kerry [name] MRN: [id number] DOB: 08-12-1941   Cancelled treatment:       Reason Eval/Treat Not Completed: SLP screened, no needs identified, will sign off. MRI negative. No speech deficits per RN. Will d/c orders.    Denorris Reust, Katherene Ponto 06/21/2019, 11:13 AM

## 2019-06-21 NOTE — Evaluation (Signed)
Physical Therapy Evaluation Patient Details Name: Kerry [name] MRN: [id number] DOB: 10-11-1940 Today's Date: 06/21/2019   History of Present Illness  78 y.o. female with medical history significant for hypertension, asthma, migraines, and hyperlipidemia with reported statin intolerance, now presenting to emergency department after an episode of right-sided numbness and speech difficulty  Clinical Impression  Pt presents to PT s/p TIA, with resolution of all symptoms per pt report. Pt is able to ambulate and perform all mobility required in the home independently and has no deficits in sensation or vision compared to baseline. Pt requries no further acute PT services. PT recommends discharge home with no PT or DME needs.    Follow Up Recommendations No PT follow up    Equipment Recommendations  None recommended by PT    Recommendations for Other Services       Precautions / Restrictions Precautions Precautions: None Restrictions Weight Bearing Restrictions: No      Mobility  Bed Mobility Overal bed mobility: (pt received up in chair)                Transfers Overall transfer level: Independent Equipment used: None             General transfer comment: pt performed 2 sit to stands independently  Ambulation/Gait Ambulation/Gait assistance: Independent Gait Distance (Feet): 200 Feet Assistive device: None Gait Pattern/deviations: WFL(Within Functional Limits) Gait velocity: WFL Gait velocity interpretation: 1.31 - 2.62 ft/sec, indicative of limited community ambulator General Gait Details: unremarkable gait  Stairs Stairs: Yes Stairs assistance: Modified independent (Device/Increase time) Stair Management: Two rails;Forwards Number of Stairs: 2(2 steps x 3 trials) General stair comments: PT providing cueing for stair sequencing to reduce chronic soreness of LLE however not helpful to pt  Wheelchair Mobility    Modified Rankin (Stroke Patients  Only) Modified Rankin (Stroke Patients Only) Pre-Morbid Rankin Score: No symptoms Modified Rankin: No symptoms     Balance Overall balance assessment: Independent                                           Pertinent Vitals/Pain Pain Assessment: No/denies pain    Home Living Family/patient expects to be discharged to:: Private residence Living Arrangements: Alone Available Help at Discharge: (not determined) Type of Home: House Home Access: Stairs to enter Entrance Stairs-Rails: Can reach both Entrance Stairs-Number of Steps: 2 Home Layout: One level Home Equipment: (not determined)      Prior Function Level of Independence: Independent         Comments: pt walking 1-3 miles per day     Hand Dominance        Extremity/Trunk Assessment   Upper Extremity Assessment Upper Extremity Assessment: Overall WFL for tasks assessed    Lower Extremity Assessment Lower Extremity Assessment: Overall WFL for tasks assessed    Cervical / Trunk Assessment Cervical / Trunk Assessment: Normal  Communication   Communication: No difficulties  Cognition Arousal/Alertness: Awake/alert Behavior During Therapy: WFL for tasks assessed/performed Overall Cognitive Status: Within Functional Limits for tasks assessed                                        General Comments      Exercises     Assessment/Plan    PT Assessment Patent does not need  any further PT services  PT Problem List         PT Treatment Interventions      PT Goals (Current goals can be found in the Care Plan section)       Frequency     Barriers to discharge        Co-evaluation               AM-PAC PT "6 Clicks" Mobility  Outcome Measure Help needed turning from your back to your side while in a flat bed without using bedrails?: None Help needed moving from lying on your back to sitting on the side of a flat bed without using bedrails?: None Help  needed moving to and from a bed to a chair (including a wheelchair)?: None Help needed standing up from a chair using your arms (e.g., wheelchair or bedside chair)?: None Help needed to walk in hospital room?: None Help needed climbing 3-5 steps with a railing? : None 6 Click Score: 24    End of Session Equipment Utilized During Treatment: (none) Activity Tolerance: Patient tolerated treatment well Patient left: in chair;with call bell/phone within reach Nurse Communication: Mobility status PT Visit Diagnosis: Other symptoms and signs involving the nervous system (R29.898)    Time: 1255-1305 PT Time Calculation (min) (ACUTE ONLY): 10 min   Charges:   PT Evaluation $PT Eval Low Complexity: 1 Low          Zenaida Niece, PT, DPT Acute Rehabilitation Pager: [phone]   Zenaida Niece 06/21/2019, 1:43 PM

## 2019-06-21 NOTE — H&P (Signed)
History and Physical    Kerry [name] [id number] DOB: Feb 14, 1941 DOA: 06/20/2019  PCP: Forrest Moron, MD   Patient coming from: Home   Chief Complaint: Right-sided numbness, speech difficulty   HPI: Kerry [name] is a 78 y.o. female with medical history significant for hypertension, asthma, migraines, and hyperlipidemia with reported statin intolerance, now presenting to emergency department after an episode of right-sided numbness and speech difficulty.  Patient reports that she had been in her usual state of health and was having an uneventful day when she developed acute onset numbness and tingling involving her right side.  She was also having speech difficulty at that time, described by her daughter as slurring her words, but also seemed to have difficulty finding the right words to use.  Initially, the patient thought that she was having a migraine headache come on, but symptoms are different than her prior prodromes and then resolved after about 15 minutes with no headache.  She has not been having any chest pain or palpitations.  She denies fevers, chills, or shortness of breath.  ED Course: Upon arrival to the ED, patient is found to be afebrile, saturating mid 90s on room air, and with stable blood pressure.  EKG features a sinus rhythm and noncontrast head CT is negative for acute intracranial abnormality, but notable for acute bilateral maxillary sinusitis.  Chemistry panel notable for sodium of 130.  CBC features of leukocytosis 12,200.  Urinalysis unremarkable.  Neurology was consulted by the ED physician and recommended a medical admission to [facility] for further evaluation and management.  Review of Systems:  All other systems reviewed and apart from HPI, are negative.  Past Medical History:  Diagnosis Date   Allergy    Anemia    in her 20's   Arthritis    not dx'd- just per pt    Asthma    AS A CHILD   Cancer ([place])    endometrial cancer with  hysterectomy in 1987   Cancer ([place])    melanoma on face    Cataract    removed both eyes    GERD (gastroesophageal reflux disease)    occ has with diet    History of syncope    HTN (hypertension)    Hyperlipidemia     Past Surgical History:  Procedure Laterality Date   ABDOMINAL HYSTERECTOMY     ANKLE FRACTURE SURGERY     APPENDECTOMY     CATARACT EXTRACTION, BILATERAL     CHOLECYSTECTOMY     COLONOSCOPY  08/01/2007   Dr Henrene Pastor    DENTAL SURGERY     right leg  fracture surgery        reports that she has never smoked. She has never used smokeless tobacco. She reports that she does not drink alcohol or use drugs.  Allergies  Allergen Reactions   Lovastatin Other (See Comments)    myalgias   Sudafed [Pseudoephedrine Hcl] Other (See Comments)    Hallucinations     Family History  Problem Relation Age of Onset   Mitral valve prolapse Daughter    Heart disease Mother    Kidney failure Mother    Diabetes Mother    Cancer Mother        uterine   Hyperlipidemia Mother    Hypertension Mother    Uterine cancer Mother    Dementia Father    Diabetes Sister    Diabetes Paternal Grandmother    Throat cancer Brother  Colon cancer Neg Hx    Colon polyps Neg Hx    Esophageal cancer Neg Hx    Rectal cancer Neg Hx    Stomach cancer Neg Hx      Prior to Admission medications   Medication Sig Start Date End Date Taking? Authorizing Provider  acetaminophen (TYLENOL) 500 MG tablet Take 500 mg by mouth every 6 (six) hours as needed for moderate pain or headache.    Yes [provider]  albuterol (VENTOLIN HFA) 108 (90 Base) MCG/ACT inhaler Inhale 2 puffs into the lungs every 6 (six) hours as needed for wheezing or shortness of breath. 01/21/19  Yes Forrest Moron, MD  Ascorbic Acid (VITAMIN C PO) Take 1 tablet by mouth daily.   Yes [provider]  aspirin 81 MG chewable tablet Chew 81 mg by mouth daily. Every other day per  pt   Yes [provider]  Cholecalciferol (VITAMIN D3) 2000 units capsule Take 2,000 Units by mouth daily.   Yes [provider]  Coenzyme Q10 (CO Q 10 PO) Take 1 capsule by mouth daily.    Yes [provider]  fexofenadine (ALLEGRA) 180 MG tablet Take 180 mg by mouth daily.   Yes [provider]  hydrochlorothiazide (HYDRODIURIL) 25 MG tablet TAKE 1 TABLET(25 MG) BY MOUTH DAILY Patient taking differently: Take 25 mg by mouth daily.  03/05/19  Yes Turner, Eber Hong, MD  lisinopril (PRINIVIL,ZESTRIL) 20 MG tablet TAKE 1 TABLET BY MOUTH EVERY DAY Patient taking differently: Take 20 mg by mouth daily.  09/03/18  Yes Turner, Eber Hong, MD  metoprolol succinate (TOPROL-XL) 25 MG 24 hr tablet TAKE 1 TABLET BY MOUTH TWICE DAILY Patient taking differently: Take 25 mg by mouth 2 (two) times daily. ** DO NOT CRUSH **    (BETA BLOCKER) 09/14/18  Yes Turner, Traci R, MD  montelukast (SINGULAIR) 10 MG tablet Take 1 tablet (10 mg total) by mouth at bedtime. 01/21/19  Yes Forrest Moron, MD  Multiple Vitamin (MULTIVITAMIN) tablet Take 1 tablet by mouth daily.     Yes [provider]    Physical Exam: Vitals:   06/20/19 2300 06/20/19 2330 06/21/19 0000 06/21/19 0100  BP: (!) 141/62 (!) 133/56 (!) 147/58 (!) 145/59  Pulse: 69 71 72 65  Resp: 18 17 (!) 23 19  Temp:      TempSrc:      SpO2: 96% 94% 97% 96%    Constitutional: NAD, calm  Eyes: PERTLA, lids and conjunctivae normal ENMT: Mucous membranes are moist. Posterior pharynx clear of any exudate or lesions.   Neck: normal, supple, no masses, no thyromegaly Respiratory:  no wheezing, no crackles. Normal respiratory effort. No accessory muscle use.  Cardiovascular: S1 & S2 heard, regular rate and rhythm. No significant JVD. Abdomen: No distension, no tenderness, soft. Bowel sounds active.  Musculoskeletal: no clubbing / cyanosis. No joint deformity upper and lower extremities.   Skin: no significant rashes,  lesions, ulcers. Warm, dry, well-perfused. Neurologic: CN 2-12 grossly intact. Sensation intact. Strength 5/5 in all 4 limbs.  Psychiatric:  Alert and oriented x 3. Very pleasant, cooperative.    Labs on Admission: I have personally reviewed following labs and imaging studies  CBC: Recent Labs  Lab 06/20/19 1900 06/20/19 1947  WBC 11.2*  --   NEUTROABS 7.7  --   HGB 12.0 12.2  HCT 35.4* 36.0  MCV 91.9  --   PLT 217  --    Basic Metabolic Panel: Recent  Labs  Lab 06/20/19 1900 06/20/19 1947  NA 130* 131*  K 4.0 3.9  CL 96* 96*  CO2 22  --   GLUCOSE 102* 99  BUN 19 19  CREATININE 0.81 0.80  CALCIUM 9.1  --    GFR: CrCl cannot be calculated (Unknown ideal weight.). Liver Function Tests: Recent Labs  Lab 06/20/19 1900  AST 30  ALT 21  ALKPHOS 106  BILITOT 0.5  PROT 7.9  ALBUMIN 4.0   No results for input(s): LIPASE, AMYLASE in the last 168 hours. No results for input(s): AMMONIA in the last 168 hours. Coagulation Profile: Recent Labs  Lab 06/20/19 1900  INR 0.9   Cardiac Enzymes: No results for input(s): CKTOTAL, CKMB, CKMBINDEX, TROPONINI in the last 168 hours. BNP (last 3 results) No results for input(s): PROBNP in the last 8760 hours. HbA1C: No results for input(s): HGBA1C in the last 72 hours. CBG: No results for input(s): GLUCAP in the last 168 hours. Lipid Profile: No results for input(s): CHOL, HDL, LDLCALC, TRIG, CHOLHDL, LDLDIRECT in the last 72 hours. Thyroid Function Tests: No results for input(s): TSH, T4TOTAL, FREET4, T3FREE, THYROIDAB in the last 72 hours. Anemia Panel: No results for input(s): VITAMINB12, FOLATE, FERRITIN, TIBC, IRON, RETICCTPCT in the last 72 hours. Urine analysis:    Component Value Date/Time   COLORURINE YELLOW 06/20/2019 1900   APPEARANCEUR CLEAR 06/20/2019 1900   LABSPEC 1.013 06/20/2019 1900   PHURINE 5.0 06/20/2019 1900   GLUCOSEU NEGATIVE 06/20/2019 1900   HGBUR NEGATIVE 06/20/2019 1900   BILIRUBINUR  NEGATIVE 06/20/2019 1900   BILIRUBINUR negative 02/22/2018 1741   KETONESUR NEGATIVE 06/20/2019 1900   PROTEINUR NEGATIVE 06/20/2019 1900   UROBILINOGEN 0.2 02/22/2018 1741   UROBILINOGEN 0.2 08/28/2008 1454   NITRITE NEGATIVE 06/20/2019 1900   LEUKOCYTESUR TRACE (A) 06/20/2019 1900   Sepsis Labs: @LABRCNTIP (procalcitonin:4,lacticidven:4) )No results found for this or any previous visit (from the past 240 hour(s)).   Radiological Exams on Admission: Ct Head Wo Contrast  Result Date: 06/20/2019 CLINICAL DATA:  Right-sided numbness and aphasia for approximately 15 minutes earlier this evening. EXAM: CT HEAD WITHOUT CONTRAST TECHNIQUE: Contiguous axial images were obtained from the base of the skull through the vertex without intravenous contrast. COMPARISON:  Brain MRI dated 08/29/2008. Head CT dated 08/28/2008. FINDINGS: Brain: Minimally prominent ventricles and subarachnoid spaces with minimal progression. No intracranial hemorrhage, mass lesion or CT evidence of acute infarction. Vascular: No hyperdense vessel or unexpected calcification. Skull: Normal. Negative for fracture or focal lesion. Sinuses/Orbits: Status post bilateral cataract extraction. Bilateral maxillary sinus air-fluid levels. Mild bilateral ethmoid sinus mucosal thickening and mild left frontal sinus mucosal thickening. Other: None. IMPRESSION: 1. No acute abnormality. 2. Minimal diffuse cerebral and cerebellar atrophy with minimal progression. 3. Acute bilateral maxillary sinusitis. Electronically Signed   By: Claudie Revering M.D.   On: 06/20/2019 20:34   Mr Brain Wo Contrast  Result Date: 06/20/2019 CLINICAL DATA:  Right-sided numbness and aphasia EXAM: MRI HEAD WITHOUT CONTRAST TECHNIQUE: Multiplanar, multiecho pulse sequences of the brain and surrounding structures were obtained without intravenous contrast. COMPARISON:  None. FINDINGS: BRAIN: There is no acute infarct, acute hemorrhage or extra-axial collection. Multifocal  white matter hyperintensity, most commonly due to chronic ischemic microangiopathy. The cerebral and cerebellar volume are age-appropriate. There is no hydrocephalus. The midline structures are normal. VASCULAR: The major intracranial arterial and venous sinus flow voids are normal. Susceptibility-sensitive sequences show no chronic microhemorrhage or superficial siderosis. SKULL AND UPPER CERVICAL SPINE: Hypertrophy at the atlantoaxial  joint with mild narrowing of foramen magnum. SINUSES/ORBITS: Mild maxillary sinus mucosal thickening. No mastoid or middle ear effusion. The orbits are normal. IMPRESSION: 1. No acute intracranial abnormality. 2. Mild chronic small vessel disease. Electronically Signed   By: Ulyses Jarred M.D.   On: 06/20/2019 22:56    EKG: Independently reviewed. Sinus rhythm, non-specific IVCD, LVH.   Assessment/Plan   1. Transient neurologic deficits  - Presents after a ~15 minute episode of right-sided numbness, dysarthria, and expressive aphasia  - No acute abnormality on MRI brain  - Neurology consulted by ED physician and much appreciated  - Continue cardiac monitoring, neuro checks  - Check echocardiogram, carotid US, A1c and lipids  - Continue ASA    2. Hypertension  - Continue Toprol and lisinopril, hold HCTZ while hydrating    3. Hyponatremia  - Serum sodium is 130 on admission in setting of hypovolemia  - Hold HCTZ, hydrate gently with NS overnight, repeat chem panel in am    4. Asthma  - No wheezing or SOB on admission  - Continue albuterol as needed     PPE: Mask, face shield  DVT prophylaxis: Lovenox  Code Status: Full  Family Communication: Daughter updated at bedside Consults called: Neurology consulted by ED physician  Admission status: Observation     Vianne Bulls, MD [facility] Pager [phone]  If 7PM-7AM, please contact night-coverage [email or web address] Password [email or web address]  06/21/2019, 1:16 AM

## 2019-06-21 NOTE — Progress Notes (Signed)
STROKE TEAM PROGRESS NOTE   INTERVAL HISTORY Pt lying in bed, pleasant and stated that she was cooking and trying to talk to her friend and not able to get words out. Also felt right arm and leg numbness. She called her daughter who sent her to ER. The episode lasted about 15-20 min and her symptoms resolved. She denies heart palpitation or racing heart.   Vitals:   06/21/19 0700 06/21/19 0730 06/21/19 0800 06/21/19 0937  BP: (!) 127/50 130/79 128/62 (!) 144/64  Pulse: 69 80 73 68  Resp: 18 14 17 16   Temp:    98.4 F (36.9 C)  TempSrc:    Oral  SpO2: 93% 96% 92% 96%    CBC:  Recent Labs  Lab 06/20/19 1900 06/20/19 1947  WBC 11.2*  --   NEUTROABS 7.7  --   HGB 12.0 12.2  HCT 35.4* 36.0  MCV 91.9  --   PLT 217  --     Basic Metabolic Panel:  Recent Labs  Lab 06/20/19 1900 06/20/19 1947  NA 130* 131*  K 4.0 3.9  CL 96* 96*  CO2 22  --   GLUCOSE 102* 99  BUN 19 19  CREATININE 0.81 0.80  CALCIUM 9.1  --    Lipid Panel:     Component Value Date/Time   CHOL 163 06/21/2019 0531   CHOL 161 02/05/2019 0928   TRIG 83 06/21/2019 0531   HDL 55 06/21/2019 0531   HDL 50 02/05/2019 0928   CHOLHDL 3.0 06/21/2019 0531   VLDL 17 06/21/2019 0531   LDLCALC 91 06/21/2019 0531   LDLCALC 88 02/05/2019 0928   HgbA1c:  Lab Results  Component Value Date   HGBA1C 5.6 06/21/2019   Urine Drug Screen:     Component Value Date/Time   LABOPIA NONE DETECTED 06/20/2019 1900   COCAINSCRNUR NONE DETECTED 06/20/2019 1900   LABBENZ NONE DETECTED 06/20/2019 1900   AMPHETMU NONE DETECTED 06/20/2019 1900   THCU NONE DETECTED 06/20/2019 1900   LABBARB NONE DETECTED 06/20/2019 1900    Alcohol Level     Component Value Date/Time   ETH <10 06/20/2019 1900    IMAGING Ct Head Wo Contrast  Result Date: 06/20/2019 CLINICAL DATA:  Right-sided numbness and aphasia for approximately 15 minutes earlier this evening. EXAM: CT HEAD WITHOUT CONTRAST TECHNIQUE: Contiguous axial images were  obtained from the base of the skull through the vertex without intravenous contrast. COMPARISON:  Brain MRI dated 08/29/2008. Head CT dated 08/28/2008. FINDINGS: Brain: Minimally prominent ventricles and subarachnoid spaces with minimal progression. No intracranial hemorrhage, mass lesion or CT evidence of acute infarction. Vascular: No hyperdense vessel or unexpected calcification. Skull: Normal. Negative for fracture or focal lesion. Sinuses/Orbits: Status post bilateral cataract extraction. Bilateral maxillary sinus air-fluid levels. Mild bilateral ethmoid sinus mucosal thickening and mild left frontal sinus mucosal thickening. Other: None. IMPRESSION: 1. No acute abnormality. 2. Minimal diffuse cerebral and cerebellar atrophy with minimal progression. 3. Acute bilateral maxillary sinusitis. Electronically Signed   By: Claudie Revering M.D.   On: 06/20/2019 20:34   Mr Angio Head Wo Contrast  Result Date: 06/21/2019 CLINICAL DATA:  Stroke workup. Patient presented with right-sided numbness and aphasia EXAM: MRA HEAD WITHOUT CONTRAST TECHNIQUE: Angiographic images of the Circle of Willis were obtained using MRA technique without intravenous contrast. COMPARISON:  08/29/2018 FINDINGS: Carotid, vertebral, and basilar arteries are smooth and widely patent. Mild left A2 segment narrowing. Mild distal MCA branch narrowing versus artifact. No major branch occlusion or  flow limiting stenosis. Negative for aneurysm. IMPRESSION: 1. No emergent finding. 2. Mild atheromatous changes. 3. Maxillary sinus fluid levels on both sides. Electronically Signed   By: Monte Fantasia M.D.   On: 06/21/2019 11:59   Mr Brain Wo Contrast  Result Date: 06/20/2019 CLINICAL DATA:  Right-sided numbness and aphasia EXAM: MRI HEAD WITHOUT CONTRAST TECHNIQUE: Multiplanar, multiecho pulse sequences of the brain and surrounding structures were obtained without intravenous contrast. COMPARISON:  None. FINDINGS: BRAIN: There is no acute infarct,  acute hemorrhage or extra-axial collection. Multifocal white matter hyperintensity, most commonly due to chronic ischemic microangiopathy. The cerebral and cerebellar volume are age-appropriate. There is no hydrocephalus. The midline structures are normal. VASCULAR: The major intracranial arterial and venous sinus flow voids are normal. Susceptibility-sensitive sequences show no chronic microhemorrhage or superficial siderosis. SKULL AND UPPER CERVICAL SPINE: Hypertrophy at the atlantoaxial joint with mild narrowing of foramen magnum. SINUSES/ORBITS: Mild maxillary sinus mucosal thickening. No mastoid or middle ear effusion. The orbits are normal. IMPRESSION: 1. No acute intracranial abnormality. 2. Mild chronic small vessel disease. Electronically Signed   By: Ulyses Jarred M.D.   On: 06/20/2019 22:56   Vas US Carotid (at [facility] Only)  Result Date: 06/21/2019 Carotid Arterial Duplex Study Indications:       TIA. Risk Factors:      Hypertension, hyperlipidemia. Comparison Study:  previous study done 09/15/2011 Performing Technologist: Abram Sander RVS  Examination Guidelines: A complete evaluation includes B-mode imaging, spectral Doppler, color Doppler, and power Doppler as needed of all accessible portions of each vessel. Bilateral testing is considered an integral part of a complete examination. Limited examinations for reoccurring indications may be performed as noted.  Right Carotid Findings: +----------+--------+--------+--------+------------------+--------+           PSV cm/sEDV cm/sStenosisPlaque DescriptionComments +----------+--------+--------+--------+------------------+--------+ CCA Prox  100     13              heterogenous               +----------+--------+--------+--------+------------------+--------+ CCA Distal80      11              heterogenous               +----------+--------+--------+--------+------------------+--------+ ICA Prox  84      10      1-39%    heterogenous               +----------+--------+--------+--------+------------------+--------+ ICA Distal66      11                                         +----------+--------+--------+--------+------------------+--------+ ECA       140     13                                         +----------+--------+--------+--------+------------------+--------+ +----------+--------+-------+--------+-------------------+           PSV cm/sEDV cmsDescribeArm Pressure (mmHG) +----------+--------+-------+--------+-------------------+ Subclavian101                                        +----------+--------+-------+--------+-------------------+ +---------+--------+--+--------+--+---------+ VertebralPSV cm/s49EDV cm/s13Antegrade +---------+--------+--+--------+--+---------+  Left Carotid Findings: +----------+--------+--------+--------+------------------+--------+  PSV cm/sEDV cm/sStenosisPlaque DescriptionComments +----------+--------+--------+--------+------------------+--------+ CCA Prox  126     12              heterogenous               +----------+--------+--------+--------+------------------+--------+ CCA Distal94      17              heterogenous               +----------+--------+--------+--------+------------------+--------+ ICA Prox  79      14      1-39%   heterogenous               +----------+--------+--------+--------+------------------+--------+ ICA Distal72      15                                         +----------+--------+--------+--------+------------------+--------+ ECA       100                                                +----------+--------+--------+--------+------------------+--------+ +----------+--------+--------+--------+-------------------+           PSV cm/sEDV cm/sDescribeArm Pressure (mmHG) +----------+--------+--------+--------+-------------------+ [id number]                                          +----------+--------+--------+--------+-------------------+ +---------+--------+--+--------+-+ VertebralPSV cm/s48EDV cm/s6 +---------+--------+--+--------+-+  Summary: Right Carotid: Velocities in the right ICA are consistent with a 1-39% stenosis. Left Carotid: Velocities in the left ICA are consistent with a 1-39% stenosis. Vertebrals: Bilateral vertebral arteries demonstrate antegrade flow. *See table(s) above for measurements and observations.     Preliminary     PHYSICAL EXAM  Temp:  [98.2 F (36.8 C)-98.4 F (36.9 C)] 98.2 F (36.8 C) (10/09 1224) Pulse Rate:  [65-81] 68 (10/09 1224) Resp:  [13-23] 16 (10/09 1224) BP: (103-163)/(44-90) 124/44 (10/09 1224) SpO2:  [92 %-98 %] 93 % (10/09 1224)  General - Well nourished, well developed, in no apparent distress.  Ophthalmologic - fundi not visualized due to noncooperation.  Cardiovascular - Regular rhythm and rate.  Mental Status -  Level of arousal and orientation to time, place, and person were intact. Language including expression, naming, repetition, comprehension was assessed and found intact. Fund of Knowledge was assessed and was intact.  Cranial Nerves II - XII - II - Visual field intact OU. III, IV, VI - Extraocular movements intact. V - Facial sensation intact bilaterally. VII - Facial movement intact bilaterally. VIII - Hearing & vestibular intact bilaterally. X - Palate elevates symmetrically. XI - Chin turning & shoulder shrug intact bilaterally. XII - Tongue protrusion intact.  Motor Strength - The patient's strength was normal in all extremities and pronator drift was absent.  Bulk was normal and fasciculations were absent.   Motor Tone - Muscle tone was assessed at the neck and appendages and was normal.  Reflexes - The patient's reflexes were symmetrical in all extremities and she had no pathological reflexes.  Sensory - Light touch, temperature/pinprick were assessed and were symmetrical.     Coordination - The patient had normal movements in the hands with no ataxia or dysmetria.  Tremor was absent.  Gait and Station - deferred.  ASSESSMENT/PLAN Ms. COLLETTE CHENEY is a 78 y.o. female with history of HTN, HLD, migraines, endometrial cancer s/p TAH 1987 presenting with transient slurred speech with word finding difficulty and right sided numbness.    L brain cortical TIA which appears embolic in origin  CT head No acute abnormality. Minimal cerebral and cerebellar atrophy. Acute sinus dz.    MRI  No acute stroke. Mild small vessel disease.   MRA  No ELVO. Mild atherosclerosis. B maxillary sinus fluid levels.  Carotid Doppler  B ICA 1-39% stenosis, VAs antegrade   2D Echo pending  LE venous dopplers neg DVT   Recommend loop recorder placement to rule out afib  LDL 91  HgbA1c 5.6  Lovenox 40 mg sq daily for VTE prophylaxis  intermittently takes aspirin 81 mg daily prior to admission, now on aspirin 81 mg daily and clopidogrel 75 mg daily. Continue DAPT x 3 weeks then ASPIRIN alone  Therapy recommendations:  No therapy needs  Disposition:  Return home  Hypertension  Stable . BP goal normotensive  Hyperlipidemia  Home meds:  No statin  LDL 91  Add zetia 10 mg daily  Statin intolerance  Continue zetia on diacharge  Other Stroke Risk Factors  Advanced age  Migraines  Other Active Problems  Hyponatremia - [facility] day # 0  Neurology will sign off. Please call with questions. Pt will follow up with stroke clinic NP at [facility] in about 4 weeks. Thanks for the consult.  Rosalin Hawking, MD PhD Stroke Neurology 06/21/2019 3:07 PM    To contact Stroke Continuity provider, please refer to [email or web address] After hours, contact General Neurology

## 2019-06-21 NOTE — Progress Notes (Signed)
Bilateral lower extremity venous duplex completed. Refer to "CV Proc" under chart review to view preliminary results.  06/21/2019 1:57 PM Maudry Mayhew, MHA, RVT, RDCS, RDMS

## 2019-06-21 NOTE — Progress Notes (Signed)
Echocardiogram 2D Echocardiogram has been performed.  Oneal Deputy Shaymus Eveleth 06/21/2019, 2:40 PM

## 2019-06-21 NOTE — ED Notes (Signed)
Pt ambulated to restroom without concerns

## 2019-06-21 NOTE — ED Notes (Addendum)
[facility] contacted for transport to [facility] 3W - room 37

## 2019-06-24 NOTE — Progress Notes (Signed)
1mg  Lorazepam given for anxiety prior to MRI.  1mg  wasted with Arlice Colt RN in stericycle

## 2019-06-26 ENCOUNTER — Encounter: Payer: Self-pay | Admitting: Family Medicine

## 2019-06-26 ENCOUNTER — Other Ambulatory Visit: Payer: Self-pay

## 2019-06-26 ENCOUNTER — Telehealth (INDEPENDENT_AMBULATORY_CARE_PROVIDER_SITE_OTHER): Payer: Medicare Other | Admitting: Family Medicine

## 2019-06-26 DIAGNOSIS — G459 Transient cerebral ischemic attack, unspecified: Secondary | ICD-10-CM

## 2019-06-26 DIAGNOSIS — I10 Essential (primary) hypertension: Secondary | ICD-10-CM

## 2019-06-26 DIAGNOSIS — Z09 Encounter for follow-up examination after completed treatment for conditions other than malignant neoplasm: Secondary | ICD-10-CM

## 2019-06-26 NOTE — Progress Notes (Signed)
Telemedicine Encounter- SOAP NOTE Established Patient  This telephone encounter was conducted with the patient's (or proxy's) verbal consent via audio telecommunications: yes/no: Yes Patient was instructed to have this encounter in a suitably private space; and to only have persons present to whom they give permission to participate. In addition, patient identity was confirmed by use of name plus two identifiers (DOB and address).  I discussed the limitations, risks, security and privacy concerns of performing an evaluation and management service by telephone and the availability of in person appointments. I also discussed with the patient that there may be a patient responsible charge related to this service. The patient expressed understanding and agreed to proceed.  I spent a total of TIME; 0 MIN TO 60 MIN: 20 minutes talking with the patient or their proxy.  CC: sore throat  Subjective   [name] is a 78 y.o. established patient. Telephone visit today for  [facility] follow up 10/8- 10/9 She was seen in the hospital after TIA-like episode Symptoms have resolved She was discharged home with plan for cariology and Neurology follow up  She would like to go back to work because she feels fine. She has not called Cardiology or Neurology as yet  Patient Active Problem List   Diagnosis Date Noted  . Hyponatremia 06/21/2019  . TIA (transient ischemic attack) 06/20/2019  . Aortic insufficiency 08/17/2018  . Hyperlipidemia   . Right thyroid nodule 02/07/2013  . Right carotid bruit 07/16/2012  . Ocular migraine 11/04/2011  . Hypercholesterolemia 06/01/2011  . HTN (hypertension)   . History of syncope   . Asthma     Past Medical History:  Diagnosis Date  . Allergy   . Anemia    in her 20's  . Arthritis    not dx'd- just per pt   . Asthma    AS A CHILD  . Cancer [facility])    endometrial cancer with hysterectomy in 1987  . Cancer ([place])    melanoma on face   .  Cataract    removed both eyes   . GERD (gastroesophageal reflux disease)    occ has with diet   . History of syncope   . HTN (hypertension)   . Hyperlipidemia     Current Outpatient Medications  Medication Sig Dispense Refill  . acetaminophen (TYLENOL) 500 MG tablet Take 500 mg by mouth every 6 (six) hours as needed for moderate pain or headache.     . albuterol (VENTOLIN HFA) 108 (90 Base) MCG/ACT inhaler Inhale 2 puffs into the lungs every 6 (six) hours as needed for wheezing or shortness of breath. 1 Inhaler 0  . Ascorbic Acid (VITAMIN C PO) Take 1 tablet by mouth daily.    Marland Kitchen aspirin 81 MG chewable tablet Chew 81 mg by mouth daily. Every other day per pt    . Cholecalciferol (VITAMIN D3) 2000 units capsule Take 2,000 Units by mouth daily.    . clopidogrel (PLAVIX) 75 MG tablet Take 1 tablet (75 mg total) by mouth daily for 21 days. 21 tablet 0  . Coenzyme Q10 (CO Q 10 PO) Take 1 capsule by mouth daily.     Marland Kitchen ezetimibe (ZETIA) 10 MG tablet Take 1 tablet (10 mg total) by mouth daily. 90 tablet 0  . fexofenadine (ALLEGRA) 180 MG tablet Take 180 mg by mouth daily.    . hydrochlorothiazide (HYDRODIURIL) 25 MG tablet TAKE 1 TABLET(25 MG) BY MOUTH DAILY (Patient taking differently: Take 25 mg  by mouth daily. ) 90 tablet 1  . lisinopril (PRINIVIL,ZESTRIL) 20 MG tablet TAKE 1 TABLET BY MOUTH EVERY DAY (Patient taking differently: Take 20 mg by mouth daily. ) 90 tablet 3  . metoprolol succinate (TOPROL-XL) 25 MG 24 hr tablet TAKE 1 TABLET BY MOUTH TWICE DAILY (Patient taking differently: Take 25 mg by mouth 2 (two) times daily. ** DO NOT CRUSH **    (BETA BLOCKER)) 180 tablet 3  . montelukast (SINGULAIR) 10 MG tablet Take 1 tablet (10 mg total) by mouth at bedtime. 90 tablet 1  . Multiple Vitamin (MULTIVITAMIN) tablet Take 1 tablet by mouth daily.       No current facility-administered medications for this visit.     Allergies  Allergen Reactions  . Lovastatin Other (See Comments)     myalgias  . Sudafed [Pseudoephedrine Hcl] Other (See Comments)    Hallucinations     Social History   Socioeconomic History  . Marital status: Widowed    Spouse name: Not on file  . Number of children: 2  . Years of education: Not on file  . Highest education level: Not on file  Occupational History  . Not on file  Social Needs  . Financial resource strain: Not on file  . Food insecurity    Worry: Not on file    Inability: Not on file  . Transportation needs    Medical: Not on file    Non-medical: Not on file  Tobacco Use  . Smoking status: Never Smoker  . Smokeless tobacco: Never Used  Substance and Sexual Activity  . Alcohol use: No  . Drug use: No  . Sexual activity: Not Currently  Lifestyle  . Physical activity    Days per week: Not on file    Minutes per session: Not on file  . Stress: Not on file  Relationships  . Social Herbalist on phone: Not on file    Gets together: Not on file    Attends religious service: Not on file    Active member of club or organization: Not on file    Attends meetings of clubs or organizations: Not on file    Relationship status: Not on file  . Intimate partner violence    Fear of current or ex partner: Not on file    Emotionally abused: Not on file    Physically abused: Not on file    Forced sexual activity: Not on file  Other Topics Concern  . Not on file  Social History Narrative  . Not on file    ROS Review of Systems  Constitutional: Negative for activity change, appetite change, chills and fever.  HENT: Negative for congestion, nosebleeds, trouble swallowing and voice change.   Respiratory: Negative for cough, shortness of breath and wheezing.   Gastrointestinal: Negative for diarrhea, nausea and vomiting.  Genitourinary: Negative for difficulty urinating, dysuria, flank pain and hematuria.  Musculoskeletal: Negative for back pain, joint swelling and neck pain.  Neurological: Negative for dizziness,  speech difficulty, light-headedness and numbness.  See HPI. All other review of systems negative.   Objective   Vitals as reported by the patient: There were no vitals filed for this visit.  There are no diagnoses linked to this encounter.  TIA -  Continue Plavix and ASA, Cardiology and Neurology follow up Hyponatremia - continue fluid restriction Sore Throat - continue teas and lozenges, avoid cough meds which can have dextromorphan   I discussed the assessment and  treatment plan with the patient. The patient was provided an opportunity to ask questions and all were answered. The patient agreed with the plan and demonstrated an understanding of the instructions.   The patient was advised to call back or seek an in-person evaluation if the symptoms worsen or if the condition fails to improve as anticipated.  I provided 20 minutes of non-face-to-face time during this encounter.  Forrest Moron, MD  Primary Care at [facility]

## 2019-06-26 NOTE — Progress Notes (Signed)
ED f/u admitted 10/8 for tia, says sore throat started while there but thought it could be allergy. She is taking vics and gargling for the sore throat. She will need a note either to remain out or return bk to work. Pharmacy and medication verified.

## 2019-06-27 ENCOUNTER — Other Ambulatory Visit: Payer: Self-pay

## 2019-06-27 ENCOUNTER — Ambulatory Visit (INDEPENDENT_AMBULATORY_CARE_PROVIDER_SITE_OTHER): Payer: Medicare Other | Admitting: Family Medicine

## 2019-06-27 DIAGNOSIS — Z09 Encounter for follow-up examination after completed treatment for conditions other than malignant neoplasm: Secondary | ICD-10-CM

## 2019-06-28 LAB — CBC
Hematocrit: 38.5 % (ref 34.0–46.6)
Hemoglobin: 12.4 g/dL (ref 11.1–15.9)
MCH: 29.8 pg (ref 26.6–33.0)
MCHC: 32.2 g/dL (ref 31.5–35.7)
MCV: 93 fL (ref 79–97)
Platelets: 311 10*3/uL (ref 150–450)
RBC: 4.16 x10E6/uL (ref 3.77–5.28)
RDW: 12 % (ref 11.7–15.4)
WBC: 10.6 10*3/uL (ref 3.4–10.8)

## 2019-06-28 LAB — BASIC METABOLIC PANEL
BUN/Creatinine Ratio: 18 (ref 12–28)
BUN: 15 mg/dL (ref 8–27)
CO2: 23 mmol/L (ref 20–29)
Calcium: 10 mg/dL (ref 8.7–10.3)
Chloride: 98 mmol/L (ref 96–106)
Creatinine, Ser: 0.82 mg/dL (ref 0.57–1.00)
GFR calc Af Amer: 80 mL/min/{1.73_m2} (ref >59)
GFR calc non Af Amer: 69 mL/min/{1.73_m2} (ref >59)
Glucose: 96 mg/dL (ref 65–99)
Potassium: 5.1 mmol/L (ref 3.5–5.2)
Sodium: 135 mmol/L (ref 134–144)

## 2019-06-28 LAB — MAGNESIUM: Magnesium: 1.9 mg/dL (ref 1.6–2.3)

## 2019-07-01 ENCOUNTER — Encounter: Payer: Self-pay | Admitting: *Deleted

## 2019-07-01 ENCOUNTER — Telehealth: Payer: Self-pay | Admitting: Family Medicine

## 2019-07-01 NOTE — Telephone Encounter (Signed)
Pt needs return to work note for today FR

## 2019-07-05 NOTE — Telephone Encounter (Signed)
Attempted to call pt. No answer busy signal

## 2019-07-30 ENCOUNTER — Encounter: Payer: Self-pay | Admitting: Adult Health

## 2019-07-30 ENCOUNTER — Ambulatory Visit: Payer: Medicare Other | Admitting: Adult Health

## 2019-07-30 ENCOUNTER — Other Ambulatory Visit: Payer: Self-pay

## 2019-07-30 VITALS — BP 145/69 | HR 73 | Temp 97.4°F | Ht 63.0 in | Wt 193.2 lb

## 2019-07-30 DIAGNOSIS — E7800 Pure hypercholesterolemia, unspecified: Secondary | ICD-10-CM

## 2019-07-30 DIAGNOSIS — R29818 Other symptoms and signs involving the nervous system: Secondary | ICD-10-CM

## 2019-07-30 DIAGNOSIS — G459 Transient cerebral ischemic attack, unspecified: Secondary | ICD-10-CM | POA: Diagnosis not present

## 2019-07-30 DIAGNOSIS — I10 Essential (primary) hypertension: Secondary | ICD-10-CM | POA: Diagnosis not present

## 2019-07-30 DIAGNOSIS — G43109 Migraine with aura, not intractable, without status migrainosus: Secondary | ICD-10-CM

## 2019-07-30 DIAGNOSIS — F329 Major depressive disorder, single episode, unspecified: Secondary | ICD-10-CM

## 2019-07-30 NOTE — Patient Instructions (Addendum)
Continue aspirin 81 mg daily  and Zetia 10 mg daily for secondary stroke prevention  Continue to follow up with PCP regarding cholesterol and blood pressure management   Referral placed for sleep evaluation -you will be called to schedule initial evaluation and then possibly undergo sleep study  Follow-up with cardiology scheduled on 08/06/2019 and obtain 30-day cardiac event monitor  Follow-up with your PCP in regards to knee and shoulder pain with possibly pursuing aquatic therapy and/or injections  Continue to monitor blood pressure at home  Maintain strict control of hypertension with blood pressure goal below 130/90, diabetes with hemoglobin A1c goal below 6.5% and cholesterol with LDL cholesterol (bad cholesterol) goal below 70 mg/dL. I also advised the patient to eat a healthy diet with plenty of whole grains, cereals, fruits and vegetables, exercise regularly and maintain ideal body weight.  Followup in the future with me in 4 months or call earlier if needed       Thank you for coming to see [place] at [facility]. I hope we have been able to provide you high quality care today.  You may receive a patient satisfaction survey over the next few weeks. We would appreciate your feedback and comments so that we may continue to improve ourselves and the health of our patients.

## 2019-07-30 NOTE — Progress Notes (Signed)
[facility] [address] [place]. [place] [address] [phone]       [place] Date of Birth:  20-Dec-1940 Medical Record Number:  [id number]   Reason for Referral:  hospital stroke follow up    CHIEF COMPLAINT:  Chief Complaint  Patient presents with  . Hospitalization Follow-up    Alone. Treatment room. No new concerns at this time.     HPI: [place] Soosis being seen today for in office hospital follow-up regarding left brain TIA on 06/20/2019.  History obtained from patient and chart review. Reviewed all radiology images and labs personally.  Kerry [name] is a 78 y.o. female with history of HTN, HLD, migraines, endometrial cancer s/p TAH 1987  presented on 06/20/2019 with transient slurred speech with word finding difficulty and right sided numbness.    Stroke work-up completed with symptoms likely related to left brain cortical TIA which appeared embolic in origin.  MRI no acute stroke with evidence of small vessel disease.  MRA negative for LVO with mild arthrosclerosis and bilateral maxillary sinus fluid levels.  Carotid Doppler bilateral ICA 1 to 39% stenosis in VAs antegrade.  2D echo EF of 45 to 50% with some diastolic function but no evidence of cardiac source of embolus..  Lower extremity venous Dopplers negative for DVT.  Recommended loop recorder placement to assess for atrial fibrillation but patient declined and was agreeable to do outpatient cardiac monitoring and recommend follow-up with cardiology outpatient to initiate.  LDL 91.  A1c 5.6.  Recommended DAPT for 3 weeks and aspirin alone.  HTN stable.  Initiated Zetia 10 mg daily with history of statin intolerance.  Other stroke risk factors include new combined systolic/diastolic CHF, advanced age and migraines but no prior history of stroke.  Discharged home in stable condition without therapy needs.  Kerry [name] is a 78 year old female who is being seen today for  hospital follow-up.  Has been stable since discharge without new or reoccurring stroke/TIA symptoms.  She has returned to work at [employer] without difficulty.  She does experience bilateral lower extremity pain and right shoulder pain which appears to be related to arthritis.  She does endorse daytime fatigue where she can easily lay down to take a nap or will fall asleep watching TV.  She does have to sit upright in a recliner while sleeping.  At times she will feel increased shortness of breath with exertion and is unsure if this is related to her asthma.  She does occasionally experience visual aura migraines 2-3 times per year with longstanding history of migraines.  She also experiences occasional double left frontal pain typically occurring with increased anxiety or stress.  Completed 3 weeks DAPT and continues on aspirin alone without bleeding or bruising.  Continues on Zetia without side effects.  Blood pressure today 145/69.  Follow-up with cardiology scheduled on 08/06/2019 to discuss 30-day cardiac event monitor.  Has appointment with a different cardiologist in December with patient reporting to follow-up regarding CHF.  No further concerns at this time.     ROS:   14 system review of systems performed and negative with exception of joint pain, fatigue, shortness of breath, incontinence, aching muscles and allergies  PMH:  Past Medical History:  Diagnosis Date  . Allergy   . Anemia    in her 20's  . Arthritis    not dx'd- just per pt   . Asthma    AS A CHILD  .  Cancer [facility])    endometrial cancer with hysterectomy in 1987  . Cancer ([place])    melanoma on face   . Cataract    removed both eyes   . GERD (gastroesophageal reflux disease)    occ has with diet   . History of syncope   . HTN (hypertension)   . Hyperlipidemia     PSH:  Past Surgical History:  Procedure Laterality Date  . ABDOMINAL HYSTERECTOMY    . ANKLE FRACTURE SURGERY    . APPENDECTOMY    . CATARACT  EXTRACTION, BILATERAL    . CHOLECYSTECTOMY    . COLONOSCOPY  08/01/2007   Dr Henrene Pastor   . DENTAL SURGERY    . right leg  fracture surgery       Social History:  Social History   Socioeconomic History  . Marital status: Widowed    Spouse name: Not on file  . Number of children: 2  . Years of education: Not on file  . Highest education level: Not on file  Occupational History  . Not on file  Social Needs  . Financial resource strain: Not on file  . Food insecurity    Worry: Not on file    Inability: Not on file  . Transportation needs    Medical: Not on file    Non-medical: Not on file  Tobacco Use  . Smoking status: Never Smoker  . Smokeless tobacco: Never Used  Substance and Sexual Activity  . Alcohol use: No  . Drug use: No  . Sexual activity: Not Currently  Lifestyle  . Physical activity    Days per week: Not on file    Minutes per session: Not on file  . Stress: Not on file  Relationships  . Social Herbalist on phone: Not on file    Gets together: Not on file    Attends religious service: Not on file    Active member of club or organization: Not on file    Attends meetings of clubs or organizations: Not on file    Relationship status: Not on file  . Intimate partner violence    Fear of current or ex partner: Not on file    Emotionally abused: Not on file    Physically abused: Not on file    Forced sexual activity: Not on file  Other Topics Concern  . Not on file  Social History Narrative  . Not on file    Family History:  Family History  Problem Relation Age of Onset  . Mitral valve prolapse Daughter   . Heart disease Mother   . Kidney failure Mother   . Diabetes Mother   . Cancer Mother        uterine  . Hyperlipidemia Mother   . Hypertension Mother   . Uterine cancer Mother   . Dementia Father   . Diabetes Sister   . Diabetes Paternal Grandmother   . Throat cancer Brother   . Colon cancer Neg Hx   . Colon polyps Neg Hx   .  Esophageal cancer Neg Hx   . Rectal cancer Neg Hx   . Stomach cancer Neg Hx     Medications:   Current Outpatient Medications on File Prior to Visit  Medication Sig Dispense Refill  . acetaminophen (TYLENOL) 500 MG tablet Take 500 mg by mouth every 6 (six) hours as needed for moderate pain or headache.     . albuterol (VENTOLIN HFA) 108 (90 Base) MCG/ACT  inhaler Inhale 2 puffs into the lungs every 6 (six) hours as needed for wheezing or shortness of breath. (Patient taking differently: Inhale 2 puffs into the lungs as needed for wheezing or shortness of breath. ) 1 Inhaler 0  . Ascorbic Acid (VITAMIN C PO) Take 1 tablet by mouth daily.    Marland Kitchen aspirin 81 MG chewable tablet Chew 81 mg by mouth daily. Every other day per pt    . Cholecalciferol (VITAMIN D3) 2000 units capsule Take 2,000 Units by mouth daily.    . Coenzyme Q10 (CO Q 10 PO) Take 1 capsule by mouth daily.     Marland Kitchen ezetimibe (ZETIA) 10 MG tablet Take 1 tablet (10 mg total) by mouth daily. 90 tablet 0  . fexofenadine (ALLEGRA) 180 MG tablet Take 180 mg by mouth daily.    . hydrochlorothiazide (HYDRODIURIL) 25 MG tablet TAKE 1 TABLET(25 MG) BY MOUTH DAILY (Patient taking differently: Take 25 mg by mouth daily. ) 90 tablet 1  . lisinopril (PRINIVIL,ZESTRIL) 20 MG tablet TAKE 1 TABLET BY MOUTH EVERY DAY (Patient taking differently: Take 20 mg by mouth daily. ) 90 tablet 3  . metoprolol succinate (TOPROL-XL) 25 MG 24 hr tablet TAKE 1 TABLET BY MOUTH TWICE DAILY (Patient taking differently: Take 25 mg by mouth 2 (two) times daily. ** DO NOT CRUSH **    (BETA BLOCKER)) 180 tablet 3  . Multiple Vitamin (MULTIVITAMIN) tablet Take 1 tablet by mouth daily.      . montelukast (SINGULAIR) 10 MG tablet Take 1 tablet (10 mg total) by mouth at bedtime. (Patient not taking: Reported on 07/30/2019) 90 tablet 1   No current facility-administered medications on file prior to visit.     Allergies:   Allergies  Allergen Reactions  . Lovastatin Other (See  Comments)    myalgias  . Sudafed [Pseudoephedrine Hcl] Other (See Comments)    Hallucinations      Physical Exam  Vitals:   07/30/19 0759  BP: (!) 145/69  Pulse: 73  Temp: (!) 97.4 F (36.3 C)  TempSrc: Oral  Weight: 193 lb 3.2 oz (87.6 kg)  Height: 5\' 3"  (1.6 m)   Body mass index is 34.22 kg/m. No exam data present  Depression screen [facility] 2/9 07/30/2019  Decreased Interest 0  Down, Depressed, Hopeless 0  PHQ - 2 Score 0     General: well developed, well nourished, pleasant elderly Caucasian female, seated, in no evident distress Head: head normocephalic and atraumatic.   Neck: supple with no carotid or supraclavicular bruits Cardiovascular: regular rate and rhythm, no murmurs Musculoskeletal: no deformity Skin:  no rash/petichiae Vascular:  Normal pulses all extremities   Neurologic Exam Mental Status: Awake and fully alert. Oriented to place and time. Recent and remote memory intact. Attention span, concentration and fund of knowledge appropriate. Mood and affect appropriate.  Cranial Nerves: Fundoscopic exam reveals sharp disc margins. Pupils equal, briskly reactive to light. Extraocular movements full without nystagmus. Visual fields full to confrontation. Hearing intact. Facial sensation intact. Face, tongue, palate moves normally and symmetrically.  Motor: Normal bulk and tone. Normal strength in all tested extremity muscles. Sensory.: intact to touch , pinprick , position and vibratory sensation.  Coordination: Rapid alternating movements normal in all extremities. Finger-to-nose and heel-to-shin performed accurately bilaterally. Gait and Station: Arises from chair without difficulty. Stance is normal. Gait demonstrates normal stride length and balance Reflexes: 1+ and symmetric. Toes downgoing.     NIHSS  0 Modified Rankin  0  Diagnostic Data (Labs, Imaging, Testing)  CT HEAD WO CONTRAST 06/20/2019 IMPRESSION: 1. No acute abnormality. 2. Minimal  diffuse cerebral and cerebellar atrophy with minimal progression. 3. Acute bilateral maxillary sinusitis.  MR BRAIN WO CONTRAST 06/20/2019 IMPRESSION: 1. No acute intracranial abnormality. 2. Mild chronic small vessel disease.  MR MRA HEAD  06/21/2019 IMPRESSION: 1. No emergent finding. 2. Mild atheromatous changes. 3. Maxillary sinus fluid levels on both sides.  ECHOCARDIOGRAM 06/21/2019 IMPRESSIONS  1. Left ventricular ejection fraction, by visual estimation, is 45 to 50%. The left ventricle has mildly decreased function. Mildly increased left ventricular size. There is no left ventricular hypertrophy.  2. Left ventricular diastolic Doppler parameters are consistent with impaired relaxation pattern of LV diastolic filling.  3. Global right ventricle has normal systolic function.The right ventricular size is mildly enlarged. No increase in right ventricular wall thickness.  4. Left atrial size was mildly dilated.  5. Right atrial size was mildly dilated.  6. The pericardial effusion is anterior to the right ventricle.  7. Trivial pericardial effusion is present.  8. The mitral valve is normal in structure. No evidence of mitral valve regurgitation. No evidence of mitral stenosis.  9. The tricuspid valve is normal in structure. Tricuspid valve regurgitation is trivial. 10. The aortic valve is normal in structure. Aortic valve regurgitation is mild by color flow Doppler. Mild aortic valve stenosis. 11. The pulmonic valve was normal in structure. Pulmonic valve regurgitation is not visualized by color flow Doppler. 12. Normal pulmonary artery systolic pressure. 13. The tricuspid regurgitant velocity is 2.52 m/s, and with an assumed right atrial pressure of 3 mmHg, the estimated right ventricular systolic pressure is normal at 28.4 mmHg. 14. The inferior vena cava is normal in size with greater than 50% respiratory variability, suggesting right atrial pressure of 3 mmHg. 15. No embolic  source identified.  VAS [place] LOWER EXTREMITY VENOUS BILATERAL 06/21/2019 Summary: Right: There is no evidence of deep vein thrombosis in the lower extremity. No cystic structure found in the popliteal fossa. Left: There is no evidence of deep vein thrombosis in the lower extremity. A cystic structure is found in the popliteal fossa.  VAS US CAROTID DUPLEX BILATERAL 06/21/2019 Summary: Right Carotid: Velocities in the right ICA are consistent with a 1-39% stenosis. Left Carotid: Velocities in the left ICA are consistent with a 1-39% stenosis. Vertebrals: Bilateral vertebral arteries demonstrate antegrade flow.    ASSESSMENT: Kerry [name] is a 78 y.o. year old female presented with slurred speech with word finding difficulty and right-sided numbness on 06/20/2019 with stroke work-up completed and symptoms likely related to left brain cortical TIA possibly embolic in origin.  Recommended 30-day cardiac event monitor as patient declined loop recorder placement and will be following with cardiology next week.  Vascular risk factors include new diagnosis of CHF, HTN, HLD and migraines.  Overall stable from a neurological standpoint.  Symptoms of daytime fatigue and napping facility with suspected sleep apnea.    PLAN:  1. TIA: Continue aspirin 81 mg daily  and Zetia for secondary stroke prevention.  Follow-up with cardiology next week to undergo 30-day cardiac event monitor to assess for potential atrial fibrillation as TIA etiology.  Maintain strict control of hypertension with blood pressure goal below 130/90, diabetes with hemoglobin A1c goal below 6.5% and cholesterol with LDL cholesterol (bad cholesterol) goal below 70 mg/dL.  I also advised the patient to eat a healthy diet with plenty of whole grains, cereals, fruits and vegetables, exercise regularly with at  least 30 minutes of continuous activity daily and maintain ideal body weight. 2. HTN: Advised to continue current treatment regimen.   Today's BP 145/69.  Does not currently monitor at home and highly encouraged to do so with ongoing follow-up with PCP 3. HLD: Advised to continue current treatment regimen along with continued follow-up with PCP for future prescribing and monitoring of lipid panel 4. Suspected sleep apnea: High risk of sleep apnea with daytime fatigue, snoring, and obesity.  New diagnosis of CHF and treatment for HTN.  Referral placed for sleep apnea evaluation 5. New dx CHF: Scheduled follow-up visit with cardiology.  Complains of shortness of breath with exertion and was advised that could potentially be due to CHF but after cardiology follow-up visit, if symptoms not related to cardiac recommend following up with PCP for potential treatment of asthma 6. Visual aura migraines: Currently stable and no indication for treatment.  Advised to continue to follow with PCP 7. Possible depression: Does report occasional decreased mood but is not interested in any type of medication management or therapy at this time.  Increase stress and anxiety causing likely tension headaches and discussion regarding stress reduction techniques 8. Likely arthritis: Bilateral knee pain with improvement with activity.  Advised to follow-up with PCP for potential participation in aquatic therapy or referral for potential injections as pain can limit daily functioning    Follow up in 4 months or call earlier if needed   Greater than 50% of time during this 45 minute visit was spent on counseling, explanation of diagnosis of TIA, reviewing risk factor management of HTN, HLD, suspected sleep apnea, migraines and new diagnosis CHF, planning of further management along with potential future management, and discussion with patient and family answering all questions.    Frann Rider, AGNP-BC  [facility] [address] [place], [place] [phone]  Phone [phone] Fax [phone] Note: This document was  prepared with digital dictation and possible smart phrase technology. Any transcriptional errors that result from this process are unintentional.

## 2019-07-30 NOTE — Progress Notes (Signed)
I agree with the above plan 

## 2019-08-06 ENCOUNTER — Ambulatory Visit: Payer: Medicare Other | Admitting: Student

## 2019-08-06 ENCOUNTER — Other Ambulatory Visit: Payer: Self-pay

## 2019-08-06 VITALS — BP 154/76 | HR 75 | Ht 63.0 in | Wt 192.0 lb

## 2019-08-06 DIAGNOSIS — R002 Palpitations: Secondary | ICD-10-CM

## 2019-08-06 DIAGNOSIS — I639 Cerebral infarction, unspecified: Secondary | ICD-10-CM

## 2019-08-06 MED ORDER — CARVEDILOL 12.5 MG PO TABS: 12.5000 mg | ORAL_TABLET | Freq: Two times a day (BID) | ORAL | 3 refills | Status: DC

## 2019-08-06 NOTE — Patient Instructions (Addendum)
Medication Instructions:   STOP TAKING: TOPROL XL   START TAKING : CARVEDILOL ( COREG) 12.5 MG TWICE A DAY   *If you need a refill on your cardiac medications before your next appointment, please call your pharmacy*  Lab Work:  NONE ORDERED  TODAY   If you have labs (blood work) drawn today and your tests are completely normal, you will receive your results only by: Marland Kitchen MyChart Message (if you have MyChart) OR . A paper copy in the mail If you have any lab test that is abnormal or we need to change your treatment, we will call you to review the results.  Testing/Procedures: Your physician has recommended that you wear an event monitor. Event monitors are medical devices that record the heart's electrical activity. Doctors most often [place] these monitors to diagnose arrhythmias. Arrhythmias are problems with the speed or rhythm of the heartbeat. The monitor is a small, portable device. You can wear one while you do your normal daily activities. This is usually used to diagnose what is causing palpitations/syncope (passing out).    Follow-Up: At [facility], you and your health needs are our priority.  As part of our continuing mission to provide you with exceptional heart care, we have created designated Provider Care Teams.  These Care Teams include your primary Cardiologist (physician) and Advanced Practice Providers (APPs -  Physician Assistants and Nurse Practitioners) who all work together to provide you with the care you need, when you need it.  Your next appointment:   2 month(s)  The format for your next appointment:   In Person  Provider:  Legrand Como "Jonni Sanger" Chalmers Cater, PA-C   Other Instructions  ZIO XT- Long Term Monitor Instructions   Your physician has requested you wear your ZIO patch monitor__14_____days.   This is a single patch monitor.  [employer] supplies one patch monitor per enrollment.  Additional stickers are not available.   Please do not apply patch if you will be  having a Nuclear Stress Test, Echocardiogram, Cardiac CT, MRI, or Chest Xray during the time frame you would be wearing the monitor. The patch cannot be worn during these tests.  You cannot remove and re-apply the ZIO XT patch monitor.   Your ZIO patch monitor will be sent USPS Priority mail from [facility] directly to your home address. The monitor may also be mailed to a PO BOX if home delivery is not available.   It may take 3-5 days to receive your monitor after you have been enrolled.   Once you have received you monitor, please review enclosed instructions.  Your monitor has already been registered assigning a specific monitor serial # to you.   Applying the monitor   Shave hair from upper left chest.   Hold abrader disc by orange tab.  Rub abrader in 40 strokes over left upper chest as indicated in your monitor instructions.   Clean area with 4 enclosed alcohol pads .  Use all pads to assure are is cleaned thoroughly.  Let dry.   Apply patch as indicated in monitor instructions.  Patch will be place under collarbone on left side of chest with arrow pointing upward.   Rub patch adhesive wings for 2 minutes.Remove white label marked "1".  Remove white label marked "2".  Rub patch adhesive wings for 2 additional minutes.   While looking in a mirror, press and release button in center of patch.  A small green light will flash 3-4 times .  This will  be your only indicator the monitor has been turned on.     Do not shower for the first 24 hours.  You may shower after the first 24 hours.   Press button if you feel a symptom. You will hear a small click.  Record Date, Time and Symptom in the Patient Log Book.   When you are ready to remove patch, follow instructions on last 2 pages of Patient Log Book.  Stick patch monitor onto last page of Patient Log Book.   Place Patient Log Book in [place] box.  Use locking tab on box and tape box closed securely.  The [employer] has [employer] on it.  Please place in mailbox as soon as possible.  Your physician should have your test results approximately 7 days after the monitor has been mailed back to [facility].   Call [place] at [phone] if you have questions regarding your ZIO XT patch monitor.  Call them immediately if you see an orange light blinking on your monitor.   If your monitor falls off in less than 4 days contact our Monitor department at [phone].  If your monitor becomes loose or falls off after 4 days call [employer] at [phone] for suggestions on securing your monitor.

## 2019-08-06 NOTE — Progress Notes (Signed)
PCP:  Forrest Moron, MD Primary Cardiologist: Dr. Radford Pax Electrophysiologist: Dr. Larey [name] is a 78 y.o. female with past medical history of cryptogenic stroke who presents today for post hospital follow up and to discuss Loop placement.  They are seen for Dr. Lovena Le.   Since last being seen in our clinic, the patient reports doing very well.    She is back to work, and does not have any deficits from her stroke.   She spoke with her insurance and thinks the coverage for a 30 day monitor would be reasonable.  At this time, she is still not interested in moving straight to Loop recorder. Her BP at home runs ~ [id number] systolic.  She gets SOB in the am getting dressing, but otherwise does her ADLs without difficulty.  She does feel like she has occasional palpitations with no clear aggravating or relieving factors.   The patient feels that she is tolerating medications without difficulties and is otherwise without complaint today.   Past Medical History:  Diagnosis Date  . Allergy   . Anemia    in her 20's  . Arthritis    not dx'd- just per pt   . Asthma    AS A CHILD  . Cancer [facility])    endometrial cancer with hysterectomy in 1987  . Cancer ([place])    melanoma on face   . Cataract    removed both eyes   . GERD (gastroesophageal reflux disease)    occ has with diet   . History of syncope   . HTN (hypertension)   . Hyperlipidemia    Past Surgical History:  Procedure Laterality Date  . ABDOMINAL HYSTERECTOMY    . ANKLE FRACTURE SURGERY    . APPENDECTOMY    . CATARACT EXTRACTION, BILATERAL    . CHOLECYSTECTOMY    . COLONOSCOPY  08/01/2007   Dr Henrene Pastor   . DENTAL SURGERY    . right leg  fracture surgery       Current Outpatient Medications  Medication Sig Dispense Refill  . acetaminophen (TYLENOL) 500 MG tablet Take 500 mg by mouth every 6 (six) hours as needed for moderate pain or headache.     . albuterol (VENTOLIN HFA) 108 (90 Base) MCG/ACT inhaler Inhale 2  puffs into the lungs every 6 (six) hours as needed for wheezing or shortness of breath. (Patient taking differently: Inhale 2 puffs into the lungs as needed for wheezing or shortness of breath. ) 1 Inhaler 0  . Ascorbic Acid (VITAMIN C PO) Take 1 tablet by mouth daily.    Marland Kitchen aspirin 81 MG chewable tablet Chew 81 mg by mouth daily. Every other day per pt    . Cholecalciferol (VITAMIN D3) 2000 units capsule Take 2,000 Units by mouth daily.    . Coenzyme Q10 (CO Q 10 PO) Take 1 capsule by mouth daily.     Marland Kitchen ezetimibe (ZETIA) 10 MG tablet Take 1 tablet (10 mg total) by mouth daily. 90 tablet 0  . fexofenadine (ALLEGRA) 180 MG tablet Take 180 mg by mouth daily.    . hydrochlorothiazide (HYDRODIURIL) 25 MG tablet TAKE 1 TABLET(25 MG) BY MOUTH DAILY (Patient taking differently: Take 25 mg by mouth daily. ) 90 tablet 1  . lisinopril (PRINIVIL,ZESTRIL) 20 MG tablet TAKE 1 TABLET BY MOUTH EVERY DAY (Patient taking differently: Take 20 mg by mouth daily. ) 90 tablet 3  . metoprolol succinate (TOPROL-XL) 25 MG 24 hr tablet TAKE 1 TABLET  BY MOUTH TWICE DAILY (Patient taking differently: Take 25 mg by mouth 2 (two) times daily. ** DO NOT CRUSH **    (BETA BLOCKER)) 180 tablet 3  . Multiple Vitamin (MULTIVITAMIN) tablet Take 1 tablet by mouth daily.       No current facility-administered medications for this visit.     Allergies  Allergen Reactions  . Lovastatin Other (See Comments)    myalgias  . Sudafed [Pseudoephedrine Hcl] Other (See Comments)    Hallucinations     Social History   Socioeconomic History  . Marital status: Widowed    Spouse name: Not on file  . Number of children: 2  . Years of education: Not on file  . Highest education level: Not on file  Occupational History  . Not on file  Social Needs  . Financial resource strain: Not on file  . Food insecurity    Worry: Not on file    Inability: Not on file  . Transportation needs    Medical: Not on file    Non-medical: Not on file   Tobacco Use  . Smoking status: Never Smoker  . Smokeless tobacco: Never Used  Substance and Sexual Activity  . Alcohol use: No  . Drug use: No  . Sexual activity: Not Currently  Lifestyle  . Physical activity    Days per week: Not on file    Minutes per session: Not on file  . Stress: Not on file  Relationships  . Social Herbalist on phone: Not on file    Gets together: Not on file    Attends religious service: Not on file    Active member of club or organization: Not on file    Attends meetings of clubs or organizations: Not on file    Relationship status: Not on file  . Intimate partner violence    Fear of current or ex partner: Not on file    Emotionally abused: Not on file    Physically abused: Not on file    Forced sexual activity: Not on file  Other Topics Concern  . Not on file  Social History Narrative  . Not on file   Review of Systems: General: No chills, fever, night sweats or weight changes  Cardiovascular:  No chest pain, dyspnea on exertion, edema, orthopnea, palpitations, paroxysmal nocturnal dyspnea Dermatological: No rash, lesions or masses Respiratory: No cough, dyspnea Urologic: No hematuria, dysuria Abdominal: No nausea, vomiting, diarrhea, bright red blood per rectum, melena, or hematemesis Neurologic: No visual changes, weakness, changes in mental status All other systems reviewed and are otherwise negative except as noted above.  Physical Exam: Vitals:   08/06/19 1013  BP: (!) 154/76  Pulse: 75  SpO2: 95%  Weight: 192 lb (87.1 kg)  Height: 5\' 3"  (1.6 m)    GEN- The patient is well appearing, alert and oriented x 3 today.   HEENT: normocephalic, atraumatic; sclera clear, conjunctiva pink; hearing intact; oropharynx clear; neck supple, no JVP Lymph- no cervical lymphadenopathy Lungs- Clear to ausculation bilaterally, normal work of breathing.  No wheezes, rales, rhonchi Heart- Regular rate and rhythm, no murmurs, rubs or gallops,  PMI not laterally displaced GI- soft, non-tender, non-distended, bowel sounds present, no hepatosplenomegaly Extremities- no clubbing, cyanosis, or edema; DP/PT/radial pulses 2+ bilaterally MS- no significant deformity or atrophy Skin- warm and dry, no rash or lesion Psych- euthymic mood, full affect Neuro- strength and sensation are intact  EKG is not ordered. Personal review of EKG  from admission shows NSR at 76 bpm  Assessment and Plan:  1. Cryptogenic stroke Echocardiogram 06/21/2019 showed borderline EF at 45-50%.  In the hospital she had transient aphasia and right sided numbness; These have resolved. Carotid dopplers 06/21/2019 were negative DVT US 06/21/2019 were negative She does have a history of palpitations Will order 30 day event monitor for cryptogenic stroke and palpitation.  Will see back in 2 months to review results.   2. HTN Change metoprolol to coreg 12.5 mg BID with better HTN coverage  3. Chronic combined CHF, EF borderline Echo 06/21/19 borderline as above.  NYHA III symptoms Volume status stable on exam Switching to coreg as above for BP coverage.  Continue lisinopril 20 mg daily, not Entresto candidate with EF >40%.  Consider switching HCTZ to spirinolactone.  RTC 2 months (due to holidays) to discuss results of event monitor and possible loop.  Kerry Friar, PA-C  08/06/19 10:18 AM

## 2019-08-12 ENCOUNTER — Encounter: Payer: Self-pay | Admitting: *Deleted

## 2019-08-12 NOTE — Progress Notes (Signed)
Patient ID: Kerry [name], female   DOB: 1941-02-01, 78 y.o.   MRN: [id number]  [employer] to mail a 3 day ZIO XT long term holter monitor to the patients home.

## 2019-08-23 ENCOUNTER — Ambulatory Visit (INDEPENDENT_AMBULATORY_CARE_PROVIDER_SITE_OTHER): Payer: Medicare Other

## 2019-08-23 DIAGNOSIS — R002 Palpitations: Secondary | ICD-10-CM | POA: Diagnosis not present

## 2019-08-23 DIAGNOSIS — I639 Cerebral infarction, unspecified: Secondary | ICD-10-CM | POA: Diagnosis not present

## 2019-08-26 ENCOUNTER — Encounter: Payer: Self-pay | Admitting: Cardiology

## 2019-08-26 ENCOUNTER — Other Ambulatory Visit: Payer: Self-pay

## 2019-08-26 ENCOUNTER — Ambulatory Visit: Payer: Medicare Other | Admitting: Cardiology

## 2019-08-26 VITALS — BP 126/54 | HR 76 | Ht 63.0 in | Wt 194.0 lb

## 2019-08-26 DIAGNOSIS — Z87898 Personal history of other specified conditions: Secondary | ICD-10-CM

## 2019-08-26 DIAGNOSIS — I10 Essential (primary) hypertension: Secondary | ICD-10-CM | POA: Diagnosis not present

## 2019-08-26 DIAGNOSIS — I351 Nonrheumatic aortic (valve) insufficiency: Secondary | ICD-10-CM

## 2019-08-26 DIAGNOSIS — I420 Dilated cardiomyopathy: Secondary | ICD-10-CM

## 2019-08-26 DIAGNOSIS — E7800 Pure hypercholesterolemia, unspecified: Secondary | ICD-10-CM

## 2019-08-26 DIAGNOSIS — R0602 Shortness of breath: Secondary | ICD-10-CM

## 2019-08-26 MED ORDER — METOPROLOL TARTRATE 100 MG PO TABS: 100.0000 mg | ORAL_TABLET | Freq: Once | ORAL | 0 refills | Status: DC

## 2019-08-26 NOTE — Patient Instructions (Signed)
Medication Instructions:  Your physician recommends that you continue on your current medications as directed. Please refer to the Current Medication list given to you today.  *If you need a refill on your cardiac medications before your next appointment, please call your pharmacy*  Lab Work: You will have a BMET drawn prior to your CT scan.  If you have labs (blood work) drawn today and your tests are completely normal, you will receive your results only by: Marland Kitchen MyChart Message (if you have MyChart) OR . A paper copy in the mail If you have any lab test that is abnormal or we need to change your treatment, we will call you to review the results.  Testing/Procedures: Your physician has requested that you have an echocardiogram. Echocardiography is a painless test that uses sound waves to create images of your heart. It provides your doctor with information about the size and shape of your heart and how well your heart's chambers and valves are working. This procedure takes approximately one hour. There are no restrictions for this procedure.  Cardiac CT Angiography (CTA), is a special type of CT scan that uses a computer to produce multi-dimensional views of major blood vessels throughout the body. In CT angiography, a contrast material is injected through an IV to help visualize the blood vessels  Follow-Up: At [facility], you and your health needs are our priority.  As part of our continuing mission to provide you with exceptional heart care, we have created designated Provider Care Teams.  These Care Teams include your primary Cardiologist (physician) and Advanced Practice Providers (APPs -  Physician Assistants and Nurse Practitioners) who all work together to provide you with the care you need, when you need it.  Your next appointment:   1 year(s)  The format for your next appointment:   Either In Person or Virtual  Provider:   Fransico Him, MD  Other Instructions  Your cardiac  CT will be scheduled at one of the below locations:   [facility] [address] [place], [place] [address] [phone]  [place] [address] [place], [place] [address] [phone]  If scheduled at [facility], please arrive at the [facility] main entrance of [facility] 30-45 minutes prior to test start time. Proceed to the [facility] Radiology Department (first floor) to check-in and test prep.  If scheduled at [facility], please arrive 15 mins early for check-in and test prep.  Please follow these instructions carefully (unless otherwise directed):  On the Night Before the Test: . Be sure to Drink plenty of water. . Do not consume any caffeinated/decaffeinated beverages or chocolate 12 hours prior to your test. . Do not take any antihistamines 12 hours prior to your test.  On the Day of the Test: . Drink plenty of water. Do not drink any water within one hour of the test. . Do not eat any food 4 hours prior to the test. . You may take your regular medications prior to the test.  . Take metoprolol (Lopressor) two hours prior to test. . HOLD Hydrochlorothiazide and Carvedilol morning of the test. . FEMALES- please wear underwire-free bra if available       After the Test: . Drink plenty of water. . After receiving IV contrast, you may experience a mild flushed feeling. This is normal. . On occasion, you may experience a mild rash up to 24 hours after  the test. This is not dangerous. If this occurs, you can take Benadryl 25 mg and increase your fluid intake. . If you experience trouble breathing, this can be serious. If it is severe call 911 IMMEDIATELY. If it is mild, please call our office. . If you take any of these medications: Glipizide/Metformin, Avandament, Glucavance, please do not take 48 hours after completing test unless otherwise instructed.   Once  we have confirmed authorization from your insurance company, we will call you to set up a date and time for your test.   For non-scheduling related questions, please contact the cardiac imaging nurse navigator should you have any questions/concerns: Marchia Bond, RN Navigator Cardiac Imaging [facility] [phone] Office

## 2019-08-26 NOTE — Progress Notes (Signed)
Cardiology Office Note:    Date:  08/26/2019   ID:  Kerry [name], DOB 11/02/1940, MRN [id number]  PCP:  Kerry Moron, MD  Cardiologist:  No primary care provider on file.    Referring MD: Kerry Moron, MD   Chief Complaint  Patient presents with  . Follow-up    Syncope, HTN, HLD    History of Present Illness:    Kerry [name] is a 78 y.o. female with a hx of HTN, syncope and hyperlipidemia (statin intolerant). Since I saw her last she had a CVA in October and loop recorder was recommended but patient did not want to proceed.   2D echo at that time showed mild LV dysfunction with EF 45-50% which was new and mild AS/AI.    She is here today for followup and is doing well.  She denies any chest pain or pressure, PND, orthopnea,  palpitations or syncope. Her biggest complaint is that she is chronically SOB.  This occurs with any exertional activity.  It seems to have increased in severity since I saw her last.  She has occasional chronic LE edema in the leg she sustained an injury in in the past. She has had 1 dizzy spell recently.  She apparently was walking with her mask on and became hot and woozy and had to sit down until it passed.   She is compliant with her meds and is tolerating meds with no SE.    Past Medical History:  Diagnosis Date  . Allergy   . Anemia    in her 20's  . Arthritis    not dx'd- just per pt   . Asthma    AS A CHILD  . Cancer [facility])    endometrial cancer with hysterectomy in 1987  . Cancer ([place])    melanoma on face   . Cataract    removed both eyes   . GERD (gastroesophageal reflux disease)    occ has with diet   . History of syncope   . HTN (hypertension)   . Hyperlipidemia     Past Surgical History:  Procedure Laterality Date  . ABDOMINAL HYSTERECTOMY    . ANKLE FRACTURE SURGERY    . APPENDECTOMY    . CATARACT EXTRACTION, BILATERAL    . CHOLECYSTECTOMY    . COLONOSCOPY  08/01/2007   Dr Henrene Pastor   . DENTAL SURGERY    . right leg   fracture surgery       Current Medications: Current Meds  Medication Sig  . acetaminophen (TYLENOL) 500 MG tablet Take 500 mg by mouth every 6 (six) hours as needed for moderate pain or headache.   . albuterol (VENTOLIN HFA) 108 (90 Base) MCG/ACT inhaler Inhale 2 puffs into the lungs every 6 (six) hours as needed for wheezing or shortness of breath. (Patient taking differently: Inhale 2 puffs into the lungs as needed for wheezing or shortness of breath. )  . Ascorbic Acid (VITAMIN C PO) Take 1 tablet by mouth daily.  Marland Kitchen aspirin 81 MG chewable tablet Chew 81 mg by mouth daily. Every other day per pt  . carvedilol (COREG) 12.5 MG tablet Take 1 tablet (12.5 mg total) by mouth 2 (two) times daily.  . Cholecalciferol (VITAMIN D3) 2000 units capsule Take 2,000 Units by mouth daily.  . Coenzyme Q10 (CO Q 10 PO) Take 1 capsule by mouth daily.   Marland Kitchen ezetimibe (ZETIA) 10 MG tablet Take 1 tablet (10 mg total) by mouth daily.  Marland Kitchen  fexofenadine (ALLEGRA) 180 MG tablet Take 180 mg by mouth daily.  . hydrochlorothiazide (HYDRODIURIL) 25 MG tablet TAKE 1 TABLET(25 MG) BY MOUTH DAILY (Patient taking differently: Take 25 mg by mouth daily. )  . lisinopril (PRINIVIL,ZESTRIL) 20 MG tablet TAKE 1 TABLET BY MOUTH EVERY DAY (Patient taking differently: Take 20 mg by mouth daily. )  . Multiple Vitamin (MULTIVITAMIN) tablet Take 1 tablet by mouth daily.       Allergies:   Lovastatin and Sudafed [pseudoephedrine hcl]   Social History   Socioeconomic History  . Marital status: Widowed    Spouse name: Not on file  . Number of children: 2  . Years of education: Not on file  . Highest education level: Not on file  Occupational History  . Not on file  Tobacco Use  . Smoking status: Never Smoker  . Smokeless tobacco: Never Used  Substance and Sexual Activity  . Alcohol use: No  . Drug use: No  . Sexual activity: Not Currently  Other Topics Concern  . Not on file  Social History Narrative  . Not on file    Social Determinants of Health   Financial Resource Strain:   . Difficulty of Paying Living Expenses: Not on file  Food Insecurity:   . Worried About Charity fundraiser in the Last Year: Not on file  . Ran Out of Food in the Last Year: Not on file  Transportation Needs:   . Lack of Transportation (Medical): Not on file  . Lack of Transportation (Non-Medical): Not on file  Physical Activity:   . Days of Exercise per Week: Not on file  . Minutes of Exercise per Session: Not on file  Stress:   . Feeling of Stress : Not on file  Social Connections:   . Frequency of Communication with Friends and Family: Not on file  . Frequency of Social Gatherings with Friends and Family: Not on file  . Attends Religious Services: Not on file  . Active Member of Clubs or Organizations: Not on file  . Attends Archivist Meetings: Not on file  . Marital Status: Not on file     Family History: The patient's family history includes Cancer in her mother; Dementia in her father; Diabetes in her mother, paternal grandmother, and sister; Heart disease in her mother; Hyperlipidemia in her mother; Hypertension in her mother; Kidney failure in her mother; Mitral valve prolapse in her daughter; Throat cancer in her brother; Uterine cancer in her mother. There is no history of Colon cancer, Colon polyps, Esophageal cancer, Rectal cancer, or Stomach cancer.  ROS:   Please see the history of present illness.    ROS  All other systems reviewed and negative.   EKGs/Labs/Other Studies Reviewed:    The following studies were reviewed today:   EKG:  EKG is not ordered today.   Recent Labs: 06/20/2019: ALT 21 06/27/2019: BUN 15; Creatinine, Ser 0.82; Hemoglobin 12.4; Magnesium 1.9; Platelets 311; Potassium 5.1; Sodium 135   Recent Lipid Panel    Component Value Date/Time   CHOL 163 06/21/2019 0531   CHOL 161 02/05/2019 0928   TRIG 83 06/21/2019 0531   HDL 55 06/21/2019 0531   HDL 50 02/05/2019  0928   CHOLHDL 3.0 06/21/2019 0531   VLDL 17 06/21/2019 0531   LDLCALC 91 06/21/2019 0531   LDLCALC 88 02/05/2019 0928   LDLDIRECT 140.3 06/01/2011 1019    Physical Exam:    VS:  BP (!) 126/54  Pulse 76   Ht 5\' 3"  (1.6 m)   Wt 194 lb (88 kg)   SpO2 97%   BMI 34.37 kg/m     Wt Readings from Last 3 Encounters:  08/26/19 194 lb (88 kg)  08/06/19 192 lb (87.1 kg)  07/30/19 193 lb 3.2 oz (87.6 kg)     GEN:  Well nourished, well developed in no acute distress HEENT: Normal NECK: No JVD; No carotid bruits LYMPHATICS: No lymphadenopathy CARDIAC: RRR, no murmurs, rubs, gallops RESPIRATORY:  Clear to auscultation without rales, wheezing or rhonchi  ABDOMEN: Soft, non-tender, non-distended MUSCULOSKELETAL:  No edema; No deformity  SKIN: Warm and dry NEUROLOGIC:  Alert and oriented x 3 PSYCHIATRIC:  Normal affect   ASSESSMENT:    1. Essential hypertension   2. History of syncope   3. Pure hypercholesterolemia   4. Nonrheumatic aortic valve insufficiency   5. DCM (dilated cardiomyopathy) ([place])   6. Shortness of breath    PLAN:    In order of problems listed above:  1.  HTN  -BP is controlled -continue Lisinopril 20mg  daily, HCTZ 25mg  daily and Carvedilol 12.5mg  BID -creatinine was stable at 0.82 in October  2.  Syncope -she has not had any further syncope  -she did have a CVA in October and is wearing a Pharmacist, community - she was not interested in loop recorder  3.  Hyperlipidemia -she is statin intolerant -continue zetia 10mg  daily -LDL was 88 in May  4.  Aortic insufficiency/stenosis - 2D echo 06/2019 showed mild AI and mild AS  5.  Mild LV dysfunction -EF noted to be mildly decreased at 45-50% in October at time of her CVA -I will repeat a limited echo to reassess  6.  SOB -This is chronic but seems to have increased in severity recently -given her mild LV dysfunction on last echo, I feel we need to rule out CAD. -Coronary CTA to define coronary  anatomy   Medication Adjustments/Labs and Tests Ordered: Current medicines are reviewed at length with the patient today.  Concerns regarding medicines are outlined above.  Orders Placed This Encounter  Procedures  . CT CORONARY MORPH W/CTA COR W/SCORE W/CA W/CM &/OR WO/CM  . CT CORONARY FRACTIONAL FLOW RESERVE DATA PREP  . CT CORONARY FRACTIONAL FLOW RESERVE FLUID ANALYSIS  . Basic Metabolic Panel (BMET)  . ECHOCARDIOGRAM COMPLETE   Meds ordered this encounter  Medications  . metoprolol tartrate (LOPRESSOR) 100 MG tablet    Sig: Take 1 tablet (100 mg total) by mouth once for 1 dose. Take one tablet 2 hours prior to CT.    Dispense:  1 tablet    Refill:  0    Signed, Fransico Him, MD  08/26/2019 10:17 AM    [place]

## 2019-08-27 ENCOUNTER — Other Ambulatory Visit: Payer: Self-pay | Admitting: Cardiology

## 2019-09-02 ENCOUNTER — Other Ambulatory Visit: Payer: Self-pay | Admitting: Cardiology

## 2019-09-02 MED ORDER — HYDROCHLOROTHIAZIDE 25 MG PO TABS: 25.0000 mg | ORAL_TABLET | Freq: Every day | ORAL | 3 refills | Status: DC

## 2019-09-09 ENCOUNTER — Encounter (INDEPENDENT_AMBULATORY_CARE_PROVIDER_SITE_OTHER): Payer: Self-pay

## 2019-09-09 ENCOUNTER — Ambulatory Visit (HOSPITAL_COMMUNITY): Payer: Medicare Other | Attending: Cardiology

## 2019-09-09 ENCOUNTER — Other Ambulatory Visit: Payer: Self-pay

## 2019-09-09 DIAGNOSIS — Z87898 Personal history of other specified conditions: Secondary | ICD-10-CM | POA: Diagnosis not present

## 2019-09-09 DIAGNOSIS — I10 Essential (primary) hypertension: Secondary | ICD-10-CM | POA: Diagnosis not present

## 2019-09-09 DIAGNOSIS — I351 Nonrheumatic aortic (valve) insufficiency: Secondary | ICD-10-CM | POA: Diagnosis not present

## 2019-09-09 DIAGNOSIS — R0602 Shortness of breath: Secondary | ICD-10-CM | POA: Insufficient documentation

## 2019-09-09 DIAGNOSIS — E7800 Pure hypercholesterolemia, unspecified: Secondary | ICD-10-CM | POA: Diagnosis not present

## 2019-09-12 ENCOUNTER — Encounter: Payer: Self-pay | Admitting: Cardiology

## 2019-09-16 ENCOUNTER — Telehealth: Payer: Self-pay

## 2019-09-16 DIAGNOSIS — I351 Nonrheumatic aortic (valve) insufficiency: Secondary | ICD-10-CM

## 2019-09-16 NOTE — Telephone Encounter (Signed)
-----   Message from Sueanne Margarita, MD sent at 09/12/2019  6:55 PM EST ----- Echo showed mildly reduced heart function with stiffness of heart muscle, mildly enlarged left upper chamber of heart, mildly leaky MV and tricuspid valve, moderate leakiness of aortic valve and no significant change from October.  Please repeat echo in 1 year for leaky aortic valve

## 2019-09-23 ENCOUNTER — Other Ambulatory Visit: Payer: Self-pay

## 2019-09-23 ENCOUNTER — Encounter: Payer: Self-pay | Admitting: Neurology

## 2019-09-23 ENCOUNTER — Ambulatory Visit: Payer: Medicare Other | Admitting: Neurology

## 2019-09-23 VITALS — BP 143/64 | HR 75 | Temp 98.3°F | Ht 63.0 in | Wt 192.3 lb

## 2019-09-23 DIAGNOSIS — I420 Dilated cardiomyopathy: Secondary | ICD-10-CM | POA: Diagnosis not present

## 2019-09-23 DIAGNOSIS — G459 Transient cerebral ischemic attack, unspecified: Secondary | ICD-10-CM | POA: Diagnosis not present

## 2019-09-23 DIAGNOSIS — E669 Obesity, unspecified: Secondary | ICD-10-CM | POA: Diagnosis not present

## 2019-09-23 DIAGNOSIS — I519 Heart disease, unspecified: Secondary | ICD-10-CM | POA: Diagnosis not present

## 2019-09-23 NOTE — Progress Notes (Signed)
Subjective:    Patient ID: Kerry [name] is a 78 y.o. female.  HPI     Kerry Age, MD, PhD [facility] [address], [address] [place], [place] [address]  Dear Kerry [name], I saw your patient, , upon your kind request in my sleep clinic today for initial consultation of her sleep disorder, in particular, concern for underlying obstructive sleep apnea.  The patient is unaccompanied today.  As you know, Kerry [name] is a 79 year old right-handed woman with an underlying medical history of migraine headaches, hypertension, hyperlipidemia, endometrial cancer, TIA, chronic asthma, allergies, LV dysfunction and DCH, and obesity, who reports mild shortness of breath and some sleep disruption.  She sleeps in a recliner and has done so for the past year, reports that her bed is typically taken over by her cats.  She now has 5 cats at the house.  She has a TV on at bedtime but it turns off on a timer.  She also likes to read while in the recliner.  She has a bedtime between 11 and 11:30 PM, rise time around 6.  She does not have to get up in the middle of the night to go to the bathroom.  She denies morning headaches or restless leg symptoms.  She does have a history of asthma and allergies, had a tonsillectomy at [name] 43.  She also had dentures made at [name] 76 for the top and then later for the bottom.  She has some mucus in the mornings.  She is not sure if she snores.  She has seen cardiology last month and was diagnosed with LV dysfunction, dilated cardiomyopathy and has another echocardiogram pending.  I reviewed your office note from 07/30/2019. Her Epworth sleepiness score is 3 out of 24, fatigue severity score is 14 out of 63.  She lives alone, she works at [employer], no PT, she is widowed x 20 years, she has 2 grown daughters, one locally and one in [place].  She is a non-smoker and does not utilize alcohol and drinks caffeine in the form of coffee, 1 cup/day on  average and 2 cups of tea on average.   Her Past Medical History Is Significant For: Past Medical History:  Diagnosis Date  . Allergy   . Anemia    in her 20's  . Aortic insufficiency    moderate by echo 08/2019  . Arthritis    not dx'd- just per pt   . Asthma    AS A CHILD  . Cancer [facility])    endometrial cancer with hysterectomy in 1987  . Cancer ([facility])    melanoma on face   . Cataract    removed both eyes   . GERD (gastroesophageal reflux disease)    occ has with diet   . History of syncope   . HTN (hypertension)   . Hyperlipidemia     Her Past Surgical History Is Significant For: Past Surgical History:  Procedure Laterality Date  . ABDOMINAL HYSTERECTOMY    . ANKLE FRACTURE SURGERY    . APPENDECTOMY    . CATARACT EXTRACTION, BILATERAL    . CHOLECYSTECTOMY    . COLONOSCOPY  08/01/2007   Dr Henrene Pastor   . DENTAL SURGERY    . ovary removal    . right leg  fracture surgery       Her Family History Is Significant For: Family History  Problem Relation [name] of Onset  . Mitral valve prolapse Daughter   .  Heart disease Mother   . Kidney failure Mother   . Diabetes Mother   . Cancer Mother        uterine  . Hyperlipidemia Mother   . Hypertension Mother   . Uterine cancer Mother   . Dementia Father   . Diabetes Sister   . Diabetes Paternal Grandmother   . Throat cancer Brother   . Colon cancer Neg Hx   . Colon polyps Neg Hx   . Esophageal cancer Neg Hx   . Rectal cancer Neg Hx   . Stomach cancer Neg Hx     Her Social History Is Significant For: Social History   Socioeconomic History  . Marital status: Widowed    Spouse name: Not on file  . Number of children: 2  . Years of education: Not on file  . Highest education level: Not on file  Occupational History  . Not on file  Tobacco Use  . Smoking status: Never Smoker  . Smokeless tobacco: Never Used  Substance and Sexual Activity  . Alcohol use: No  . Drug use: No  . Sexual activity: Not Currently   Other Topics Concern  . Not on file  Social History Narrative   Lives at home alone   1 cup of coffee per day    Works at [employer] Strain:   . Difficulty of Paying Living Expenses: Not on file  Food Insecurity:   . Worried About Charity fundraiser in the Last Year: Not on file  . Ran Out of Food in the Last Year: Not on file  Transportation Needs:   . Lack of Transportation (Medical): Not on file  . Lack of Transportation (Non-Medical): Not on file  Physical Activity:   . Days of Exercise per Week: Not on file  . Minutes of Exercise per Session: Not on file  Stress:   . Feeling of Stress : Not on file  Social Connections:   . Frequency of Communication with Friends and Family: Not on file  . Frequency of Social Gatherings with Friends and Family: Not on file  . Attends Religious Services: Not on file  . Active Member of Clubs or Organizations: Not on file  . Attends Archivist Meetings: Not on file  . Marital Status: Not on file    Her Allergies Are:  Allergies  Allergen Reactions  . Lovastatin Other (See Comments)    myalgias  . Sudafed [Pseudoephedrine Hcl] Other (See Comments)    Hallucinations   :   Her Current Medications Are:  Outpatient Encounter Medications as of 09/23/2019  Medication Sig  . acetaminophen (TYLENOL) 500 MG tablet Take 500 mg by mouth every 6 (six) hours as needed for moderate pain or headache.   . albuterol (VENTOLIN HFA) 108 (90 Base) MCG/ACT inhaler Inhale 2 puffs into the lungs every 6 (six) hours as needed for wheezing or shortness of breath. (Patient taking differently: Inhale 2 puffs into the lungs as needed for wheezing or shortness of breath. )  . Ascorbic Acid (VITAMIN C PO) Take 1 tablet by mouth daily.  Marland Kitchen aspirin 81 MG chewable tablet Chew 81 mg by mouth daily. Every other day per pt  . carvedilol (COREG) 12.5 MG tablet Take 1 tablet (12.5 mg total) by mouth 2  (two) times daily.  . Cholecalciferol (VITAMIN D3) 2000 units capsule Take 2,000 Units by mouth daily.  . Coenzyme Q10 (CO Q  10 PO) Take 1 capsule by mouth daily.   . fexofenadine (ALLEGRA) 180 MG tablet Take 180 mg by mouth daily.  . hydrochlorothiazide (HYDRODIURIL) 25 MG tablet Take 1 tablet (25 mg total) by mouth daily.  Marland Kitchen lisinopril (ZESTRIL) 20 MG tablet Take 1 tablet (20 mg total) by mouth daily.  . Multiple Vitamin (MULTIVITAMIN) tablet Take 1 tablet by mouth daily.    . metoprolol tartrate (LOPRESSOR) 100 MG tablet Take 1 tablet (100 mg total) by mouth once for 1 dose. Take one tablet 2 hours prior to CT.  . [DISCONTINUED] ezetimibe (ZETIA) 10 MG tablet Take 1 tablet (10 mg total) by mouth daily.   No facility-administered encounter medications on file as of 09/23/2019.  :  Review of Systems:  Out of a complete 14 point review of systems, all are reviewed and negative with the exception of these symptoms as listed below: Review of Systems  Neurological:       Pt reports she was referred for sleep consult. Reports 6-7 hours of sleep per night. Reports she sleeps upright in her recliner most nights. No prior sleep study.  Epworth Sleepiness Scale 0= would never doze 1= slight chance of dozing 2= moderate chance of dozing 3= high chance of dozing  Sitting and reading: 1 Watching TV:1 Sitting inactive in a public place (ex. Theater or meeting):0 As a passenger in a car for an hour without a break:0 Lying down to rest in the afternoon:1 Sitting and talking to someone:0 Sitting quietly after lunch (no alcohol):0 In a car, while stopped in traffic:0 Total:3    Objective:  Neurological Exam  Physical Exam Physical Examination:   Vitals:   09/23/19 0903  BP: (!) 143/64  Pulse: 75  Temp: 98.3 F (36.8 C)    General Examination: The patient is a very pleasant 79 y.o. female in no acute distress. She appears well-developed and well-nourished and well groomed.   HEENT:  Normocephalic, atraumatic, pupils are equal, round and reactive to light, extraocular tracking is good without limitation to gaze excursion or nystagmus noted. Corrective eyeglasses in place. Hearing is grossly intact. Face is symmetric with normal facial animation. Speech is clear with no dysarthria noted. There is no hypophonia. There is no lip, neck/head, jaw or voice tremor. Neck is supple with full range of passive and active motion. There are no carotid bruits on auscultation. Oropharynx exam reveals: mild mouth dryness, adequate dental hygiene with full dentures in place, moderate airway crowding, due to Small airway entry and redundant soft palate, Mallampati is class III, tonsils absent, uvula not fully visualized.  Tongue protrudes centrally in palate elevates symmetrically, neck circumference is 15-1/8 inches.   Chest: Clear to auscultation without wheezing, rhonchi or crackles noted.  Heart: S1+S2+0, regular and normal without murmurs, rubs or gallops noted.   Abdomen: Soft, non-tender and non-distended with normal bowel sounds appreciated on auscultation.  Extremities: There is 1+ pitting edema in the distal lower extremities bilaterally, L more than R.    Skin: Warm and dry without trophic changes noted.   Musculoskeletal: exam reveals Deformity of left ankle with status post fracture in the past, she also had a leg fracture on the right, she has some discomfort in the left leg.   Neurologically:  Mental status: The patient is awake, alert and oriented in all 4 spheres. Her immediate and remote memory, attention, language skills and fund of knowledge are appropriate. There is no evidence of aphasia, agnosia, apraxia or anomia. Speech is clear  with normal prosody and enunciation. Thought process is linear. Mood is normal and affect is normal.  Cranial nerves II - XII are as described above under HEENT exam.  Motor exam: Normal bulk, strength and tone is noted. There is no tremor,  Romberg is not tested d/t safety concerns. Fine motor skills and coordination: grossly intact.  Cerebellar testing: No dysmetria or intention tremor. There is no truncal or gait ataxia.  Sensory exam: intact to light touch in the upper and lower extremities.  Gait, station and balance: She stands with mild difficulty and has initial stiffness in the left leg and a slight limp in the beginning on the left, otherwise no abnormalities, no walking aid.  Assessment and Plan:  In summary, [name] is a very pleasant 79 y.o.-year old female  with an underlying medical history of migraine headaches, hypertension, hyperlipidemia, endometrial cancer, TIA, chronic asthma, allergies, LV dysfunction and DCH, and obesity, whose history and physical exam are concerning for obstructive sleep apnea (OSA). I had a long chat with the patient about my findings and the diagnosis of OSA, its prognosis and treatment options. We talked about medical treatments, surgical interventions and non-pharmacological approaches. I explained in particular the risks and ramifications of untreated moderate to severe OSA, especially with respect to developing cardiovascular disease down the Road, including congestive heart failure, difficult to treat hypertension, cardiac arrhythmias, or stroke. Even type 2 diabetes has, in part, been linked to untreated OSA. Symptoms of untreated OSA include daytime sleepiness, memory problems, mood irritability and mood disorder such as depression and anxiety, lack of energy, as well as recurrent headaches, especially morning headaches. We talked about trying to maintain a healthy lifestyle in general, as well as the importance of weight control. We also talked about the importance of good sleep hygiene. I recommended the following at this time: sleep study.  I explained the sleep test procedure to the patient and also outlined possible surgical and non-surgical treatment options of OSA, including the  use of a custom-made dental device (which would require a referral to a specialist dentist or oral surgeon), upper airway surgical options. dHowever, realistically, positive airway pressure is going to be the most likely to be successful in her case. I explained the CPAP treatment option to the patient, who indicated that she would be willing to try CPAP if the need arises. I explained the importance of being compliant with PAP treatment, not only for insurance purposes but primarily to improve Her symptoms, and for the patient's long term health benefit, including to reduce Her cardiovascular risks. I answered all her questions today and the patient was in agreement. I plan to see her back after the sleep study is completed and encouraged her to call with any interim questions, concerns, problems or updates.   Thank you very much for allowing me to participate in the care of this nice patient. If I can be of any further assistance to you please do not hesitate to talk to me.  Sincerely,   Kerry Age, MD, PhD

## 2019-09-23 NOTE — Patient Instructions (Signed)

## 2019-10-10 ENCOUNTER — Other Ambulatory Visit: Payer: Self-pay

## 2019-10-10 MED ORDER — HYDROCHLOROTHIAZIDE 25 MG PO TABS: 25.0000 mg | ORAL_TABLET | Freq: Every day | ORAL | 3 refills | Status: DC

## 2019-10-13 ENCOUNTER — Ambulatory Visit (INDEPENDENT_AMBULATORY_CARE_PROVIDER_SITE_OTHER): Payer: Medicare Other | Admitting: Neurology

## 2019-10-13 DIAGNOSIS — G459 Transient cerebral ischemic attack, unspecified: Secondary | ICD-10-CM

## 2019-10-13 DIAGNOSIS — G4733 Obstructive sleep apnea (adult) (pediatric): Secondary | ICD-10-CM | POA: Diagnosis not present

## 2019-10-13 DIAGNOSIS — G4720 Circadian rhythm sleep disorder, unspecified type: Secondary | ICD-10-CM

## 2019-10-13 DIAGNOSIS — I519 Heart disease, unspecified: Secondary | ICD-10-CM

## 2019-10-13 DIAGNOSIS — E669 Obesity, unspecified: Secondary | ICD-10-CM

## 2019-10-13 DIAGNOSIS — I420 Dilated cardiomyopathy: Secondary | ICD-10-CM

## 2019-10-14 ENCOUNTER — Other Ambulatory Visit: Payer: Self-pay

## 2019-10-14 ENCOUNTER — Other Ambulatory Visit: Payer: Medicare Other | Admitting: *Deleted

## 2019-10-14 DIAGNOSIS — I10 Essential (primary) hypertension: Secondary | ICD-10-CM | POA: Diagnosis not present

## 2019-10-14 DIAGNOSIS — I351 Nonrheumatic aortic (valve) insufficiency: Secondary | ICD-10-CM | POA: Diagnosis not present

## 2019-10-14 DIAGNOSIS — Z87898 Personal history of other specified conditions: Secondary | ICD-10-CM | POA: Diagnosis not present

## 2019-10-14 DIAGNOSIS — R0602 Shortness of breath: Secondary | ICD-10-CM

## 2019-10-14 DIAGNOSIS — E7800 Pure hypercholesterolemia, unspecified: Secondary | ICD-10-CM | POA: Diagnosis not present

## 2019-10-14 LAB — BASIC METABOLIC PANEL
BUN/Creatinine Ratio: 23 (ref 12–28)
BUN: 18 mg/dL (ref 8–27)
CO2: 24 mmol/L (ref 20–29)
Calcium: 9.4 mg/dL (ref 8.7–10.3)
Chloride: 105 mmol/L (ref 96–106)
Creatinine, Ser: 0.77 mg/dL (ref 0.57–1.00)
GFR calc Af Amer: 86 mL/min/{1.73_m2} (ref >59)
GFR calc non Af Amer: 75 mL/min/{1.73_m2} (ref >59)
Glucose: 99 mg/dL (ref 65–99)
Potassium: 4.2 mmol/L (ref 3.5–5.2)
Sodium: 142 mmol/L (ref 134–144)

## 2019-10-18 ENCOUNTER — Telehealth (HOSPITAL_COMMUNITY): Payer: Self-pay | Admitting: Emergency Medicine

## 2019-10-18 NOTE — Telephone Encounter (Signed)
Left message on voicemail with name and callback number Kentavious Michele RN Navigator Cardiac Imaging [facility] [phone] Office [phone] Cell  

## 2019-10-21 ENCOUNTER — Ambulatory Visit (HOSPITAL_COMMUNITY)
Admission: RE | Admit: 2019-10-21 | Discharge: 2019-10-21 | Disposition: A | Discharge status: Home / Self Care | Payer: Medicare Other | Source: Ambulatory Visit | Attending: Cardiology | Admitting: Cardiology

## 2019-10-21 ENCOUNTER — Other Ambulatory Visit: Payer: Self-pay

## 2019-10-21 DIAGNOSIS — I2510 Atherosclerotic heart disease of native coronary artery without angina pectoris: Secondary | ICD-10-CM | POA: Insufficient documentation

## 2019-10-21 DIAGNOSIS — R0602 Shortness of breath: Secondary | ICD-10-CM | POA: Insufficient documentation

## 2019-10-21 MED ORDER — METOPROLOL TARTRATE 5 MG/5ML IV SOLN
5.0000 mg | INTRAVENOUS | Status: DC | PRN
  Administered 2019-10-21 (×2): 5 mg via INTRAVENOUS

## 2019-10-21 MED ORDER — METOPROLOL TARTRATE 5 MG/5ML IV SOLN
INTRAVENOUS | Status: AC
  Filled 2019-10-21: qty 5

## 2019-10-21 MED ORDER — IOHEXOL 350 MG/ML SOLN
80.0000 mL | Freq: Once | INTRAVENOUS | Status: AC | PRN
  Administered 2019-10-21: 80 mL via INTRAVENOUS

## 2019-10-21 MED ORDER — NITROGLYCERIN 0.4 MG SL SUBL
0.8000 mg | SUBLINGUAL_TABLET | Freq: Once | SUBLINGUAL | Status: AC
  Administered 2019-10-21: 0.8 mg via SUBLINGUAL

## 2019-10-21 MED ORDER — NITROGLYCERIN 0.4 MG SL SUBL
SUBLINGUAL_TABLET | SUBLINGUAL | Status: AC
  Filled 2019-10-21: qty 2

## 2019-10-21 NOTE — Progress Notes (Signed)
CT scan completed. Tolerated well. D/C home in wheelchair, awake and alert. In no distress 

## 2019-10-22 ENCOUNTER — Telehealth: Payer: Self-pay | Admitting: *Deleted

## 2019-10-22 DIAGNOSIS — R0602 Shortness of breath: Secondary | ICD-10-CM

## 2019-10-22 DIAGNOSIS — I420 Dilated cardiomyopathy: Secondary | ICD-10-CM

## 2019-10-22 DIAGNOSIS — E7800 Pure hypercholesterolemia, unspecified: Secondary | ICD-10-CM

## 2019-10-22 MED ORDER — ATORVASTATIN CALCIUM 20 MG PO TABS: 20.0000 mg | ORAL_TABLET | Freq: Every day | ORAL | 6 refills | Status: DC

## 2019-10-22 NOTE — Telephone Encounter (Signed)
Please have her come infor BNP

## 2019-10-22 NOTE — Telephone Encounter (Signed)
I spoke with patient and reviewed cardiac and non cardiac portions of CT with her. She had leg pain when taking lovastatin in the past. Also was on Zetia but this was only ordered for a month. She does not know why it was stopped. She is willing to try Atorvastatin. Will send prescription to [facility] on [address]. She will come in for fasting lab work on March 23,2020. Patient will let us know if she develops leg or muscle pain. She is having shortness of breath when doing activities such as walking up a hill or raking leaves. No shortness of breath at rest or walking on level ground. Does have asthma and allergies. Taking medication for this.

## 2019-10-22 NOTE — Progress Notes (Signed)
Patient referred by Janett Billow, seen by me on 09/23/19, PSG on 10/13/19.    Please call and notify the patient that the recent home sleep test showed obstructive sleep apnea. OSA is overall mild, but would be worth treating to see if she feels better after treatment. Also, in light of her medical hx , it may help to treat her mild OSA.  To that end I recommend treatment for this in the form of autoPAP, which means, that we don't have to bring her in for a sleep study with CPAP, but will let her try an autoPAP machine at home, through a DME company (of her choice, or as per insurance requirement). The DME representative will educate her on how to use the machine, how to put the mask on, etc. I have not put an order in yet, please let me know, how she would like to proceed. Alternative treatment could be weight loss and avoiding the supine sleep position or a dental device. If she would like to pursue dental treatment, I can place a referral to dentistry, a dentist of her choosing, or we can refer to one of the practices we know participate in OSA treatment. Again, let me know.   Star Age, MD, PhD [facility])

## 2019-10-22 NOTE — Telephone Encounter (Signed)
-----   Message from Sueanne Margarita, MD sent at 10/21/2019  6:16 PM EST ----- Coronary CTA showed mild atherosclerosis of the LAD and RCA.  LDL goal is < 70 and last LDL was 91.  Add Lipitor 20mg  daily and repeat FLP and ALT in 6 weeks.  Continue ASA.  Please find out if she is still having SOB. IF no SOB then followup with me in 1 year

## 2019-10-22 NOTE — Telephone Encounter (Signed)
Left message to call office

## 2019-10-22 NOTE — Procedures (Signed)
PATIENT'S NAME:  Kerry [name], Kerry [name] DOB:      [birth year]      MR#:    [id number]     DATE OF RECORDING: 10/13/2019 REFERRING M.D.:  Delia Chimes, MD Study Performed:   Baseline Polysomnogram HISTORY: 79 year old woman with a history of migraine headaches, hypertension, hyperlipidemia, endometrial cancer, TIA, chronic asthma, allergies, LV dysfunction and DCH, and obesity, who reports mild shortness of breath and some sleep disruption. The patient endorsed the Epworth Sleepiness Scale at 3 points. The patient's weight 192 pounds with a height of 63 (inches), resulting in a BMI of 34. kg/m2. The patient's neck circumference measured 15.25 inches.  CURRENT MEDICATIONS: Tylenol, Ventolin, Coreg, Allegra, Hydrodiuril, Zestril, Lopressor   PROCEDURE:  This is a multichannel digital polysomnogram utilizing the Somnostar 11.2 system.  Electrodes and sensors were applied and monitored per AASM Specifications.   EEG, EOG, Chin and Limb EMG, were sampled at 200 Hz.  ECG, Snore and Nasal Pressure, Thermal Airflow, Respiratory Effort, CPAP Flow and Pressure, Oximetry was sampled at 50 Hz. Digital video and audio were recorded.      BASELINE STUDY  Patient was supine, but with HOB raised and with a neck pillow in place. Lights Out was at 22:52 and Lights On at 05:04.  Total recording time (TRT) was 372.5 minutes, with a total sleep time (TST) of 211.5 minutes.   The patient's sleep latency was 47.5 minutes, which is delayed. REM latency was 50.5 minutes, which is reduced. The sleep efficiency was 56.8%, which is reduced.     SLEEP ARCHITECTURE: WASO (Wake after sleep onset) was 120.5 minutes with one longer period of wakefulness.  There were 20.5 minutes in Stage N1, 98.5 minutes Stage N2, 76.5 minutes Stage N3 and 16 minutes in Stage REM.  The percentage of Stage N1 was 9.7%, which is increased, Stage N2 was 46.6%, Stage N3 was 36.2%, which is increased, and Stage R (REM sleep) was 7.6%, which is reduced. The  arousals were noted as: 28 were spontaneous, 2 were associated with PLMs, 6 were associated with respiratory events.  RESPIRATORY ANALYSIS: There were a total of 42 respiratory events: 17 obstructive apneas, 0 central apneas and 2 mixed apneas with a total of 19 apneas and an apnea index (AI) of 5.4 /hour. There were 23 hypopneas with a hypopnea index of 6.5/hour. The patient also had 0 respiratory event related arousals (RERAs).      The total APNEA/HYPOPNEA INDEX (AHI) was 11.9/hour and the total RESPIRATORY DISTURBANCE INDEX was  11.9 /hour.  0 events occurred in REM sleep and 48 events in NREM. The REM AHI was 0/hour, versus a non-REM AHI of 12.9. The patient spent 211.5 minutes of total sleep time in the supine position and 0 minutes in non-supine. The supine AHI was 11.9/h versus a non-supine AHI of 0.0.  OXYGEN SATURATION & C02:  The Wake baseline 02 saturation was 95%, with the lowest being 86%. Time spent below 89% saturation equaled 3 minutes.  PERIODIC LIMB MOVEMENTS: The patient had a total of 5 Periodic Limb Movements.  The Periodic Limb Movement (PLM) index was 1.4 and the PLM Arousal index was .6/hour.  Audio and video analysis did not show any abnormal or unusual movements, behaviors, phonations or vocalizations. The patient took 1 bathroom break. Mild snoring was noted. The EKG was in keeping with normal sinus rhythm (NSR).  Post-study, the patient indicated that sleep was the same as usual.   IMPRESSION:  1. Mild obstructive Sleep Apnea (OSA)  2. Dysfunctions associated with sleep stages or arousal from sleep  RECOMMENDATIONS:  1. This study demonstrates overall mild obstructive sleep apnea, with a total AHI of 11.9/hour, and O2 nadir of 86%. Given the patient's medical history and sleep related complaints, treatment with positive airway pressure is recommended; this can be achieved in the form of autoPAP. Alternatively, a full-night CPAP titration study would allow optimization  of therapy if needed. Other treatment options may include avoidance of supine sleep position along with weight loss, upper airway or jaw surgery in selected patients or the use of an oral appliance in certain patients. ENT evaluation and/or consultation with a maxillofacial surgeon or dentist may be feasible in some instances. The autoPAP option will be discussed with the patient. Please note that untreated obstructive sleep apnea may carry additional perioperative morbidity. Patients with significant obstructive sleep apnea should receive perioperative PAP therapy and the surgeons and particularly the anesthesiologist should be informed of the diagnosis and the severity of the sleep disordered breathing. 2. This study shows sleep fragmentation and abnormal sleep stage percentages; these are nonspecific findings and per se do not signify an intrinsic sleep disorder or a cause for the patient's sleep-related symptoms. Causes include (but are not limited to) the first night effect of the sleep study, circadian rhythm disturbances, medication effect or an underlying mood disorder or medical problem.  3. The patient should be cautioned not to drive, work at heights, or operate dangerous or heavy equipment when tired or sleepy. Review and reiteration of good sleep hygiene measures should be pursued with any patient. 4. The patient and her referring provider will be notified of the test results.  I certify that I have reviewed the entire raw data recording prior to the issuance of this report in accordance with the Standards of Accreditation of the American Academy of Sleep Medicine (AASM)   Star Age, MD, PhD Diplomat, American Board of Neurology and Sleep Medicine (Neurology and Sleep Medicine)

## 2019-10-23 NOTE — Telephone Encounter (Signed)
Patient will come in tomorrow 02/11 for BNP

## 2019-10-23 NOTE — Telephone Encounter (Signed)
Patient is returning call.  °

## 2019-10-24 ENCOUNTER — Other Ambulatory Visit: Payer: Medicare Other | Admitting: *Deleted

## 2019-10-24 ENCOUNTER — Other Ambulatory Visit: Payer: Self-pay

## 2019-10-24 ENCOUNTER — Telehealth: Payer: Self-pay

## 2019-10-24 DIAGNOSIS — I420 Dilated cardiomyopathy: Secondary | ICD-10-CM

## 2019-10-24 DIAGNOSIS — G4733 Obstructive sleep apnea (adult) (pediatric): Secondary | ICD-10-CM

## 2019-10-24 DIAGNOSIS — R0602 Shortness of breath: Secondary | ICD-10-CM | POA: Diagnosis not present

## 2019-10-24 NOTE — Telephone Encounter (Signed)
-----   Message from Star Age, MD sent at 10/22/2019  8:11 AM EST ----- Patient referred by Janett Billow, seen by me on 09/23/19, PSG on 10/13/19.    Please call and notify the patient that the recent home sleep test showed obstructive sleep apnea. OSA is overall mild, but would be worth treating to see if she feels better after treatment. Also, in light of her medical hx , it may help to treat her mild OSA.  To that end I recommend treatment for this in the form of autoPAP, which means, that we don't have to bring her in for a sleep study with CPAP, but will let her try an autoPAP machine at home, through a DME company (of her choice, or as per insurance requirement). The DME representative will educate her on how to use the machine, how to put the mask on, etc. I have not put an order in yet, please let me know, how she would like to proceed. Alternative treatment could be weight loss and avoiding the supine sleep position or a dental device. If she would like to pursue dental treatment, I can place a referral to dentistry, a dentist of her choosing, or we can refer to one of the practices we know participate in OSA treatment. Again, let me know.   Star Age, MD, PhD [facility])

## 2019-10-24 NOTE — Telephone Encounter (Signed)
I reached out to the pt and advised of sleep study results. She verbalized understanding. She sts she is going to think about the auto pap over the weekend and then call me back on 2/15 or 10/29/2019 and let me know how she would like would like to proceed. Pt has [facility] # and I advised I would wait to hear from her.

## 2019-10-25 LAB — PRO B NATRIURETIC PEPTIDE: NT-Pro BNP: 356 pg/mL (ref 0–738)

## 2019-11-28 ENCOUNTER — Encounter: Payer: Self-pay | Admitting: Adult Health

## 2019-11-28 ENCOUNTER — Ambulatory Visit: Payer: Medicare Other | Admitting: Adult Health

## 2019-11-28 ENCOUNTER — Other Ambulatory Visit: Payer: Self-pay

## 2019-11-28 VITALS — BP 124/58 | HR 60 | Temp 97.1°F | Ht 63.0 in | Wt 196.0 lb

## 2019-11-28 DIAGNOSIS — G4733 Obstructive sleep apnea (adult) (pediatric): Secondary | ICD-10-CM | POA: Diagnosis not present

## 2019-11-28 DIAGNOSIS — E7800 Pure hypercholesterolemia, unspecified: Secondary | ICD-10-CM

## 2019-11-28 DIAGNOSIS — G459 Transient cerebral ischemic attack, unspecified: Secondary | ICD-10-CM

## 2019-11-28 DIAGNOSIS — I10 Essential (primary) hypertension: Secondary | ICD-10-CM

## 2019-11-28 NOTE — Progress Notes (Signed)
[facility] [address] [place]. [place] [address] [phone]       STROKE FOLLOW UP NOTE  [name] Date of Birth:  21-Feb-1941 Medical Record Number:  [id number]   Reason for Referral:  stroke follow up    CHIEF COMPLAINT:  Chief Complaint  Patient presents with  . Transient Ischemic Attack    rm 9, 4 month FU "doing well except for body/joint arthritis aches"    HPI:   [name] is a 79 year old female who is being seen today, 11/28/2019, for TIA follow-up.  She has been doing well from a stroke standpoint reoccurring stroke/TIA symptoms.  She was referred to [facility] for suspected sleep apnea evaluated by Dr. Rexene Alberts and underwent sleep study on 10/13/2019 which did show mild sleep apnea with AHI 11.9/h and recommended initiation of AutoPap.  Per review of phone conversation on 10/24/2019, patient wanted to further consider use of AutoPap and at this time, is interested in pursuing.  Continues on aspirin and recently initiated atorvastatin by cardiology for secondary stroke prevention without side effects.  Blood pressure today satisfactory at 124/58.  No further concerns at this time.     History copied for reference purposes only Stroke admission 06/20/2019: [name] is a 79 y.o. female with history of HTN, HLD, migraines, endometrial cancer s/p TAH 1987  presented on 06/20/2019 with transient slurred speech with word finding difficulty and right sided numbness.    Stroke work-up completed with symptoms likely related to left brain cortical TIA which appeared embolic in origin.  MRI no acute stroke with evidence of small vessel disease.  MRA negative for LVO with mild arthrosclerosis and bilateral maxillary sinus fluid levels.  Carotid Doppler bilateral ICA 1 to 39% stenosis in VAs antegrade.  2D echo EF of 45 to 50% with some diastolic function but no evidence of cardiac source of embolus..  Lower extremity venous Dopplers negative for  DVT.  Recommended loop recorder placement to assess for atrial fibrillation but patient declined and was agreeable to do outpatient cardiac monitoring and recommend follow-up with cardiology outpatient to initiate.  LDL 91.  A1c 5.6.  Recommended DAPT for 3 weeks and aspirin alone.  HTN stable.  Initiated Zetia 10 mg daily with history of statin intolerance.  Other stroke risk factors include new combined systolic/diastolic CHF, advanced age and migraines but no prior history of stroke.  Discharged home in stable condition without therapy needs.  Initial visit 07/30/2019 JM: [name] is a 79 year old female who is being seen today for hospital follow-up.  Has been stable since discharge without new or reoccurring stroke/TIA symptoms.  She has returned to work at [employer] without difficulty.  She does experience bilateral lower extremity pain and right shoulder pain which appears to be related to arthritis.  She does endorse daytime fatigue where she can easily lay down to take a nap or will fall asleep watching TV.  She does have to sit upright in a recliner while sleeping.  At times she will feel increased shortness of breath with exertion and is unsure if this is related to her asthma.  She does occasionally experience visual aura migraines 2-3 times per year with longstanding history of migraines.  She also experiences occasional double left frontal pain typically occurring with increased anxiety or stress.  Completed 3 weeks DAPT and continues on aspirin alone without bleeding or bruising.  Continues on Zetia without side effects.  Blood pressure today 145/69.  Follow-up with cardiology scheduled on 08/06/2019 to discuss 30-day cardiac event monitor.  Has appointment with a different cardiologist in December with patient reporting to follow-up regarding CHF.  No further concerns at this time.     ROS:   14 system review of systems performed and negative with exception of joint pain, fatigue  PMH:    Past Medical History:  Diagnosis Date  . Allergy   . Anemia    in her 20's  . Aortic insufficiency    moderate by echo 08/2019  . Arthritis    not dx'd- just per pt   . Asthma    AS A CHILD  . Cancer [facility])    endometrial cancer with hysterectomy in 1987  . Cancer ([place])    melanoma on face   . Cataract    removed both eyes   . GERD (gastroesophageal reflux disease)    occ has with diet   . History of syncope   . HTN (hypertension)   . Hyperlipidemia   . TIA (transient ischemic attack)     PSH:  Past Surgical History:  Procedure Laterality Date  . ABDOMINAL HYSTERECTOMY    . ANKLE FRACTURE SURGERY    . APPENDECTOMY    . CATARACT EXTRACTION, BILATERAL    . CHOLECYSTECTOMY    . COLONOSCOPY  08/01/2007   Dr Henrene Pastor   . DENTAL SURGERY    . ovary removal    . right leg  fracture surgery       Social History:  Social History   Socioeconomic History  . Marital status: Widowed    Spouse name: Not on file  . Number of children: 2  . Years of education: Not on file  . Highest education level: Not on file  Occupational History  . Not on file  Tobacco Use  . Smoking status: Never Smoker  . Smokeless tobacco: Never Used  Substance and Sexual Activity  . Alcohol use: No  . Drug use: No  . Sexual activity: Not Currently  Other Topics Concern  . Not on file  Social History Narrative   Lives at home alone   1 cup of coffee per day    Works at [employer] Strain:   . Difficulty of Paying Living Expenses:   Food Insecurity:   . Worried About Charity fundraiser in the Last Year:   . Arboriculturist in the Last Year:   Transportation Needs:   . Film/video editor (Medical):   Marland Kitchen Lack of Transportation (Non-Medical):   Physical Activity:   . Days of Exercise per Week:   . Minutes of Exercise per Session:   Stress:   . Feeling of Stress :   Social Connections:   . Frequency of Communication with  Friends and Family:   . Frequency of Social Gatherings with Friends and Family:   . Attends Religious Services:   . Active Member of Clubs or Organizations:   . Attends Archivist Meetings:   Marland Kitchen Marital Status:   Intimate Partner Violence:   . Fear of Current or Ex-Partner:   . Emotionally Abused:   Marland Kitchen Physically Abused:   . Sexually Abused:     Family History:  Family History  Problem Relation Age of Onset  . Mitral valve prolapse Daughter   . Heart disease Mother   . Kidney failure Mother   . Diabetes Mother   .  Cancer Mother        uterine  . Hyperlipidemia Mother   . Hypertension Mother   . Uterine cancer Mother   . Dementia Father   . Diabetes Sister   . Multiple sclerosis Sister   . Diabetes Paternal Grandmother   . Throat cancer Brother   . Colon cancer Neg Hx   . Colon polyps Neg Hx   . Esophageal cancer Neg Hx   . Rectal cancer Neg Hx   . Stomach cancer Neg Hx     Medications:   Current Outpatient Medications on File Prior to Visit  Medication Sig Dispense Refill  . acetaminophen (TYLENOL) 500 MG tablet Take 500 mg by mouth every 6 (six) hours as needed for moderate pain or headache.     . albuterol (VENTOLIN HFA) 108 (90 Base) MCG/ACT inhaler Inhale 2 puffs into the lungs every 6 (six) hours as needed for wheezing or shortness of breath. (Patient taking differently: Inhale 2 puffs into the lungs as needed for wheezing or shortness of breath. ) 1 Inhaler 0  . Ascorbic Acid (VITAMIN C PO) Take 1 tablet by mouth daily.    Marland Kitchen aspirin 81 MG chewable tablet Chew 81 mg by mouth daily. Every other day per pt    . atorvastatin (LIPITOR) 20 MG tablet Take 1 tablet (20 mg total) by mouth daily. 30 tablet 6  . Cholecalciferol (VITAMIN D3) 2000 units capsule Take 2,000 Units by mouth daily.    . Coenzyme Q10 (CO Q 10 PO) Take 1 capsule by mouth daily.     . fexofenadine (ALLEGRA) 180 MG tablet Take 180 mg by mouth daily.    . hydrochlorothiazide (HYDRODIURIL) 25 MG  tablet Take 1 tablet (25 mg total) by mouth daily. 90 tablet 3  . lisinopril (ZESTRIL) 20 MG tablet Take 1 tablet (20 mg total) by mouth daily. 90 tablet 3  . Multiple Vitamin (MULTIVITAMIN) tablet Take 1 tablet by mouth daily.      . carvedilol (COREG) 12.5 MG tablet Take 1 tablet (12.5 mg total) by mouth 2 (two) times daily. 180 tablet 3   No current facility-administered medications on file prior to visit.    Allergies:   Allergies  Allergen Reactions  . Lovastatin Other (See Comments)    myalgias  . Sudafed [Pseudoephedrine Hcl] Other (See Comments)    Hallucinations      Physical Exam  Vitals:   11/28/19 0842  BP: (!) 124/58  Pulse: 60  Temp: (!) 97.1 F (36.2 C)  Weight: 196 lb (88.9 kg)  Height: 5\' 3"  (1.6 m)   Body mass index is 34.72 kg/m. No exam data present  General: well developed, well nourished, pleasant elderly Caucasian female, seated, in no evident distress Head: head normocephalic and atraumatic.   Neck: supple with no carotid or supraclavicular bruits Cardiovascular: regular rate and rhythm, no murmurs Musculoskeletal: Multiple bilateral hand arthritic nodules Skin:  no rash/petichiae Vascular:  Normal pulses all extremities   Neurologic Exam Mental Status: Awake and fully alert.   Normal speech and language.  Oriented to place and time. Recent and remote memory intact. Attention span, concentration and fund of knowledge appropriate. Mood and affect appropriate.  Cranial Nerves: Pupils equal, briskly reactive to light. Extraocular movements full without nystagmus. Visual fields full to confrontation. Hearing intact. Facial sensation intact. Face, tongue, palate moves normally and symmetrically.  Motor: Normal bulk and tone. Normal strength in all tested extremity muscles. Sensory.: intact to touch , pinprick , position  and vibratory sensation.  Coordination: Rapid alternating movements normal in all extremities. Finger-to-nose and heel-to-shin  performed accurately bilaterally. Gait and Station: Arises from chair without difficulty. Stance is normal. Gait demonstrates normal stride length and balance Reflexes: 1+ and symmetric. Toes downgoing.         Diagnostic Data (Labs, Imaging, Testing)  CT HEAD WO CONTRAST 06/20/2019 IMPRESSION: 1. No acute abnormality. 2. Minimal diffuse cerebral and cerebellar atrophy with minimal progression. 3. Acute bilateral maxillary sinusitis.  MR BRAIN WO CONTRAST 06/20/2019 IMPRESSION: 1. No acute intracranial abnormality. 2. Mild chronic small vessel disease.  MR MRA HEAD  06/21/2019 IMPRESSION: 1. No emergent finding. 2. Mild atheromatous changes. 3. Maxillary sinus fluid levels on both sides.  ECHOCARDIOGRAM 06/21/2019 IMPRESSIONS  1. Left ventricular ejection fraction, by visual estimation, is 45 to 50%. The left ventricle has mildly decreased function. Mildly increased left ventricular size. There is no left ventricular hypertrophy.  2. Left ventricular diastolic Doppler parameters are consistent with impaired relaxation pattern of LV diastolic filling.  3. Global right ventricle has normal systolic function.The right ventricular size is mildly enlarged. No increase in right ventricular wall thickness.  4. Left atrial size was mildly dilated.  5. Right atrial size was mildly dilated.  6. The pericardial effusion is anterior to the right ventricle.  7. Trivial pericardial effusion is present.  8. The mitral valve is normal in structure. No evidence of mitral valve regurgitation. No evidence of mitral stenosis.  9. The tricuspid valve is normal in structure. Tricuspid valve regurgitation is trivial. 10. The aortic valve is normal in structure. Aortic valve regurgitation is mild by color flow Doppler. Mild aortic valve stenosis. 11. The pulmonic valve was normal in structure. Pulmonic valve regurgitation is not visualized by color flow Doppler. 12. Normal pulmonary artery  systolic pressure. 13. The tricuspid regurgitant velocity is 2.52 m/s, and with an assumed right atrial pressure of 3 mmHg, the estimated right ventricular systolic pressure is normal at 28.4 mmHg. 14. The inferior vena cava is normal in size with greater than 50% respiratory variability, suggesting right atrial pressure of 3 mmHg. 15. No embolic source identified.  VAS [place] LOWER EXTREMITY VENOUS BILATERAL 06/21/2019 Summary: Right: There is no evidence of deep vein thrombosis in the lower extremity. No cystic structure found in the popliteal fossa. Left: There is no evidence of deep vein thrombosis in the lower extremity. A cystic structure is found in the popliteal fossa.  VAS US CAROTID DUPLEX BILATERAL 06/21/2019 Summary: Right Carotid: Velocities in the right ICA are consistent with a 1-39% stenosis. Left Carotid: Velocities in the left ICA are consistent with a 1-39% stenosis. Vertebrals: Bilateral vertebral arteries demonstrate antegrade flow.      ASSESSMENT: Kerry [name] is a 79 y.o. year old female presented with slurred speech with word finding difficulty and right-sided numbness on 06/20/2019 with stroke work-up completed and symptoms likely related to left brain cortical TIA possibly embolic in origin.  Recommended 30-day cardiac event monitor as patient declined loop recorder placement and will be following with cardiology next week.  Cardiac event monitor negative for atrial fibrillation.  Vascular risk factors include new diagnosis of CHF, HTN, HLD and migraines.  Overall stable from a neurological standpoint.  Recent sleep study did show evidence of mild obstructive sleep apnea and recommended initiating CPAP.    PLAN:  1. TIA: Continue aspirin 81 mg daily  and atorvastatin for secondary stroke prevention.   Maintain strict control of hypertension with blood pressure goal  below 130/90, diabetes with hemoglobin A1c goal below 6.5% and cholesterol with LDL cholesterol (bad  cholesterol) goal below 70 mg/dL.  I also advised the patient to eat a healthy diet with plenty of whole grains, cereals, fruits and vegetables, exercise regularly with at least 30 minutes of continuous activity daily and maintain ideal body weight. 2. HTN: Advised to continue current treatment regimen. Does not currently monitor at home and highly encouraged to do so with ongoing follow-up with PCP 3. HLD: Advised to continue current treatment regimen along with continued follow-up with PCP for future prescribing and monitoring of lipid panel 4. New dx OSA: She did agree to pursue apnea treatment with CPAP.  Will follow up with Dr. Guadelupe Sabin nurse in regards to further assisting with initiating CPAP.   No indication for further follow-up from a stroke standpoint as she has been stable and follows with cardiology and PCP regularly.  She will need 2 to 3 months follow-up for initial CPAP compliance visit   I spent 22 minutes of face-to-face and non-face-to-face time with patient.  This included previsit chart review, lab review, study review, order entry, electronic health record documentation, patient education    Frann Rider, [facility]  [facility] [address] [place], [place] [address]  Phone [phone] Fax [phone] Note: This document was prepared with digital dictation and possible smart phrase technology. Any transcriptional errors that result from this process are unintentional.

## 2019-11-28 NOTE — Telephone Encounter (Signed)
Pt told Janett Billow that she wishes to start auto pap. I called pt to discuss. No answer, left a message asking her to call me back.

## 2019-11-28 NOTE — Patient Instructions (Signed)
Continue aspirin 81 mg daily  and atorvastatin for secondary stroke prevention  Continue to follow up with PCP regarding cholesterol and blood pressure management   You will be called to set up CPAP machine   Continue to monitor blood pressure at home  Maintain strict control of hypertension with blood pressure goal below 130/90, diabetes with hemoglobin A1c goal below 6.5% and cholesterol with LDL cholesterol (bad cholesterol) goal below 70 mg/dL. I also advised the patient to eat a healthy diet with plenty of whole grains, cereals, fruits and vegetables, exercise regularly and maintain ideal body weight.        Thank you for coming to see [place] at [facility]. I hope we have been able to provide you high quality care today.  You may receive a patient satisfaction survey over the next few weeks. We would appreciate your feedback and comments so that we may continue to improve ourselves and the health of our patients.

## 2019-12-02 NOTE — Telephone Encounter (Signed)
I called pt. She wants to start auto pap. I discussed compliance expectations. Order will be sent to [facility]. A follow up appt was made for 02/26/20 at 9:45am with Janett Billow, NP. Pt verbalized understanding of recommendations and appt date and time.

## 2019-12-02 NOTE — Addendum Note (Signed)
Addended by: Star Age on: 12/02/2019 12:35 PM   Modules accepted: Orders

## 2019-12-02 NOTE — Telephone Encounter (Signed)
We will set patient up with autoPAP at home, as per pt preference.

## 2019-12-03 ENCOUNTER — Other Ambulatory Visit: Payer: Self-pay

## 2019-12-03 ENCOUNTER — Other Ambulatory Visit: Payer: Medicare Other

## 2019-12-03 DIAGNOSIS — E7800 Pure hypercholesterolemia, unspecified: Secondary | ICD-10-CM | POA: Diagnosis not present

## 2019-12-03 LAB — LIPID PANEL
Chol/HDL Ratio: 2.3 ratio (ref 0.0–4.4)
Cholesterol, Total: 133 mg/dL (ref 100–199)
HDL: 58 mg/dL (ref >39)
LDL Chol Calc (NIH): 59 mg/dL (ref 0–99)
Triglycerides: 85 mg/dL (ref 0–149)
VLDL Cholesterol Cal: 16 mg/dL (ref 5–40)

## 2019-12-03 LAB — ALT: ALT: 13 IU/L (ref 0–32)

## 2019-12-09 DIAGNOSIS — G4733 Obstructive sleep apnea (adult) (pediatric): Secondary | ICD-10-CM | POA: Diagnosis not present

## 2019-12-13 NOTE — Progress Notes (Signed)
I agree with the above plan 

## 2020-01-09 DIAGNOSIS — G4733 Obstructive sleep apnea (adult) (pediatric): Secondary | ICD-10-CM | POA: Diagnosis not present

## 2020-01-29 DIAGNOSIS — L119 Acantholytic disorder, unspecified: Secondary | ICD-10-CM | POA: Diagnosis not present

## 2020-01-29 DIAGNOSIS — L219 Seborrheic dermatitis, unspecified: Secondary | ICD-10-CM | POA: Diagnosis not present

## 2020-01-29 DIAGNOSIS — L821 Other seborrheic keratosis: Secondary | ICD-10-CM | POA: Diagnosis not present

## 2020-01-29 DIAGNOSIS — Z85828 Personal history of other malignant neoplasm of skin: Secondary | ICD-10-CM | POA: Diagnosis not present

## 2020-01-29 DIAGNOSIS — L570 Actinic keratosis: Secondary | ICD-10-CM | POA: Diagnosis not present

## 2020-01-29 DIAGNOSIS — Z86006 Personal history of melanoma in-situ: Secondary | ICD-10-CM | POA: Diagnosis not present

## 2020-02-03 DIAGNOSIS — H26493 Other secondary cataract, bilateral: Secondary | ICD-10-CM | POA: Diagnosis not present

## 2020-02-03 DIAGNOSIS — H43813 Vitreous degeneration, bilateral: Secondary | ICD-10-CM | POA: Diagnosis not present

## 2020-02-03 DIAGNOSIS — D3131 Benign neoplasm of right choroid: Secondary | ICD-10-CM | POA: Diagnosis not present

## 2020-02-03 DIAGNOSIS — D3132 Benign neoplasm of left choroid: Secondary | ICD-10-CM | POA: Diagnosis not present

## 2020-02-06 DIAGNOSIS — H26493 Other secondary cataract, bilateral: Secondary | ICD-10-CM | POA: Diagnosis not present

## 2020-02-08 DIAGNOSIS — G4733 Obstructive sleep apnea (adult) (pediatric): Secondary | ICD-10-CM | POA: Diagnosis not present

## 2020-02-18 DIAGNOSIS — H26492 Other secondary cataract, left eye: Secondary | ICD-10-CM | POA: Diagnosis not present

## 2020-02-26 ENCOUNTER — Ambulatory Visit: Payer: Medicare Other | Admitting: Adult Health

## 2020-02-26 ENCOUNTER — Encounter: Payer: Self-pay | Admitting: Adult Health

## 2020-02-26 VITALS — BP 133/71 | HR 63 | Ht 63.0 in | Wt 192.0 lb

## 2020-02-26 DIAGNOSIS — Z9989 Dependence on other enabling machines and devices: Secondary | ICD-10-CM | POA: Diagnosis not present

## 2020-02-26 DIAGNOSIS — G4733 Obstructive sleep apnea (adult) (pediatric): Secondary | ICD-10-CM | POA: Diagnosis not present

## 2020-02-26 DIAGNOSIS — G459 Transient cerebral ischemic attack, unspecified: Secondary | ICD-10-CM | POA: Diagnosis not present

## 2020-02-26 NOTE — Patient Instructions (Signed)
Your Plan:  Continue ongoing use of CPAP  Try increasing your humidity level on your machine as this can help with dry mouth. You can also try use of biotin prior to going to bed and after waking up.    Follow up in 6 months or call earlier if needed     Thank you for coming to see [place] at [facility]. I hope we have been able to provide you high quality care today.  You may receive a patient satisfaction survey over the next few weeks. We would appreciate your feedback and comments so that we may continue to improve ourselves and the health of our patients.

## 2020-02-26 NOTE — Progress Notes (Signed)
[facility] [address] [place]. [place] [address] [phone]       OFFICE FOLLOW UP NOTE  [name] Date of Birth:  October 15, 1940 Medical Record Number:  [id number]    [facility] stroke provider: Dr. Leonie Man [facility] sleep provider: Dr. Rexene Alberts   Reason for visit: Initial CPAP compliance visit    CHIEF COMPLAINT:  Chief Complaint  Patient presents with  . Sleep Apnea    Initial CPAP compliance visit  . Transient Ischemic Attack    hx of TIA - stable    HPI:   Today, 02/26/2020, Kerry [name] is being seen for initial CPAP compliance visit with prior history of TIA.  Compliance report reviewed from 01/26/2020 -02/24/2020 showing 28 out of 30 usage days with 27 days greater than 4 hours for 90% compliance with average usage 6 hours and 59 minutes.  Residual AHI 2.1 with min pressure 5 cm H2O and max pressure of 11 cm H2O with EPR level 1.  Pressure in the 95th percentile 10.7.  Leaks in the 95th percentile 45.5 L/min. She does report mild improvement of daytime fatigue where she continues to work without difficulty and typically only becomes tired in the evening.  ESS 6 (reading, watching TV and laying down in afternoon).  She does report waking up with dry mouth in the morning as well as difficulty with allergies and increased congestion.  Reports occasional leaks when laying on her side but will subside after mask adjustment. She continues to follow with [facility] for needed supplies and is currently up to date on supplies.   Stable from TIA standpoint without new or reoccurring stroke/TIA symptoms.  Remains on aspirin and atorvastatin for secondary stroke prevention.  Blood pressure today 133/71.  Continues to follow routinely with PCP/cardiology for HTN and HLD management.  No stroke/TIA related concerns at this time.    History provided for reference purposes only Update 11/28/2019 JM:: Kerry [name] is a 79 year old female who is being seen today, 11/28/2019, for TIA  follow-up.  She has been doing well from a stroke standpoint reoccurring stroke/TIA symptoms.  She was referred to [facility] for suspected sleep apnea evaluated by Dr. Rexene Alberts and underwent sleep study on 10/13/2019 which did show mild sleep apnea with AHI 11.9/h and recommended initiation of AutoPap.  Per review of phone conversation on 10/24/2019, patient wanted to further consider use of AutoPap and at this time, is interested in pursuing.  Continues on aspirin and recently initiated atorvastatin by cardiology for secondary stroke prevention without side effects.  Blood pressure today satisfactory at 124/58.  No further concerns at this time.  Initial visit 07/30/2019 JM: Kerry [name] is a 79 year old female who is being seen today for hospital follow-up.  Has been stable since discharge without new or reoccurring stroke/TIA symptoms.  She has returned to work at [employer] without difficulty.  She does experience bilateral lower extremity pain and right shoulder pain which appears to be related to arthritis.  She does endorse daytime fatigue where she can easily lay down to take a nap or will fall asleep watching TV.  She does have to sit upright in a recliner while sleeping.  At times she will feel increased shortness of breath with exertion and is unsure if this is related to her asthma.  She does occasionally experience visual aura migraines 2-3 times per year with longstanding history of migraines.  She also experiences occasional double left frontal pain typically occurring with increased anxiety or  stress.  Completed 3 weeks DAPT and continues on aspirin alone without bleeding or bruising.  Continues on Zetia without side effects.  Blood pressure today 145/69.  Follow-up with cardiology scheduled on 08/06/2019 to discuss 30-day cardiac event monitor.  Has appointment with a different cardiologist in December with patient reporting to follow-up regarding CHF.  No further concerns at this time.   Stroke  admission 06/20/2019: Kerry [name] is a 79 y.o. female with history of HTN, HLD, migraines, endometrial cancer s/p TAH 1987  presented on 06/20/2019 with transient slurred speech with word finding difficulty and right sided numbness.    Stroke work-up completed with symptoms likely related to left brain cortical TIA which appeared embolic in origin.  MRI no acute stroke with evidence of small vessel disease.  MRA negative for LVO with mild arthrosclerosis and bilateral maxillary sinus fluid levels.  Carotid Doppler bilateral ICA 1 to 39% stenosis in VAs antegrade.  2D echo EF of 45 to 50% with some diastolic function but no evidence of cardiac source of embolus..  Lower extremity venous Dopplers negative for DVT.  Recommended loop recorder placement to assess for atrial fibrillation but patient declined and was agreeable to do outpatient cardiac monitoring and recommend follow-up with cardiology outpatient to initiate.  LDL 91.  A1c 5.6.  Recommended DAPT for 3 weeks and aspirin alone.  HTN stable.  Initiated Zetia 10 mg daily with history of statin intolerance.  Other stroke risk factors include new combined systolic/diastolic CHF, advanced age and migraines but no prior history of stroke.  Discharged home in stable condition without therapy needs.    ROS:   14 system review of systems performed and negative with exception of fatigue  PMH:  Past Medical History:  Diagnosis Date  . Allergy   . Anemia    in her 20's  . Aortic insufficiency    moderate by echo 08/2019  . Arthritis    not dx'd- just per pt   . Asthma    AS A CHILD  . Cancer [facility])    endometrial cancer with hysterectomy in 1987  . Cancer ([facility])    melanoma on face   . Cataract    removed both eyes   . GERD (gastroesophageal reflux disease)    occ has with diet   . History of syncope   . HTN (hypertension)   . Hyperlipidemia   . TIA (transient ischemic attack)     PSH:  Past Surgical History:  Procedure Laterality  Date  . ABDOMINAL HYSTERECTOMY    . ANKLE FRACTURE SURGERY    . APPENDECTOMY    . CATARACT EXTRACTION, BILATERAL    . CHOLECYSTECTOMY    . COLONOSCOPY  08/01/2007   Dr Henrene Pastor   . DENTAL SURGERY    . ovary removal    . right leg  fracture surgery       Social History:  Social History   Socioeconomic History  . Marital status: Widowed    Spouse name: Not on file  . Number of children: 2  . Years of education: Not on file  . Highest education level: Not on file  Occupational History  . Not on file  Tobacco Use  . Smoking status: Never Smoker  . Smokeless tobacco: Never Used  Vaping Use  . Vaping Use: Never used  Substance and Sexual Activity  . Alcohol use: No  . Drug use: No  . Sexual activity: Not Currently  Other Topics Concern  .  Not on file  Social History Narrative   Lives at home alone   1 cup of coffee per day    Works at [employer] Strain:   . Difficulty of Paying Living Expenses:   Food Insecurity:   . Worried About Charity fundraiser in the Last Year:   . Arboriculturist in the Last Year:   Transportation Needs:   . Film/video editor (Medical):   Marland Kitchen Lack of Transportation (Non-Medical):   Physical Activity:   . Days of Exercise per Week:   . Minutes of Exercise per Session:   Stress:   . Feeling of Stress :   Social Connections:   . Frequency of Communication with Friends and Family:   . Frequency of Social Gatherings with Friends and Family:   . Attends Religious Services:   . Active Member of Clubs or Organizations:   . Attends Archivist Meetings:   Marland Kitchen Marital Status:   Intimate Partner Violence:   . Fear of Current or Ex-Partner:   . Emotionally Abused:   Marland Kitchen Physically Abused:   . Sexually Abused:     Family History:  Family History  Problem Relation Age of Onset  . Mitral valve prolapse Daughter   . Heart disease Mother   . Kidney failure Mother   . Diabetes  Mother   . Cancer Mother        uterine  . Hyperlipidemia Mother   . Hypertension Mother   . Uterine cancer Mother   . Dementia Father   . Diabetes Sister   . Multiple sclerosis Sister   . Diabetes Paternal Grandmother   . Throat cancer Brother   . Colon cancer Neg Hx   . Colon polyps Neg Hx   . Esophageal cancer Neg Hx   . Rectal cancer Neg Hx   . Stomach cancer Neg Hx     Medications:   Current Outpatient Medications on File Prior to Visit  Medication Sig Dispense Refill  . acetaminophen (TYLENOL) 500 MG tablet Take 500 mg by mouth every 6 (six) hours as needed for moderate pain or headache.     . albuterol (VENTOLIN HFA) 108 (90 Base) MCG/ACT inhaler Inhale 2 puffs into the lungs every 6 (six) hours as needed for wheezing or shortness of breath. (Patient taking differently: Inhale 2 puffs into the lungs as needed for wheezing or shortness of breath. ) 1 Inhaler 0  . Ascorbic Acid (VITAMIN C PO) Take 1 tablet by mouth daily.    Marland Kitchen aspirin 81 MG chewable tablet Chew 81 mg by mouth daily. Every other day per pt    . atorvastatin (LIPITOR) 20 MG tablet Take 1 tablet (20 mg total) by mouth daily. 30 tablet 6  . Cholecalciferol (VITAMIN D3) 2000 units capsule Take 2,000 Units by mouth daily.    . Coenzyme Q10 (CO Q 10 PO) Take 1 capsule by mouth daily.     . fexofenadine (ALLEGRA) 180 MG tablet Take 180 mg by mouth daily.    . hydrochlorothiazide (HYDRODIURIL) 25 MG tablet Take 1 tablet (25 mg total) by mouth daily. 90 tablet 3  . lisinopril (ZESTRIL) 20 MG tablet Take 1 tablet (20 mg total) by mouth daily. 90 tablet 3  . Multiple Vitamin (MULTIVITAMIN) tablet Take 1 tablet by mouth daily.      . carvedilol (COREG) 12.5 MG tablet Take 1 tablet (12.5 mg total) by  mouth 2 (two) times daily. 180 tablet 3   No current facility-administered medications on file prior to visit.    Allergies:   Allergies  Allergen Reactions  . Lovastatin Other (See Comments)    myalgias  . Sudafed  [Pseudoephedrine Hcl] Other (See Comments)    Hallucinations      Physical Exam  Vitals:   02/26/20 0945  BP: 133/71  Pulse: 63  Weight: 192 lb (87.1 kg)  Height: 5\' 3"  (1.6 m)   Body mass index is 34.01 kg/m. No exam data present  General: well developed, well nourished, pleasant elderly Caucasian female, seated, in no evident distress Head: head normocephalic and atraumatic.   Neck: supple with no carotid or supraclavicular bruits Cardiovascular: regular rate and rhythm, no murmurs Musculoskeletal: Multiple bilateral hand arthritic nodules Skin:  no rash/petichiae Vascular:  Normal pulses all extremities   Neurologic Exam Mental Status: Awake and fully alert.  Normal speech and language. Oriented to place and time. Recent and remote memory intact. Attention span, concentration and fund of knowledge appropriate. Mood and affect appropriate.  Cranial Nerves: Pupils equal, briskly reactive to light. Extraocular movements full without nystagmus. Visual fields full to confrontation. Hearing intact. Facial sensation intact. Face, tongue, palate moves normally and symmetrically.  Motor: Normal bulk and tone. Normal strength in all tested extremity muscles. Sensory.: intact to touch , pinprick , position and vibratory sensation.  Coordination: Rapid alternating movements normal in all extremities. Finger-to-nose and heel-to-shin performed accurately bilaterally. Gait and Station: Arises from chair without difficulty. Stance is normal. Gait demonstrates normal stride length and balance Reflexes: 1+ and symmetric. Toes downgoing.          ASSESSMENT/PLAN: Kerry [name] is a 79 y.o. year old female with underlying medical history of TIA 06/2019, CHF, HTN, HLD, migraines and new diagnosis of OSA with initiation of CPAP.  She is being seen today for initial CPAP compliance visit.     1. OSA on CPAP: -adequate compliance with CPAP with optimal residual AHI 2.1 -continue same  pressure settings and no indications for change -Recommend increasing humidity level to help with dry mouth complaints.  May also try use of Biotene or ice in monitor if needed -Continue to follow with DME company [facility] for any needed supplies or CPAP related concerns -Discussed importance of ongoing compliance with CPAP for sleep apnea management for decrease stroke and cardiovascular disease risk  2. Hx of TIA:  -Continue aspirin 81 mg daily  and atorvastatin for secondary stroke prevention.    -Continue to follow-up with PCP for HTN and HLD management -Maintain strict control of hypertension with blood pressure goal below 130/90, diabetes with hemoglobin A1c goal below 6.5% and cholesterol with LDL cholesterol (bad cholesterol) goal below 70 mg/dL.  I also advised the patient to eat a healthy diet with plenty of whole grains, cereals, fruits and vegetables, exercise regularly with at least 30 minutes of continuous activity daily and maintain ideal body weight.   Follow-up in 6 months or call earlier if needed   CC:  Forrest Moron, MD Dr. Leonie Man (stroke) Dr. Rexene Alberts (sleep)   I spent 30 minutes of face-to-face and non-face-to-face time with patient.  This included previsit chart review, lab review, study review, order entry, electronic health record documentation, patient education regarding ongoing compliance of CPAP for OSA management, review of compliance report, prior history of stroke and managing stroke risk factors and answered all questions to patient satisfaction    Frann Rider, AGNP-BC  [facility] [address] [place], [place] [address]  Phone [phone] Fax [phone] Note: This document was prepared with digital dictation and possible smart phrase technology. Any transcriptional errors that result from this process are unintentional.

## 2020-02-26 NOTE — Progress Notes (Signed)
Order for Cpap cupplies sent to [facility] via [employer]. Confirmation received that the order transmitted was successful.

## 2020-02-28 NOTE — Progress Notes (Signed)
I agree with the above plan 

## 2020-03-03 ENCOUNTER — Telehealth: Payer: Self-pay | Admitting: *Deleted

## 2020-03-03 NOTE — Telephone Encounter (Signed)
Schedule AWV.  

## 2020-03-10 DIAGNOSIS — G4733 Obstructive sleep apnea (adult) (pediatric): Secondary | ICD-10-CM | POA: Diagnosis not present

## 2020-03-13 DIAGNOSIS — G4733 Obstructive sleep apnea (adult) (pediatric): Secondary | ICD-10-CM | POA: Diagnosis not present

## 2020-04-08 DIAGNOSIS — N958 Other specified menopausal and perimenopausal disorders: Secondary | ICD-10-CM | POA: Diagnosis not present

## 2020-04-08 DIAGNOSIS — Z1231 Encounter for screening mammogram for malignant neoplasm of breast: Secondary | ICD-10-CM | POA: Diagnosis not present

## 2020-04-08 DIAGNOSIS — M8588 Other specified disorders of bone density and structure, other site: Secondary | ICD-10-CM | POA: Diagnosis not present

## 2020-04-14 DIAGNOSIS — G4733 Obstructive sleep apnea (adult) (pediatric): Secondary | ICD-10-CM | POA: Diagnosis not present

## 2020-05-20 ENCOUNTER — Other Ambulatory Visit: Payer: Self-pay | Admitting: Cardiology

## 2020-07-10 ENCOUNTER — Encounter: Payer: Self-pay | Admitting: Registered Nurse

## 2020-07-10 ENCOUNTER — Other Ambulatory Visit: Payer: Self-pay

## 2020-07-10 ENCOUNTER — Ambulatory Visit (INDEPENDENT_AMBULATORY_CARE_PROVIDER_SITE_OTHER): Payer: Medicare Other | Admitting: Registered Nurse

## 2020-07-10 VITALS — BP 129/68 | HR 75 | Temp 98.0°F | Resp 18 | Ht 63.0 in | Wt 190.2 lb

## 2020-07-10 DIAGNOSIS — M25511 Pain in right shoulder: Secondary | ICD-10-CM

## 2020-07-10 DIAGNOSIS — H6063 Unspecified chronic otitis externa, bilateral: Secondary | ICD-10-CM | POA: Diagnosis not present

## 2020-07-10 DIAGNOSIS — E7800 Pure hypercholesterolemia, unspecified: Secondary | ICD-10-CM

## 2020-07-10 DIAGNOSIS — M545 Low back pain, unspecified: Secondary | ICD-10-CM

## 2020-07-10 DIAGNOSIS — G8929 Other chronic pain: Secondary | ICD-10-CM

## 2020-07-10 DIAGNOSIS — I10 Essential (primary) hypertension: Secondary | ICD-10-CM | POA: Diagnosis not present

## 2020-07-10 LAB — CBC WITH DIFFERENTIAL
Basophils Absolute: 0.1 10*3/uL (ref 0.0–0.2)
Basos: 1 %
EOS (ABSOLUTE): 0.4 10*3/uL (ref 0.0–0.4)
Eos: 4 %
Hematocrit: 36.4 % (ref 34.0–46.6)
Hemoglobin: 11.6 g/dL (ref 11.1–15.9)
Immature Grans (Abs): 0 10*3/uL (ref 0.0–0.1)
Immature Granulocytes: 0 %
Lymphocytes Absolute: 1.9 10*3/uL (ref 0.7–3.1)
Lymphs: 23 %
MCH: 30.1 pg (ref 26.6–33.0)
MCHC: 31.9 g/dL (ref 31.5–35.7)
MCV: 95 fL (ref 79–97)
Monocytes Absolute: 0.6 10*3/uL (ref 0.1–0.9)
Monocytes: 8 %
Neutrophils Absolute: 5.4 10*3/uL (ref 1.4–7.0)
Neutrophils: 64 %
RBC: 3.85 x10E6/uL (ref 3.77–5.28)
RDW: 11.5 % — ABNORMAL LOW (ref 11.7–15.4)
WBC: 8.4 10*3/uL (ref 3.4–10.8)

## 2020-07-10 LAB — COMPREHENSIVE METABOLIC PANEL
ALT: 14 IU/L (ref 0–32)
AST: 17 IU/L (ref 0–40)
Albumin/Globulin Ratio: 1.4 (ref 1.2–2.2)
Albumin: 4.1 g/dL (ref 3.7–4.7)
Alkaline Phosphatase: 102 IU/L (ref 44–121)
BUN/Creatinine Ratio: 31 — ABNORMAL HIGH (ref 12–28)
BUN: 25 mg/dL (ref 8–27)
Bilirubin Total: 0.4 mg/dL (ref 0.0–1.2)
CO2: 25 mmol/L (ref 20–29)
Calcium: 9.7 mg/dL (ref 8.7–10.3)
Chloride: 102 mmol/L (ref 96–106)
Creatinine, Ser: 0.8 mg/dL (ref 0.57–1.00)
GFR calc Af Amer: 82 mL/min/{1.73_m2} (ref >59)
GFR calc non Af Amer: 71 mL/min/{1.73_m2} (ref >59)
Globulin, Total: 3 g/dL (ref 1.5–4.5)
Glucose: 104 mg/dL — ABNORMAL HIGH (ref 65–99)
Potassium: 4.4 mmol/L (ref 3.5–5.2)
Sodium: 139 mmol/L (ref 134–144)
Total Protein: 7.1 g/dL (ref 6.0–8.5)

## 2020-07-10 LAB — LIPID PANEL
Chol/HDL Ratio: 2.2 ratio (ref 0.0–4.4)
Cholesterol, Total: 143 mg/dL (ref 100–199)
HDL: 65 mg/dL (ref >39)
LDL Chol Calc (NIH): 65 mg/dL (ref 0–99)
Triglycerides: 63 mg/dL (ref 0–149)
VLDL Cholesterol Cal: 13 mg/dL (ref 5–40)

## 2020-07-10 MED ORDER — HYDROCORTISONE-ACETIC ACID 1-2 % OT SOLN: 3.0000 [drp] | Freq: Three times a day (TID) | OTIC | 0 refills | Status: DC

## 2020-07-10 NOTE — Patient Instructions (Signed)
° ° ° °  If you have lab work done today you will be contacted with your lab results within the next 2 weeks.  If you have not heard from us then please contact us. The fastest way to get your results is to register for My Chart. ° ° °IF you received an x-ray today, you will receive an invoice from [facility]. Please contact [facility] at [phone] with questions or concerns regarding your invoice.  ° °IF you received labwork today, you will receive an invoice from [facility]. Please contact [facility] at [phone] with questions or concerns regarding your invoice.  ° °Our billing staff will not be able to assist you with questions regarding bills from these companies. ° °You will be contacted with the lab results as soon as they are available. The fastest way to get your results is to activate your My Chart account. Instructions are located on the last page of this paperwork. If you have not heard from us regarding the results in 2 weeks, please contact this office. °  ° ° ° °

## 2020-07-10 NOTE — Progress Notes (Signed)
Established Patient Office Visit  Subjective:  Patient ID: Kerry [name], female    DOB: June 01, 1941  Age: 79 y.o. MRN: [id number]  CC:  Chief Complaint  Patient presents with  . Transitions Of Care    PAtient states she is here for an TOC. Patient would also would like to discss right arm and shoulder pain.    HPI Kerry [name] presents for visit to est care.   Formerly a patient of Dr. Nolon Rod.  Histories reviewed with patient, updated as warranted  Shoulder pain: ongoing for a few weeks, maybe around a month. Anterior aspect of R shoulder along joint line. Mild pain with internal rotation, otherwise ROM not limited actively or passively. 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 No hx of injury to this joint. occ mild numbness in R hand, but not much radiation otherwise.  Lower back pain: stands at job as a Scientist, water quality. Feels stiffness getting worse and worse. Takes tylenol with some relief. Does not do well with other analgesics. No sciatica. No saddle anesthesia. No history of injury to lower back.  Itchy ears: on and off for a number of months. No disruption to hearing. No pain. No drainage. No pressure. Has tried hydrocortisone which relieves itching but it soon returns. No other URI symptoms.   Past Medical History:  Diagnosis Date  . Allergy   . Anemia    in her 20's  . Aortic insufficiency    moderate by echo 08/2019  . Arthritis    not dx'd- just per pt   . Asthma    AS A CHILD  . Cancer [facility])    endometrial cancer with hysterectomy in 1987  . Cancer ([place])    melanoma on face   . Cataract    removed both eyes   . GERD (gastroesophageal reflux disease)    occ has with diet   . History of syncope   . HTN (hypertension)   . Hyperlipidemia   . TIA (transient ischemic attack)     Past Surgical History:  Procedure Laterality Date  . ABDOMINAL HYSTERECTOMY    . ANKLE FRACTURE SURGERY    . APPENDECTOMY    . CATARACT  EXTRACTION, BILATERAL    . CHOLECYSTECTOMY    . COLONOSCOPY  08/01/2007   Dr Henrene Pastor   . DENTAL SURGERY    . ovary removal    . right leg  fracture surgery       Family History  Problem Relation Age of Onset  . Mitral valve prolapse Daughter   . Heart disease Mother   . Kidney failure Mother   . Diabetes Mother   . Hyperlipidemia Mother   . Hypertension Mother   . Uterine cancer Mother   . Dementia Father   . Diabetes Sister   . Multiple sclerosis Sister   . Diabetes Paternal Grandmother   . Throat cancer Brother        smoker  . Colon cancer Neg Hx   . Colon polyps Neg Hx   . Esophageal cancer Neg Hx   . Rectal cancer Neg Hx   . Stomach cancer Neg Hx     Social History   Socioeconomic History  . Marital status: Widowed    Spouse name: Not on file  . Number of children: 2  . Years of education: Not on file  . Highest education level: Not on file  Occupational History  . Not on file  Tobacco  Use  . Smoking status: Never Smoker  . Smokeless tobacco: Never Used  Vaping Use  . Vaping Use: Never used  Substance and Sexual Activity  . Alcohol use: No  . Drug use: No  . Sexual activity: Not Currently  Other Topics Concern  . Not on file  Social History Narrative   Lives at home alone   1 cup of coffee per day    Works at [employer] Strain:   . Difficulty of Paying Living Expenses: Not on file  Food Insecurity:   . Worried About Charity fundraiser in the Last Year: Not on file  . Ran Out of Food in the Last Year: Not on file  Transportation Needs:   . Lack of Transportation (Medical): Not on file  . Lack of Transportation (Non-Medical): Not on file  Physical Activity:   . Days of Exercise per Week: Not on file  . Minutes of Exercise per Session: Not on file  Stress:   . Feeling of Stress : Not on file  Social Connections:   . Frequency of Communication with Friends and Family: Not on file  .  Frequency of Social Gatherings with Friends and Family: Not on file  . Attends Religious Services: Not on file  . Active Member of Clubs or Organizations: Not on file  . Attends Archivist Meetings: Not on file  . Marital Status: Not on file  Intimate Partner Violence:   . Fear of Current or Ex-Partner: Not on file  . Emotionally Abused: Not on file  . Physically Abused: Not on file  . Sexually Abused: Not on file    Outpatient Medications Prior to Visit  Medication Sig Dispense Refill  . acetaminophen (TYLENOL) 500 MG tablet Take 500 mg by mouth every 6 (six) hours as needed for moderate pain or headache.     . albuterol (VENTOLIN HFA) 108 (90 Base) MCG/ACT inhaler Inhale 2 puffs into the lungs every 6 (six) hours as needed for wheezing or shortness of breath. (Patient taking differently: Inhale 2 puffs into the lungs as needed for wheezing or shortness of breath. ) 1 Inhaler 0  . Ascorbic Acid (VITAMIN C PO) Take 1 tablet by mouth daily.    Marland Kitchen aspirin 81 MG chewable tablet Chew 81 mg by mouth daily. Every other day per pt    . atorvastatin (LIPITOR) 20 MG tablet TAKE 1 TABLET(20 MG) BY MOUTH DAILY 30 tablet 4  . Cholecalciferol (VITAMIN D3) 2000 units capsule Take 2,000 Units by mouth daily.    . Coenzyme Q10 (CO Q 10 PO) Take 1 capsule by mouth daily.     . fexofenadine (ALLEGRA) 180 MG tablet Take 180 mg by mouth daily.    . hydrochlorothiazide (HYDRODIURIL) 25 MG tablet Take 1 tablet (25 mg total) by mouth daily. 90 tablet 3  . lisinopril (ZESTRIL) 20 MG tablet Take 1 tablet (20 mg total) by mouth daily. 90 tablet 3  . Multiple Vitamin (MULTIVITAMIN) tablet Take 1 tablet by mouth daily.      . carvedilol (COREG) 12.5 MG tablet Take 1 tablet (12.5 mg total) by mouth 2 (two) times daily. 180 tablet 3   No facility-administered medications prior to visit.    Allergies  Allergen Reactions  . Lovastatin Other (See Comments)    myalgias  . Sudafed [Pseudoephedrine Hcl] Other  (See Comments)    Hallucinations     ROS Review  of Systems  Constitutional: Negative.   HENT: Negative.   Eyes: Negative.   Respiratory: Negative.   Cardiovascular: Negative.   Gastrointestinal: Negative.   Genitourinary: Negative.   Musculoskeletal: Positive for arthralgias and back pain.  Skin: Negative.   Neurological: Negative.   Psychiatric/Behavioral: Negative.       Objective:    Physical Exam Vitals and nursing note reviewed.  Constitutional:      General: She is not in acute distress.    Appearance: Normal appearance. She is normal weight. She is not ill-appearing, toxic-appearing or diaphoretic.  HENT:     Right Ear: Tympanic membrane and external ear normal. There is no impacted cerumen.     Left Ear: Tympanic membrane and external ear normal. There is no impacted cerumen.     Ears:     Comments: Mild erythema and flaking skin in both ear canals. Eyes:     Extraocular Movements: Extraocular movements intact.     Pupils: Pupils are equal, round, and reactive to light.  Cardiovascular:     Rate and Rhythm: Normal rate and regular rhythm.     Heart sounds: Normal heart sounds. No murmur heard.  No friction rub. No gallop.   Pulmonary:     Effort: Pulmonary effort is normal. No respiratory distress.     Breath sounds: Normal breath sounds. No stridor. No wheezing, rhonchi or rales.  Chest:     Chest wall: No tenderness.  Musculoskeletal:        General: Tenderness (r anterior joint line shoulder) present. No swelling, deformity or signs of injury. Normal range of motion.     Right lower leg: No edema.     Left lower leg: No edema.  Skin:    General: Skin is warm and dry.  Neurological:     General: No focal deficit present.     Mental Status: She is alert and oriented to person, place, and time. Mental status is at baseline.  Psychiatric:        Mood and Affect: Mood normal.        Behavior: Behavior normal.        Thought Content: Thought content  normal.        Judgment: Judgment normal.     BP 129/68   Pulse 75   Temp 98 F (36.7 C) (Temporal)   Resp 18   Ht 5\' 3"  (1.6 m)   Wt 190 lb 3.2 oz (86.3 kg)   SpO2 96%   BMI 33.69 kg/m  Wt Readings from Last 3 Encounters:  07/10/20 190 lb 3.2 oz (86.3 kg)  02/26/20 192 lb (87.1 kg)  11/28/19 196 lb (88.9 kg)     There are no preventive care reminders to display for this patient.  There are no preventive care reminders to display for this patient.  Lab Results  Component Value Date   TSH 2.330 02/22/2018   Lab Results  Component Value Date   WBC 10.6 06/27/2019   HGB 12.4 06/27/2019   HCT 38.5 06/27/2019   MCV 93 06/27/2019   PLT 311 06/27/2019   Lab Results  Component Value Date   NA 142 10/14/2019   K 4.2 10/14/2019   CO2 24 10/14/2019   GLUCOSE 99 10/14/2019   BUN 18 10/14/2019   CREATININE 0.77 10/14/2019   BILITOT 0.5 06/20/2019   ALKPHOS 106 06/20/2019   AST 30 06/20/2019   ALT 13 12/03/2019   PROT 7.9 06/20/2019   ALBUMIN 4.0 06/20/2019  CALCIUM 9.4 10/14/2019   ANIONGAP 11 06/21/2019   GFR 108.25 01/30/2015   Lab Results  Component Value Date   CHOL 133 12/03/2019   Lab Results  Component Value Date   HDL 58 12/03/2019   Lab Results  Component Value Date   LDLCALC 59 12/03/2019   Lab Results  Component Value Date   TRIG 85 12/03/2019   Lab Results  Component Value Date   CHOLHDL 2.3 12/03/2019   Lab Results  Component Value Date   HGBA1C 5.6 06/21/2019      Assessment & Plan:   Problem List Items Addressed This Visit      Other   Hypercholesterolemia   Relevant Orders   Lipid panel    Other Visit Diagnoses    Essential hypertension    -  Primary   Relevant Orders   CBC With Differential   Comprehensive metabolic panel   Chronic non-infective otitis externa of both ears, unspecified type       Relevant Medications   acetic acid-hydrocortisone (VOSOL-HC) OTIC solution   Acute pain of right shoulder        Chronic bilateral low back pain without sciatica       Relevant Orders   Ambulatory referral to Physical Therapy      No orders of the defined types were placed in this encounter.   Follow-up: No follow-ups on file.   PLAN  Refer to PT for back pain and shoulder pain  Continue with tylenol for analgesia  vosol-hc otic tid prn for noninfectious not suppurative otitis externa.  Labs collected, will follow up as warranted  Patient encouraged to call clinic with any questions, comments, or concerns.  Maximiano Coss, NP

## 2020-07-27 ENCOUNTER — Encounter: Payer: Self-pay | Admitting: Radiology

## 2020-07-28 ENCOUNTER — Other Ambulatory Visit: Payer: Self-pay | Admitting: Student

## 2020-08-03 DIAGNOSIS — G4733 Obstructive sleep apnea (adult) (pediatric): Secondary | ICD-10-CM | POA: Diagnosis not present

## 2020-08-10 ENCOUNTER — Ambulatory Visit: Payer: Medicare Other | Attending: Registered Nurse

## 2020-08-10 ENCOUNTER — Other Ambulatory Visit: Payer: Self-pay

## 2020-08-10 DIAGNOSIS — M6281 Muscle weakness (generalized): Secondary | ICD-10-CM | POA: Diagnosis not present

## 2020-08-10 DIAGNOSIS — R293 Abnormal posture: Secondary | ICD-10-CM | POA: Insufficient documentation

## 2020-08-10 DIAGNOSIS — M25611 Stiffness of right shoulder, not elsewhere classified: Secondary | ICD-10-CM | POA: Diagnosis not present

## 2020-08-10 DIAGNOSIS — M25511 Pain in right shoulder: Secondary | ICD-10-CM | POA: Diagnosis not present

## 2020-08-10 DIAGNOSIS — G8929 Other chronic pain: Secondary | ICD-10-CM | POA: Insufficient documentation

## 2020-08-10 DIAGNOSIS — M545 Low back pain, unspecified: Secondary | ICD-10-CM | POA: Diagnosis not present

## 2020-08-10 DIAGNOSIS — M2560 Stiffness of unspecified joint, not elsewhere classified: Secondary | ICD-10-CM | POA: Insufficient documentation

## 2020-08-10 NOTE — Therapy (Signed)
[place], [place], [address] Phone: [phone]   Fax:  [phone]  Physical Therapy Evaluation  Patient Details  Name: Kerry [name] MRN: [id number] Date of Birth: 12-07-1940 Referring Provider (PT): Maximiano Coss, NP   Encounter Date: 08/10/2020   PT End of Session - 08/10/20 0827    Visit Number 1    Number of Visits 12    Date for PT Re-Evaluation 09/18/20    Authorization Type BCBS MCR    PT Start Time 0830    PT Stop Time 0915    PT Time Calculation (min) 45 min    Activity Tolerance Patient tolerated treatment well    Behavior During Therapy [facility] for tasks assessed/performed           Past Medical History:  Diagnosis Date  . Allergy   . Anemia    in her 20's  . Aortic insufficiency    moderate by echo 08/2019  . Arthritis    not dx'd- just per pt   . Asthma    AS A CHILD  . Cancer [facility])    endometrial cancer with hysterectomy in 1987  . Cancer ([place])    melanoma on face   . Cataract    removed both eyes   . GERD (gastroesophageal reflux disease)    occ has with diet   . History of syncope   . HTN (hypertension)   . Hyperlipidemia   . TIA (transient ischemic attack)     Past Surgical History:  Procedure Laterality Date  . ABDOMINAL HYSTERECTOMY    . ANKLE FRACTURE SURGERY    . APPENDECTOMY    . CATARACT EXTRACTION, BILATERAL    . CHOLECYSTECTOMY    . COLONOSCOPY  08/01/2007   Dr Henrene Pastor   . DENTAL SURGERY    . ovary removal    . right leg  fracture surgery       There were no vitals filed for this visit.    Subjective Assessment - 08/10/20 0832    Subjective She reports  RT shoulder pain after lifting at work and this has been on /off  since the summer. She reports numbness of RT fingers.  This can be anytime.  LBP sore after sitting for ashile. Getting out of car has to pause to stand straight. LBP intermittant.  Sitting flexed is not a problem but sitting  straight  causes stiffness.    Pertinent History LT ankle OA    Limitations Lifting;Sitting   using RT arm for work.   How long can you sit comfortably? Depending on chair    How long can you stand comfortably? As needed    How long can you walk comfortably? As needed    Diagnostic tests none    Patient Stated Goals she wants to ease pain/ numbness    Currently in Pain? No/denies    Pain Score 4     Pain Location Shoulder    Pain Orientation Right;Anterior    Pain Descriptors / Indicators Sore    Pain Type Chronic pain    Pain Onset More than a month ago    Pain Frequency Intermittent    Aggravating Factors  using arms for work , RT arm on steering wheel    Pain Relieving Factors change position, creams, tylenol    Multiple Pain Sites Yes    Pain Location Back    Pain Orientation Right;Left    Pain Descriptors / Indicators Aching  Pain Type Chronic pain    Pain Onset More than a month ago    Pain Frequency Intermittent    Aggravating Factors  sitting    Pain Relieving Factors change position . move              [facility] PT Assessment - 08/10/20 0001      Assessment   Medical Diagnosis chronic LBP    Referring Provider (PT) Maximiano Coss, NP    Onset Date/Surgical Date --   months not specific   Next MD Visit As needed    Prior Therapy no      Precautions   Precautions None      Restrictions   Weight Bearing Restrictions No      Balance Screen   Has the patient fallen in the past 6 months No      Prior Function   Level of Independence Independent    Vocation Full time employment    Vocation Requirements use of arms and standing      Cognition   Overall Cognitive Status Within Functional Limits for tasks assessed      Observation/Other Assessments   Focus on Therapeutic Outcomes (FOTO)  77% for back no improvement expected,  58% for RT shoulder with 6 point improvement      Posture/Postural Control   Posture Comments rounded shoulder RT > LT       ROM /  Strength   AROM / PROM / Strength AROM;Strength      AROM   AROM Assessment Site Lumbar;Shoulder    Right/Left Shoulder Right    Right Shoulder Extension 45 Degrees    Right Shoulder Flexion 107 Degrees    Right Shoulder ABduction 112 Degrees    Right Shoulder Internal Rotation 55 Degrees    Right Shoulder External Rotation 45 Degrees    Right Shoulder Horizontal ABduction 0 Degrees    Right Shoulder Horizontal  ADduction 80 Degrees    Lumbar Flexion 100    Lumbar Extension 20    Lumbar - Right Side Bend 20    Lumbar - Left Side Bend 18      Strength   Overall Strength Comments Grossly LE WNL. LT ankle not tested.   Grossly RT shoulder WNL but with some pain.       Flexibility   Soft Tissue Assessment /Muscle Length yes                      Objective measurements completed on examination: See above findings.               PT Education - 08/10/20 0909    Education Details HEP , POC   FOTO score and possible progress    Person(s) Educated Patient    Methods Explanation;Demonstration;Verbal cues;Handout;Tactile cues    Comprehension Verbalized understanding;Returned demonstration            PT Short Term Goals - 08/10/20 5188      PT SHORT TERM GOAL #1   Title she will be indpendent with initial HEP    Time 3    Period Weeks    Status New      PT SHORT TERM GOAL #2   Title She will report 10-20% decr pain.    Time 3    Period Weeks    Status New      PT SHORT TERM GOAL #3   Title FOTO score will improve to   3 %  Time 3    Period Weeks    Status New             PT Long Term Goals - 08/10/20 1047      PT LONG TERM GOAL #1   Title She will be independent with all HEP issued    Time 6    Period Weeks    Status New      PT LONG TERM GOAL #2   Title FOTO score will improve by 6 points of more    Time 6    Period Weeks    Status New      PT LONG TERM GOAL #3   Title She will report able to drive without shoulder pain     Time 6    Period Weeks    Status New      PT LONG TERM GOAL #4   Title She will report using RT arm at work with min to no pain    Time 6    Period Weeks    Status New      PT LONG TERM GOAL #5   Title She will report pain in lower back eased with improved sitting posture    Time 6    Period Weeks    Status New                  Plan - 08/10/20 0827    Clinical Impression Statement Ms Kerry [name] presents with report of intermittant RT shoulder pain and LBP. LBP appears to be from extended posture in sitting that is resolved with moving about for a bit.  Her right shoulder pain appears to be from impingement due to forward shoulder posture and use of RT arm for work.  She should improve with skilled PT and consistent HEP  to be able to use her arm for work and home tasks.    Personal Factors and Comorbidities Age;Time since onset of injury/illness/exacerbation;Comorbidity 1    Comorbidities HX LT ankle fracture    Examination-Activity Limitations Reach Overhead;Lift;Sit    Examination-Participation Restrictions Occupation;Cleaning    Stability/Clinical Decision Making Stable/Uncomplicated    Clinical Decision Making Low    Rehab Potential Good    PT Frequency 2x / week    PT Duration 6 weeks    PT Treatment/Interventions Passive range of motion;Dry needling;Manual techniques;Patient/family education;Therapeutic activities;Therapeutic exercise;Electrical Stimulation;Moist Heat    PT Next Visit Plan progress HEP, manual and modalities    PT Home Exercise Plan scapula rtraction    Consulted and Agree with Plan of Care Patient           Patient will benefit from skilled therapeutic intervention in order to improve the following deficits and impairments:  Pain, Decreased range of motion, Decreased activity tolerance, Decreased strength, Postural dysfunction  Visit Diagnosis: Chronic bilateral low back pain without sciatica - Plan: PT plan of care cert/re-cert  Joint stiffness of  spine - Plan: PT plan of care cert/re-cert  Abnormal posture - Plan: PT plan of care cert/re-cert  Stiffness of right shoulder, not elsewhere classified - Plan: PT plan of care cert/re-cert  Chronic right shoulder pain - Plan: PT plan of care cert/re-cert     Problem List Patient Active Problem List   Diagnosis Date Noted  . Hyponatremia 06/21/2019  . TIA (transient ischemic attack) 06/20/2019  . Aortic insufficiency 08/17/2018  . Hyperlipidemia   . Right thyroid nodule 02/07/2013  . Right carotid bruit 07/16/2012  . Ocular migraine 11/04/2011  .  Hypercholesterolemia 06/01/2011  . HTN (hypertension)   . History of syncope   . Asthma     Darrel Hoover  PT 08/10/2020, 11:14 AM  [facility] [address] [place], [place], [address] Phone: [phone]   Fax:  [phone]  Name: Kerry [name] MRN: [id number] Date of Birth: 1940/10/15

## 2020-08-10 NOTE — Patient Instructions (Signed)
Scapula retraction x 2-3 reps 3-5 sec hold  6-8x/day

## 2020-08-17 ENCOUNTER — Other Ambulatory Visit: Payer: Self-pay | Admitting: Adult Health

## 2020-08-17 ENCOUNTER — Ambulatory Visit: Payer: Medicare Other | Admitting: Adult Health

## 2020-08-17 ENCOUNTER — Encounter: Payer: Self-pay | Admitting: Adult Health

## 2020-08-17 VITALS — BP 138/68 | HR 70 | Ht 63.0 in | Wt 186.0 lb

## 2020-08-17 DIAGNOSIS — Z6834 Body mass index (BMI) 34.0-34.9, adult: Secondary | ICD-10-CM | POA: Diagnosis not present

## 2020-08-17 DIAGNOSIS — Z01419 Encounter for gynecological examination (general) (routine) without abnormal findings: Secondary | ICD-10-CM | POA: Diagnosis not present

## 2020-08-17 DIAGNOSIS — G4733 Obstructive sleep apnea (adult) (pediatric): Secondary | ICD-10-CM | POA: Diagnosis not present

## 2020-08-17 DIAGNOSIS — G459 Transient cerebral ischemic attack, unspecified: Secondary | ICD-10-CM | POA: Diagnosis not present

## 2020-08-17 DIAGNOSIS — Z9989 Dependence on other enabling machines and devices: Secondary | ICD-10-CM | POA: Diagnosis not present

## 2020-08-17 MED ORDER — CARVEDILOL 12.5 MG PO TABS: 12.5000 mg | ORAL_TABLET | Freq: Two times a day (BID) | ORAL | 0 refills | Status: DC

## 2020-08-17 NOTE — Progress Notes (Signed)
Order for mask refitting with dreamware nasal pillows sent to [facility] via community msg. Confirmation received that the order transmitted was successful.

## 2020-08-17 NOTE — Progress Notes (Signed)
I agree with the above plan 

## 2020-08-17 NOTE — Progress Notes (Signed)
[facility] [address] [place]. [place] [address] [phone]       OFFICE FOLLOW UP NOTE  Kerry [name] Date of Birth:  [birth year] Medical Record Number:  [id number]    [place] stroke provider: Dr. Leonie Man [facility] sleep provider: Dr. Rexene Alberts   Reason for visit: TIA and sleep apnea follow-up    CHIEF COMPLAINT:  Chief Complaint  Patient presents with  . Follow-up    tx rm  . Transient Ischemic Attack    pt said she has right arm pain. Pt is starting PT  . Sleep Apnea    Difficulty tolerating mask    HPI:   Today, 08/17/2020, Kerry [name] returns for 49-month TIA and CPAP follow-up.  Stable from stroke/TIA standpoint continuing on aspirin and atorvastatin without side effects.  Blood pressure today 138/68. Monitors at home and has been stable.  Reports running out of carvedilol and does not have cardiology follow-up until next week and has been taking metoprolol which was leftover from a prior prescription.  Compliance report from 07/14/2020 -08/12/2020 shows 22 out of 30 usage days for 73% compliance and 19 days greater than 4 hours for 63% compliance.  Average usage 5 hours and 42 minutes.  Residual AHI 4.8.  Pressure setting min pressure 5 and max pressure 11 with pressure in the 95th percentile 10.8.  Leaks in the 95 percentile 45.7.  She has been experiencing difficulty tolerating due to frequent mask leaks and difficulty getting her nasal mask to correctly fit despite recently getting new supplies.  Also experiencing increase congestion and sinus pressure. ESS 7.  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 no further concerns at this time.     History provided for reference purposes only Update 02/26/2020 JM: Ms. Faulks is being seen for initial CPAP compliance visit with prior history of TIA. Compliance report reviewed from 01/26/2020 -02/24/2020 showing 28 out of 30 usage days  with 27 days greater than 4 hours for 90% compliance with average usage 6 hours and 59 minutes.  Residual AHI 2.1 with min pressure 5 cm H2O and max pressure of 11 cm H2O with EPR level 1.  Pressure in the 95th percentile 10.7.  Leaks in the 95th percentile 45.5 L/min. She does report mild improvement of daytime fatigue where she continues to work without difficulty and typically only becomes tired in the evening.  ESS 6 (reading, watching TV and laying down in afternoon).  She does report waking up with dry mouth in the morning as well as difficulty with allergies and increased congestion.  Reports occasional leaks when laying on her side but will subside after mask adjustment. She continues to follow with [facility] for needed supplies and is currently up to date on supplies. Stable from TIA standpoint without new or reoccurring stroke/TIA symptoms.  Remains on aspirin and atorvastatin for secondary stroke prevention.  Blood pressure today 133/71.  Continues to follow routinely with PCP/cardiology for HTN and HLD management.  No stroke/TIA related concerns at this time.   Update 11/28/2019 JM:: Kerry [name] is a 79 year old female who is being seen today, 11/28/2019, for TIA follow-up.  She has been doing well from a stroke standpoint reoccurring stroke/TIA symptoms.  She was referred to [facility] for suspected sleep apnea evaluated by Dr. Rexene Alberts and underwent sleep study on 10/13/2019 which did show mild sleep apnea with AHI 11.9/h and recommended initiation  of AutoPap.  Per review of phone conversation on 10/24/2019, patient wanted to further consider use of AutoPap and at this time, is interested in pursuing.  Continues on aspirin and recently initiated atorvastatin by cardiology for secondary stroke prevention without side effects.  Blood pressure today satisfactory at 124/58.  No further concerns at this time.  Initial visit 07/30/2019 JM: Kerry [name] is a 79 year old female who is being seen today for  hospital follow-up.  Has been stable since discharge without new or reoccurring stroke/TIA symptoms.  She has returned to work at [employer] without difficulty.  She does experience bilateral lower extremity pain and right shoulder pain which appears to be related to arthritis.  She does endorse daytime fatigue where she can easily lay down to take a nap or will fall asleep watching TV.  She does have to sit upright in a recliner while sleeping.  At times she will feel increased shortness of breath with exertion and is unsure if this is related to her asthma.  She does occasionally experience visual aura migraines 2-3 times per year with longstanding history of migraines.  She also experiences occasional double left frontal pain typically occurring with increased anxiety or stress.  Completed 3 weeks DAPT and continues on aspirin alone without bleeding or bruising.  Continues on Zetia without side effects.  Blood pressure today 145/69.  Follow-up with cardiology scheduled on 08/06/2019 to discuss 30-day cardiac event monitor.  Has appointment with a different cardiologist in December with patient reporting to follow-up regarding CHF.  No further concerns at this time.   Stroke admission 06/20/2019: Kerry [name] is a 79 y.o. female with history of HTN, HLD, migraines, endometrial cancer s/p TAH 1987  presented on 06/20/2019 with transient slurred speech with word finding difficulty and right sided numbness.    Stroke work-up completed with symptoms likely related to left brain cortical TIA which appeared embolic in origin.  MRI no acute stroke with evidence of small vessel disease.  MRA negative for LVO with mild arthrosclerosis and bilateral maxillary sinus fluid levels.  Carotid Doppler bilateral ICA 1 to 39% stenosis in VAs antegrade.  2D echo EF of 45 to 50% with some diastolic function but no evidence of cardiac source of embolus..  Lower extremity venous Dopplers negative for DVT.  Recommended loop  recorder placement to assess for atrial fibrillation but patient declined and was agreeable to do outpatient cardiac monitoring and recommend follow-up with cardiology outpatient to initiate.  LDL 91.  A1c 5.6.  Recommended DAPT for 3 weeks and aspirin alone.  HTN stable.  Initiated Zetia 10 mg daily with history of statin intolerance.  Other stroke risk factors include new combined systolic/diastolic CHF, advanced age and migraines but no prior history of stroke.  Discharged home in stable condition without therapy needs.    ROS:   14 system review of systems performed and negative with exception of those listed in HPI  PMH:  Past Medical History:  Diagnosis Date  . Allergy   . Anemia    in her 20's  . Aortic insufficiency    moderate by echo 08/2019  . Arthritis    not dx'd- just per pt   . Asthma    AS A CHILD  . Cancer [facility])    endometrial cancer with hysterectomy in 1987  . Cancer ([place])    melanoma on face   . Cataract    removed both eyes   . GERD (gastroesophageal reflux disease)  occ has with diet   . History of syncope   . HTN (hypertension)   . Hyperlipidemia   . TIA (transient ischemic attack)     PSH:  Past Surgical History:  Procedure Laterality Date  . ABDOMINAL HYSTERECTOMY    . ANKLE FRACTURE SURGERY    . APPENDECTOMY    . CATARACT EXTRACTION, BILATERAL    . CHOLECYSTECTOMY    . COLONOSCOPY  08/01/2007   Dr Henrene Pastor   . DENTAL SURGERY    . ovary removal    . right leg  fracture surgery       Social History:  Social History   Socioeconomic History  . Marital status: Widowed    Spouse name: Not on file  . Number of children: 2  . Years of education: Not on file  . Highest education level: Not on file  Occupational History  . Not on file  Tobacco Use  . Smoking status: Never Smoker  . Smokeless tobacco: Never Used  Vaping Use  . Vaping Use: Never used  Substance and Sexual Activity  . Alcohol use: No  . Drug use: No  . Sexual activity:  Not Currently  Other Topics Concern  . Not on file  Social History Narrative   Lives at home alone   1 cup of coffee per day    Works at [employer] Strain:   . Difficulty of Paying Living Expenses: Not on file  Food Insecurity:   . Worried About Charity fundraiser in the Last Year: Not on file  . Ran Out of Food in the Last Year: Not on file  Transportation Needs:   . Lack of Transportation (Medical): Not on file  . Lack of Transportation (Non-Medical): Not on file  Physical Activity:   . Days of Exercise per Week: Not on file  . Minutes of Exercise per Session: Not on file  Stress:   . Feeling of Stress : Not on file  Social Connections:   . Frequency of Communication with Friends and Family: Not on file  . Frequency of Social Gatherings with Friends and Family: Not on file  . Attends Religious Services: Not on file  . Active Member of Clubs or Organizations: Not on file  . Attends Archivist Meetings: Not on file  . Marital Status: Not on file  Intimate Partner Violence:   . Fear of Current or Ex-Partner: Not on file  . Emotionally Abused: Not on file  . Physically Abused: Not on file  . Sexually Abused: Not on file    Family History:  Family History  Problem Relation Age of Onset  . Mitral valve prolapse Daughter   . Heart disease Mother   . Kidney failure Mother   . Diabetes Mother   . Hyperlipidemia Mother   . Hypertension Mother   . Uterine cancer Mother   . Dementia Father   . Diabetes Sister   . Multiple sclerosis Sister   . Diabetes Paternal Grandmother   . Throat cancer Brother        smoker  . Colon cancer Neg Hx   . Colon polyps Neg Hx   . Esophageal cancer Neg Hx   . Rectal cancer Neg Hx   . Stomach cancer Neg Hx     Medications:   Current Outpatient Medications on File Prior to Visit  Medication Sig Dispense Refill  . acetaminophen (TYLENOL) 500 MG tablet  Take 500 mg by  mouth every 6 (six) hours as needed for moderate pain or headache.     Marland Kitchen acetic acid-hydrocortisone (VOSOL-HC) OTIC solution Place 3 drops into both ears 3 (three) times daily. 10 mL 0  . albuterol (VENTOLIN HFA) 108 (90 Base) MCG/ACT inhaler Inhale 2 puffs into the lungs every 6 (six) hours as needed for wheezing or shortness of breath. (Patient taking differently: Inhale 2 puffs into the lungs as needed for wheezing or shortness of breath. ) 1 Inhaler 0  . Ascorbic Acid (VITAMIN C PO) Take 1 tablet by mouth daily.    Marland Kitchen aspirin 81 MG chewable tablet Chew 81 mg by mouth daily. Every other day per pt    . atorvastatin (LIPITOR) 20 MG tablet TAKE 1 TABLET(20 MG) BY MOUTH DAILY 30 tablet 4  . Cholecalciferol (VITAMIN D3) 2000 units capsule Take 2,000 Units by mouth daily.    . Coenzyme Q10 (CO Q 10 PO) Take 1 capsule by mouth daily.     . fexofenadine (ALLEGRA) 180 MG tablet Take 180 mg by mouth daily.    . hydrochlorothiazide (HYDRODIURIL) 25 MG tablet Take 1 tablet (25 mg total) by mouth daily. 90 tablet 3  . lisinopril (ZESTRIL) 20 MG tablet Take 1 tablet (20 mg total) by mouth daily. 90 tablet 3  . Multiple Vitamin (MULTIVITAMIN) tablet Take 1 tablet by mouth daily.       No current facility-administered medications on file prior to visit.    Allergies:   Allergies  Allergen Reactions  . Lovastatin Other (See Comments)    myalgias  . Sudafed [Pseudoephedrine Hcl] Other (See Comments)    Hallucinations      Physical Exam  Vitals:   08/17/20 0936  BP: 138/68  Pulse: 70  Weight: 186 lb (84.4 kg)  Height: 5\' 3"  (1.6 m)   Body mass index is 32.95 kg/m. No exam data present  General: well developed, well nourished, very pleasant elderly Caucasian female, seated, in no evident distress Head: head normocephalic and atraumatic.   Neck: supple with no carotid or supraclavicular bruits Cardiovascular: regular rate and rhythm, no murmurs Musculoskeletal: Multiple bilateral hand  arthritic nodules Skin:  no rash/petichiae Vascular:  Normal pulses all extremities   Neurologic Exam Mental Status: Awake and fully alert.   Fluent speech and language. Oriented to place and time. Recent and remote memory intact. Attention span, concentration and fund of knowledge appropriate. Mood and affect appropriate.  Cranial Nerves: Pupils equal, briskly reactive to light. Extraocular movements full without nystagmus. Visual fields full to confrontation. Hearing intact. Facial sensation intact. Face, tongue, palate moves normally and symmetrically.  Motor: Normal bulk and tone. Normal strength in all tested extremity muscles. Sensory.: intact to touch , pinprick , position and vibratory sensation.  Coordination: Rapid alternating movements normal in all extremities. Finger-to-nose and heel-to-shin performed accurately bilaterally. Gait and Station: Arises from chair without difficulty. Stance is normal. Gait demonstrates normal stride length and balance without use of assistive device Reflexes: 1+ and symmetric. Toes downgoing.          ASSESSMENT/PLAN: Kerry [name] is a 79 y.o. year old female with underlying medical history of TIA 06/2019, CHF, HTN, HLD, migraines and OSA on CPAP.      1. OSA on CPAP: a. Difficulty tolerating current mask due to frequent leaks and uncomfortable fitting.  Reports currently using ResMed nose mask.  Leaks in the 95th percentile 45.7 with greater than 4-hour compliance 63% and residual AHI  4.8. b. Order placed to [facility] for mask refitting and possible use of DreamWear nasal pillow for better tolerance and improved compliance c. Continue current settings   2. Hx of TIA:  a. Stable without new recurrent stroke/TIA symptoms  b. continue aspirin 81 mg daily  and atorvastatin for secondary stroke prevention.    c. 30-day supply of carvedilol provided to patient until follow-up with cardiology next week (has been using prior  prescription of metoprolol) d. Discussed secondary stroke prevention measures and importance of close PCP and maintain strict control of hypertension with blood pressure goal below 130/90 and cholesterol with LDL cholesterol (bad cholesterol) goal below 70 mg/dL.     Follow-up in 6 months or call earlier if needed   CC:  Maximiano Coss, NP Dr. Leonie Man (stroke) Dr. Rexene Alberts (sleep)   I spent 40 minutes of face-to-face and non-face-to-face time with patient.  This included previsit chart review, lab review, study review, order entry, electronic health record documentation, patient education regarding ongoing review of CPAP compliance and further mask options, prior history of TIA and managing stroke risk factors and answered all other questions to patient satisfaction    Frann Rider, AGNP-BC  [facility] [address] [place],  [address]  Phone [phone] Fax [phone] Note: This document was prepared with digital dictation and possible smart phrase technology. Any transcriptional errors that result from this process are unintentional.

## 2020-08-17 NOTE — Patient Instructions (Signed)
Please follow up with [facility] regarding mask refitting as well as ongoing CPAP supplies  Continue aspirin 81 mg daily  and lipitor  for secondary stroke prevention  Continue to follow up with PCP regarding cholesterol and blood pressure management  Maintain strict control of hypertension with blood pressure goal below 130/90 and cholesterol with LDL cholesterol (bad cholesterol) goal below 70 mg/dL.       Followup in the future with me in 6 months or call earlier if needed       Thank you for coming to see [place] at [facility]. I hope we have been able to provide you high quality care today.  You may receive a patient satisfaction survey over the next few weeks. We would appreciate your feedback and comments so that we may continue to improve ourselves and the health of our patients.

## 2020-08-18 DIAGNOSIS — Z124 Encounter for screening for malignant neoplasm of cervix: Secondary | ICD-10-CM | POA: Diagnosis not present

## 2020-08-21 ENCOUNTER — Other Ambulatory Visit: Payer: Self-pay

## 2020-08-21 ENCOUNTER — Ambulatory Visit: Payer: Medicare Other | Attending: Registered Nurse

## 2020-08-21 DIAGNOSIS — M25611 Stiffness of right shoulder, not elsewhere classified: Secondary | ICD-10-CM | POA: Diagnosis not present

## 2020-08-21 DIAGNOSIS — G8929 Other chronic pain: Secondary | ICD-10-CM | POA: Insufficient documentation

## 2020-08-21 DIAGNOSIS — M2560 Stiffness of unspecified joint, not elsewhere classified: Secondary | ICD-10-CM | POA: Insufficient documentation

## 2020-08-21 DIAGNOSIS — R293 Abnormal posture: Secondary | ICD-10-CM | POA: Diagnosis not present

## 2020-08-21 DIAGNOSIS — M545 Low back pain, unspecified: Secondary | ICD-10-CM | POA: Diagnosis not present

## 2020-08-21 DIAGNOSIS — M25511 Pain in right shoulder: Secondary | ICD-10-CM | POA: Insufficient documentation

## 2020-08-21 NOTE — Therapy (Signed)
[place], [place], [address] Phone: [phone]   Fax:  [phone]  Physical Therapy Treatment  Patient Details  Name: Kerry [name] MRN: [id number] Date of Birth: 1941/02/07 Referring Provider (PT): Maximiano Coss, NP   Encounter Date: 08/21/2020   PT End of Session - 08/21/20 0842    Visit Number 2    Number of Visits 12    Date for PT Re-Evaluation 09/18/20    Authorization Type BCBS MCR FOTO at visit 6 and 10; Progress note visit 10    PT Start Time 0847    PT Stop Time 0927    PT Time Calculation (min) 40 min    Activity Tolerance Patient tolerated treatment well    Behavior During Therapy [facility] for tasks assessed/performed           Past Medical History:  Diagnosis Date  . Allergy   . Anemia    in her 20's  . Aortic insufficiency    moderate by echo 08/2019  . Arthritis    not dx'd- just per pt   . Asthma    AS A CHILD  . Cancer [facility])    endometrial cancer with hysterectomy in 1987  . Cancer ([place])    melanoma on face   . Cataract    removed both eyes   . GERD (gastroesophageal reflux disease)    occ has with diet   . History of syncope   . HTN (hypertension)   . Hyperlipidemia   . TIA (transient ischemic attack)     Past Surgical History:  Procedure Laterality Date  . ABDOMINAL HYSTERECTOMY    . ANKLE FRACTURE SURGERY    . APPENDECTOMY    . CATARACT EXTRACTION, BILATERAL    . CHOLECYSTECTOMY    . COLONOSCOPY  08/01/2007   Dr Henrene Pastor   . DENTAL SURGERY    . ovary removal    . right leg  fracture surgery       There were no vitals filed for this visit.   Subjective Assessment - 08/21/20 0847    Subjective She reports the pain comes and goes in the shoulder and the back, but the shoulder bothers her the most. She reports bending and sitting in a straight back chair bring on her back pain. She reports compliance with HEP. She reports the shoulder bothers her at work with  repetitive activity.    Pertinent History LT ankle OA    Limitations Lifting;Sitting   using RT arm for work.   How long can you sit comfortably? Depending on chair    How long can you stand comfortably? As needed    How long can you walk comfortably? As needed    Diagnostic tests none    Patient Stated Goals she wants to ease pain/ numbness    Currently in Pain? No/denies    Pain Score 0-No pain    Pain Onset More than a month ago    Pain Score 0    Pain Onset More than a month ago                             [facility] Adult PT Treatment/Exercise - 08/21/20 0001      Lumbar Exercises: Supine   Pelvic Tilt Limitations 2 x 10    Clam Limitations 2 x 10 yellow TB    Bridge Limitations 2 x 10    Other Supine  Lumbar Exercises calf stretch 1 x 30" LLE      Shoulder Exercises: Seated   Retraction Limitations 2 x 10    External Rotation Limitations 2 x 10 yellow TB      Shoulder Exercises: Stretch   Other Shoulder Stretches pec stretch doorway 1 x 30 sec    Other Shoulder Stretches upper trap stretch 1 x 20 sec each      Manual Therapy   Manual therapy comments Rt GH distraction, Rt shoulder flexion, abduction, ER, IR PROM to tolerance                  PT Education - 08/21/20 0845    Education Details updated HEP    Person(s) Educated Patient    Methods Explanation;Demonstration;Tactile cues;Verbal cues;Handout    Comprehension Verbalized understanding;Returned demonstration;Verbal cues required            PT Short Term Goals - 08/10/20 7341      PT SHORT TERM GOAL #1   Title she will be indpendent with initial HEP    Time 3    Period Weeks    Status New      PT SHORT TERM GOAL #2   Title She will report 10-20% decr pain.    Time 3    Period Weeks    Status New      PT SHORT TERM GOAL #3   Title FOTO score will improve to   3 %    Time 3    Period Weeks    Status New             PT Long Term Goals - 08/10/20 1047      PT LONG  TERM GOAL #1   Title She will be independent with all HEP issued    Time 6    Period Weeks    Status New      PT LONG TERM GOAL #2   Title FOTO score will improve by 6 points of more    Time 6    Period Weeks    Status New      PT LONG TERM GOAL #3   Title She will report able to drive without shoulder pain    Time 6    Period Weeks    Status New      PT LONG TERM GOAL #4   Title She will report using RT arm at work with min to no pain    Time 6    Period Weeks    Status New      PT LONG TERM GOAL #5   Title She will report pain in lower back eased with improved sitting posture    Time 6    Period Weeks    Status New                 Plan - 08/21/20 0846    Clinical Impression Statement Patient has difficulty relaxing with manual therapy, though reports that gentle Rt GH distraction "feels good." She has minor limitations into all planes of shoulder PROM with most notable restriction into shoulder ER with minor reports of anterior shoulder pain. Light progression of postural correctives with patient requiring cues to decrease excessive upper trap engagement with ability to correct once cued. Good tolerance to introduction of core and hip strengthening without reports of back pain.    Personal Factors and Comorbidities Age;Time since onset of injury/illness/exacerbation;Comorbidity 1    Comorbidities HX LT ankle fracture  Examination-Activity Limitations Reach Overhead;Lift;Sit    Examination-Participation Restrictions Occupation;Cleaning    Stability/Clinical Decision Making Stable/Uncomplicated    Rehab Potential Good    PT Treatment/Interventions Passive range of motion;Dry needling;Manual techniques;Patient/family education;Therapeutic activities;Therapeutic exercise;Electrical Stimulation;Moist Heat    PT Next Visit Plan review HEP, manual therapy as needed. education on proper lifitng/bending mechanics. progress core stabilization. progress shoulder strengthening  as tolerated focusing on persiscapular and rotator cuff.    PT Home Exercise Plan Access Code [phone]    Consulted and Agree with Plan of Care Patient           Patient will benefit from skilled therapeutic intervention in order to improve the following deficits and impairments:  Pain,Decreased range of motion,Decreased activity tolerance,Decreased strength,Postural dysfunction  Visit Diagnosis: Chronic bilateral low back pain without sciatica  Joint stiffness of spine  Abnormal posture  Stiffness of right shoulder, not elsewhere classified  Chronic right shoulder pain     Problem List Patient Active Problem List   Diagnosis Date Noted  . Hyponatremia 06/21/2019  . TIA (transient ischemic attack) 06/20/2019  . Aortic insufficiency 08/17/2018  . Hyperlipidemia   . Right thyroid nodule 02/07/2013  . Right carotid bruit 07/16/2012  . Ocular migraine 11/04/2011  . Hypercholesterolemia 06/01/2011  . HTN (hypertension)   . History of syncope   . Asthma    [name], PT, DPT, ATC 08/21/20 9:31 AM [facility] [address] [place], [place], [address] Phone: [phone]   Fax:  [phone]  Name: Kerry [name] MRN: [id number] Date of Birth: Jun 10, 1941

## 2020-08-25 ENCOUNTER — Encounter: Payer: Self-pay | Admitting: Cardiology

## 2020-08-25 ENCOUNTER — Other Ambulatory Visit: Payer: Self-pay

## 2020-08-25 ENCOUNTER — Ambulatory Visit: Payer: Medicare Other | Admitting: Cardiology

## 2020-08-25 VITALS — BP 130/60 | HR 75 | Ht 63.0 in | Wt 187.0 lb

## 2020-08-25 DIAGNOSIS — E7800 Pure hypercholesterolemia, unspecified: Secondary | ICD-10-CM

## 2020-08-25 DIAGNOSIS — Z87898 Personal history of other specified conditions: Secondary | ICD-10-CM | POA: Diagnosis not present

## 2020-08-25 DIAGNOSIS — I10 Essential (primary) hypertension: Secondary | ICD-10-CM | POA: Diagnosis not present

## 2020-08-25 DIAGNOSIS — I2510 Atherosclerotic heart disease of native coronary artery without angina pectoris: Secondary | ICD-10-CM | POA: Diagnosis not present

## 2020-08-25 DIAGNOSIS — I351 Nonrheumatic aortic (valve) insufficiency: Secondary | ICD-10-CM | POA: Diagnosis not present

## 2020-08-25 DIAGNOSIS — R0602 Shortness of breath: Secondary | ICD-10-CM

## 2020-08-25 DIAGNOSIS — I420 Dilated cardiomyopathy: Secondary | ICD-10-CM

## 2020-08-25 MED ORDER — CARVEDILOL 12.5 MG PO TABS: 12.5000 mg | ORAL_TABLET | Freq: Two times a day (BID) | ORAL | 3 refills | Status: DC

## 2020-08-25 NOTE — Progress Notes (Signed)
Cardiology Office Note:    Date:  08/25/2020   ID:  Kerry [name], DOB July 13, 1941, MRN [id number]  PCP:  Maximiano Coss, NP  Cardiologist:  No primary care provider on file.    Referring MD: Forrest Moron, MD   Chief Complaint  Patient presents with  . Coronary Artery Disease  . Hyperlipidemia  . Hypertension  . Cardiomyopathy  . Aortic Stenosis  . Aortic Insuffiency    History of Present Illness:    [name] is a 79 y.o. female with a hx of HTN, syncope and hyperlipidemia (statin intolerant). She also has a hx of CVA and loop recorder was recommended but patient did not want to proceed.   2D echo at that time showed mild LV dysfunction with EF 45-50% which was new and mild AS/AI.   She is here today for followup and is doing well.  She has chronic DOE that has not been felt to be cardiac related and it is stable with no change from when I saw her last.  She denies any chest pain or pressure,  PND, orthopnea, LE edema,palpitations or syncope. She works as a Scientist, water quality and sometimes when standing for long periods of time she will get dizzy.  She is compliant with her meds and is tolerating meds with no SE.    Past Medical History:  Diagnosis Date  . Allergy   . Anemia    in her 20's  . Aortic insufficiency    moderate by echo 08/2019  . Arthritis    not dx'd- just per pt   . Asthma    AS A CHILD  . Cancer [facility])    endometrial cancer with hysterectomy in 1987  . Cancer ([place])    melanoma on face   . Cataract    removed both eyes   . GERD (gastroesophageal reflux disease)    occ has with diet   . History of syncope   . HTN (hypertension)   . Hyperlipidemia   . TIA (transient ischemic attack)     Past Surgical History:  Procedure Laterality Date  . ABDOMINAL HYSTERECTOMY    . ANKLE FRACTURE SURGERY    . APPENDECTOMY    . CATARACT EXTRACTION, BILATERAL    . CHOLECYSTECTOMY    . COLONOSCOPY  08/01/2007   Dr Henrene Pastor   . DENTAL SURGERY    . ovary removal     . right leg  fracture surgery       Current Medications: Current Meds  Medication Sig  . acetaminophen (TYLENOL) 500 MG tablet Take 500 mg by mouth every 6 (six) hours as needed for moderate pain or headache.   Marland Kitchen acetic acid-hydrocortisone (VOSOL-HC) OTIC solution Place 3 drops into both ears 3 (three) times daily.  Marland Kitchen albuterol (VENTOLIN HFA) 108 (90 Base) MCG/ACT inhaler Inhale 2 puffs into the lungs every 6 (six) hours as needed for wheezing or shortness of breath. (Patient taking differently: No sig reported)  . Ascorbic Acid (VITAMIN C PO) Take 1 tablet by mouth daily.  Marland Kitchen aspirin 81 MG chewable tablet Chew 81 mg by mouth daily. Every other day per pt  . atorvastatin (LIPITOR) 20 MG tablet TAKE 1 TABLET(20 MG) BY MOUTH DAILY  . Cholecalciferol (VITAMIN D3) 2000 units capsule Take 2,000 Units by mouth daily.  . Coenzyme Q10 (CO Q 10 PO) Take 1 capsule by mouth daily.  . fexofenadine (ALLEGRA) 180 MG tablet Take 180 mg by mouth daily.  . hydrochlorothiazide (HYDRODIURIL) 25  MG tablet Take 1 tablet (25 mg total) by mouth daily.  Marland Kitchen lisinopril (ZESTRIL) 20 MG tablet Take 1 tablet (20 mg total) by mouth daily.  . Multiple Vitamin (MULTIVITAMIN) tablet Take 1 tablet by mouth daily.     Allergies:   Lovastatin and Sudafed [pseudoephedrine hcl]   Social History   Socioeconomic History  . Marital status: Widowed    Spouse name: Not on file  . Number of children: 2  . Years of education: Not on file  . Highest education level: Not on file  Occupational History  . Not on file  Tobacco Use  . Smoking status: Never Smoker  . Smokeless tobacco: Never Used  Vaping Use  . Vaping Use: Never used  Substance and Sexual Activity  . Alcohol use: No  . Drug use: No  . Sexual activity: Not Currently  Other Topics Concern  . Not on file  Social History Narrative   Lives at home alone   1 cup of coffee per day    Works at [employer] Strain: Not on file  Food Insecurity: Not on file  Transportation Needs: Not on file  Physical Activity: Not on file  Stress: Not on file  Social Connections: Not on file     Family History: The patient's family history includes Dementia in her father; Diabetes in her mother, paternal grandmother, and sister; Heart disease in her mother; Hyperlipidemia in her mother; Hypertension in her mother; Kidney failure in her mother; Mitral valve prolapse in her daughter; Multiple sclerosis in her sister; Throat cancer in her brother; Uterine cancer in her mother. There is no history of Colon cancer, Colon polyps, Esophageal cancer, Rectal cancer, or Stomach cancer.  ROS:   Please see the history of present illness.    ROS  All other systems reviewed and negative.   EKGs/Labs/Other Studies Reviewed:    The following studies were reviewed today:   EKG:  EKG is ordered today and showed NSR with nonspecific IVCD no change from EKG 06/2019  Recent Labs: 10/24/2019: NT-Pro BNP 356 07/10/2020: ALT 14; BUN 25; Creatinine, Ser 0.80; Hemoglobin 11.6; Potassium 4.4; Sodium 139   Recent Lipid Panel    Component Value Date/Time   CHOL 143 07/10/2020 1209   TRIG 63 07/10/2020 1209   HDL 65 07/10/2020 1209   CHOLHDL 2.2 07/10/2020 1209   CHOLHDL 3.0 06/21/2019 0531   VLDL 17 06/21/2019 0531   LDLCALC 65 07/10/2020 1209   LDLDIRECT 140.3 06/01/2011 1019    Physical Exam:    VS:  BP 130/60   Pulse 75   Ht 5\' 3"  (1.6 m)   Wt 187 lb (84.8 kg)   SpO2 99%   BMI 33.13 kg/m     Wt Readings from Last 3 Encounters:  08/25/20 187 lb (84.8 kg)  08/17/20 186 lb (84.4 kg)  07/10/20 190 lb 3.2 oz (86.3 kg)     GEN: Well nourished, well developed in no acute distress HEENT: Normal NECK: No JVD; No carotid bruits LYMPHATICS: No lymphadenopathy CARDIAC:RRR, no murmurs, rubs, gallops RESPIRATORY:  Clear to auscultation without rales, wheezing or rhonchi  ABDOMEN: Soft, non-tender,  non-distended MUSCULOSKELETAL:  No edema; No deformity  SKIN: Warm and dry NEUROLOGIC:  Alert and oriented x 3 PSYCHIATRIC:  Normal affect    ASSESSMENT:    1. Primary hypertension   2. History of syncope   3. Hypercholesterolemia   4. Nonrheumatic aortic  valve insufficiency   5. DCM (dilated cardiomyopathy) ([place])   6. Shortness of breath   7. Coronary artery disease involving native coronary artery of native heart without angina pectoris    PLAN:    In order of problems listed above:  1.  HTN  -BP is well controlled on exam today -continue Lisinopril 20mg  daily, HCTZ 25mg  daily and Carvedilol 12.5mg  BID(refilled) -SCr was normal at 0.8 and K+ 4.4 in Oct 2021  2.  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  Hyperlipidemia -she is statin intolerant -continue zetia 10mg  daily -LDL was 65 in Oct 2021  4.  Aortic insufficiency/stenosis - 2D echo 08/2019 showed moderate AI and mild AS -repeat echo to make sure AI is stable  5.  Mild LV dysfunction -EF noted to be mildly decreased at 45-50% in October at time of her CVA and by repet echo 08/2019  6.  ASCAD -she has chronic DOE that is not felt to be related to CAD -Coronary CTA 10/2019 showed a coronary Ca score of 37 which was 40th % for age and sex matched controls and mild non obstructive plaque in the proximal LAD and RCA -she has not had any chest pain and SOB is stable -continue ASA 81mg  daily, carvedilol 12.5mg  BID and statin  7.  Nonsustained atrial tachycardia -no further palpitations -she ran out of carvedilol and I will refill this  Medication Adjustments/Labs and  Tests Ordered: Current medicines are reviewed at length with the patient today.  Concerns regarding medicines are outlined above.  Orders Placed This Encounter  Procedures  . EKG 12-Lead   Meds ordered this encounter  Medications  . carvedilol (COREG) 12.5 MG tablet    Sig: Take 1 tablet (12.5 mg total) by mouth 2 (two) times daily.    Dispense:  180 tablet    Refill:  3    Signed, Fransico Him, MD  08/25/2020 9:25 AM     [facility]

## 2020-08-25 NOTE — Patient Instructions (Signed)

## 2020-08-25 NOTE — Addendum Note (Signed)
Addended by: Antonieta Iba on: 08/25/2020 09:33 AM   Modules accepted: Orders

## 2020-08-28 ENCOUNTER — Ambulatory Visit: Payer: Medicare Other | Admitting: Physical Therapy

## 2020-08-28 ENCOUNTER — Encounter: Payer: Self-pay | Admitting: Physical Therapy

## 2020-08-28 ENCOUNTER — Other Ambulatory Visit: Payer: Self-pay

## 2020-08-28 DIAGNOSIS — R293 Abnormal posture: Secondary | ICD-10-CM

## 2020-08-28 DIAGNOSIS — M2560 Stiffness of unspecified joint, not elsewhere classified: Secondary | ICD-10-CM | POA: Diagnosis not present

## 2020-08-28 DIAGNOSIS — M25611 Stiffness of right shoulder, not elsewhere classified: Secondary | ICD-10-CM | POA: Diagnosis not present

## 2020-08-28 DIAGNOSIS — M25511 Pain in right shoulder: Secondary | ICD-10-CM

## 2020-08-28 DIAGNOSIS — M545 Low back pain, unspecified: Secondary | ICD-10-CM

## 2020-08-28 DIAGNOSIS — G8929 Other chronic pain: Secondary | ICD-10-CM

## 2020-08-28 NOTE — Therapy (Signed)
[place], [place], [address] Phone: [phone]   Fax:  [phone]  Physical Therapy Treatment  Patient Details  Name: Kerry [name] MRN: [id number] Date of Birth: 09-14-1940 Referring Provider (PT): Maximiano Coss, NP   Encounter Date: 08/28/2020   PT End of Session - 08/28/20 0809    Visit Number 3    Number of Visits 12    Date for PT Re-Evaluation 09/18/20    Authorization Type BCBS MCR FOTO at visit 6 and 10; Progress note visit 10    PT Start Time 0800    PT Stop Time 0840    PT Time Calculation (min) 40 min           Past Medical History:  Diagnosis Date  . Allergy   . Anemia    in her 20's  . Aortic insufficiency    moderate by echo 08/2019  . Arthritis    not dx'd- just per pt   . Asthma    AS A CHILD  . Cancer [facility])    endometrial cancer with hysterectomy in 1987  . Cancer ([place])    melanoma on face   . Cataract    removed both eyes   . GERD (gastroesophageal reflux disease)    occ has with diet   . History of syncope   . HTN (hypertension)   . Hyperlipidemia   . TIA (transient ischemic attack)     Past Surgical History:  Procedure Laterality Date  . ABDOMINAL HYSTERECTOMY    . ANKLE FRACTURE SURGERY    . APPENDECTOMY    . CATARACT EXTRACTION, BILATERAL    . CHOLECYSTECTOMY    . COLONOSCOPY  08/01/2007   Dr Henrene Pastor   . DENTAL SURGERY    . ovary removal    . right leg  fracture surgery       There were no vitals filed for this visit.   Subjective Assessment - 08/28/20 0805    Subjective This week has been alot of unpacking boxes and placing clothes on hangers at work. I get small jabs of pain intermittently and I also have difficulty getting up and going after sitting.    Limitations Lifting;Sitting    Currently in Pain? No/denies                             [facility] Adult PT Treatment/Exercise - 08/28/20 0001      Lumbar Exercises: Seated    Sit to Stand 10 reps    Sit to Stand Limitations cues for neutral LE and hp hinge      Lumbar Exercises: Supine   Pelvic Tilt Limitations 2 x 10    Bridge Limitations 2 x 10    Other Supine Lumbar Exercises seated DF with strap 3 x 20 sec      Lumbar Exercises: Sidelying   Clam 10 reps    Hip Abduction 10 reps      Shoulder Exercises: Seated   External Rotation Limitations 2 x 10 red TB      Shoulder Exercises: Standing   Row 20 reps    Theraband Level (Shoulder Row) Level 2 (Red)      Shoulder Exercises: Stretch   Other Shoulder Stretches pec stretch doorway 1 x 30 sec    Other Shoulder Stretches upper trap stretch 2 x 20 sec each, levator stretch 1 x 20 sec  PT Education - 08/28/20 0846    Education Details HEP    Person(s) Educated Patient    Methods Explanation;Handout    Comprehension Verbalized understanding            PT Short Term Goals - 08/10/20 0828      PT SHORT TERM GOAL #1   Title she will be indpendent with initial HEP    Time 3    Period Weeks    Status New      PT SHORT TERM GOAL #2   Title She will report 10-20% decr pain.    Time 3    Period Weeks    Status New      PT SHORT TERM GOAL #3   Title FOTO score will improve to   3 %    Time 3    Period Weeks    Status New             PT Long Term Goals - 08/10/20 1047      PT LONG TERM GOAL #1   Title She will be independent with all HEP issued    Time 6    Period Weeks    Status New      PT LONG TERM GOAL #2   Title FOTO score will improve by 6 points of more    Time 6    Period Weeks    Status New      PT LONG TERM GOAL #3   Title She will report able to drive without shoulder pain    Time 6    Period Weeks    Status New      PT LONG TERM GOAL #4   Title She will report using RT arm at work with min to no pain    Time 6    Period Weeks    Status New      PT LONG TERM GOAL #5   Title She will report pain in lower back eased with improved  sitting posture    Time 6    Period Weeks    Status New                 Plan - 08/28/20 0809    Clinical Impression Statement Pt reports intermittent compliance with HEP. She reports difficulty with unpacking boxed inventory at work which causes intermittent back pain. Reviewed HEP and progressed with sit-stands focusing on alignement of spine and joints. She has difficulty maintaining hip and knee alignement with sit-stand, tending to touch knees together. Also noted tenderness at lateral hips with laying sidelying. Began open chain lateral hip strength.    PT Next Visit Plan review HEP, manual therapy as needed. education on proper lifitng/bending mechanics. progress core stabilization. progress shoulder strengthening as tolerated focusing on persiscapular and rotator cuff. Try standing or supine  hip abduction/ER strength    PT Home Exercise Plan Access Code [email or web address]           Patient will benefit from skilled therapeutic intervention in order to improve the following deficits and impairments:  Pain,Decreased range of motion,Decreased activity tolerance,Decreased strength,Postural dysfunction  Visit Diagnosis: Chronic bilateral low back pain without sciatica  Joint stiffness of spine  Abnormal posture  Stiffness of right shoulder, not elsewhere classified  Chronic right shoulder pain     Problem List Patient Active Problem List   Diagnosis Date Noted  . Hyponatremia 06/21/2019  . TIA (transient ischemic attack) 06/20/2019  . Aortic insufficiency 08/17/2018  .  Hyperlipidemia   . Right thyroid nodule 02/07/2013  . Right carotid bruit 07/16/2012  . Ocular migraine 11/04/2011  . Hypercholesterolemia 06/01/2011  . HTN (hypertension)   . History of syncope   . Asthma     [name], [place] 08/28/2020, 8:51 AM  [facility] [address] [place], [place], [address] Phone: [phone]   Fax:   [phone]  Name: Kerry [name] MRN: [id number] Date of Birth: 1941-07-15

## 2020-08-28 NOTE — Patient Instructions (Signed)
Access Code: [email or web address] URL: [email or web address] Date: 08/28/2020 Prepared by: Hessie Diener  Exercises Added  Sit to Stand - 2 x daily - 7 x weekly - 1 sets - 10 reps

## 2020-09-01 ENCOUNTER — Other Ambulatory Visit: Payer: Self-pay | Admitting: Cardiology

## 2020-09-02 ENCOUNTER — Other Ambulatory Visit: Payer: Self-pay

## 2020-09-02 ENCOUNTER — Ambulatory Visit: Payer: Medicare Other | Admitting: Physical Therapy

## 2020-09-02 ENCOUNTER — Encounter: Payer: Self-pay | Admitting: Physical Therapy

## 2020-09-02 DIAGNOSIS — R293 Abnormal posture: Secondary | ICD-10-CM | POA: Diagnosis not present

## 2020-09-02 DIAGNOSIS — M2560 Stiffness of unspecified joint, not elsewhere classified: Secondary | ICD-10-CM | POA: Diagnosis not present

## 2020-09-02 DIAGNOSIS — G8929 Other chronic pain: Secondary | ICD-10-CM | POA: Diagnosis not present

## 2020-09-02 DIAGNOSIS — M25511 Pain in right shoulder: Secondary | ICD-10-CM | POA: Diagnosis not present

## 2020-09-02 DIAGNOSIS — M545 Low back pain, unspecified: Secondary | ICD-10-CM

## 2020-09-02 DIAGNOSIS — M25611 Stiffness of right shoulder, not elsewhere classified: Secondary | ICD-10-CM

## 2020-09-02 NOTE — Therapy (Signed)
[facility] [address] [place], [name], [address] Phone: [phone]   Fax:  [phone]  Physical Therapy Treatment  Patient Details  Name: Kerry [name] MRN: [id number] Date of Birth: 1941-05-13 Referring Provider (PT): Janeece Agee, NP   Encounter Date: 09/02/2020   PT End of Session - 09/02/20 1240    Visit Number 4    Number of Visits 12    Date for PT Re-Evaluation 09/18/20    Authorization Type BCBS MCR FOTO at visit 6 and 10; Progress note visit 10    PT Start Time 1235    PT Stop Time 1315    PT Time Calculation (min) 40 min           Past Medical History:  Diagnosis Date  . Allergy   . Anemia    in her 20's  . Aortic insufficiency    moderate by echo 08/2019  . Arthritis    not dx'd- just per pt   . Asthma    AS A CHILD  . Cancer [facility])    endometrial cancer with hysterectomy in 1987  . Cancer ([facility])    melanoma on face   . Cataract    removed both eyes   . GERD (gastroesophageal reflux disease)    occ has with diet   . History of syncope   . HTN (hypertension)   . Hyperlipidemia   . TIA (transient ischemic attack)     Past Surgical History:  Procedure Laterality Date  . ABDOMINAL HYSTERECTOMY    . ANKLE FRACTURE SURGERY    . APPENDECTOMY    . CATARACT EXTRACTION, BILATERAL    . CHOLECYSTECTOMY    . COLONOSCOPY  08/01/2007   Dr Marina Goodell   . DENTAL SURGERY    . ovary removal    . right leg  fracture surgery       There were no vitals filed for this visit.   Subjective Assessment - 09/02/20 1240    Subjective The back has not been as bad.    Currently in Pain? No/denies                             [facility] Adult PT Treatment/Exercise - 09/02/20 0001      Lumbar Exercises: Standing   Other Standing Lumbar Exercises hip abduction and hip flexion x 10 each standing at free motion bar      Lumbar Exercises: Seated   Sit to Stand 10 reps    Sit to Stand Limitations  cues for neutral LE and hp hinge      Lumbar Exercises: Supine   Pelvic Tilt Limitations 2 x 10    Clam Limitations 2 x 10 red    Bridge with clamshell 10 reps   red band   Other Supine Lumbar Exercises standing slant board x 2      Lumbar Exercises: Sidelying   Clam 10 reps    Clam Limitations red band    Hip Abduction 10 reps      Shoulder Exercises: Seated   External Rotation Limitations 2 x 10 red TB      Shoulder Exercises: Standing   Flexion 10 reps    Theraband Level (Shoulder Flexion) Level 2 (Red)    Row 20 reps    Theraband Level (Shoulder Row) Level 2 (Red)      Shoulder Exercises: Stretch   Other Shoulder Stretches pec stretch doorway 1 x  30 sec                    PT Short Term Goals - 08/10/20 6237      PT SHORT TERM GOAL #1   Title she will be indpendent with initial HEP    Time 3    Period Weeks    Status New      PT SHORT TERM GOAL #2   Title She will report 10-20% decr pain.    Time 3    Period Weeks    Status New      PT SHORT TERM GOAL #3   Title FOTO score will improve to   3 %    Time 3    Period Weeks    Status New             PT Long Term Goals - 08/10/20 1047      PT LONG TERM GOAL #1   Title She will be independent with all HEP issued    Time 6    Period Weeks    Status New      PT LONG TERM GOAL #2   Title FOTO score will improve by 6 points of more    Time 6    Period Weeks    Status New      PT LONG TERM GOAL #3   Title She will report able to drive without shoulder pain    Time 6    Period Weeks    Status New      PT LONG TERM GOAL #4   Title She will report using RT arm at work with min to no pain    Time 6    Period Weeks    Status New      PT LONG TERM GOAL #5   Title She will report pain in lower back eased with improved sitting posture    Time 6    Period Weeks    Status New                 Plan - 09/02/20 1259    Clinical Impression Statement Pt reports more compliance with HEP.  SHe notes improved strength with sit-stand transitions. She has no pain today and has just left work. Continued with hip core and right shoulder strength. Reviewed neck and shoulder stretches. Good tolerance to treatment.    PT Next Visit Plan review HEP, manual therapy as needed. education on proper lifitng/bending mechanics. progress core stabilization. progress shoulder strengthening as tolerated focusing on persiscapular and rotator cuff.  standing or supine  hip abduction/ER strength    PT Home Exercise Plan Access Code [email or web address]           Patient will benefit from skilled therapeutic intervention in order to improve the following deficits and impairments:  Pain,Decreased range of motion,Decreased activity tolerance,Decreased strength,Postural dysfunction  Visit Diagnosis: Chronic bilateral low back pain without sciatica  Joint stiffness of spine  Abnormal posture  Stiffness of right shoulder, not elsewhere classified  Chronic right shoulder pain     Problem List Patient Active Problem List   Diagnosis Date Noted  . Hyponatremia 06/21/2019  . TIA (transient ischemic attack) 06/20/2019  . Aortic insufficiency 08/17/2018  . Hyperlipidemia   . Right thyroid nodule 02/07/2013  . Right carotid bruit 07/16/2012  . Ocular migraine 11/04/2011  . Hypercholesterolemia 06/01/2011  . HTN (hypertension)   . History of syncope   . Asthma  [name] , [place] 09/02/2020, 1:35 PM  [place], [place], [address] Phone: [phone]   Fax:  [phone]  Name: ZAKKIYYA BARNO MRN: [id number] Date of Birth: 1941/03/06

## 2020-09-10 ENCOUNTER — Ambulatory Visit: Payer: Medicare Other

## 2020-09-10 ENCOUNTER — Other Ambulatory Visit: Payer: Self-pay

## 2020-09-10 DIAGNOSIS — M25511 Pain in right shoulder: Secondary | ICD-10-CM

## 2020-09-10 DIAGNOSIS — M25611 Stiffness of right shoulder, not elsewhere classified: Secondary | ICD-10-CM | POA: Diagnosis not present

## 2020-09-10 DIAGNOSIS — R293 Abnormal posture: Secondary | ICD-10-CM

## 2020-09-10 DIAGNOSIS — M545 Low back pain, unspecified: Secondary | ICD-10-CM | POA: Diagnosis not present

## 2020-09-10 DIAGNOSIS — G8929 Other chronic pain: Secondary | ICD-10-CM

## 2020-09-10 DIAGNOSIS — M2560 Stiffness of unspecified joint, not elsewhere classified: Secondary | ICD-10-CM | POA: Diagnosis not present

## 2020-09-11 NOTE — Therapy (Signed)
[facility] [address] [place], [name], [address] Phone: [phone]   Fax:  [phone]  Physical Therapy Treatment  Patient Details  Name: Kerry [name] MRN: [id number] Date of Birth: May 22, 1941 Referring Provider (PT): Janeece Agee, NP   Encounter Date: 09/10/2020   PT End of Session - 09/10/20 1850    Visit Number 5    Number of Visits 12    Date for PT Re-Evaluation 09/18/20    Authorization Type BCBS MCR FOTO at visit 6 and 10; Progress note visit 10    PT Start Time 1847    PT Stop Time 1929    PT Time Calculation (min) 42 min    Activity Tolerance Patient tolerated treatment well    Behavior During Therapy [facility] for tasks assessed/performed           Past Medical History:  Diagnosis Date  . Allergy   . Anemia    in her 20's  . Aortic insufficiency    moderate by echo 08/2019  . Arthritis    not dx'd- just per pt   . Asthma    AS A CHILD  . Cancer [facility])    endometrial cancer with hysterectomy in 1987  . Cancer (HCC)    melanoma on face   . Cataract    removed both eyes   . GERD (gastroesophageal reflux disease)    occ has with diet   . History of syncope   . HTN (hypertension)   . Hyperlipidemia   . TIA (transient ischemic attack)     Past Surgical History:  Procedure Laterality Date  . ABDOMINAL HYSTERECTOMY    . ANKLE FRACTURE SURGERY    . APPENDECTOMY    . CATARACT EXTRACTION, BILATERAL    . CHOLECYSTECTOMY    . COLONOSCOPY  08/01/2007   Dr Marina Goodell   . DENTAL SURGERY    . ovary removal    . right leg  fracture surgery       There were no vitals filed for this visit.   Subjective Assessment - 09/10/20 1847    Subjective her back is ok, but sometimes she gets a twinge. patient reports she is completing her exercises daily. Patient denies any pain currently, but feels that the shoulder is the most bothersome. She reports lately her foot has been hurting from walking.    Pertinent  History LT ankle OA    Currently in Pain? No/denies              [facility] PT Assessment - 09/11/20 0001      AROM   Overall AROM Comments pn! with Rt shoulder abduction, ER, and flexion    Right Shoulder Flexion 115 Degrees    Right Shoulder ABduction 120 Degrees    Right Shoulder Internal Rotation 70 Degrees    Right Shoulder External Rotation 50 Degrees      Strength   Overall Strength Comments Rt ER IR shoulder 4-/5, scaption 4-/5      Special Tests   Other special tests (+) Empty can                         [facility] Adult PT Treatment/Exercise - 09/11/20 0001      Lumbar Exercises: Supine   Clam Limitations 2 x 10 blue TB      Shoulder Exercises: Supine   Flexion Limitations with dowel 2 x 10    ABduction Limitations horizontal abduction 2 x  10 yellow TB    Other Supine Exercises chest press wth dowel 2 x 10      Shoulder Exercises: Sidelying   External Rotation Limitations 2 x 10 Rt AROM      Shoulder Exercises: Standing   Row Limitations 2 x 10 low row yellow TB      Manual Therapy   Manual therapy comments Rt GH PROM to tolerance, STM to bicep, deltoid, pec, and upper trap RUE                  PT Education - 09/10/20 1917    Education Details Education on recommendation to f/u with orthopedic specialist for Rt shoulder    Person(s) Educated Patient    Methods Explanation    Comprehension Verbalized understanding            PT Short Term Goals - 08/10/20 [id number]      PT SHORT TERM GOAL #1   Title she will be indpendent with initial HEP    Time 3    Period Weeks    Status New      PT SHORT TERM GOAL #2   Title She will report 10-20% decr pain.    Time 3    Period Weeks    Status New      PT SHORT TERM GOAL #3   Title FOTO score will improve to   3 %    Time 3    Period Weeks    Status New             PT Long Term Goals - 08/10/20 1047      PT LONG TERM GOAL #1   Title She will be independent with all HEP issued     Time 6    Period Weeks    Status New      PT LONG TERM GOAL #2   Title FOTO score will improve by 6 points of more    Time 6    Period Weeks    Status New      PT LONG TERM GOAL #3   Title She will report able to drive without shoulder pain    Time 6    Period Weeks    Status New      PT LONG TERM GOAL #4   Title She will report using RT arm at work with min to no pain    Time 6    Period Weeks    Status New      PT LONG TERM GOAL #5   Title She will report pain in lower back eased with improved sitting posture    Time 6    Period Weeks    Status New                 Plan - 09/10/20 1918    Clinical Impression Statement Session today focused primarily on Rt shoulder as patient feels her Rt shoulder is more limiting her functional abilities currently. Patient noted to have mild improvements in Rt shoulder AROM compared to initial evaluation, though she demonstrates signs and symptoms that are consistent with potential rotator cuff pathology and was recommended to f/u with ortho while continuing with current POC as she has only attended 5 sessions of PT at this time. Overall good tolerance to shoulder ROM and strengthening exercises requiring minimal postural cues.    Comorbidities HX LT ankle fracture    PT Next Visit Plan update HEP. Progress shoulder ROM  and periscapular/rotator cuff strengthening. Progress standing hip abductor strengthening as tolerated.    PT Home Exercise Plan Access Code [phone]    Recommended Other Services ortho    Consulted and Agree with Plan of Care Patient           Patient will benefit from skilled therapeutic intervention in order to improve the following deficits and impairments:  Pain,Decreased range of motion,Decreased activity tolerance,Decreased strength,Postural dysfunction  Visit Diagnosis: Chronic bilateral low back pain without sciatica  Joint stiffness of spine  Abnormal posture  Stiffness of right shoulder, not  elsewhere classified  Chronic right shoulder pain     Problem List Patient Active Problem List   Diagnosis Date Noted  . Hyponatremia 06/21/2019  . TIA (transient ischemic attack) 06/20/2019  . Aortic insufficiency 08/17/2018  . Hyperlipidemia   . Right thyroid nodule 02/07/2013  . Right carotid bruit 07/16/2012  . Ocular migraine 11/04/2011  . Hypercholesterolemia 06/01/2011  . HTN (hypertension)   . History of syncope   . Asthma   [name], PT, DPT, ATC 09/11/20 7:41 AM  [facility] [address] [place], [place], [address] Phone: [phone]   Fax:  [phone]  Name: Kerry [name] MRN: [id number] Date of Birth: 08-04-1941

## 2020-09-16 ENCOUNTER — Other Ambulatory Visit: Payer: Self-pay

## 2020-09-16 ENCOUNTER — Ambulatory Visit: Payer: Medicare Other | Attending: Registered Nurse

## 2020-09-16 DIAGNOSIS — M545 Low back pain, unspecified: Secondary | ICD-10-CM | POA: Diagnosis not present

## 2020-09-16 DIAGNOSIS — G8929 Other chronic pain: Secondary | ICD-10-CM | POA: Insufficient documentation

## 2020-09-16 DIAGNOSIS — M25611 Stiffness of right shoulder, not elsewhere classified: Secondary | ICD-10-CM | POA: Diagnosis not present

## 2020-09-16 DIAGNOSIS — M25511 Pain in right shoulder: Secondary | ICD-10-CM | POA: Diagnosis not present

## 2020-09-16 DIAGNOSIS — M2560 Stiffness of unspecified joint, not elsewhere classified: Secondary | ICD-10-CM | POA: Diagnosis not present

## 2020-09-16 DIAGNOSIS — R293 Abnormal posture: Secondary | ICD-10-CM | POA: Diagnosis not present

## 2020-09-16 NOTE — Therapy (Addendum)
Kerry [name], [place], [address] Phone: [phone]   Fax:  [phone]  Physical Therapy Treatment/Progress Note  Progress Note Reporting Period 08/10/20 to 09/16/20  See note below for Objective Data and Assessment of Progress/Goals.      Patient Details  Name: Kerry [name] MRN: [id number] Date of Birth: 1941/05/08 Referring Provider (PT): Maximiano Coss, NP   Encounter Date: 09/16/2020  End of session:  Visit Number: 6 Number of Visits: 12 Date for PT re-evaluation: 10/31/20 Authorization: BCBS MCR Foto visit 6 and 10 Progress note due: 16  Start time: 9:15 End Time: 9:58 Total Time: 43 minutes  Activity tolerance: Patient tolerated treatment well Behavior during therapy: WFL for tasks assessed/performed;  Past Medical History:  Diagnosis Date  . Allergy   . Anemia    in her 20's  . Aortic insufficiency    moderate by echo 08/2019  . Arthritis    not dx'd- just per pt   . Asthma    AS A CHILD  . Cancer [facility])    endometrial cancer with hysterectomy in 1987  . Cancer ([place])    melanoma on face   . Cataract    removed both eyes   . GERD (gastroesophageal reflux disease)    occ has with diet   . History of syncope   . HTN (hypertension)   . Hyperlipidemia   . TIA (transient ischemic attack)     Past Surgical History:  Procedure Laterality Date  . ABDOMINAL HYSTERECTOMY    . ANKLE FRACTURE SURGERY    . APPENDECTOMY    . CATARACT EXTRACTION, BILATERAL    . CHOLECYSTECTOMY    . COLONOSCOPY  08/01/2007   Dr Henrene Pastor   . DENTAL SURGERY    . ovary removal    . right leg  fracture surgery       There were no vitals filed for this visit.   Subjective: Patient reports subjective overall improvement of low back pain rated as 100% and of the Rt shoulder rated as 50% reporting difficulty with reaching overhead and vacumming. She reports the pain is intermittent in the shoulder, mostly  noticing pain with repetitive use rated as 5/10. She denies any pain in the back for over a week.  Pertinent history:  LT ankle OA Limitations: Other (comment); reaching repetitive use How long can you sit comfortably: as long as I want How long can you stand comfortably: I worked 14 hours on Sunday standing How long can you walk comfortably: as long as I need to  Denies pain currently.     PT education: Education on FOTO score and overall functional progress. Education on updated HEP for Rt shoulder and review of current HEP for low back. Education on recommendation for ortho referral. Education on POC          PT assessment: FOTO 83% function back; 61% shoulder Posture comments: rounded shoulders AROM pain free lumbar AROM all WFL, pain with all shoulder AROM Shoulder flexion: 120 Shoulder abduction: 135 Shoulder IR: functional S1 Shoulder ER: functional base of occiput  Strength bilaterally LE strength grossly 4+5. Rt shoulder flexion, abduction, ER, IR 4-/5 Other special tests: (+) Empty can, (-) Neer, (-) Michel Bickers (-) Speeds (-) Drop Arm  Shoulder exercises: flexion AAROM with dowel 1 x 10; abduction AAROM with dowel 1 x 10, chest press with dowel 1 x 10, standing row green theraband 1x10  Manual therapy: Rt GHJ PROM to tolerance  PT Short Term Goals - 09/16/20 1059      PT SHORT TERM GOAL #1   Title she will be indpendent with initial HEP    Time 3    Period Weeks    Status Achieved      PT SHORT TERM GOAL #2   Title She will report 10-20% decr pain.    Baseline No low back pain; Shoulder pain at worst 5/10    Time 3    Period Weeks    Status Achieved      PT SHORT TERM GOAL #3   Title FOTO score will improve to   3 %    Time 3    Period Weeks    Status Achieved             PT Long Term Goals - 09/16/20 1101      PT LONG TERM GOAL #1   Title She will be independent with all HEP issued    Time 6    Period Weeks     Status On-going    Target Date 10/28/20      PT LONG TERM GOAL #2   Title FOTO score will improve by 6 points of more    Time 6    Period Weeks    Status Partially Met    Target Date 10/28/20      PT LONG TERM GOAL #3   Title She will report able to drive without shoulder pain    Baseline patient reports no shoulder pain with driving    Time 6    Period Weeks    Status Achieved      PT LONG TERM GOAL #4   Title She will report using RT arm at work with min to no pain    Baseline 5/10 pain reported with overhead reaching and repetitive activity    Time 6    Period Weeks    Status On-going    Target Date 10/28/20      PT LONG TERM GOAL #5   Title She will report pain in lower back eased with improved sitting posture    Time 6    Period Weeks    Status Achieved         Plan:   Patient has functionally progressed well since the start of care for her low back pain and Rt shouler pain having achieved all short term functional goals and long term goals as they relate to her low back. She is progressing well towards goals established for her Rt shoulder, though has lingering ROM and strength deficits as well as signs and symptoms indicative of potential rotator cuff pathology. She will benefit from continued care to address lingering Rt shoulder mobility deficits and weakness as well as recommend referral to ortho due to potential rotator cuff pathology. Personal factors and comorbidities: Age;Time since onset of injury/illness/exacerbation;Comorbidity 1; HX LT ankle fracture Examination activity limitations: Reach Overhead;Lift;Other; Examination participation restrictions: Occupation;Cleaning; [employer]: Good PT Frequency: 1x/week PT Duration: 6 weeks PT Treatment: Passive range of motion;Dry needling;Manual techniques;Patient/family education;Therapeutic activities;Therapeutic exercise;Electrical Stimulation;Moist Heat;Neuromuscular re-education;ADLs/Self Care Home  Management; PT next visit: Progress shoulder ROM as tolerated. progress periscapular and rotator cuff strengthening as tolerated. PT HEP: Access Code [id number] Recommended other services: ortho Consulted and agreed with plan of care: patient   Patient will benefit from skilled therapeutic intervention in order to improve the following deficits and impairments:  Pain,Decreased range of motion,Decreased activity tolerance,Decreased strength,Postural dysfunction  Visit Diagnosis:  Chronic bilateral low back pain without sciatica - Plan: PT plan of care cert/re-cert  Joint stiffness of spine - Plan: PT plan of care cert/re-cert  Abnormal posture - Plan: PT plan of care cert/re-cert  Stiffness of right shoulder, not elsewhere classified - Plan: PT plan of care cert/re-cert  Chronic right shoulder pain - Plan: PT plan of care cert/re-cert     Problem List Patient Active Problem List   Diagnosis Date Noted  . Hyponatremia 06/21/2019  . TIA (transient ischemic attack) 06/20/2019  . Aortic insufficiency 08/17/2018  . Hyperlipidemia   . Right thyroid nodule 02/07/2013  . Right carotid bruit 07/16/2012  . Ocular migraine 11/04/2011  . Hypercholesterolemia 06/01/2011  . HTN (hypertension)   . History of syncope   . Asthma   [name], PT, DPT, ATC 10/12/20 7:52 AM   [name], PT, DPT, ATC 10/12/20 7:53 AM   [name], PT, DPT, ATC 10/19/20 9:28 AM  [facility] [address] [place], [place], [address] Phone: [phone]   Fax:  [phone]  Name: AMIL MOSEMAN MRN: [id number] Date of Birth: [birth year]

## 2020-09-23 ENCOUNTER — Ambulatory Visit: Payer: Medicare Other | Admitting: Physical Therapy

## 2020-09-23 ENCOUNTER — Other Ambulatory Visit (HOSPITAL_COMMUNITY): Payer: Medicare Other

## 2020-09-23 ENCOUNTER — Other Ambulatory Visit: Payer: Self-pay

## 2020-09-23 ENCOUNTER — Encounter: Payer: Self-pay | Admitting: Physical Therapy

## 2020-09-23 DIAGNOSIS — M25511 Pain in right shoulder: Secondary | ICD-10-CM

## 2020-09-23 DIAGNOSIS — M2560 Stiffness of unspecified joint, not elsewhere classified: Secondary | ICD-10-CM | POA: Diagnosis not present

## 2020-09-23 DIAGNOSIS — M545 Low back pain, unspecified: Secondary | ICD-10-CM | POA: Diagnosis not present

## 2020-09-23 DIAGNOSIS — R293 Abnormal posture: Secondary | ICD-10-CM

## 2020-09-23 DIAGNOSIS — M25611 Stiffness of right shoulder, not elsewhere classified: Secondary | ICD-10-CM

## 2020-09-23 DIAGNOSIS — G8929 Other chronic pain: Secondary | ICD-10-CM | POA: Diagnosis not present

## 2020-09-23 NOTE — Therapy (Signed)
[place], [place], [address] Phone: [phone]   Fax:  [phone]  Physical Therapy Treatment  Patient Details  Name: Kerry [name] MRN: [id number] Date of Birth: 19-Jan-1941 Referring Provider (PT): Maximiano Coss, NP   Encounter Date: 09/23/2020   PT End of Session - 09/23/20 1107    Visit Number 7    Number of Visits 12    Date for PT Re-Evaluation 10/31/20    Authorization Type BCBS MCR FOTO at visit 6 and 10; Progress note visit 10    PT Start Time 1100    PT Stop Time 1145    PT Time Calculation (min) 45 min           Past Medical History:  Diagnosis Date  . Allergy   . Anemia    in her 20's  . Aortic insufficiency    moderate by echo 08/2019  . Arthritis    not dx'd- just per pt   . Asthma    AS A CHILD  . Cancer [facility])    endometrial cancer with hysterectomy in 1987  . Cancer ([place])    melanoma on face   . Cataract    removed both eyes   . GERD (gastroesophageal reflux disease)    occ has with diet   . History of syncope   . HTN (hypertension)   . Hyperlipidemia   . TIA (transient ischemic attack)     Past Surgical History:  Procedure Laterality Date  . ABDOMINAL HYSTERECTOMY    . ANKLE FRACTURE SURGERY    . APPENDECTOMY    . CATARACT EXTRACTION, BILATERAL    . CHOLECYSTECTOMY    . COLONOSCOPY  08/01/2007   Dr Henrene Pastor   . DENTAL SURGERY    . ovary removal    . right leg  fracture surgery       There were no vitals filed for this visit.   Subjective Assessment - 09/23/20 1106    Subjective Pt reports shoulder is sore.    Currently in Pain? Yes    Pain Score 3     Pain Location Shoulder    Pain Orientation Right;Proximal    Pain Descriptors / Indicators Sore   twinge   Pain Type Chronic pain    Aggravating Factors  constant    Pain Relieving Factors change positions    Pain Score 0    Pain Location Back                             [facility]  Adult PT Treatment/Exercise - 09/23/20 0001      Lumbar Exercises: Supine   Bridge 10 reps    Bridge with clamshell 10 reps    Bridge with [employer] Limitations green    Other Supine Lumbar Exercises green band clam 10 x 2      Shoulder Exercises: Supine   Horizontal ABduction 20 reps    Theraband Level (Shoulder Horizontal ABduction) Level 1 (Yellow)    Flexion Limitations AAROM with dowel 2 x 10    ABduction Limitations AAROM with dowel 1 x 10    Other Supine Exercises chest press with dowel 2 x 10      Shoulder Exercises: Seated   Flexion 10 reps      Shoulder Exercises: Sidelying   External Rotation Weight (lbs) 1    External Rotation Limitations 2 x 10 Rt AROM  ABduction 10 reps    ABduction Limitations x2      Shoulder Exercises: Standing   External Rotation 10 reps    Theraband Level (Shoulder External Rotation) Level 2 (Red)    Internal Rotation 10 reps    Theraband Level (Shoulder Internal Rotation) Level 2 (Red)    Extension 20 reps    Theraband Level (Shoulder Extension) Level 3 (Green)    Row 20 reps    Theraband Level (Shoulder Row) Level 3 (Green)      Manual Therapy   Manual therapy comments Rt GHJ PROM to tolerance                    PT Short Term Goals - 09/16/20 1059      PT SHORT TERM GOAL #1   Title she will be indpendent with initial HEP    Time 3    Period Weeks    Status Achieved      PT SHORT TERM GOAL #2   Title She will report 10-20% decr pain.    Baseline No low back pain; Shoulder pain at worst 5/10    Time 3    Period Weeks    Status Achieved      PT SHORT TERM GOAL #3   Title FOTO score will improve to   3 %    Time 3    Period Weeks    Status Achieved             PT Long Term Goals - 09/16/20 1101      PT LONG TERM GOAL #1   Title She will be independent with all HEP issued    Time 6    Period Weeks    Status On-going    Target Date 10/28/20      PT LONG TERM GOAL #2   Title FOTO score will  improve by 6 points of more    Time 6    Period Weeks    Status Partially Met    Target Date 10/28/20      PT LONG TERM GOAL #3   Title She will report able to drive without shoulder pain    Baseline patient reports no shoulder pain with driving    Time 6    Period Weeks    Status Achieved      PT LONG TERM GOAL #4   Title She will report using RT arm at work with min to no pain    Baseline 5/10 pain reported with overhead reaching and repetitive activity    Time 6    Period Weeks    Status On-going    Target Date 10/28/20      PT LONG TERM GOAL #5   Title She will report pain in lower back eased with improved sitting posture    Time 6    Period Weeks    Status Achieved                 Plan - 09/23/20 1107    Clinical Impression Statement Pt reports she went shopping all day yesterday without increased back pain that is a significant improvement. Her shoulder is still with intermittent pain especially with overhead reaching/lifting. Continued with strength and ROM focus on right shoulder with review of core strength. She is progressing toward shoulder goals.    PT Next Visit Plan Progress shoulder ROM as tolerated. progress periscapular and rotator cuff strengthening as tolerated.    PT Home Exercise  Plan Access Code [email or web address]           Patient will benefit from skilled therapeutic intervention in order to improve the following deficits and impairments:  Pain,Decreased range of motion,Decreased activity tolerance,Decreased strength,Postural dysfunction  Visit Diagnosis: Chronic bilateral low back pain without sciatica  Joint stiffness of spine  Abnormal posture  Chronic right shoulder pain  Stiffness of right shoulder, not elsewhere classified     Problem List Patient Active Problem List   Diagnosis Date Noted  . Hyponatremia 06/21/2019  . TIA (transient ischemic attack) 06/20/2019  . Aortic insufficiency 08/17/2018  . Hyperlipidemia   . Right  thyroid nodule 02/07/2013  . Right carotid bruit 07/16/2012  . Ocular migraine 11/04/2011  . Hypercholesterolemia 06/01/2011  . HTN (hypertension)   . History of syncope   . Asthma     Kerry [name], [place] 09/23/2020, 11:54 AM  [facility] [address] [place], [place], [address] Phone: [phone]   Fax:  [phone]  Name: Kerry [name] MRN: [id number] Date of Birth: [birth year]

## 2020-09-29 ENCOUNTER — Other Ambulatory Visit (HOSPITAL_COMMUNITY): Payer: Medicare Other

## 2020-10-02 ENCOUNTER — Ambulatory Visit: Payer: Medicare Other | Admitting: Physical Therapy

## 2020-10-05 ENCOUNTER — Other Ambulatory Visit: Payer: Self-pay

## 2020-10-05 ENCOUNTER — Ambulatory Visit: Payer: Medicare Other

## 2020-10-05 DIAGNOSIS — M545 Low back pain, unspecified: Secondary | ICD-10-CM | POA: Diagnosis not present

## 2020-10-05 DIAGNOSIS — R293 Abnormal posture: Secondary | ICD-10-CM | POA: Diagnosis not present

## 2020-10-05 DIAGNOSIS — M2560 Stiffness of unspecified joint, not elsewhere classified: Secondary | ICD-10-CM

## 2020-10-05 DIAGNOSIS — M25511 Pain in right shoulder: Secondary | ICD-10-CM | POA: Diagnosis not present

## 2020-10-05 DIAGNOSIS — G8929 Other chronic pain: Secondary | ICD-10-CM | POA: Diagnosis not present

## 2020-10-05 DIAGNOSIS — M25611 Stiffness of right shoulder, not elsewhere classified: Secondary | ICD-10-CM

## 2020-10-05 NOTE — Therapy (Signed)
[place], [place], [address] Phone: [phone]   Fax:  [phone]  Physical Therapy Treatment  Patient Details  Name: ADILYNNE FITZWATER MRN: [id number] Date of Birth: Oct 16, 1940 Referring Provider (PT): Maximiano Coss, NP   Encounter Date: 10/05/2020   PT End of Session - 10/05/20 0714    Visit Number 8    Number of Visits 12    Date for PT Re-Evaluation 10/31/20    Authorization Type BCBS MCR FOTO at visit 6 and 10; Progress note visit 10    PT Start Time 0715    PT Stop Time 0759    PT Time Calculation (min) 44 min    Activity Tolerance Patient tolerated treatment well    Behavior During Therapy [facility] for tasks assessed/performed           Past Medical History:  Diagnosis Date  . Allergy   . Anemia    in her 20's  . Aortic insufficiency    moderate by echo 08/2019  . Arthritis    not dx'd- just per pt   . Asthma    AS A CHILD  . Cancer [facility])    endometrial cancer with hysterectomy in 1987  . Cancer ([place])    melanoma on face   . Cataract    removed both eyes   . GERD (gastroesophageal reflux disease)    occ has with diet   . History of syncope   . HTN (hypertension)   . Hyperlipidemia   . TIA (transient ischemic attack)     Past Surgical History:  Procedure Laterality Date  . ABDOMINAL HYSTERECTOMY    . ANKLE FRACTURE SURGERY    . APPENDECTOMY    . CATARACT EXTRACTION, BILATERAL    . CHOLECYSTECTOMY    . COLONOSCOPY  08/01/2007   Dr Henrene Pastor   . DENTAL SURGERY    . ovary removal    . right leg  fracture surgery       There were no vitals filed for this visit.   Subjective Assessment - 10/05/20 0718    Subjective "My shoulder felt pretty good this past week, but it's a little sore today." Patient reports "no problems" with her back.    Pertinent History LT ankle OA    Currently in Pain? Yes    Pain Score 1     Pain Location Shoulder    Pain Orientation Right;Proximal     Pain Descriptors / Indicators Sore    Pain Type Chronic pain    Pain Onset More than a month ago    Pain Frequency Intermittent    Multiple Pain Sites No              [facility] PT Assessment - 10/05/20 0001      AROM   Right Shoulder Flexion 140 Degrees    Right Shoulder ABduction 142 Degrees                         [facility] Adult PT Treatment/Exercise - 10/05/20 0001      Shoulder Exercises: Supine   Flexion Limitations AAROM with dowel 1 x 10    Other Supine Exercises chest press with dowel 2 x 10 with 2# cuff    Other Supine Exercises serratus punch 2 x 10; 3#      Shoulder Exercises: Sidelying   External Rotation Limitations 2 x 10; 1#      Shoulder Exercises: Standing  Internal Rotation Limitations 2 x 10; green TB    Extension Limitations 2 x 10 green TB    Other Standing Exercises horizontal shoulder adduction 2 x 10; yellow TB      Shoulder Exercises: ROM/Strengthening   UBE (Upper Arm Bike) 2 min each fwd/bwd level 1      Manual Therapy   Manual therapy comments Rt GHJ PROM to tolerance, Grade II-III GH joint mobs inferior A/P                    PT Short Term Goals - 09/16/20 1059      PT SHORT TERM GOAL #1   Title she will be indpendent with initial HEP    Time 3    Period Weeks    Status Achieved      PT SHORT TERM GOAL #2   Title She will report 10-20% decr pain.    Baseline No low back pain; Shoulder pain at worst 5/10    Time 3    Period Weeks    Status Achieved      PT SHORT TERM GOAL #3   Title FOTO score will improve to   3 %    Time 3    Period Weeks    Status Achieved             PT Long Term Goals - 09/16/20 1101      PT LONG TERM GOAL #1   Title She will be independent with all HEP issued    Time 6    Period Weeks    Status On-going    Target Date 10/28/20      PT LONG TERM GOAL #2   Title FOTO score will improve by 6 points of more    Time 6    Period Weeks    Status Partially Met    Target Date  10/28/20      PT LONG TERM GOAL #3   Title She will report able to drive without shoulder pain    Baseline patient reports no shoulder pain with driving    Time 6    Period Weeks    Status Achieved      PT LONG TERM GOAL #4   Title She will report using RT arm at work with min to no pain    Baseline 5/10 pain reported with overhead reaching and repetitive activity    Time 6    Period Weeks    Status On-going    Target Date 10/28/20      PT LONG TERM GOAL #5   Title She will report pain in lower back eased with improved sitting posture    Time 6    Period Weeks    Status Achieved                 Plan - 10/05/20 0721    Clinical Impression Statement Overall good tolerance to today's session with patient reporting minor increased soreness during chest press and isolated ER strengthening, though denies any pain. Patient required moderate postural cues throughout session to decrease excessive upper trap engagement and forward shoulders with intermittent ability to correct. Her shoulder flexion and abduction AROM have improved having nearly achieved functional overhead ROM.    PT Next Visit Plan Progress shoulder ROM as tolerated. progress periscapular and rotator cuff strengthening as tolerated. update HEP    PT Home Exercise Plan Access Code [phone]  Patient will benefit from skilled therapeutic intervention in order to improve the following deficits and impairments:  Pain,Decreased range of motion,Decreased activity tolerance,Decreased strength,Postural dysfunction  Visit Diagnosis: Chronic bilateral low back pain without sciatica  Joint stiffness of spine  Abnormal posture  Chronic right shoulder pain  Stiffness of right shoulder, not elsewhere classified     Problem List Patient Active Problem List   Diagnosis Date Noted  . Hyponatremia 06/21/2019  . TIA (transient ischemic attack) 06/20/2019  . Aortic insufficiency 08/17/2018  . Hyperlipidemia    . Right thyroid nodule 02/07/2013  . Right carotid bruit 07/16/2012  . Ocular migraine 11/04/2011  . Hypercholesterolemia 06/01/2011  . HTN (hypertension)   . History of syncope   . Asthma    [name], PT, DPT, ATC 10/05/20 8:01 AM  [facility] [address] [place], [place], [address] Phone: [phone]   Fax:  [phone]  Name: SHIELA BRUNS MRN: [id number] Date of Birth: [birth year]

## 2020-10-07 ENCOUNTER — Ambulatory Visit: Payer: Medicare Other

## 2020-10-12 ENCOUNTER — Other Ambulatory Visit: Payer: Self-pay

## 2020-10-12 ENCOUNTER — Ambulatory Visit: Payer: Medicare Other

## 2020-10-12 DIAGNOSIS — M25611 Stiffness of right shoulder, not elsewhere classified: Secondary | ICD-10-CM | POA: Diagnosis not present

## 2020-10-12 DIAGNOSIS — R293 Abnormal posture: Secondary | ICD-10-CM

## 2020-10-12 DIAGNOSIS — G8929 Other chronic pain: Secondary | ICD-10-CM

## 2020-10-12 DIAGNOSIS — M2560 Stiffness of unspecified joint, not elsewhere classified: Secondary | ICD-10-CM

## 2020-10-12 DIAGNOSIS — M545 Low back pain, unspecified: Secondary | ICD-10-CM | POA: Diagnosis not present

## 2020-10-12 DIAGNOSIS — M25511 Pain in right shoulder: Secondary | ICD-10-CM | POA: Diagnosis not present

## 2020-10-12 NOTE — Therapy (Signed)
[place], [place], [address] Phone: [phone]   Fax:  [phone]  Physical Therapy Treatment  Patient Details  Name: Kerry [name] MRN: [id number] Date of Birth: 12/24/1940 Referring Provider (PT): Maximiano Coss, NP   Encounter Date: 10/12/2020   PT End of Session - 10/12/20 0735    Visit Number 9    Number of Visits 12    Date for PT Re-Evaluation 10/31/20    Authorization Type BCBS MCR FOTO at visit 6 and 10    Progress Note Due on Visit 16    PT Start Time 0715    PT Stop Time 0757    PT Time Calculation (min) 42 min    Activity Tolerance Patient tolerated treatment well    Behavior During Therapy [name] for tasks assessed/performed           Past Medical History:  Diagnosis Date  . Allergy   . Anemia    in her 20's  . Aortic insufficiency    moderate by echo 08/2019  . Arthritis    not dx'd- just per pt   . Asthma    AS A CHILD  . Cancer [facility])    endometrial cancer with hysterectomy in 1987  . Cancer ([place])    melanoma on face   . Cataract    removed both eyes   . GERD (gastroesophageal reflux disease)    occ has with diet   . History of syncope   . HTN (hypertension)   . Hyperlipidemia   . TIA (transient ischemic attack)     Past Surgical History:  Procedure Laterality Date  . ABDOMINAL HYSTERECTOMY    . ANKLE FRACTURE SURGERY    . APPENDECTOMY    . CATARACT EXTRACTION, BILATERAL    . CHOLECYSTECTOMY    . COLONOSCOPY  08/01/2007   Dr Henrene Pastor   . DENTAL SURGERY    . ovary removal    . right leg  fracture surgery       There were no vitals filed for this visit.   Subjective Assessment - 10/12/20 0720    Subjective "I did some reaching over the weekend and my shoulder is tender, but not painful."    Currently in Pain? Yes    Pain Score 1     Pain Location Shoulder    Pain Orientation Right;Proximal    Pain Descriptors / Indicators Tender    Pain Type Chronic pain     Pain Onset More than a month ago    Pain Frequency Intermittent    Multiple Pain Sites No              [facility] PT Assessment - 10/12/20 0001      AROM   Right Shoulder Flexion 140 Degrees   155 on finger ladder   Right Shoulder ABduction 145 Degrees                         [facility] Adult PT Treatment/Exercise - 10/12/20 0001      Shoulder Exercises: Supine   Flexion Limitations AAROM with dowel 1 x 10      Shoulder Exercises: Sidelying   External Rotation Limitations 2 x 10; 1#    ABduction 10 reps    ABduction Limitations x 2      Shoulder Exercises: Standing   Extension Limitations 2 x 10 green TB    Row Limitations 2  x 10 blue TB  Shoulder Exercises: ROM/Strengthening   UBE (Upper Arm Bike) 2 min each fwd/bwd level 1    Other ROM/Strengthening Exercises finger ladder 2 x 5 flexion      Manual Therapy   Manual therapy comments Rt GHJ PROM to tolerance, Grade II-III GH joint mobs inferior A/P, STM deltoid and bicep brachii                    PT Short Term Goals - 09/16/20 1059      PT SHORT TERM GOAL #1   Title she will be indpendent with initial HEP    Time 3    Period Weeks    Status Achieved      PT SHORT TERM GOAL #2   Title She will report 10-20% decr pain.    Baseline No low back pain; Shoulder pain at worst 5/10    Time 3    Period Weeks    Status Achieved      PT SHORT TERM GOAL #3   Title FOTO score will improve to   3 %    Time 3    Period Weeks    Status Achieved             PT Long Term Goals - 09/16/20 1101      PT LONG TERM GOAL #1   Title She will be independent with all HEP issued    Time 6    Period Weeks    Status On-going    Target Date 10/28/20      PT LONG TERM GOAL #2   Title FOTO score will improve by 6 points of more    Time 6    Period Weeks    Status Partially Met    Target Date 10/28/20      PT LONG TERM GOAL #3   Title She will report able to drive without shoulder pain     Baseline patient reports no shoulder pain with driving    Time 6    Period Weeks    Status Achieved      PT LONG TERM GOAL #4   Title She will report using RT arm at work with min to no pain    Baseline 5/10 pain reported with overhead reaching and repetitive activity    Time 6    Period Weeks    Status On-going    Target Date 10/28/20      PT LONG TERM GOAL #5   Title She will report pain in lower back eased with improved sitting posture    Time 6    Period Weeks    Status Achieved                 Plan - 10/12/20 0740    Clinical Impression Statement Patient tolerated session well today with light progression of shoulder and periscapular strengthening and AAROM activity. She reports pain during concentric phase of shoulder flexion and abduction AROM, though no pain when she reaches end range or during eccentric phase of motion. Improved tolerance to AAROM compared to AROM with patient able to achieve further range without reports of pain. Patient required minimal postural cues to decrease shoulder shrug with ability to correct once cued.    PT Next Visit Plan Progress shoulder ROM as tolerated. progress periscapular and rotator cuff strengthening as tolerated. update HEP    PT Home Exercise Plan Access Code [phone]           Patient will  benefit from skilled therapeutic intervention in order to improve the following deficits and impairments:  Pain,Decreased range of motion,Decreased activity tolerance,Decreased strength,Postural dysfunction  Visit Diagnosis: Chronic bilateral low back pain without sciatica  Joint stiffness of spine  Abnormal posture  Chronic right shoulder pain  Stiffness of right shoulder, not elsewhere classified     Problem List Patient Active Problem List   Diagnosis Date Noted  . Hyponatremia 06/21/2019  . TIA (transient ischemic attack) 06/20/2019  . Aortic insufficiency 08/17/2018  . Hyperlipidemia   . Right thyroid nodule  02/07/2013  . Right carotid bruit 07/16/2012  . Ocular migraine 11/04/2011  . Hypercholesterolemia 06/01/2011  . HTN (hypertension)   . History of syncope   . Asthma    [name], PT, DPT, ATC 10/12/20 8:01 AM  [facility] [address] [place], [place], [address] Phone: [phone]   Fax:  [phone]  Name: Kerry [name] MRN: [id number] Date of Birth: Apr 05, 1941

## 2020-10-14 ENCOUNTER — Ambulatory Visit: Payer: Medicare Other | Admitting: Physical Therapy

## 2020-10-15 ENCOUNTER — Other Ambulatory Visit: Payer: Self-pay | Admitting: Cardiology

## 2020-10-19 ENCOUNTER — Other Ambulatory Visit: Payer: Self-pay

## 2020-10-19 ENCOUNTER — Encounter: Payer: Self-pay | Admitting: Physical Therapy

## 2020-10-19 ENCOUNTER — Ambulatory Visit: Payer: Medicare Other | Attending: Registered Nurse | Admitting: Physical Therapy

## 2020-10-19 DIAGNOSIS — R293 Abnormal posture: Secondary | ICD-10-CM | POA: Diagnosis not present

## 2020-10-19 DIAGNOSIS — G8929 Other chronic pain: Secondary | ICD-10-CM | POA: Insufficient documentation

## 2020-10-19 DIAGNOSIS — M545 Low back pain, unspecified: Secondary | ICD-10-CM | POA: Insufficient documentation

## 2020-10-19 DIAGNOSIS — M25611 Stiffness of right shoulder, not elsewhere classified: Secondary | ICD-10-CM | POA: Diagnosis not present

## 2020-10-19 DIAGNOSIS — M25511 Pain in right shoulder: Secondary | ICD-10-CM | POA: Diagnosis not present

## 2020-10-19 DIAGNOSIS — M2560 Stiffness of unspecified joint, not elsewhere classified: Secondary | ICD-10-CM | POA: Diagnosis not present

## 2020-10-19 NOTE — Therapy (Signed)
[place], [place], [address] Phone: [phone]   Fax:  [phone]  Physical Therapy Treatment  Patient Details  Name: Kerry [name] MRN: [id number] Date of Birth: [birth year] Referring Provider (PT): Maximiano Coss, NP   Encounter Date: 10/19/2020   PT End of Session - 10/19/20 0805    Visit Number 10    Number of Visits 12    Date for PT Re-Evaluation 10/31/20    Authorization Type BCBS MCR FOTO at visit 6 and 10    PT Start Time 0803    PT Stop Time 0844    PT Time Calculation (min) 41 min           Past Medical History:  Diagnosis Date  . Allergy   . Anemia    in her 20's  . Aortic insufficiency    moderate by echo 08/2019  . Arthritis    not dx'd- just per pt   . Asthma    AS A CHILD  . Cancer [facility])    endometrial cancer with hysterectomy in 1987  . Cancer ([place])    melanoma on face   . Cataract    removed both eyes   . GERD (gastroesophageal reflux disease)    occ has with diet   . History of syncope   . HTN (hypertension)   . Hyperlipidemia   . TIA (transient ischemic attack)     Past Surgical History:  Procedure Laterality Date  . ABDOMINAL HYSTERECTOMY    . ANKLE FRACTURE SURGERY    . APPENDECTOMY    . CATARACT EXTRACTION, BILATERAL    . CHOLECYSTECTOMY    . COLONOSCOPY  08/01/2007   Dr Henrene Pastor   . DENTAL SURGERY    . ovary removal    . right leg  fracture surgery       There were no vitals filed for this visit.   Subjective Assessment - 10/19/20 0808    Subjective My shoulder is hurting a little bit, 2/10.    Currently in Pain? Yes    Pain Score 2     Pain Location Arm    Pain Orientation Right;Upper;Lateral    Pain Descriptors / Indicators Tender    Pain Type Chronic pain    Aggravating Factors  constant    Pain Relieving Factors change positions    Pain Score 0    Pain Location Back              [facility] PT Assessment - 10/19/20 0001       Observation/Other Assessments   Focus on Therapeutic Outcomes (FOTO)  83% back, 63% shoulder      AROM   Right Shoulder Flexion 140 Degrees    Right Shoulder Internal Rotation --   reach to L2   Right Shoulder External Rotation --   reaches back of neck                        [facility] Adult PT Treatment/Exercise - 10/19/20 0001      Shoulder Exercises: Sidelying   External Rotation Limitations 2 x 10; 1#   then 2#  x 10     Shoulder Exercises: Standing   External Rotation 10 reps   2 sets   Theraband Level (Shoulder External Rotation) Level 3 (Green)    Internal Rotation Limitations 2 x 10; green TB    Extension 20 reps    Theraband Level (Shoulder Extension) Level  3 (Green)    Extension Limitations 2 x 10 green TB    Row Limitations 2  x 10 green TB    Other Standing Exercises horizontal shoulder adduction 2 x 10; yellow TB      Shoulder Exercises: ROM/Strengthening   UBE (Upper Arm Bike) 2 min each fwd/bwd level 2    Other ROM/Strengthening Exercises finger ladder 2 x 5 flexion      Manual Therapy   Manual therapy comments Right shoulder passive flexion, ER, IR                    PT Short Term Goals - 09/16/20 1059      PT SHORT TERM GOAL #1   Title she will be indpendent with initial HEP    Time 3    Period Weeks    Status Achieved      PT SHORT TERM GOAL #2   Title She will report 10-20% decr pain.    Baseline No low back pain; Shoulder pain at worst 5/10    Time 3    Period Weeks    Status Achieved      PT SHORT TERM GOAL #3   Title FOTO score will improve to   3 %    Time 3    Period Weeks    Status Achieved             PT Long Term Goals - 10/19/20 6386      PT LONG TERM GOAL #1   Title She will be independent with all HEP issued    Period Weeks    Status On-going      PT LONG TERM GOAL #2   Title FOTO score will improve by 6 points of more    Time 6    Period Weeks    Status Achieved      PT LONG TERM GOAL #3    Title She will report able to drive without shoulder pain    Time 6    Period Weeks    Status Achieved      PT LONG TERM GOAL #4   Title She will report using RT arm at work with min to no pain    Baseline 1/10 pain with hanging clothes    Time 6    Period Weeks    Status Achieved      PT LONG TERM GOAL #5   Title She will report pain in lower back eased with improved sitting posture    Time 6    Period Weeks    Status Achieved                 Plan - 10/19/20 0831    Clinical Impression Statement Pt reports her shoulder is doing well at work except with hanging items up over shoulder height. OVerall the pain intensity is much improved and  increases to 1/10 at most. LTG#4 met.  She is compliant with her HEP. Her ROM for shoulder IR and ER reaching has improved. Her FOTO score has improved for both shoulder and lumbar.    PT Next Visit Plan Likely ready for DC to HEP, review HEP and goals    PT Home Exercise Plan Access Code [phone]           Patient will benefit from skilled therapeutic intervention in order to improve the following deficits and impairments:  Pain,Decreased range of motion,Decreased activity tolerance,Decreased strength,Postural dysfunction  Visit Diagnosis: Chronic bilateral  low back pain without sciatica  Joint stiffness of spine  Abnormal posture  Chronic right shoulder pain  Stiffness of right shoulder, not elsewhere classified     Problem List Patient Active Problem List   Diagnosis Date Noted  . Hyponatremia 06/21/2019  . TIA (transient ischemic attack) 06/20/2019  . Aortic insufficiency 08/17/2018  . Hyperlipidemia   . Right thyroid nodule 02/07/2013  . Right carotid bruit 07/16/2012  . Ocular migraine 11/04/2011  . Hypercholesterolemia 06/01/2011  . HTN (hypertension)   . History of syncope   . Asthma     [name], [place] 10/19/2020, 8:50 AM  [facility] [address] [place], [place], [address] Phone: [phone]   Fax:  [phone]  Name: Kerry [name] MRN: [id number] Date of Birth: [birth year]

## 2020-10-20 ENCOUNTER — Ambulatory Visit (HOSPITAL_COMMUNITY): Payer: Medicare Other | Attending: Internal Medicine

## 2020-10-20 DIAGNOSIS — I351 Nonrheumatic aortic (valve) insufficiency: Secondary | ICD-10-CM | POA: Insufficient documentation

## 2020-10-20 LAB — ECHOCARDIOGRAM COMPLETE
Area-P 1/2: 2.32 cm2
P 1/2 time: 445 msec
S' Lateral: 3.8 cm

## 2020-10-21 ENCOUNTER — Encounter: Payer: Self-pay | Admitting: Cardiology

## 2020-10-21 ENCOUNTER — Encounter: Payer: Medicare Other | Admitting: Physical Therapy

## 2020-10-21 DIAGNOSIS — I420 Dilated cardiomyopathy: Secondary | ICD-10-CM | POA: Insufficient documentation

## 2020-10-26 ENCOUNTER — Encounter: Payer: Medicare Other | Admitting: Physical Therapy

## 2020-10-26 ENCOUNTER — Ambulatory Visit: Payer: Medicare Other

## 2020-10-26 ENCOUNTER — Other Ambulatory Visit: Payer: Self-pay

## 2020-10-26 DIAGNOSIS — M545 Low back pain, unspecified: Secondary | ICD-10-CM | POA: Diagnosis not present

## 2020-10-26 DIAGNOSIS — M25511 Pain in right shoulder: Secondary | ICD-10-CM | POA: Diagnosis not present

## 2020-10-26 DIAGNOSIS — R293 Abnormal posture: Secondary | ICD-10-CM

## 2020-10-26 DIAGNOSIS — M2560 Stiffness of unspecified joint, not elsewhere classified: Secondary | ICD-10-CM

## 2020-10-26 DIAGNOSIS — M25611 Stiffness of right shoulder, not elsewhere classified: Secondary | ICD-10-CM

## 2020-10-26 DIAGNOSIS — G8929 Other chronic pain: Secondary | ICD-10-CM

## 2020-10-26 NOTE — Therapy (Signed)
[place], [place], [address] Phone: [phone]   Fax:  [phone]  Physical Therapy Treatment/Discharge  Patient Details  Name: Kerry [name] MRN: [id number] Date of Birth: February 12, 1941 Referring Provider (PT): Maximiano Coss, NP   Encounter Date: 10/26/2020   PT End of Session - 10/26/20 1330    Visit Number 11    Number of Visits 12    Date for PT Re-Evaluation 10/31/20    Authorization Type BCBS MCR FOTO at visit 6 and 10    PT Start Time 1330    PT Stop Time 1409    PT Time Calculation (min) 39 min    Activity Tolerance Patient tolerated treatment well    Behavior During Therapy [facility] for tasks assessed/performed           Past Medical History:  Diagnosis Date  . Allergy   . Anemia    in her 20's  . Aortic insufficiency    mild by echo 10/2020  . Arthritis    not dx'd- just per pt   . Asthma    AS A CHILD  . Cancer [facility])    endometrial cancer with hysterectomy in 1987  . Cancer ([place])    melanoma on face   . Cataract    removed both eyes   . DCM (dilated cardiomyopathy) ([place])    EF 50-55%  . GERD (gastroesophageal reflux disease)    occ has with diet   . History of syncope   . HTN (hypertension)   . Hyperlipidemia   . TIA (transient ischemic attack)     Past Surgical History:  Procedure Laterality Date  . ABDOMINAL HYSTERECTOMY    . ANKLE FRACTURE SURGERY    . APPENDECTOMY    . CATARACT EXTRACTION, BILATERAL    . CHOLECYSTECTOMY    . COLONOSCOPY  08/01/2007   Dr Henrene Pastor   . DENTAL SURGERY    . ovary removal    . right leg  fracture surgery       There were no vitals filed for this visit.   Subjective Assessment - 10/26/20 1333    Subjective Patient reports she is doing well today. She denies any pain in the shoulder currently and reports she gets occasional twinge in the back mostly noted when she is bending down. She reports compliance with HEP and feels that she is  ready to transition to home program at this time.    Currently in Pain? No/denies              [facility] PT Assessment - 10/26/20 0001      Observation/Other Assessments   Focus on Therapeutic Outcomes (FOTO)  lumbar 94%, shoulder 100%      AROM   Overall AROM Comments no pain with shoulder and lumbar AROM    Right Shoulder Flexion 150 Degrees    Right Shoulder ABduction 160 Degrees    Right Shoulder Internal Rotation 85 Degrees    Right Shoulder External Rotation 50 Degrees    Lumbar Flexion WNL    Lumbar Extension WNL    Lumbar - Right Side Bend WNL    Lumbar - Left Side Bend WNL    Lumbar - Right Rotation WNL    Lumbar - Left Rotation WNL      Strength   Overall Strength Comments bilateral LE strength gross 4+/5; bilateral UE strength gross 4+/5 with exception of shoulder flexion 4/5 on the Rt.  [place] Adult PT Treatment/Exercise - 10/26/20 0001      Self-Care   Other Self-Care Comments  see patient education      Lumbar Exercises: Supine   Pelvic Tilt Limitations 2 x 10    Bridge Limitations 2 x 10      Shoulder Exercises: Supine   Flexion Limitations AAROM with dowel 1 x 10    ABduction Limitations AAROM with dowel 1  x10    Other Supine Exercises chest press with dowel 1 x 10      Shoulder Exercises: Standing   External Rotation 10 reps    Theraband Level (Shoulder External Rotation) Level 1 (Yellow)    External Rotation Limitations x2; bilateral    Row Limitations 2 x 15 blue band      Shoulder Exercises: ROM/Strengthening   UBE (Upper Arm Bike) 2 min each fwd/bwd level 2                  PT Education - 10/26/20 1353    Education Details Discharge education and education on overall functional progress. Updated HEP.    Person(s) Educated Patient    Methods Explanation;Handout;Demonstration    Comprehension Verbalized understanding;Returned demonstration            PT Short Term Goals - 10/26/20 1329       PT SHORT TERM GOAL #1   Title she will be indpendent with initial HEP    Time 3    Period Weeks    Status Achieved      PT SHORT TERM GOAL #2   Title She will report 10-20% decr pain.    Baseline No low back pain; Shoulder pain at worst 1/10    Time 3    Period Weeks    Status Achieved      PT SHORT TERM GOAL #3   Title FOTO score will improve to   3 %    Time 3    Period Weeks    Status Achieved             PT Long Term Goals - 10/26/20 1329      PT LONG TERM GOAL #1   Title She will be independent with all HEP issued    Period Weeks    Status Achieved      PT LONG TERM GOAL #2   Title FOTO score will improve by 6 points of more    Time 6    Period Weeks    Status Achieved      PT LONG TERM GOAL #3   Title She will report able to drive without shoulder pain    Time 6    Period Weeks    Status Achieved      PT LONG TERM GOAL #4   Title She will report using RT arm at work with min to no pain    Baseline 1/10 pain at worst    Time 6    Period Weeks    Status Achieved      PT LONG TERM GOAL #5   Title She will report pain in lower back eased with improved sitting posture    Time 6    Period Weeks    Status Achieved                 Plan - 10/26/20 1356    Clinical Impression Statement Patient has progressed well throughout duration of care having met all established short term and long term  functional goals as they relate to her back and shoulder. She reports a significant reduction in her pain compared to baseline with shoulder pain at worst 1/10 and occasional twinge in her low back with bending activity. She is therefore appropriate for D/C at this time to advanced home program.    PT Next Visit Plan --    PT Home Exercise Plan Access Code [phone]    Consulted and Agree with Plan of Care Patient           Patient will benefit from skilled therapeutic intervention in order to improve the following deficits and impairments:  Pain,Decreased  range of motion,Decreased activity tolerance,Decreased strength,Postural dysfunction  Visit Diagnosis: Chronic bilateral low back pain without sciatica  Joint stiffness of spine  Abnormal posture  Chronic right shoulder pain  Stiffness of right shoulder, not elsewhere classified     Problem List Patient Active Problem List   Diagnosis Date Noted  . DCM (dilated cardiomyopathy) ([place])   . Hyponatremia 06/21/2019  . TIA (transient ischemic attack) 06/20/2019  . Aortic insufficiency 08/17/2018  . Hyperlipidemia   . Right thyroid nodule 02/07/2013  . Right carotid bruit 07/16/2012  . Ocular migraine 11/04/2011  . Hypercholesterolemia 06/01/2011  . HTN (hypertension)   . History of syncope   . Asthma    PHYSICAL THERAPY DISCHARGE SUMMARY  Visits from Start of Care: 11  Current functional level related to goals / functional outcomes: See above   Remaining deficits: See above   Education / Equipment: See above   Plan: Patient agrees to discharge.  Patient goals were met. Patient is being discharged due to meeting the stated rehab goals.  ?????          [name], PT, DPT, ATC 10/26/20 2:11 PM  [facility] [address] [place], [place], [address] Phone: [phone]   Fax:  [phone]  Name: Kerry [name] MRN: [id number] Date of Birth: 17-Apr-1941

## 2020-10-28 ENCOUNTER — Encounter: Payer: Medicare Other | Admitting: Physical Therapy

## 2020-11-06 DIAGNOSIS — G4733 Obstructive sleep apnea (adult) (pediatric): Secondary | ICD-10-CM | POA: Diagnosis not present

## 2021-01-05 ENCOUNTER — Telehealth: Payer: Self-pay | Admitting: Adult Health

## 2021-01-05 NOTE — Telephone Encounter (Signed)
LVM for patient to call back to reschedule due to NP being out.

## 2021-01-28 DIAGNOSIS — Z85828 Personal history of other malignant neoplasm of skin: Secondary | ICD-10-CM | POA: Diagnosis not present

## 2021-01-28 DIAGNOSIS — Z86006 Personal history of melanoma in-situ: Secondary | ICD-10-CM | POA: Diagnosis not present

## 2021-01-28 DIAGNOSIS — L821 Other seborrheic keratosis: Secondary | ICD-10-CM | POA: Diagnosis not present

## 2021-01-28 DIAGNOSIS — L219 Seborrheic dermatitis, unspecified: Secondary | ICD-10-CM | POA: Diagnosis not present

## 2021-02-02 IMAGING — CT CT HEAD W/O CM
3 series · 16 of 47 positions shown, 19 images · non-contrast
Comparison: Brain MRI dated 08/29/2008. Head CT dated 08/28/2008.

CLINICAL DATA: Right-sided numbness and aphasia for approximately
15 minutes earlier this evening.

EXAM:
CT HEAD WITHOUT CONTRAST
TECHNIQUE: Contiguous axial images were obtained from the base of the skull
through the vertex without intravenous contrast.

[Series 3: head wo · axial · 0.42mm/px · z∈[-151,-26]mm · 10 of 30 slices shown, 13 images]
[im 3/30  brain]
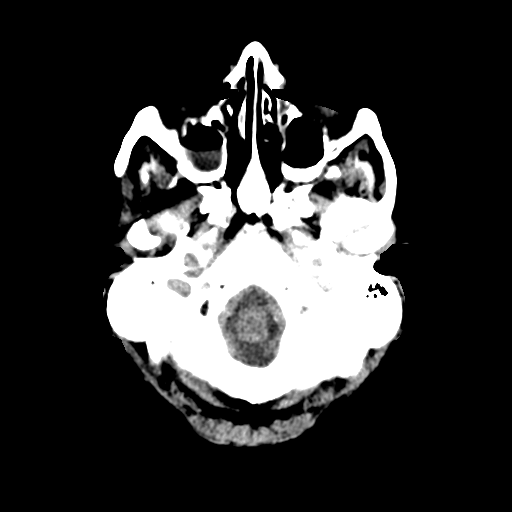
[im 3/30  bone]
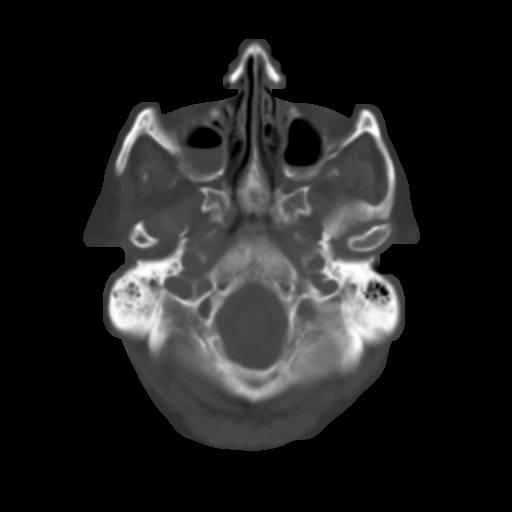
[im 6/30  brain]
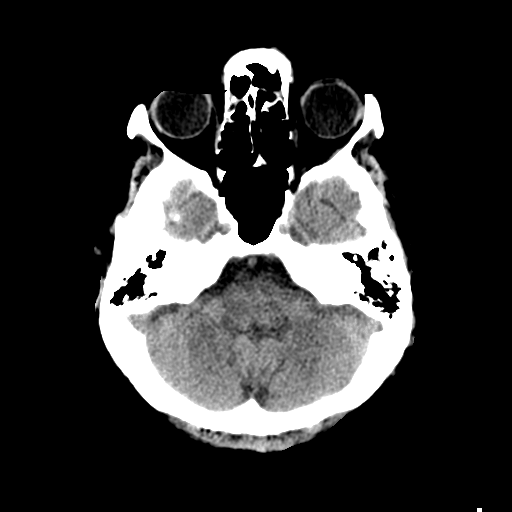
[im 9/30  brain]
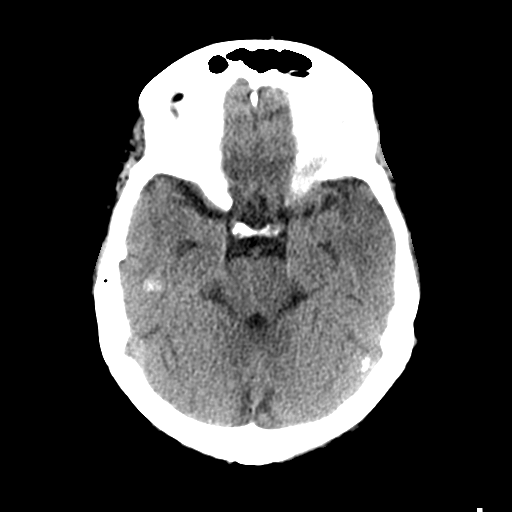
[im 11/30  brain]
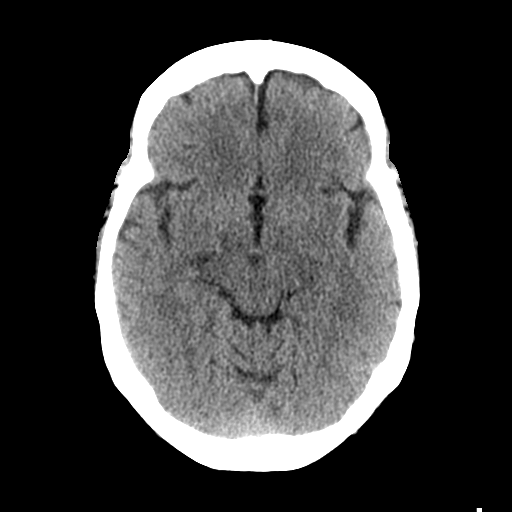
[im 14/30  brain]
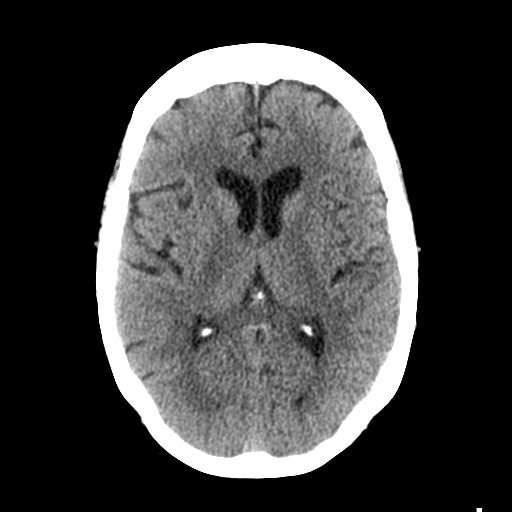
[im 14/30  bone]
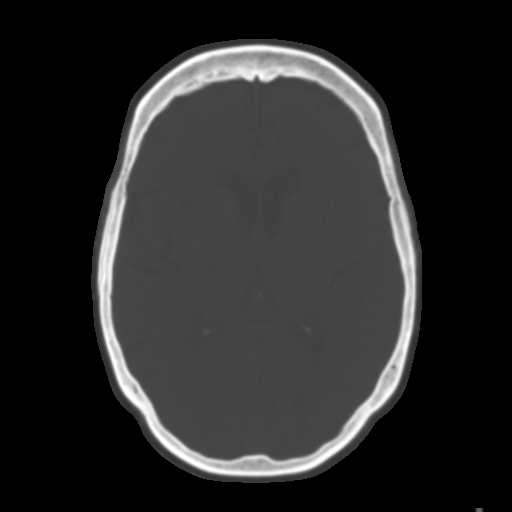
[im 17/30  brain]
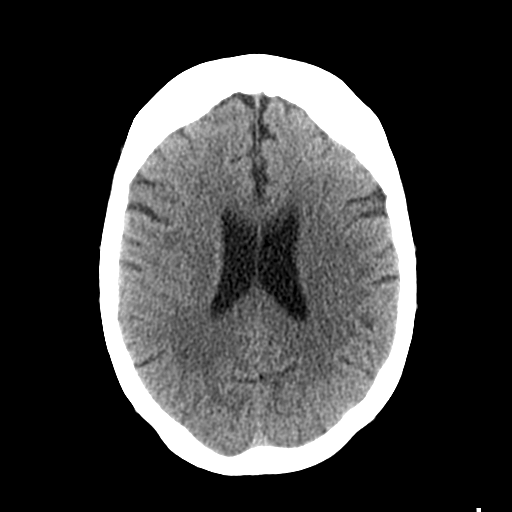
[im 20/30  brain]
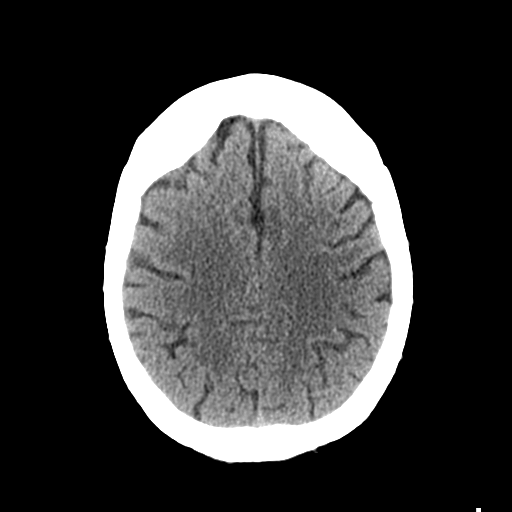
[im 23/30  brain]
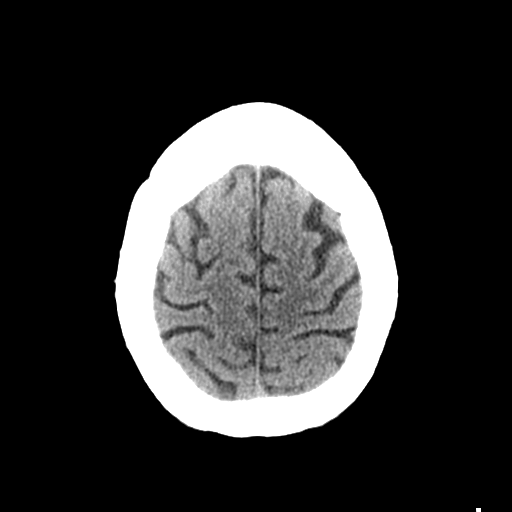
[im 25/30  brain]
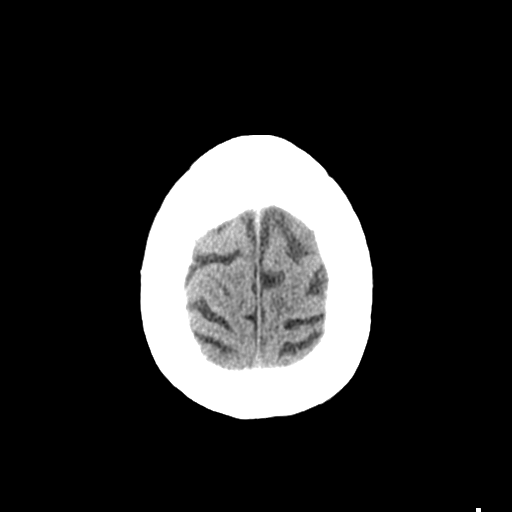
[im 25/30  bone]
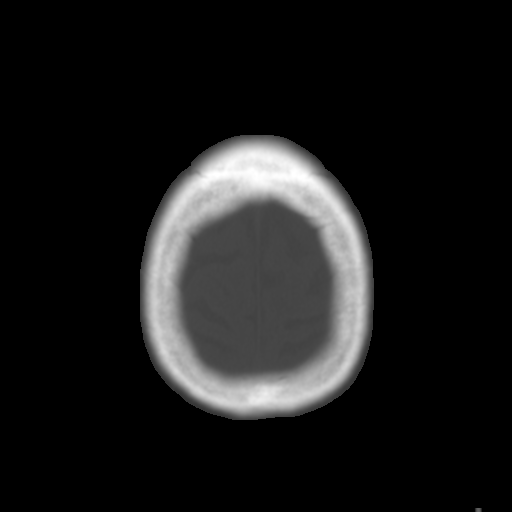
[im 28/30  brain]
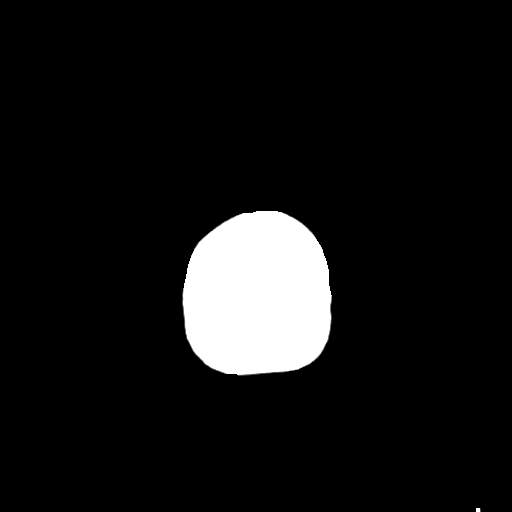

[Series 4: coronal soft tissue · coronal · 0.28mm/px · 3 of 62 slices shown]
[im 21/62  brain]
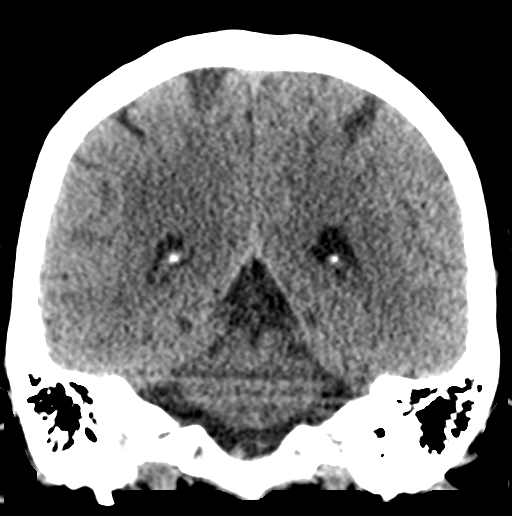
[im 28/62  brain]
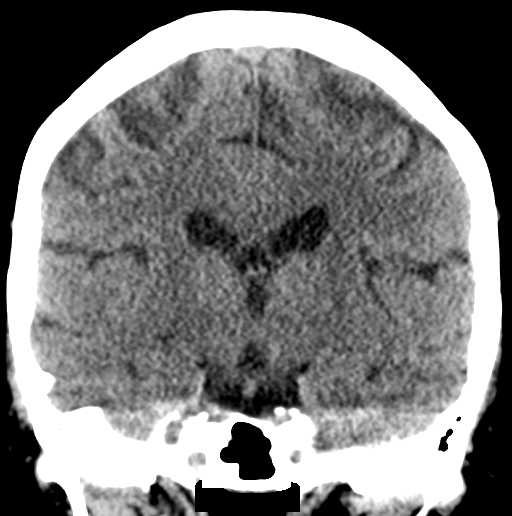
[im 34/62  brain]
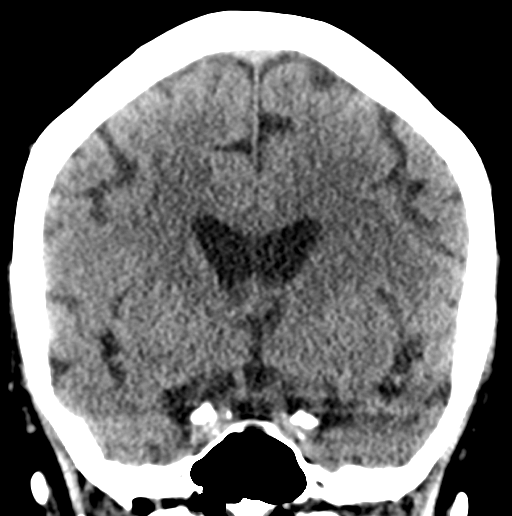

[Series 5: sagittal soft tissue · sagittal · 0.28mm/px · 3 of 48 slices shown]
[im 16/48  brain]
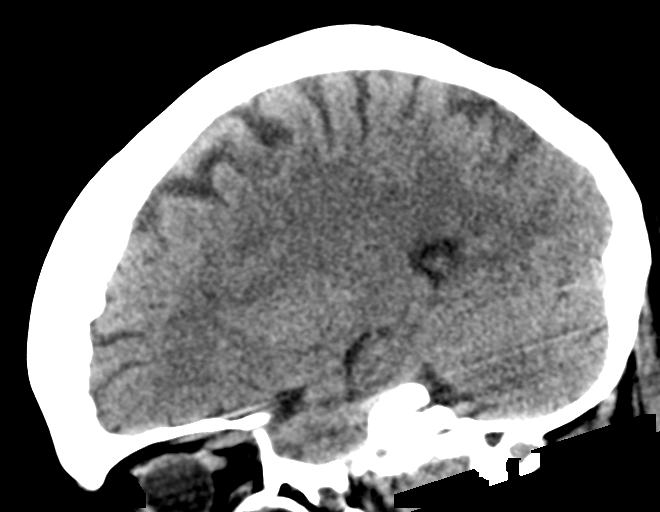
[im 24/48  brain]
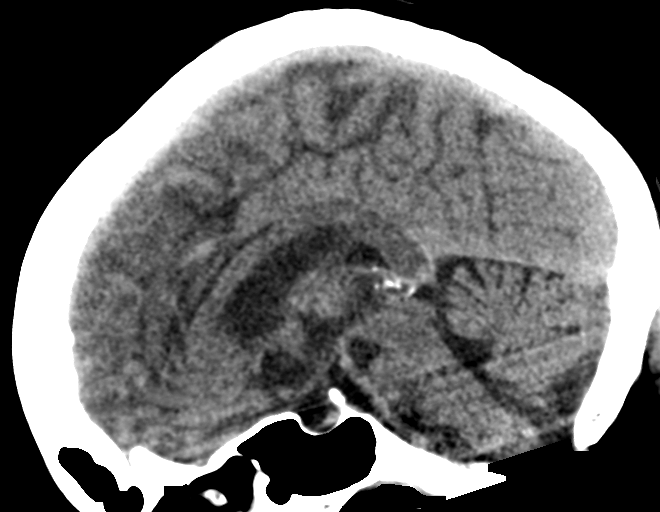
[im 32/48  brain]
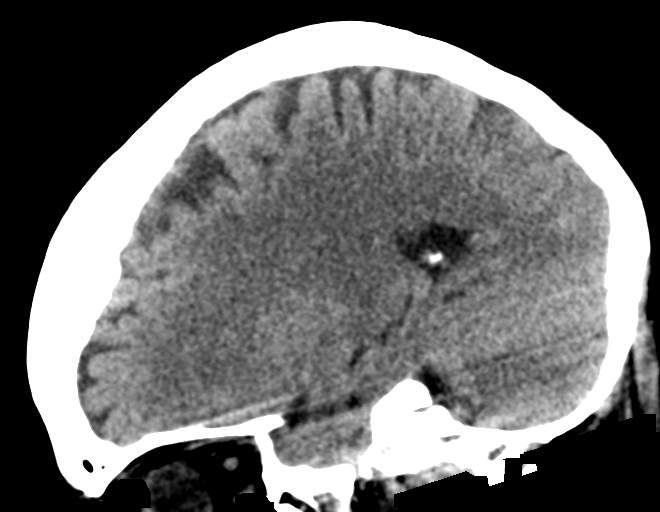

[16 of 47 positions shown; findings below may reference images not displayed]

FINDINGS: Brain: Minimally prominent ventricles and subarachnoid spaces with
minimal progression. No intracranial hemorrhage, mass lesion or CT
evidence of acute infarction.

Vascular: No hyperdense vessel or unexpected calcification.

Skull: Normal. Negative for fracture or focal lesion.

Sinuses/Orbits: Status post bilateral cataract extraction. Bilateral
maxillary sinus air-fluid levels. Mild bilateral ethmoid sinus
mucosal thickening and mild left frontal sinus mucosal thickening.

Other: None.
IMPRESSION: 1. No acute abnormality.
2. Minimal diffuse cerebral and cerebellar atrophy with minimal
progression.
3. Acute bilateral maxillary sinusitis.

## 2021-02-10 DIAGNOSIS — G4733 Obstructive sleep apnea (adult) (pediatric): Secondary | ICD-10-CM | POA: Diagnosis not present

## 2021-02-15 ENCOUNTER — Ambulatory Visit: Payer: Medicare Other | Admitting: Adult Health

## 2021-02-18 DIAGNOSIS — H1045 Other chronic allergic conjunctivitis: Secondary | ICD-10-CM | POA: Diagnosis not present

## 2021-02-18 DIAGNOSIS — D3132 Benign neoplasm of left choroid: Secondary | ICD-10-CM | POA: Diagnosis not present

## 2021-02-18 DIAGNOSIS — Z961 Presence of intraocular lens: Secondary | ICD-10-CM | POA: Diagnosis not present

## 2021-02-18 DIAGNOSIS — D3131 Benign neoplasm of right choroid: Secondary | ICD-10-CM | POA: Diagnosis not present

## 2021-03-04 ENCOUNTER — Ambulatory Visit: Payer: Medicare Other | Admitting: Adult Health

## 2021-03-04 ENCOUNTER — Encounter: Payer: Self-pay | Admitting: Adult Health

## 2021-03-04 VITALS — BP 138/75 | HR 80 | Ht 63.0 in | Wt 195.2 lb

## 2021-03-04 DIAGNOSIS — G4733 Obstructive sleep apnea (adult) (pediatric): Secondary | ICD-10-CM

## 2021-03-04 DIAGNOSIS — G459 Transient cerebral ischemic attack, unspecified: Secondary | ICD-10-CM | POA: Diagnosis not present

## 2021-03-04 DIAGNOSIS — Z9989 Dependence on other enabling machines and devices: Secondary | ICD-10-CM

## 2021-03-04 NOTE — Progress Notes (Addendum)
[facility] [address] [place]. [place] [address] [phone]       OFFICE FOLLOW UP NOTE  [name] Date of Birth:  1941-05-27 Medical Record Number:  [id number]    [place] stroke provider: Dr. Leonie Man [facility] sleep provider: Dr. Rexene Alberts   Reason for visit: TIA and sleep apnea follow-up    CHIEF COMPLAINT:  Chief Complaint  Patient presents with   OSA on CPAP    Rm 14,  6 month FU "not sure the mask fits well at nose; when I move I can hear an air leak but then its fine when I get settled"      HPI:   Today, 03/05/2019, Kerry [name] returns for 60-month TIA and CPAP follow-up.  TIA: Stable.  Denies new or reoccurring stroke/TIA symptoms.  Compliant on aspirin and atorvastatin without associated side effects.  Blood pressure today 138/75.   CPAP: She reports continued difficulty tolerating nasal nasal mask due to frequent leaks as well as seasonal allergies.  Order placed at prior visit for mask refitting but she reports she was not contacted.  She will use a neck pillow to try to keep her head still as this will reduce mask leak.  She will also sometimes fall asleep in her recliner prior to placing mask on.  She has no other concerns today.         History provided for reference purposes only Update 08/17/2020 JM: Kerry [name] returns for 45-month TIA and CPAP follow-up.  Stable from stroke/TIA standpoint continuing on aspirin and atorvastatin without side effects.  Blood pressure today 138/68. Monitors at home and has been stable.  Reports running out of carvedilol and does not have cardiology follow-up until next week and has been taking metoprolol which was leftover from a prior prescription.  Compliance report from 07/14/2020 -08/12/2020 shows 22 out of 30 usage days for 73% compliance and 19 days greater than 4 hours for 63% compliance.  Average usage 5 hours and 42 minutes.  Residual AHI 4.8.  Pressure setting min pressure 5 and max pressure 11 with pressure  in the 95th percentile 10.8.  Leaks in the 95 percentile 45.7.  She has been experiencing difficulty tolerating due to frequent mask leaks and difficulty getting her nasal mask to correctly fit despite recently getting new supplies.  Also experiencing increase congestion and sinus pressure. ESS 7.  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 no further concerns at this time.  Update 02/26/2020 JM: Kerry [name] is being seen for initial CPAP compliance visit with prior history of TIA. Compliance report reviewed from 01/26/2020 -02/24/2020 showing 28 out of 30 usage days with 27 days greater than 4 hours for 90% compliance with average usage 6 hours and 59 minutes.  Residual AHI 2.1 with min pressure 5 cm H2O and max pressure of 11 cm H2O with EPR level 1.  Pressure in the 95th percentile 10.7.  Leaks in the 95th percentile 45.5 L/min. She does report mild improvement of daytime fatigue where she continues to work without difficulty and typically only becomes tired in the evening.  ESS 6 (reading, watching TV and laying down in afternoon).  She does report waking up with dry mouth in the morning as well as difficulty with allergies and increased congestion.  Reports occasional leaks when laying on her side but will subside after mask adjustment. She continues to follow with [facility] for needed supplies and is currently up to date on supplies. Stable from TIA standpoint without new or reoccurring stroke/TIA symptoms.  Remains on aspirin and atorvastatin for secondary stroke prevention.  Blood pressure today 133/71.  Continues to follow routinely with PCP/cardiology for HTN and HLD management.  No stroke/TIA related concerns at this time.   Update 11/28/2019 JM:: Kerry [name] is a 80 year old female who is being seen today, 11/28/2019, for TIA follow-up.  She has been doing well from a stroke standpoint reoccurring stroke/TIA symptoms.  She  was referred to [facility] for suspected sleep apnea evaluated by Dr. Rexene Alberts and underwent sleep study on 10/13/2019 which did show mild sleep apnea with AHI 11.9/h and recommended initiation of AutoPap.  Per review of phone conversation on 10/24/2019, patient wanted to further consider use of AutoPap and at this time, is interested in pursuing.  Continues on aspirin and recently initiated atorvastatin by cardiology for secondary stroke prevention without side effects.  Blood pressure today satisfactory at 124/58.  No further concerns at this time.  Initial visit 07/30/2019 JM: Kerry [name] is a 80 year old female who is being seen today for hospital follow-up.  Has been stable since discharge without new or reoccurring stroke/TIA symptoms.  She has returned to work at [employer] without difficulty.  She does experience bilateral lower extremity pain and right shoulder pain which appears to be related to arthritis.  She does endorse daytime fatigue where she can easily lay down to take a nap or will fall asleep watching TV.  She does have to sit upright in a recliner while sleeping.  At times she will feel increased shortness of breath with exertion and is unsure if this is related to her asthma.  She does occasionally experience visual aura migraines 2-3 times per year with longstanding history of migraines.  She also experiences occasional double left frontal pain typically occurring with increased anxiety or stress.  Completed 3 weeks DAPT and continues on aspirin alone without bleeding or bruising.  Continues on Zetia without side effects.  Blood pressure today 145/69.  Follow-up with cardiology scheduled on 08/06/2019 to discuss 30-day cardiac event monitor.  Has appointment with a different cardiologist in December with patient reporting to follow-up regarding CHF.  No further concerns at this time.   Stroke admission 06/20/2019: Kerry [name] is a 80 y.o. female with history of HTN, HLD, migraines,  endometrial cancer s/p TAH 1987  presented on 06/20/2019 with transient slurred speech with word finding difficulty and right sided numbness.    Stroke work-up completed with symptoms likely related to left brain cortical TIA which appeared embolic in origin.  MRI no acute stroke with evidence of small vessel disease.  MRA negative for LVO with mild arthrosclerosis and bilateral maxillary sinus fluid levels.  Carotid Doppler bilateral ICA 1 to 39% stenosis in VAs antegrade.  2D echo EF of 45 to 50% with some diastolic function but no evidence of cardiac source of embolus..  Lower extremity venous Dopplers negative for DVT.  Recommended loop recorder placement to assess for atrial fibrillation but patient declined and was agreeable to do outpatient cardiac monitoring and recommend follow-up with cardiology outpatient to initiate.  LDL 91.  A1c 5.6.  Recommended DAPT for 3 weeks and aspirin alone.  HTN stable.  Initiated Zetia 10 mg daily with history of statin intolerance.  Other stroke risk factors include new combined systolic/diastolic CHF, advanced age and migraines but no prior history of stroke.  Discharged  home in stable condition without therapy needs.    ROS:   14 system review of systems performed and negative with exception of those listed in HPI  PMH:  Past Medical History:  Diagnosis Date   Allergy    Anemia    in her 20's   Aortic insufficiency    mild by echo 10/2020   Arthritis    not dx'd- just per pt    Asthma    AS A CHILD   Cancer ([place])    endometrial cancer with hysterectomy in 1987   Cancer ([place])    melanoma on face    Cataract    removed both eyes    DCM (dilated cardiomyopathy) ([place])    EF 50-55%   GERD (gastroesophageal reflux disease)    occ has with diet    History of syncope    HTN (hypertension)    Hyperlipidemia    OSA on CPAP    TIA (transient ischemic attack)     PSH:  Past Surgical History:  Procedure Laterality Date   ABDOMINAL HYSTERECTOMY      ANKLE FRACTURE SURGERY     APPENDECTOMY     CATARACT EXTRACTION, BILATERAL     CHOLECYSTECTOMY     COLONOSCOPY  08/01/2007   Dr Henrene Pastor    DENTAL SURGERY     ovary removal     right leg  fracture surgery       Social History:  Social History   Socioeconomic History   Marital status: Widowed    Spouse name: Not on file   Number of children: 2   Years of education: Not on file   Highest education level: Not on file  Occupational History   Not on file  Tobacco Use   Smoking status: Never   Smokeless tobacco: Never  Vaping Use   Vaping Use: Never used  Substance and Sexual Activity   Alcohol use: No   Drug use: No   Sexual activity: Not Currently  Other Topics Concern   Not on file  Social History Narrative   Lives at home alone   1 cup of coffee per day    Works at [employer] Strain: Not on file  Food Insecurity: Not on file  Transportation Needs: Not on file  Physical Activity: Not on file  Stress: Not on file  Social Connections: Not on file  Intimate Partner Violence: Not on file    Family History:  Family History  Problem Relation Age of Onset   Mitral valve prolapse Daughter    Heart disease Mother    Kidney failure Mother    Diabetes Mother    Hyperlipidemia Mother    Hypertension Mother    Uterine cancer Mother    Dementia Father    Diabetes Sister    Multiple sclerosis Sister    Diabetes Paternal Grandmother    Throat cancer Brother        smoker   Colon cancer Neg Hx    Colon polyps Neg Hx    Esophageal cancer Neg Hx    Rectal cancer Neg Hx    Stomach cancer Neg Hx     Medications:   Current Outpatient Medications on File Prior to Visit  Medication Sig Dispense Refill   acetaminophen (TYLENOL) 500 MG tablet Take 500 mg by mouth every 6 (six) hours as needed for moderate pain or headache.      acetic acid-hydrocortisone (  VOSOL-HC) OTIC solution Place 3 drops into both ears 3  (three) times daily. 10 mL 0   albuterol (VENTOLIN HFA) 108 (90 Base) MCG/ACT inhaler Inhale 2 puffs into the lungs every 6 (six) hours as needed for wheezing or shortness of breath. (Patient taking differently: Inhale 2 puffs into the lungs as needed for wheezing or shortness of breath.) 1 Inhaler 0   Ascorbic Acid (VITAMIN C PO) Take 1 tablet by mouth daily.     aspirin 81 MG chewable tablet Chew 81 mg by mouth daily. Every other day per pt     atorvastatin (LIPITOR) 20 MG tablet TAKE 1 TABLET(20 MG) BY MOUTH DAILY 90 tablet 3   carvedilol (COREG) 12.5 MG tablet Take 1 tablet (12.5 mg total) by mouth 2 (two) times daily. 180 tablet 3   Cholecalciferol (VITAMIN D3) 2000 units capsule Take 2,000 Units by mouth daily.     Coenzyme Q10 (CO Q 10 PO) Take 1 capsule by mouth daily.     fexofenadine (ALLEGRA) 180 MG tablet Take 180 mg by mouth daily.     hydrochlorothiazide (HYDRODIURIL) 25 MG tablet TAKE 1 TABLET BY MOUTH EVERY DAY 90 tablet 3   lisinopril (ZESTRIL) 20 MG tablet TAKE 1 TABLET BY MOUTH EVERY DAY 90 tablet 3   Multiple Vitamin (MULTIVITAMIN) tablet Take 1 tablet by mouth daily.     triamcinolone cream (KENALOG) 0.1 % Apply topically 2 (two) times daily. X 2 weeks     No current facility-administered medications on file prior to visit.    Allergies:   Allergies  Allergen Reactions   Lovastatin Other (See Comments)    myalgias   Sudafed [Pseudoephedrine Hcl] Other (See Comments)    Hallucinations      Physical Exam  Vitals:   03/04/21 0746  BP: 138/75  Pulse: 80  Weight: 195 lb 3.2 oz (88.5 kg)  Height: 5\' 3"  (1.6 m)    Body mass index is 34.58 kg/m. No results found.  General: well developed, well nourished, very pleasant elderly Caucasian female, seated, in no evident distress Head: head normocephalic and atraumatic.   Neck: supple with no carotid or supraclavicular bruits Cardiovascular: regular rate and rhythm, no murmurs Musculoskeletal: Multiple bilateral  hand arthritic nodules Skin:  no rash/petichiae Vascular:  Normal pulses all extremities   Neurologic Exam Mental Status: Awake and fully alert.   Fluent speech and language. Oriented to place and time. Recent and remote memory intact. Attention span, concentration and fund of knowledge appropriate. Mood and affect appropriate.  Cranial Nerves: Pupils equal, briskly reactive to light. Extraocular movements full without nystagmus. Visual fields full to confrontation. Hearing intact. Facial sensation intact. Face, tongue, palate moves normally and symmetrically.  Motor: Normal bulk and tone. Normal strength in all tested extremity muscles. Sensory.: intact to touch , pinprick , position and vibratory sensation.  Coordination: Rapid alternating movements normal in all extremities. Finger-to-nose and heel-to-shin performed accurately bilaterally. Gait and Station: Arises from chair without difficulty. Stance is normal. Gait demonstrates normal stride length and balance without use of assistive device Reflexes: 1+ and symmetric. Toes downgoing.          ASSESSMENT/PLAN: Kerry [name] is a 80 y.o. year old female with underlying medical history of TIA 06/2019, CHF, HTN, HLD, migraines and OSA on CPAP.      OSA on CPAP: Difficulty tolerating current mask due to frequent leaks and uncomfortable fitting.  Reports currently using ResMed nose mask. Spoke with Meagan (sleep Freight forwarder) who contacted  adapt health manager who plans to follow-up with patient as she was not contacted for mask refitting with order placed at prior visit 6 months ago.  Leaks in the 95th percentile 41.8 with greater than 4-hour compliance 50 % and residual AHI 4.3. Also discussed importance of increasing usage and try to avoid falling asleep in her recliner Will pull new download in 2 months for reassessment Continue current settings   Hx of TIA:  Stable without new recurrent stroke/TIA symptoms  continue aspirin 81 mg  daily  and atorvastatin for secondary stroke prevention.    Discussed secondary stroke prevention measures and importance of close PCP and maintain strict control of hypertension with blood pressure goal below 130/90 and cholesterol with LDL cholesterol (bad cholesterol) goal below 70 mg/dL.     Follow-up in 6 months or call earlier if needed   CC:  Maximiano Coss, NP Dr. Leonie Man (stroke) Dr. Rexene Alberts (sleep)   I spent 42 minutes of face-to-face and non-face-to-face time with patient.  This included previsit chart review, lab review, order entry, electronic health record documentation, and patient education and discussion regarding review of CPAP compliance and continue mask difficulties, further options and importance of increasing compliance, prior history of TIA and managing stroke risk factors and answered all other questions to patient satisfaction  Frann Rider, AGNP-BC  [facility] [address] [place], [place] [address]  Phone [phone] Fax [phone] Note: This document was prepared with digital dictation and possible smart phrase technology. Any transcriptional errors that result from this process are unintentional.  I reviewed the above note and documentation by the Nurse Practitioner and agree with the history, exam, assessment and plan as outlined above. I was available for consultation. Star Age, MD, PhD [facility])

## 2021-03-11 ENCOUNTER — Other Ambulatory Visit: Payer: Self-pay

## 2021-03-11 ENCOUNTER — Encounter: Payer: Self-pay | Admitting: Family Medicine

## 2021-03-11 ENCOUNTER — Ambulatory Visit (INDEPENDENT_AMBULATORY_CARE_PROVIDER_SITE_OTHER): Payer: Medicare Other | Admitting: Family Medicine

## 2021-03-11 VITALS — BP 134/62 | HR 67 | Temp 98.4°F | Ht 62.5 in | Wt 192.8 lb

## 2021-03-11 DIAGNOSIS — E041 Nontoxic single thyroid nodule: Secondary | ICD-10-CM | POA: Diagnosis not present

## 2021-03-11 DIAGNOSIS — I10 Essential (primary) hypertension: Secondary | ICD-10-CM

## 2021-03-11 DIAGNOSIS — E7800 Pure hypercholesterolemia, unspecified: Secondary | ICD-10-CM

## 2021-03-11 DIAGNOSIS — J301 Allergic rhinitis due to pollen: Secondary | ICD-10-CM | POA: Diagnosis not present

## 2021-03-11 NOTE — Progress Notes (Addendum)
Established Patient Office Visit  Subjective:  Patient ID: Kerry [name], female    DOB: 1941-02-23  Age: 80 y.o. MRN: [id number]  CC:  Chief Complaint  Patient presents with   [place] presents for establishment of care by way of transfer follow-up of her elevated cholesterol hypertension and history of thyroid nodule.  Cholesterol is well controlled with atorvastatin.  Blood pressures controlled with lisinopril, HCTZ and carvedilol.  Distant history of thyroid nodule that needs follow-up.  History of allergy rhinitis with occasional reactive airway disease.  She sometimes uses an albuterol inhaler for this issue.  Medical record was reviewed  Past Medical History:  Diagnosis Date   Allergy    Anemia    in her 20's   Aortic insufficiency    mild by echo 10/2020   Arthritis    not dx'd- just per pt    Asthma    AS A CHILD   Cancer ([place])    endometrial cancer with hysterectomy in 1987   Cancer ([place])    melanoma on face    Cataract    removed both eyes    DCM (dilated cardiomyopathy) ([place])    EF 50-55%   GERD (gastroesophageal reflux disease)    occ has with diet    History of syncope    HTN (hypertension)    Hyperlipidemia    OSA on CPAP    TIA (transient ischemic attack)     Past Surgical History:  Procedure Laterality Date   ABDOMINAL HYSTERECTOMY     ANKLE FRACTURE SURGERY     APPENDECTOMY     CATARACT EXTRACTION, BILATERAL     CHOLECYSTECTOMY     COLONOSCOPY  08/01/2007   Dr Henrene Pastor    DENTAL SURGERY     ovary removal     right leg  fracture surgery       Family History  Problem Relation Age of Onset   Mitral valve prolapse Daughter    Heart disease Mother    Kidney failure Mother    Diabetes Mother    Hyperlipidemia Mother    Hypertension Mother    Uterine cancer Mother    Dementia Father    Diabetes Sister    Multiple sclerosis Sister    Diabetes Paternal Grandmother    Throat cancer Brother        smoker    Colon cancer Neg Hx    Colon polyps Neg Hx    Esophageal cancer Neg Hx    Rectal cancer Neg Hx    Stomach cancer Neg Hx     Social History   Socioeconomic History   Marital status: Widowed    Spouse name: Not on file   Number of children: 2   Years of education: Not on file   Highest education level: Not on file  Occupational History   Not on file  Tobacco Use   Smoking status: Never   Smokeless tobacco: Never  Vaping Use   Vaping Use: Never used  Substance and Sexual Activity   Alcohol use: No   Drug use: No   Sexual activity: Not Currently  Other Topics Concern   Not on file  Social History Narrative   Lives at home alone   1 cup of coffee per day    Works at [employer] Strain: Not on file  Food Insecurity: Not on file  Transportation  Needs: Not on file  Physical Activity: Not on file  Stress: Not on file  Social Connections: Not on file  Intimate Partner Violence: Not on file    Outpatient Medications Prior to Visit  Medication Sig Dispense Refill   acetaminophen (TYLENOL) 500 MG tablet Take 500 mg by mouth every 6 (six) hours as needed for moderate pain or headache.      acetic acid-hydrocortisone (VOSOL-HC) OTIC solution Place 3 drops into both ears 3 (three) times daily. 10 mL 0   albuterol (VENTOLIN HFA) 108 (90 Base) MCG/ACT inhaler Inhale 2 puffs into the lungs every 6 (six) hours as needed for wheezing or shortness of breath. (Patient taking differently: Inhale 2 puffs into the lungs as needed for wheezing or shortness of breath.) 1 Inhaler 0   Ascorbic Acid (VITAMIN C PO) Take 1 tablet by mouth daily.     aspirin 81 MG chewable tablet Chew 81 mg by mouth daily. Every other day per pt     atorvastatin (LIPITOR) 20 MG tablet TAKE 1 TABLET(20 MG) BY MOUTH DAILY 90 tablet 3   carvedilol (COREG) 12.5 MG tablet Take 1 tablet (12.5 mg total) by mouth 2 (two) times daily. 180 tablet 3   Cholecalciferol  (VITAMIN D3) 2000 units capsule Take 2,000 Units by mouth daily.     Coenzyme Q10 (CO Q 10 PO) Take 1 capsule by mouth daily.     fexofenadine (ALLEGRA) 180 MG tablet Take 180 mg by mouth daily.     hydrochlorothiazide (HYDRODIURIL) 25 MG tablet TAKE 1 TABLET BY MOUTH EVERY DAY 90 tablet 3   lisinopril (ZESTRIL) 20 MG tablet TAKE 1 TABLET BY MOUTH EVERY DAY 90 tablet 3   Multiple Vitamin (MULTIVITAMIN) tablet Take 1 tablet by mouth daily.     triamcinolone cream (KENALOG) 0.1 % Apply topically 2 (two) times daily. X 2 weeks     No facility-administered medications prior to visit.    Allergies  Allergen Reactions   Lovastatin Other (See Comments)    myalgias   Sudafed [Pseudoephedrine Hcl] Other (See Comments)    Hallucinations     ROS Review of Systems  Constitutional:  Negative for fatigue, fever and unexpected weight change.  HENT:  Positive for congestion, postnasal drip, rhinorrhea and sneezing.   Eyes:  Negative for photophobia and visual disturbance.  Respiratory: Negative.    Cardiovascular: Negative.   Gastrointestinal: Negative.   Genitourinary: Negative.   Neurological:  Negative for speech difficulty and weakness.  Psychiatric/Behavioral: Negative.       Objective:    Physical Exam Vitals and nursing note reviewed.  Constitutional:      General: She is not in acute distress.    Appearance: Normal appearance. She is not ill-appearing, toxic-appearing or diaphoretic.  HENT:     Head: Normocephalic and atraumatic.     Right Ear: Tympanic membrane, ear canal and external ear normal.     Left Ear: Tympanic membrane, ear canal and external ear normal.     Mouth/Throat:     Mouth: Mucous membranes are dry.     Pharynx: Oropharynx is clear.  Eyes:     General: No scleral icterus.       Right eye: No discharge.        Left eye: No discharge.     Extraocular Movements: Extraocular movements intact.     Conjunctiva/sclera: Conjunctivae normal.     Pupils: Pupils  are equal, round, and reactive to light.  Neck:  Thyroid: No thyromegaly or thyroid tenderness.     Vascular: No carotid bruit.  Cardiovascular:     Rate and Rhythm: Normal rate and regular rhythm.  Pulmonary:     Effort: Pulmonary effort is normal.     Breath sounds: Normal breath sounds. No wheezing.  Abdominal:     General: Bowel sounds are normal.  Musculoskeletal:     Cervical back: No rigidity or tenderness.  Lymphadenopathy:     Cervical: No cervical adenopathy.  Skin:    General: Skin is warm and dry.  Neurological:     General: No focal deficit present.     Mental Status: She is alert and oriented to person, place, and time.  Psychiatric:        Mood and Affect: Mood normal.        Behavior: Behavior normal.    BP 134/62   Pulse 67   Temp 98.4 F (36.9 C)   Ht 5' 2.5" (1.588 m)   Wt 192 lb 12.8 oz (87.5 kg)   SpO2 96%   BMI 34.70 kg/m  Wt Readings from Last 3 Encounters:  03/11/21 192 lb 12.8 oz (87.5 kg)  03/04/21 195 lb 3.2 oz (88.5 kg)  08/25/20 187 lb (84.8 kg)     Health Maintenance Due  Topic Date Due   Zoster Vaccines- Shingrix (1 of 2) Never done   COVID-19 Vaccine (4 - Booster) 11/01/2020    There are no preventive care reminders to display for this patient.  Lab Results  Component Value Date   TSH 2.33 03/12/2021   Lab Results  Component Value Date   WBC 6.9 03/12/2021   HGB 11.8 (L) 03/12/2021   HCT 35.3 (L) 03/12/2021   MCV 91.2 03/12/2021   PLT 217.0 03/12/2021   Lab Results  Component Value Date   NA 138 03/12/2021   K 4.8 03/12/2021   CO2 28 03/12/2021   GLUCOSE 86 03/12/2021   BUN 26 (H) 03/12/2021   CREATININE 0.85 03/12/2021   BILITOT 0.7 03/12/2021   ALKPHOS 90 03/12/2021   AST 18 03/12/2021   ALT 14 03/12/2021   PROT 7.1 03/12/2021   ALBUMIN 4.0 03/12/2021   CALCIUM 9.7 03/12/2021   ANIONGAP 11 06/21/2019   GFR 65.22 03/12/2021   Lab Results  Component Value Date   CHOL 139 03/12/2021   Lab Results   Component Value Date   HDL 58.90 03/12/2021   Lab Results  Component Value Date   LDLCALC 68 03/12/2021   Lab Results  Component Value Date   TRIG 61.0 03/12/2021   Lab Results  Component Value Date   CHOLHDL 2 03/12/2021   Lab Results  Component Value Date   HGBA1C 5.6 06/21/2019      Assessment & Plan:   Problem List Items Addressed This Visit       Cardiovascular and Mediastinum   Essential hypertension - Primary   Relevant Orders   CBC (Completed)   Comprehensive metabolic panel (Completed)   Urinalysis, Routine w reflex microscopic (Completed)     Respiratory   Seasonal allergic rhinitis due to pollen     Endocrine   Right thyroid nodule   Relevant Orders   TSH (Completed)   US THYROID (Completed)   Ambulatory referral to Interventional Radiology     Other   Hypercholesterolemia   Relevant Orders   Comprehensive metabolic panel (Completed)   Lipid panel (Completed)    No orders of the defined types were placed in this  encounter.   Follow-up: Return in about 6 months (around 09/10/2021), or return fasting for blood work.Libby Maw, MD

## 2021-03-12 ENCOUNTER — Other Ambulatory Visit (INDEPENDENT_AMBULATORY_CARE_PROVIDER_SITE_OTHER): Payer: Medicare Other

## 2021-03-12 DIAGNOSIS — I10 Essential (primary) hypertension: Secondary | ICD-10-CM | POA: Diagnosis not present

## 2021-03-12 DIAGNOSIS — E7800 Pure hypercholesterolemia, unspecified: Secondary | ICD-10-CM

## 2021-03-12 DIAGNOSIS — E041 Nontoxic single thyroid nodule: Secondary | ICD-10-CM | POA: Diagnosis not present

## 2021-03-12 LAB — CBC
HCT: 35.3 % — ABNORMAL LOW (ref 36.0–46.0)
Hemoglobin: 11.8 g/dL — ABNORMAL LOW (ref 12.0–15.0)
MCHC: 33.6 g/dL (ref 30.0–36.0)
MCV: 91.2 fl (ref 78.0–100.0)
Platelets: 217 10*3/uL (ref 150.0–400.0)
RBC: 3.87 Mil/uL (ref 3.87–5.11)
RDW: 12.8 % (ref 11.5–15.5)
WBC: 6.9 10*3/uL (ref 4.0–10.5)

## 2021-03-12 LAB — COMPREHENSIVE METABOLIC PANEL
ALT: 14 U/L (ref 0–35)
AST: 18 U/L (ref 0–37)
Albumin: 4 g/dL (ref 3.5–5.2)
Alkaline Phosphatase: 90 U/L (ref 39–117)
BUN: 26 mg/dL — ABNORMAL HIGH (ref 6–23)
CO2: 28 mEq/L (ref 19–32)
Calcium: 9.7 mg/dL (ref 8.4–10.5)
Chloride: 103 mEq/L (ref 96–112)
Creatinine, Ser: 0.85 mg/dL (ref 0.40–1.20)
GFR: 65.22 mL/min (ref >60.00)
Glucose, Bld: 86 mg/dL (ref 70–99)
Potassium: 4.8 mEq/L (ref 3.5–5.1)
Sodium: 138 mEq/L (ref 135–145)
Total Bilirubin: 0.7 mg/dL (ref 0.2–1.2)
Total Protein: 7.1 g/dL (ref 6.0–8.3)

## 2021-03-12 LAB — LIPID PANEL
Cholesterol: 139 mg/dL (ref 0–200)
HDL: 58.9 mg/dL (ref >39.00)
LDL Cholesterol: 68 mg/dL (ref 0–99)
NonHDL: 79.8
Total CHOL/HDL Ratio: 2
Triglycerides: 61 mg/dL (ref 0.0–149.0)
VLDL: 12.2 mg/dL (ref 0.0–40.0)

## 2021-03-12 LAB — URINALYSIS, ROUTINE W REFLEX MICROSCOPIC
Bilirubin Urine: NEGATIVE
Hgb urine dipstick: NEGATIVE
Ketones, ur: NEGATIVE
Leukocytes,Ua: NEGATIVE
Nitrite: NEGATIVE
Specific Gravity, Urine: 1.01 (ref 1.000–1.030)
Total Protein, Urine: NEGATIVE
Urine Glucose: NEGATIVE
Urobilinogen, UA: 0.2 (ref 0.0–1.0)
pH: 6 (ref 5.0–8.0)

## 2021-03-12 LAB — TSH: TSH: 2.33 u[IU]/mL (ref 0.35–5.50)

## 2021-03-12 NOTE — Progress Notes (Signed)
I agree with the above plan 

## 2021-03-12 NOTE — Progress Notes (Signed)
Per the orders of Dr. Ethelene Hal pt is here for labs, pt tolerated draw well. Pt was able to leave adequate amount of urine for urine sample

## 2021-04-06 DIAGNOSIS — H524 Presbyopia: Secondary | ICD-10-CM | POA: Diagnosis not present

## 2021-04-07 ENCOUNTER — Ambulatory Visit (HOSPITAL_COMMUNITY)
Admission: RE | Admit: 2021-04-07 | Discharge: 2021-04-07 | Disposition: A | Discharge status: Home / Self Care | Payer: Medicare Other | Source: Ambulatory Visit | Attending: Family Medicine | Admitting: Family Medicine

## 2021-04-07 ENCOUNTER — Other Ambulatory Visit: Payer: Self-pay

## 2021-04-07 DIAGNOSIS — E041 Nontoxic single thyroid nodule: Secondary | ICD-10-CM | POA: Diagnosis not present

## 2021-04-08 NOTE — Progress Notes (Signed)
Thyroid nodule has increased in size slightly. Radiologist has recommended a biopsy. I have ordered it. We ill let you know when it is scheduled.

## 2021-04-08 NOTE — Addendum Note (Signed)
Addended by: Abelino Derrick A on: 04/08/2021 08:30 AM   Modules accepted: Orders

## 2021-04-12 DIAGNOSIS — Z1231 Encounter for screening mammogram for malignant neoplasm of breast: Secondary | ICD-10-CM | POA: Diagnosis not present

## 2021-04-15 NOTE — Addendum Note (Signed)
Addended by: Jon Billings on: 04/15/2021 08:17 AM   Modules accepted: Orders

## 2021-04-16 NOTE — Addendum Note (Signed)
Addended by: Jon Billings on: 04/16/2021 02:31 PM   Modules accepted: Orders

## 2021-04-22 ENCOUNTER — Other Ambulatory Visit: Payer: Self-pay | Admitting: Family Medicine

## 2021-04-22 DIAGNOSIS — E041 Nontoxic single thyroid nodule: Secondary | ICD-10-CM

## 2021-04-30 ENCOUNTER — Telehealth: Payer: Self-pay | Admitting: Family Medicine

## 2021-04-30 NOTE — Telephone Encounter (Signed)
Left message for patient to call back and schedule Medicare Annual Wellness Visit (AWV) in office.   If not able to come in office, please offer to do virtually or by telephone.  Left office number and my jabber [phone].  Last AWV:01/21/2019  Please schedule at anytime with Nurse Health Advisor.

## 2021-05-04 ENCOUNTER — Other Ambulatory Visit (HOSPITAL_COMMUNITY)
Admission: RE | Admit: 2021-05-04 | Discharge: 2021-05-04 | Disposition: A | Discharge status: Home / Self Care | Payer: Medicare Other | Source: Ambulatory Visit | Attending: Radiology | Admitting: Radiology

## 2021-05-04 ENCOUNTER — Ambulatory Visit
Admission: RE | Admit: 2021-05-04 | Discharge: 2021-05-04 | Disposition: A | Discharge status: Home / Self Care | Payer: Medicare Other | Source: Ambulatory Visit | Attending: Family Medicine | Admitting: Family Medicine

## 2021-05-04 DIAGNOSIS — E041 Nontoxic single thyroid nodule: Secondary | ICD-10-CM

## 2021-05-04 DIAGNOSIS — D34 Benign neoplasm of thyroid gland: Secondary | ICD-10-CM | POA: Diagnosis not present

## 2021-05-05 LAB — CYTOLOGY - NON PAP

## 2021-06-02 ENCOUNTER — Telehealth: Payer: Self-pay | Admitting: Family Medicine

## 2021-06-02 NOTE — Telephone Encounter (Signed)
Left message for patient to call back and schedule Medicare Annual Wellness Visit (AWV.   Please offer to do virtually or by telephone.  Left office number and my jabber #[phone].  Last AWV:01/21/2019  Please schedule at anytime with Nurse Health Advisor.   

## 2021-06-05 ENCOUNTER — Ambulatory Visit: Payer: Medicare Other

## 2021-06-05 DIAGNOSIS — Z0000 Encounter for general adult medical examination without abnormal findings: Secondary | ICD-10-CM

## 2021-06-05 NOTE — Progress Notes (Signed)
Subjective:   [name] is a 80 y.o. female who presents for Medicare Annual (Subsequent) preventive examination.  I connected with [place] on 06/05/2021 by a audio enabled telemedicine application and verified that I am speaking with the correct person using two identifiers.   I discussed the limitations of evaluation and management by telemedicine. The patient expressed understanding and agreed to proceed.   Location of Patient: HOME Location of Provider: OFFICE Persons participating in visit: [place], and [place], [place]   Review of Systems    Defer to PCP Cardiac Risk Factors include: advanced age (>31men, >39 women)     Objective:    There were no vitals filed for this visit. There is no height or weight on file to calculate BMI.  Advanced Directives 06/05/2021 08/10/2020 06/20/2019 02/22/2018 04/14/2017 04/14/2017 11/11/2016  Does Patient Have a Medical Advance Directive? No No No No No No No  Would patient like information on creating a medical advance directive? No - Patient declined No - Patient declined - No - Patient declined Yes (MAU/Ambulatory/Procedural Areas - Information given) Yes (Inpatient - patient defers creating a medical advance directive at this time) -    Current Medications (verified) Outpatient Encounter Medications as of 06/05/2021  Medication Sig   acetaminophen (TYLENOL) 500 MG tablet Take 500 mg by mouth every 6 (six) hours as needed for moderate pain or headache.    acetic acid-hydrocortisone (VOSOL-HC) OTIC solution Place 3 drops into both ears 3 (three) times daily.   albuterol (VENTOLIN HFA) 108 (90 Base) MCG/ACT inhaler Inhale 2 puffs into the lungs every 6 (six) hours as needed for wheezing or shortness of breath. (Patient taking differently: Inhale 2 puffs into the lungs as needed for wheezing or shortness of breath.)   Ascorbic Acid (VITAMIN C PO) Take 1 tablet by mouth daily.   aspirin 81 MG chewable tablet Chew 81 mg by mouth  daily. Every other day per pt   atorvastatin (LIPITOR) 20 MG tablet TAKE 1 TABLET(20 MG) BY MOUTH DAILY   carvedilol (COREG) 12.5 MG tablet Take 1 tablet (12.5 mg total) by mouth 2 (two) times daily.   Cholecalciferol (VITAMIN D3) 2000 units capsule Take 2,000 Units by mouth daily.   Coenzyme Q10 (CO Q 10 PO) Take 1 capsule by mouth daily.   fexofenadine (ALLEGRA) 180 MG tablet Take 180 mg by mouth daily.   hydrochlorothiazide (HYDRODIURIL) 25 MG tablet TAKE 1 TABLET BY MOUTH EVERY DAY   lisinopril (ZESTRIL) 20 MG tablet TAKE 1 TABLET BY MOUTH EVERY DAY   Multiple Vitamin (MULTIVITAMIN) tablet Take 1 tablet by mouth daily.   triamcinolone cream (KENALOG) 0.1 % Apply topically 2 (two) times daily. X 2 weeks   No facility-administered encounter medications on file as of 06/05/2021.    Allergies (verified) Lovastatin and Sudafed [pseudoephedrine hcl]   History: Past Medical History:  Diagnosis Date   Allergy    Anemia    in her 20's   Aortic insufficiency    mild by echo 10/2020   Arthritis    not dx'd- just per pt    Asthma    AS A CHILD   Cancer ([place])    endometrial cancer with hysterectomy in 1987   Cancer ([place])    melanoma on face    Cataract    removed both eyes    DCM (dilated cardiomyopathy) ([place])    EF 50-55%   GERD (gastroesophageal reflux disease)    occ has with diet  History of syncope    HTN (hypertension)    Hyperlipidemia    OSA on CPAP    TIA (transient ischemic attack)    Past Surgical History:  Procedure Laterality Date   ABDOMINAL HYSTERECTOMY     ANKLE FRACTURE SURGERY     APPENDECTOMY     CATARACT EXTRACTION, BILATERAL     CHOLECYSTECTOMY     COLONOSCOPY  08/01/2007   Dr Henrene Pastor    DENTAL SURGERY     ovary removal     right leg  fracture surgery      Family History  Problem Relation Age of Onset   Mitral valve prolapse Daughter    Heart disease Mother    Kidney failure Mother    Diabetes Mother    Hyperlipidemia Mother    Hypertension  Mother    Uterine cancer Mother    Dementia Father    Diabetes Sister    Multiple sclerosis Sister    Diabetes Paternal Grandmother    Throat cancer Brother        smoker   Colon cancer Neg Hx    Colon polyps Neg Hx    Esophageal cancer Neg Hx    Rectal cancer Neg Hx    Stomach cancer Neg Hx    Social History   Socioeconomic History   Marital status: Widowed    Spouse name: Not on file   Number of children: 2   Years of education: Not on file   Highest education level: Not on file  Occupational History   Not on file  Tobacco Use   Smoking status: Never   Smokeless tobacco: Never  Vaping Use   Vaping Use: Never used  Substance and Sexual Activity   Alcohol use: No   Drug use: No   Sexual activity: Not Currently  Other Topics Concern   Not on file  Social History Narrative   Lives at home alone   1 cup of coffee per day    Works at [employer] Strain: Not on file  Food Insecurity: Not on file  Transportation Needs: Not on file  Physical Activity: Not on file  Stress: Stress Concern Present   Feeling of Stress : To some extent  Social Connections: Moderately Isolated   Frequency of Communication with Friends and Family: More than three times a week   Frequency of Social Gatherings with Friends and Family: More than three times a week   Attends Religious Services: More than 4 times per year   Active Member of [employer] or Organizations: No   Attends Archivist Meetings: Never   Marital Status: Widowed    Tobacco Counseling Not needed- patient is not a smoker.  Clinical Intake:  Pre-visit preparation completed: Yes  Pain : No/denies pain     Diabetes: No  How often do you need to have someone help you when you read instructions, pamphlets, or other written materials from your doctor or pharmacy?: 1 - Never  Diabetic?No  Interpreter Needed?: No  Information entered by :: Wyatt Haste, [place] of Daily Living In your present state of health, do you have any difficulty performing the following activities: 06/05/2021 07/10/2020  Hearing? N N  Vision? N N  Difficulty concentrating or making decisions? N N  Walking or climbing stairs? N Y  Dressing or bathing? N N  Doing errands, shopping? N N  Preparing Food and eating ?  N -  Using the Toilet? N -  In the past six months, have you accidently leaked urine? Y -  Do you have problems with loss of bowel control? Y -  Managing your Medications? N -  Managing your Finances? N -  Housekeeping or managing your Housekeeping? N -  Some recent data might be hidden    Patient Care Team: Libby Maw, MD as PCP - General (Family Medicine) Arvella Nigh, MD as Consulting Physician (Obstetrics and Gynecology) Sueanne Margarita, MD as Consulting Physician (Cardiology)  Indicate any recent Medical Services you may have received from other than [facility] providers in the past year (date may be approximate).     Assessment:   This is a routine wellness examination for [place].  Hearing/Vision screen Hearing Screening - Comments:: No concerns at this time. Vision Screening - Comments:: Patient wears prescription glasses. See's Dr. Katy Fitch - [facility] on [address]  Dietary issues and exercise activities discussed: Current Exercise Habits: The patient does not participate in regular exercise at present, Exercise limited by: None identified   Goals Addressed             This Visit's Progress    Patient Stated       Wants to lose a little weight to have more energy.       Depression Screen PHQ 2/9 Scores 06/05/2021 07/10/2020 07/30/2019 06/26/2019 01/21/2019 01/21/2019 02/22/2018  PHQ - 2 Score 2 0 0 0 0 0 0  PHQ- 9 Score 4 - - - - - -    Fall Risk Fall Risk  06/05/2021 03/11/2021 07/10/2020 06/26/2019 01/21/2019  Falls in the past year? 1 0 0 0 0  Number falls in past yr: 0 0 0 0 0  Injury with  Fall? 0 0 0 0 0  Risk for fall due to : History of fall(s) - - - -  Follow up Falls evaluation completed - Falls evaluation completed - Falls evaluation completed    [place]:  Any stairs in or around the home? No  If so, are there any without handrails?  N/A Home free of loose throw rugs in walkways, pet beds, electrical cords, etc? Yes  Adequate lighting in your home to reduce risk of falls? Yes   ASSISTIVE DEVICES UTILIZED TO PREVENT FALLS:  Life alert? No  Use of a cane, walker or w/c? No  Grab bars in the bathroom? Yes  Shower chair or bench in shower? Yes  Elevated toilet seat or a handicapped toilet? Yes   TIMED UP AND GO:  Was the test performed? No .  Length of time to ambulate 10 feet: N/A sec.     Cognitive Function:     6CIT Screen 06/05/2021  What Year? 0 points  What month? 0 points  What time? 0 points  Count back from 20 0 points  Months in reverse 0 points  Repeat phrase 0 points  Total Score 0    Immunizations Immunization History  Administered Date(s) Administered   Influenza Split 06/19/2014   Influenza, High Dose Seasonal PF 07/16/2018   Influenza-Unspecified 05/31/2015, 06/13/2016, 06/23/2020   PFIZER(Purple Top)SARS-COV-2 Vaccination 11/06/2019, 11/27/2019   Pneumococcal Conjugate-13 04/14/2017   Pneumococcal Polysaccharide-23 06/28/2009    TDAP status: Due, Education has been provided regarding the importance of this vaccine. Advised may receive this vaccine at local pharmacy or Health Dept. Aware to provide a copy of the vaccination record if obtained from local pharmacy  or Health Dept. Verbalized acceptance and understanding.  Flu Vaccine status: Due, Education has been provided regarding the importance of this vaccine. Advised may receive this vaccine at local pharmacy or Health Dept. Aware to provide a copy of the vaccination record if obtained from local pharmacy or Health Dept. Verbalized acceptance  and understanding.  Pneumococcal vaccine status: Up to date  Covid-19 vaccine status: Completed vaccines  Qualifies for Shingles Vaccine? Yes   Zostavax completed No   Shingrix Completed?: No.    Education has been provided regarding the importance of this vaccine. Patient has been advised to call insurance company to determine out of pocket expense if they have not yet received this vaccine. Advised may also receive vaccine at local pharmacy or Health Dept. Verbalized acceptance and understanding.  Screening Tests Health Maintenance  Topic Date Due   Zoster Vaccines- Shingrix (1 of 2) Never done   COVID-19 Vaccine (4 - Booster) 10/24/2020   INFLUENZA VACCINE  04/12/2021   TETANUS/TDAP  07/10/2021 (Originally 11/23/1959)   Hepatitis C Screening  07/10/2021 (Originally 11/24/1958)   DEXA SCAN  Completed   HPV VACCINES  Aged Out    Health Maintenance  Health Maintenance Due  Topic Date Due   Zoster Vaccines- Shingrix (1 of 2) Never done   COVID-19 Vaccine (4 - Booster) 10/24/2020   INFLUENZA VACCINE  04/12/2021    Colorectal cancer screening: No longer required.   Mammogram status: No longer required due to Age.  Bone Density status: Completed 04/14/2014. Results reflect: Bone density results: NORMAL. Repeat every 2 years.  Lung Cancer Screening: (Low Dose CT Chest recommended if Age 22-80 years, 30 pack-year currently smoking OR have quit w/in 15years.) does not qualify.   Lung Cancer Screening Referral: Non smoker  Additional Screening:  Hepatitis C Screening: does qualify; Completed DUE  Vision Screening: Recommended annual ophthalmology exams for early detection of glaucoma and other disorders of the eye. Is the patient up to date with their annual eye exam?  Yes  Who is the provider or what is the name of the office in which the patient attends annual eye exams? Dr. Ebony Hail If pt is not established with a provider, would they like to be referred to a provider  to establish care? No .   Dental Screening: Recommended annual dental exams for proper oral hygiene  Community Resource Referral / Chronic Care Management: CRR required this visit?  No   CCM required this visit?  No      Plan:     I have personally reviewed and noted the following in the patient's chart:   Medical and social history Use of alcohol, tobacco or illicit drugs  Current medications and supplements including opioid prescriptions.  Functional ability and status Nutritional status Physical activity Advanced directives List of other physicians Hospitalizations, surgeries, and ER visits in previous 12 months Vitals Screenings to include cognitive, depression, and falls Referrals and appointments  In addition, I have reviewed and discussed with patient certain preventive protocols, quality metrics, and best practice recommendations. A written personalized care plan for preventive services as well as general preventive health recommendations were provided to patient.     Clista Bernhardt, [place]   06/05/2021   Nurse Notes:  Non-face to face 20 minute Encounter   Ms. Carreiro , Thank you for taking time to come for your Medicare Wellness Visit. I appreciate your ongoing commitment to your health goals. Please review the following plan we discussed and let me know  if I can assist you in the future.   These are the goals we discussed:  Goals      Patient Stated     Wants to lose a little weight to have more energy.        This is a list of the screening recommended for you and due dates:  Health Maintenance  Topic Date Due   Zoster (Shingles) Vaccine (1 of 2) Never done   COVID-19 Vaccine (4 - Booster) 10/24/2020   Flu Shot  04/12/2021   Tetanus Vaccine  07/10/2021*   Hepatitis C Screening: USPSTF Recommendation to screen - Ages 18-79 yo.  07/10/2021*   DEXA scan (bone density measurement)  Completed   HPV Vaccine  Aged Out  *Topic was postponed. The date shown  is not the original due date.

## 2021-06-09 DIAGNOSIS — Z0000 Encounter for general adult medical examination without abnormal findings: Secondary | ICD-10-CM | POA: Diagnosis not present

## 2021-06-25 DIAGNOSIS — G4733 Obstructive sleep apnea (adult) (pediatric): Secondary | ICD-10-CM | POA: Diagnosis not present

## 2021-07-13 ENCOUNTER — Telehealth (INDEPENDENT_AMBULATORY_CARE_PROVIDER_SITE_OTHER): Payer: Medicare Other | Admitting: Family Medicine

## 2021-07-13 ENCOUNTER — Encounter: Payer: Self-pay | Admitting: Family Medicine

## 2021-07-13 ENCOUNTER — Other Ambulatory Visit: Payer: Self-pay

## 2021-07-13 VITALS — Temp 98.0°F | Wt 189.0 lb

## 2021-07-13 DIAGNOSIS — U071 COVID-19: Secondary | ICD-10-CM

## 2021-07-13 MED ORDER — BENZONATATE 100 MG PO CAPS: 100.0000 mg | ORAL_CAPSULE | Freq: Three times a day (TID) | ORAL | 0 refills | Status: DC | PRN

## 2021-07-13 MED ORDER — NIRMATRELVIR/RITONAVIR (PAXLOVID)TABLET: 3.0000 | ORAL_TABLET | Freq: Two times a day (BID) | ORAL | 0 refills | Status: AC

## 2021-07-13 NOTE — Progress Notes (Signed)
Virtual Visit via Telephone Note  I connected with [name] on 07/13/21 at 10:00 AM EDT by telephone and verified that I am speaking with the correct person using two identifiers.   I discussed the limitations, risks, security and privacy concerns of performing an evaluation and management service by telephone and the availability of in person appointments. I also discussed with the patient that there may be a patient responsible charge related to this service. The patient expressed understanding and agreed to proceed.  Location patient: home, [place] Location provider: work or home office Participants present for the call: patient, provider Patient did not have a visit with me in the prior 7 days to address this/these issue(s).   History of Present Illness:  Acute telemedicine visit for Covid19: -Onset: 4 days ago -Symptoms include: nasal congestion, cough, low grade fever, some laryngitis, had mild diarrhea -Denies:CP, SOB, NV, inability to eat/drink/get out of bed -Has tried:tea and honey -Pertinent past medical history:see below -Pertinent medication allergies:  Allergies  Allergen Reactions   Lovastatin Other (See Comments)    myalgias   Sudafed [Pseudoephedrine Hcl] Other (See Comments)    Hallucinations   -COVID-19 vaccine status: 2 doses and 1 booster per patient Immunization History  Administered Date(s) Administered   Influenza Split 06/19/2014   Influenza, High Dose Seasonal PF 07/16/2018   Influenza-Unspecified 05/31/2015, 06/13/2016, 06/23/2020, 07/04/2021   PFIZER(Purple Top)SARS-COV-2 Vaccination 11/06/2019, 11/27/2019   Pneumococcal Conjugate-13 04/14/2017   Pneumococcal Polysaccharide-23 06/28/2009  -GFR was 65 in last 3 months  Past Medical History:  Diagnosis Date   Allergy    Anemia    in her 20's   Aortic insufficiency    mild by echo 10/2020   Arthritis    not dx'd- just per pt    Asthma    AS A CHILD   Cancer ([place])    endometrial cancer with  hysterectomy in 1987   Cancer ([place])    melanoma on face    Cataract    removed both eyes    DCM (dilated cardiomyopathy) ([place])    EF 50-55%   GERD (gastroesophageal reflux disease)    occ has with diet    History of syncope    HTN (hypertension)    Hyperlipidemia    OSA on CPAP    TIA (transient ischemic attack)       Observations/Objective: Patient sounds cheerful and well on the phone. I do not appreciate any SOB. Speech and thought processing are grossly intact. Patient reported vitals:  Assessment and Plan:  COVID-19   Discussed treatment options (infusions and oral options and risk of drug interactions), ideal treatment window, potential complications, isolation and precautions for COVID-19.  Discussed possibility of rebound with antivirals and the need to reisolate if it should occur for 5 days. Checked for/reviewed any labs done in the last 90 days with GFR listed in HPI if available. After lengthy discussion, the patient opted for treatment with Paxlovid due to being higher risk for complications of covid or severe disease and other factors. Discussed EUA status of this drug and the fact that there is preliminary limited knowledge of risks/interactions/side effects per EUA document vs possible benefits and precautions. This information was shared with patient during the visit and also was provided in patient instructions. Also, advised that patient discuss risks/interactions and use with pharmacist/treatment team as well.  The patient did want a prescription for cough, Tessalon Rx sent.  Other symptomatic care measures summarized in patient instructions. Advised to  seek prompt in person care if worsening, new symptoms arise, or if is not improving with treatment. Advised of options for inperson care in case PCP office not available. Did let the patient know that I only do telemedicine shifts for [facility] on Tuesdays and Thursdays and advised a follow up visit with PCP or at an [facility]  if has further questions or concerns.   Follow Up Instructions:  I did not refer this patient for an OV with me in the next 24 hours for this/these issue(s).  I discussed the assessment and treatment plan with the patient. The patient was provided an opportunity to ask questions and all were answered. The patient agreed with the plan and demonstrated an understanding of the instructions.   I spent  13 minutes on the date of this visit in the care of this patient. See summary of tasks completed to properly care for this patient in the detailed notes above which also included counseling of above, review of PMH, medications, allergies, evaluation of the patient and ordering and/or  instructing patient on testing and care options.     Kerry Kern, DO

## 2021-07-13 NOTE — Patient Instructions (Addendum)
HOME CARE TIPS:  -COVID19 testing information: [email or web address]  Most pharmacies also offer testing and home test kits. If the Covid19 test is positive and you desire antiviral treatment, please contact a [facility] or schedule a follow up virtual visit through your primary care office or through the [facility].  Other test to treat options: [email or web address]  -I sent the medication(s) we discussed to your pharmacy: Meds ordered this encounter  Medications   nirmatrelvir/ritonavir EUA (PAXLOVID) 20 x 150 MG & 10 x 100MG TABS    Sig: Take 3 tablets by mouth 2 (two) times daily for 5 days. (Take nirmatrelvir 150 mg two tablets twice daily for 5 days and ritonavir 100 mg one tablet twice daily for 5 days) Patient GFR is 65 in July    Dispense:  30 tablet    Refill:  0   benzonatate (TESSALON PERLES) 100 MG capsule    Sig: Take 1 capsule (100 mg total) by mouth 3 (three) times daily as needed.    Dispense:  20 capsule    Refill:  0     -I sent in the [facility] treatment or referral you requested per our discussion. Please see the information provided below and discuss further with the pharmacist/treatment team.  -If taking Paxlovid, please review all medications, supplement and over the counter drugs with your pharmacist and ask them to check for any interactions. Please make the following changes to your regular medications while taking Paxlovid: *atorvastatin (Lipitor)  -there is a chance of rebound illness after finishing your treatment. If you become sick again please isolate for an additional 5 days, plus 5 more days of masking.   -can use tylenol if needed for fevers, aches and pains per instructions  -can use nasal saline a few times per day if you have nasal congestion; sometimes  a short course of Afrin nasal spray for 3 days can help with symptoms as well  -stay hydrated, drink plenty of fluids and  eat small healthy meals - avoid dairy  -can take 1000 IU (78mg) Vit D3 and 100-500 mg of Vit C daily per instructions  -If the Covid test is positive, check out the [facility] for more information on home care, transmission and treatment for COVID19  -follow up with your doctor in 2-3 days unless improving and feeling better  -stay home while sick, except to seek medical care. If you have COVID19, ideally it would be best to stay home for a full 10 days since the onset of symptoms PLUS one day of no fever and feeling better. Wear a good mask that fits snugly (such as N95 or KN95) if around others to reduce the risk of transmission.  It was nice to meet you today, and I really hope you are feeling better soon. I help [facility] out with telemedicine visits on Tuesdays and Thursdays and am available for visits on those days. If you have any concerns or questions following this visit please schedule a follow up visit with your Primary Care doctor or seek care at a local urgent care clinic to avoid delays in care.    Seek in person care or schedule a follow up video visit promptly if your symptoms worsen, new concerns arise or you are not improving with treatment. Call 911 and/or seek emergency care if your symptoms are severe or life threatening.   FACT SHEET FOR PATIENTS, PARENTS, AND CAREGIVERS EMERGENCY USE AUTHORIZATION (EUA) OF PAXLOVID FOR CORONAVIRUS DISEASE 2019 (COVID-19) You are being given  this Fact Sheet because your healthcare provider believes it is necessary to provide you with PAXLOVID for the treatment of mild-to-moderate coronavirus disease (COVID-19) caused by the SARS-CoV-2 virus. This Fact Sheet contains information to help you understand the risks and benefits of taking the PAXLOVID you have received or may receive. The U.S. Food and Drug Administration (FDA) has issued an Emergency Use Authorization (EUA) to make PAXLOVID available during the COVID-19 pandemic  (for more details about an EUA please see "What is an Emergency Use Authorization?" at the end of this document). PAXLOVID is not an FDA-approved medicine in the [place]. Read this Fact Sheet for information about PAXLOVID. Talk to your healthcare provider about your options or if you have any questions. It is your choice to take PAXLOVID.  What is COVID-19? COVID-19 is caused by a virus called a coronavirus. You can get COVID-19 through close contact with another person who has the virus. COVID-19 illnesses have ranged from very mild-to-severe, including illness resulting in death. While information so far suggests that most COVID-19 illness is mild, serious illness can happen and may cause some of your other medical conditions to become worse. Older people and people of all ages with severe, long lasting (chronic) medical conditions like heart disease, lung disease, and diabetes, for example seem to be at higher risk of being hospitalized for COVID-19.  What is PAXLOVID? PAXLOVID is an investigational medicine used to treat mild-to-moderate COVID-19 in adults and children [38 years of age and older weighing at least 78 pounds (58 kg)] with positive results of direct SARS-CoV-2 viral testing, and who are at high risk for progression to severe COVID-19, including hospitalization or death. PAXLOVID is investigational because it is still being studied. There is limited information about the safety and effectiveness of using PAXLOVID to treat people with mild-to-moderate COVID-19.  The FDA has authorized the emergency use of PAXLOVID for the treatment of mild-tomoderate COVID-19 in adults and children [5 years of age and older weighing at least 10 pounds (68 kg)] with a positive test for the virus that causes COVID-19, and who are at high risk for progression to severe COVID-19, including hospitalization or death, under an EUA. 1 Revised: 27 November 2020   What should I tell my  healthcare provider before I take PAXLOVID? Tell your healthcare provider if you: ? Have any allergies ? Have liver or kidney disease ? Are pregnant or plan to become pregnant ? Are breastfeeding a child ? Have any serious illnesses  Tell your healthcare provider about all the medicines you take, including prescription and over-the-counter medicines, vitamins, and herbal supplements. Some medicines may interact with PAXLOVID and may cause serious side effects. Keep a list of your medicines to show your healthcare provider and pharmacist when you get a new medicine.  You can ask your healthcare provider or pharmacist for a list of medicines that interact with PAXLOVID. Do not start taking a new medicine without telling your healthcare provider. Your healthcare provider can tell you if it is safe to take PAXLOVID with other medicines.  Tell your healthcare provider if you are taking combined hormonal contraceptive. PAXLOVID may affect how your birth control pills work. Females who are able to become pregnant should use another effective alternative form of contraception or an additional barrier method of contraception. Talk to your healthcare provider if you have any questions about contraceptive methods that might be right for you.  How do I take PAXLOVID? ? PAXLOVID consists of 2 medicines:  nirmatrelvir and ritonavir. o Take 2 pink tablets of nirmatrelvir with 1 white tablet of ritonavir by mouth 2 times each day (in the morning and in the evening) for 5 days. For each dose, take all 3 tablets at the same time. o If you have kidney disease, talk to your healthcare provider. You may need a different dose. ? Swallow the tablets whole. Do not chew, break, or crush the tablets. ? Take PAXLOVID with or without food. ? Do not stop taking PAXLOVID without talking to your healthcare provider, even if you feel better. ? If you miss a dose of PAXLOVID within 8 hours of the time it is  usually taken, take it as soon as you remember. If you miss a dose by more than 8 hours, skip the missed dose and take the next dose at your regular time. Do not take 2 doses of PAXLOVID at the same time. ? If you take too much PAXLOVID, call your healthcare provider or go to the nearest hospital emergency room right away. ? If you are taking a ritonavir- or cobicistat-containing medicine to treat hepatitis C or Human Immunodeficiency Virus (HIV), you should continue to take your medicine as prescribed by your healthcare provider. 2 Revised: 27 November 2020    Talk to your healthcare provider if you do not feel better or if you feel worse after 5 days.  Who should generally not take PAXLOVID? Do not take PAXLOVID if: ? You are allergic to nirmatrelvir, ritonavir, or any of the ingredients in PAXLOVID. ? You are taking any of the following medicines: o Alfuzosin o Pethidine, propoxyphene o Ranolazine o Amiodarone, dronedarone, flecainide, propafenone, quinidine o Colchicine o Lurasidone, pimozide, clozapine o Dihydroergotamine, ergotamine, methylergonovine o Lovastatin, simvastatin o Sildenafil (Revatio) for pulmonary arterial hypertension (PAH) o Triazolam, oral midazolam o Apalutamide o Carbamazepine, phenobarbital, phenytoin o Rifampin o St. John's Wort (hypericum perforatum) Taking PAXLOVID with these medicines may cause serious or life-threatening side effects or affect how PAXLOVID works.  These are not the only medicines that may cause serious side effects if taken with PAXLOVID. PAXLOVID may increase or decrease the levels of multiple other medicines. It is very important to tell your healthcare provider about all of the medicines you are taking because additional laboratory tests or changes in the dose of your other medicines may be necessary while you are taking PAXLOVID. Your healthcare provider may also tell you about specific symptoms to watch out for that may  indicate that you need to stop or decrease the dose of some of your other medicines.  What are the important possible side effects of PAXLOVID? Possible side effects of PAXLOVID are: ? Allergic Reactions. Allergic reactions can happen in people taking PAXLOVID, even after only 1 dose. Stop taking PAXLOVID and call your healthcare provider right away if you get any of the following symptoms of an allergic reaction: o hives o trouble swallowing or breathing o swelling of the mouth, lips, or face o throat tightness o hoarseness 3 Revised: 27 November 2020  o skin rash ? Liver Problems. Tell your healthcare provider right away if you have any of these signs and symptoms of liver problems: loss of appetite, yellowing of your skin and the whites of eyes (jaundice), dark-colored urine, pale colored stools and itchy skin, stomach area (abdominal) pain. ? Resistance to HIV Medicines. If you have untreated HIV infection, PAXLOVID may lead to some HIV medicines not working as well in the future. ? Other possible side effects include:  o altered sense of taste o diarrhea o high blood pressure o muscle aches These are not all the possible side effects of PAXLOVID. Not many people have taken PAXLOVID. Serious and unexpected side effects may happen. PAXLOVID is still being studied, so it is possible that all of the risks are not known at this time.  What other treatment choices are there? Veklury (remdesivir) is FDA-approved for the treatment of mild-to-moderate DJTTS-17 in certain adults and children. Talk with your doctor to see if Marijean Heath is appropriate for you. Like PAXLOVID, FDA may also allow for the emergency use of other medicines to treat people with COVID-19. Go to https://price.info/ for information on the emergency use of other medicines that are authorized by FDA to treat people  with COVID-19. Your healthcare provider may talk with you about clinical trials for which you may be eligible. It is your choice to be treated or not to be treated with PAXLOVID. Should you decide not to receive it or for your child not to receive it, it will not change your standard medical care.  What if I am pregnant or breastfeeding? There is no experience treating pregnant women or breastfeeding mothers with PAXLOVID. For a mother and unborn baby, the benefit of taking PAXLOVID may be greater than the risk from the treatment. If you are pregnant, discuss your options and specific situation with your healthcare provider. It is recommended that you use effective barrier contraception or do not have sexual activity while taking PAXLOVID. If you are breastfeeding, discuss your options and specific situation with your healthcare provider. 4 Revised: 27 November 2020   How do I report side effects with PAXLOVID? Contact your healthcare provider if you have any side effects that bother you or do not go away. Report side effects to FDA MedWatch at SmoothHits.hu or call 1-800-FDA1088 or you can report side effects to [employer]. at the contact information provided below. Website Fax number Telephone number www.pfizersafetyreporting.com [phone] How should I store [place]? Store PAXLOVID tablets at room temperature, between 68?F to 77?F (20?C to 25?C). How can I learn more about COVID-19? ? Ask your healthcare provider. ? Visit [email or web address]://jacobson-johnson.[email or web address] ? Contact your local or state public health department. What is an Emergency Use Authorization (EUA)? The [place] FDA has made PAXLOVID available under an emergency access mechanism called an Emergency Use Authorization (EUA). The EUA is supported by a Education officer, museum and Human Service (HHS) declaration that circumstances exist to justify the emergency use of drugs and biological products during the  COVID-19 pandemic. PAXLOVID for the treatment of mild-to-moderate COVID-19 in adults and children [30 years of age and older weighing at least 27 pounds (26 kg)] with positive results of direct SARS-CoV-2 viral testing, and who are at high risk for progression to severe COVID-19, including hospitalization or death, has not undergone the same type of review as an FDA-approved product. In issuing an EUA under the LKTGY-56 public health emergency, the FDA has determined, among other things, that based on the total amount of scientific evidence available including data from adequate and well-controlled clinical trials, if available, it is reasonable to believe that the product may be effective for diagnosing, treating, or preventing COVID-19, or a serious or life-threatening disease or condition caused by COVID-19; that the known and potential benefits of the product, when used to diagnose, treat, or prevent such disease or condition, outweigh the known and potential risks of such product; and that there are no adequate, approved, and available  alternatives. All of these criteria must be met to allow for the product to be used in the treatment of patients during the COVID-19 pandemic. The EUA for PAXLOVID is in effect for the duration of the COVID-19 declaration justifying emergency use of this product, unless terminated or revoked (after which the products may no longer be used under the EUA). 5 Revised: 27 November 2020     Additional Information For general questions, visit the website or call the telephone number provided below. Website Telephone number www.COVID19oralRx.com ([phone] (1-877-C19-PACK) You can also go to www.pfizermedinfo.com or call [phone] for more information. [id number] Revised: 27 November 2020

## 2021-07-26 DIAGNOSIS — G4733 Obstructive sleep apnea (adult) (pediatric): Secondary | ICD-10-CM | POA: Diagnosis not present

## 2021-08-18 DIAGNOSIS — Z124 Encounter for screening for malignant neoplasm of cervix: Secondary | ICD-10-CM | POA: Diagnosis not present

## 2021-08-18 DIAGNOSIS — Z6835 Body mass index (BMI) 35.0-35.9, adult: Secondary | ICD-10-CM | POA: Diagnosis not present

## 2021-08-18 DIAGNOSIS — Z01419 Encounter for gynecological examination (general) (routine) without abnormal findings: Secondary | ICD-10-CM | POA: Diagnosis not present

## 2021-08-24 ENCOUNTER — Other Ambulatory Visit: Payer: Self-pay | Admitting: Cardiology

## 2021-08-25 DIAGNOSIS — G4733 Obstructive sleep apnea (adult) (pediatric): Secondary | ICD-10-CM | POA: Diagnosis not present

## 2021-08-30 NOTE — Progress Notes (Signed)
[facility] [address] [place]. [place] [address] [phone]       OFFICE FOLLOW UP NOTE  [name] Date of Birth:  Jul 25, 1941 Medical Record Number:  [id number]    [place] stroke provider: Dr. Leonie Man [facility] sleep provider: Dr. Rexene Alberts   Reason for visit: TIA and sleep apnea follow-up    CHIEF COMPLAINT:  Chief Complaint  Patient presents with   Obstructive Sleep Apnea    RM 3 alone Pt is well and stable, no new stroke concerns.  She sleeps well, hasn't been using CPAP much      HPI:   Update 08/31/2021 JM: Returns for 6-month TIA and CPAP follow-up unaccompanied  TIA: Overall stable without new or reoccurring stroke/TIA symptoms.  Compliant on aspirin and atorvastatin -denies side effects.  Blood pressure today 125/63.  Lipid panel 03/2021 LDL 68  CPAP:  She was refitted with a different nasal mask with extra foam which she has been tolerating better.  Had [place] in November - had difficulty tolerating at that time due to frequent coughing and congestion. She does still have residual increased fatigue and activity intolerance.  She also has year-round allergies with increased mucous production at times which makes it difficult to use CPAP at times. Otherwise she has been tolerating well and re-started using routinely this week. Noted high leak rate but she does not notice this being any issue.  Epworth Sleepiness Scale 7.             History provided for reference purposes only Update 03/04/2021 JM: Kerry [name] returns for 59-month TIA and CPAP follow-up.  TIA: Stable.  Denies new or reoccurring stroke/TIA symptoms.  Compliant on aspirin and atorvastatin without associated side effects.  Blood pressure today 138/75.   CPAP: She reports continued difficulty tolerating nasal nasal mask due to frequent leaks as well as seasonal allergies.  Order placed at prior visit for mask refitting but she reports she was not contacted.  She will use a neck  pillow to try to keep her head still as this will reduce mask leak.  She will also sometimes fall asleep in her recliner prior to placing mask on.  She has no other concerns today.         Update 08/17/2020 JM: Kerry [name] returns for 57-month TIA and CPAP follow-up.  Stable from stroke/TIA standpoint continuing on aspirin and atorvastatin without side effects.  Blood pressure today 138/68. Monitors at home and has been stable.  Reports running out of carvedilol and does not have cardiology follow-up until next week and has been taking metoprolol which was leftover from a prior prescription.  Compliance report from 07/14/2020 -08/12/2020 shows 22 out of 30 usage days for 73% compliance and 19 days greater than 4 hours for 63% compliance.  Average usage 5 hours and 42 minutes.  Residual AHI 4.8.  Pressure setting min pressure 5 and max pressure 11 with pressure in the 95th percentile 10.8.  Leaks in the 95 percentile 45.7.  She has been experiencing difficulty tolerating due to frequent mask leaks and difficulty getting her nasal mask to correctly fit despite recently getting new supplies.  Also experiencing increase congestion and sinus pressure. ESS 7.  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 no further concerns at this time.  Update 02/26/2020 JM: Kerry [name] is being seen for initial CPAP compliance visit with prior history of  TIA. Compliance report reviewed from 01/26/2020 -02/24/2020 showing 28 out of 30 usage days with 27 days greater than 4 hours for 90% compliance with average usage 6 hours and 59 minutes.  Residual AHI 2.1 with min pressure 5 cm H2O and max pressure of 11 cm H2O with EPR level 1.  Pressure in the 95th percentile 10.7.  Leaks in the 95th percentile 45.5 L/min. She does report mild improvement of daytime fatigue where she continues to work without difficulty and typically only becomes tired in the  evening.  ESS 6 (reading, watching TV and laying down in afternoon).  She does report waking up with dry mouth in the morning as well as difficulty with allergies and increased congestion.  Reports occasional leaks when laying on her side but will subside after mask adjustment. She continues to follow with [facility] for needed supplies and is currently up to date on supplies. Stable from TIA standpoint without new or reoccurring stroke/TIA symptoms.  Remains on aspirin and atorvastatin for secondary stroke prevention.  Blood pressure today 133/71.  Continues to follow routinely with PCP/cardiology for HTN and HLD management.  No stroke/TIA related concerns at this time.   Update 11/28/2019 JM:: Kerry [name] is a 80 year old female who is being seen today, 11/28/2019, for TIA follow-up.  She has been doing well from a stroke standpoint reoccurring stroke/TIA symptoms.  She was referred to [place] [facility] for suspected sleep apnea evaluated by Dr. Rexene Alberts and underwent sleep study on 10/13/2019 which did show mild sleep apnea with AHI 11.9/h and recommended initiation of AutoPap.  Per review of phone conversation on 10/24/2019, patient wanted to further consider use of AutoPap and at this time, is interested in pursuing.  Continues on aspirin and recently initiated atorvastatin by cardiology for secondary stroke prevention without side effects.  Blood pressure today satisfactory at 124/58.  No further concerns at this time.  Initial visit 07/30/2019 JM: Kerry [name] is a 80 year old female who is being seen today for hospital follow-up.  Has been stable since discharge without new or reoccurring stroke/TIA symptoms.  She has returned to work at [employer] without difficulty.  She does experience bilateral lower extremity pain and right shoulder pain which appears to be related to arthritis.  She does endorse daytime fatigue where she can easily lay down to take a nap or will fall asleep watching TV.  She does have to sit  upright in a recliner while sleeping.  At times she will feel increased shortness of breath with exertion and is unsure if this is related to her asthma.  She does occasionally experience visual aura migraines 2-3 times per year with longstanding history of migraines.  She also experiences occasional double left frontal pain typically occurring with increased anxiety or stress.  Completed 3 weeks DAPT and continues on aspirin alone without bleeding or bruising.  Continues on Zetia without side effects.  Blood pressure today 145/69.  Follow-up with cardiology scheduled on 08/06/2019 to discuss 30-day cardiac event monitor.  Has appointment with a different cardiologist in December with patient reporting to follow-up regarding CHF.  No further concerns at this time.   Stroke admission 06/20/2019: Kerry [name] is a 80 y.o. female with history of HTN, HLD, migraines, endometrial cancer s/p TAH 1987  presented on 06/20/2019 with transient slurred speech with word finding difficulty and right sided numbness.    Stroke work-up completed with symptoms likely related to left brain cortical TIA which appeared embolic in origin.  MRI no acute stroke with evidence of small vessel disease.  MRA negative for LVO with mild arthrosclerosis and bilateral maxillary sinus fluid levels.  Carotid Doppler bilateral ICA 1 to 39% stenosis in VAs antegrade.  2D echo EF of 45 to 50% with some diastolic function but no evidence of cardiac source of embolus..  Lower extremity venous Dopplers negative for DVT.  Recommended loop recorder placement to assess for atrial fibrillation but patient declined and was agreeable to do outpatient cardiac monitoring and recommend follow-up with cardiology outpatient to initiate.  LDL 91.  A1c 5.6.  Recommended DAPT for 3 weeks and aspirin alone.  HTN stable.  Initiated Zetia 10 mg daily with history of statin intolerance.  Other stroke risk factors include new combined systolic/diastolic CHF,  advanced age and migraines but no prior history of stroke.  Discharged home in stable condition without therapy needs.    ROS:   14 system review of systems performed and negative with exception of those listed in HPI  PMH:  Past Medical History:  Diagnosis Date   Allergy    Anemia    in her 20's   Aortic insufficiency    mild by echo 10/2020   Arthritis    not dx'd- just per pt    Asthma    AS A CHILD   Cancer ([place])    endometrial cancer with hysterectomy in 1987   Cancer ([place])    melanoma on face    Cataract    removed both eyes    DCM (dilated cardiomyopathy) ([place])    EF 50-55%   GERD (gastroesophageal reflux disease)    occ has with diet    History of syncope    HTN (hypertension)    Hyperlipidemia    OSA on CPAP    TIA (transient ischemic attack)     PSH:  Past Surgical History:  Procedure Laterality Date   ABDOMINAL HYSTERECTOMY     ANKLE FRACTURE SURGERY     APPENDECTOMY     CATARACT EXTRACTION, BILATERAL     CHOLECYSTECTOMY     COLONOSCOPY  08/01/2007   Dr Henrene Pastor    DENTAL SURGERY     ovary removal     right leg  fracture surgery       Social History:  Social History   Socioeconomic History   Marital status: Widowed    Spouse name: Not on file   Number of children: 2   Years of education: Not on file   Highest education level: Not on file  Occupational History   Not on file  Tobacco Use   Smoking status: Former    Types: Cigarettes   Smokeless tobacco: Never  Vaping Use   Vaping Use: Never used  Substance and Sexual Activity   Alcohol use: No   Drug use: No   Sexual activity: Not Currently  Other Topics Concern   Not on file  Social History Narrative   Lives at home alone   1 cup of coffee per day    Works at [employer] Strain: Not on file  Food Insecurity: Not on file  Transportation Needs: Not on file  Physical Activity: Not on file  Stress: Stress Concern Present    Feeling of Stress : To some extent  Social Connections: Moderately Isolated   Frequency of Communication with Friends and Family: More than three times a week   Frequency of Social Gatherings with  Friends and Family: More than three times a week   Attends Religious Services: More than 4 times per year   Active Member of Clubs or Organizations: No   Attends Archivist Meetings: Never   Marital Status: Widowed  Human resources officer Violence: Not on file    Family History:  Family History  Problem Relation Age of Onset   Mitral valve prolapse Daughter    Heart disease Mother    Kidney failure Mother    Diabetes Mother    Hyperlipidemia Mother    Hypertension Mother    Uterine cancer Mother    Dementia Father    Diabetes Sister    Multiple sclerosis Sister    Diabetes Paternal Grandmother    Throat cancer Brother        smoker   Colon cancer Neg Hx    Colon polyps Neg Hx    Esophageal cancer Neg Hx    Rectal cancer Neg Hx    Stomach cancer Neg Hx     Medications:   Current Outpatient Medications on File Prior to Visit  Medication Sig Dispense Refill   acetaminophen (TYLENOL) 500 MG tablet Take 500 mg by mouth every 6 (six) hours as needed for moderate pain or headache.      acetic acid-hydrocortisone (VOSOL-HC) OTIC solution Place 3 drops into both ears 3 (three) times daily. (Patient taking differently: Place 3 drops into both ears as needed.) 10 mL 0   albuterol (VENTOLIN HFA) 108 (90 Base) MCG/ACT inhaler Inhale 2 puffs into the lungs every 6 (six) hours as needed for wheezing or shortness of breath. (Patient taking differently: Inhale 2 puffs into the lungs as needed for wheezing or shortness of breath.) 1 Inhaler 0   Ascorbic Acid (VITAMIN C PO) Take 1 tablet by mouth daily.     aspirin 81 MG chewable tablet Chew 81 mg by mouth daily. Every other day per pt     atorvastatin (LIPITOR) 20 MG tablet TAKE 1 TABLET(20 MG) BY MOUTH DAILY 90 tablet 3   benzonatate (TESSALON  PERLES) 100 MG capsule Take 1 capsule (100 mg total) by mouth 3 (three) times daily as needed. 20 capsule 0   carvedilol (COREG) 12.5 MG tablet Take 1 tablet (12.5 mg total) by mouth 2 (two) times daily. 180 tablet 3   Cholecalciferol (VITAMIN D3) 2000 units capsule Take 2,000 Units by mouth daily.     Coenzyme Q10 (CO Q 10 PO) Take 1 capsule by mouth daily.     fexofenadine (ALLEGRA) 180 MG tablet Take 180 mg by mouth daily.     hydrochlorothiazide (HYDRODIURIL) 25 MG tablet Take 1 tablet (25 mg total) by mouth daily. Please keep upcoming appt in February 2023 with Dr. Radford Pax before anymore refills. Thank you Final Attempt 90 tablet 0   lisinopril (ZESTRIL) 20 MG tablet Take 1 tablet (20 mg total) by mouth daily. Please keep upcoming appt in February 2023 with Dr. Radford Pax before anymore refills. Thank you Final attempt 90 tablet 3   Misc Natural Products (NEURIVA PO) Take by mouth daily.     Multiple Vitamin (MULTIVITAMIN) tablet Take 1 tablet by mouth daily.     triamcinolone cream (KENALOG) 0.1 % Apply topically 2 (two) times daily. X 2 weeks     No current facility-administered medications on file prior to visit.    Allergies:   Allergies  Allergen Reactions   Lovastatin Other (See Comments)    myalgias   Sudafed [Pseudoephedrine Hcl] Other (See Comments)  Hallucinations      Physical Exam  Vitals:   08/31/21 0736  BP: 125/63  Pulse: 72  Weight: 194 lb (88 kg)  Height: 5\' 3"  (1.6 m)     Body mass index is 34.37 kg/m. No results found.  General: well developed, well nourished, very pleasant elderly Caucasian female, seated, in no evident distress Head: head normocephalic and atraumatic.   Neck: supple with no carotid or supraclavicular bruits Cardiovascular: regular rate and rhythm, no murmurs Musculoskeletal: Multiple bilateral hand arthritic nodules Skin:  no rash/petichiae Vascular:  Normal pulses all extremities   Neurologic Exam Mental Status: Awake and fully  alert.   Fluent speech and language. Oriented to place and time. Recent and remote memory intact. Attention span, concentration and fund of knowledge appropriate. Mood and affect appropriate.  Cranial Nerves: Pupils equal, briskly reactive to light. Extraocular movements full without nystagmus. Visual fields full to confrontation. Hearing intact. Facial sensation intact. Face, tongue, palate moves normally and symmetrically.  Motor: Normal bulk and tone. Normal strength in all tested extremity muscles. Sensory.: intact to touch , pinprick , position and vibratory sensation.  Coordination: Rapid alternating movements normal in all extremities. Finger-to-nose and heel-to-shin performed accurately bilaterally. Gait and Station: Arises from chair without difficulty. Stance is normal. Gait demonstrates normal stride length and balance without use of assistive device Reflexes: 1+ and symmetric. Toes downgoing.       ASSESSMENT/PLAN: Kerry [name] is a 80 y.o. year old female with underlying medical history of TIA 06/2019, CHF, HTN, HLD, migraines and OSA on CPAP.     OSA on CPAP: Low compliance noted in setting of COVID with frequent coughing 1 month ago - reports restarted routinely using this week discussed importance of increasing nightly usage greater than 4 hours nightly Continue current settings Order will be placed to [facility] for continued supplies   Hx of TIA:  Stable without new recurrent stroke/TIA symptoms  continue aspirin 81 mg daily  and atorvastatin for secondary stroke prevention.    Discussed secondary stroke prevention measures and importance of close PCP and maintain strict control of hypertension with blood pressure goal below 130/90 and cholesterol with LDL cholesterol (bad cholesterol) goal below 70 mg/dL.     Follow-up in 1 year or call earlier if needed   CC:  Libby Maw, MD   I spent 26 minutes of face-to-face and non-face-to-face  time with patient.  This included previsit chart review, lab review, order entry, electronic health record documentation, and patient education and discussion regarding review of CPAP compliance and importance of increasing compliance, prior history of TIA and managing stroke risk factors and answered all other questions to patient satisfaction  Frann Rider, AGNP-BC  [facility] [address] [place],  [address]  Phone [phone] Fax [phone] Note: This document was prepared with digital dictation and possible smart phrase technology. Any transcriptional errors that result from this process are unintentional.  I reviewed the above note and documentation by the Nurse Practitioner and agree with the history, exam, assessment and plan as outlined above. I was available for consultation. Star Age, MD, PhD [facility])

## 2021-08-31 ENCOUNTER — Ambulatory Visit: Payer: Medicare Other | Admitting: Adult Health

## 2021-08-31 ENCOUNTER — Encounter: Payer: Self-pay | Admitting: Adult Health

## 2021-08-31 VITALS — BP 125/63 | HR 72 | Ht 63.0 in | Wt 194.0 lb

## 2021-08-31 DIAGNOSIS — Z9989 Dependence on other enabling machines and devices: Secondary | ICD-10-CM | POA: Diagnosis not present

## 2021-08-31 DIAGNOSIS — G4733 Obstructive sleep apnea (adult) (pediatric): Secondary | ICD-10-CM

## 2021-08-31 DIAGNOSIS — G459 Transient cerebral ischemic attack, unspecified: Secondary | ICD-10-CM | POA: Diagnosis not present

## 2021-08-31 NOTE — Patient Instructions (Signed)
Importance of nightly CPAP compliance as sleep apnea management greatly reduces risk of recurrent stroke/TIA and cardiovascular disease  Continue aspirin 81 mg daily  and atorvastatin for secondary stroke prevention  Continue to follow up with PCP regarding blood pressure and cholesterol management  Maintain strict control of hypertension with blood pressure goal below 130/90, diabetes with hemoglobin A1c goal below 7.0 % and cholesterol with LDL cholesterol (bad cholesterol) goal below 70 mg/dL.   Signs of a Stroke? Follow the BEFAST method:  Balance Watch for a sudden loss of balance, trouble with coordination or vertigo Eyes Is there a sudden loss of vision in one or both eyes? Or double vision?  Face: Ask the person to smile. Does one side of the face droop or is it numb?  Arms: Ask the person to raise both arms. Does one arm drift downward? Is there weakness or numbness of a leg? Speech: Ask the person to repeat a simple phrase. Does the speech sound slurred/strange? Is the person confused ? Time: If you observe any of these signs, call 911.     Followup in the future with me in 1 year or call earlier if needed       Thank you for coming to see [place] at [facility]. I hope we have been able to provide you high quality care today.  You may receive a patient satisfaction survey over the next few weeks. We would appreciate your feedback and comments so that we may continue to improve ourselves and the health of our patients.

## 2021-09-03 ENCOUNTER — Other Ambulatory Visit: Payer: Self-pay | Admitting: Cardiology

## 2021-09-14 ENCOUNTER — Ambulatory Visit (INDEPENDENT_AMBULATORY_CARE_PROVIDER_SITE_OTHER): Payer: Medicare Other | Admitting: Family Medicine

## 2021-09-14 ENCOUNTER — Encounter: Payer: Self-pay | Admitting: Family Medicine

## 2021-09-14 ENCOUNTER — Other Ambulatory Visit: Payer: Self-pay

## 2021-09-14 VITALS — BP 140/72 | HR 70 | Temp 97.3°F | Ht 63.0 in | Wt 193.2 lb

## 2021-09-14 DIAGNOSIS — E559 Vitamin D deficiency, unspecified: Secondary | ICD-10-CM | POA: Diagnosis not present

## 2021-09-14 DIAGNOSIS — I10 Essential (primary) hypertension: Secondary | ICD-10-CM

## 2021-09-14 DIAGNOSIS — D649 Anemia, unspecified: Secondary | ICD-10-CM | POA: Insufficient documentation

## 2021-09-14 DIAGNOSIS — E7800 Pure hypercholesterolemia, unspecified: Secondary | ICD-10-CM | POA: Diagnosis not present

## 2021-09-14 LAB — BASIC METABOLIC PANEL
BUN: 19 mg/dL (ref 6–23)
CO2: 27 mEq/L (ref 19–32)
Calcium: 9.5 mg/dL (ref 8.4–10.5)
Chloride: 102 mEq/L (ref 96–112)
Creatinine, Ser: 0.8 mg/dL (ref 0.40–1.20)
GFR: 69.89 mL/min (ref >60.00)
Glucose, Bld: 96 mg/dL (ref 70–99)
Potassium: 4.8 mEq/L (ref 3.5–5.1)
Sodium: 136 mEq/L (ref 135–145)

## 2021-09-14 LAB — CBC
HCT: 34.6 % — ABNORMAL LOW (ref 36.0–46.0)
Hemoglobin: 11.5 g/dL — ABNORMAL LOW (ref 12.0–15.0)
MCHC: 33.2 g/dL (ref 30.0–36.0)
MCV: 92.6 fl (ref 78.0–100.0)
Platelets: 240 10*3/uL (ref 150.0–400.0)
RBC: 3.74 Mil/uL — ABNORMAL LOW (ref 3.87–5.11)
RDW: 13.3 % (ref 11.5–15.5)
WBC: 8 10*3/uL (ref 4.0–10.5)

## 2021-09-14 LAB — VITAMIN D 25 HYDROXY (VIT D DEFICIENCY, FRACTURES): VITD: 37.78 ng/mL (ref 30.00–100.00)

## 2021-09-14 LAB — VITAMIN B12: Vitamin B-12: 433 pg/mL (ref 211–911)

## 2021-09-14 NOTE — Progress Notes (Signed)
Established Patient Office Visit  Subjective:  Patient ID: Kerry [name], female    DOB: May 03, 1941  Age: 81 y.o. MRN: [id number]  CC:  Chief Complaint  Patient presents with   Follow-up    6 month f/u.  Fasting today. C/o having hair loss and fatigue after having Covid 06/2021.      HPI [place] presents for follow-up of hypertension, elevated cholesterol, anemia and vitamin D deficiency.  Continues with 2000 international units of vitamin D daily.  She is currently taking multivitamin.  Cholesterol has been controlled with atorvastatin 20 mg.  Continues with carvedilol HCTZ and lisinopril for blood pressure.  History of TIA and dilated cardiomyopathy.  She experienced 1 episode of palpitations that resolved spontaneously.  It was asymptomatic.  She took an aspirin.  No further episodes.  Follow-up appointment with cardiology is pending.  Past Medical History:  Diagnosis Date   Allergy    Anemia    in her 20's   Aortic insufficiency    mild by echo 10/2020   Arthritis    not dx'd- just per pt    Asthma    AS A CHILD   Cancer ([place])    endometrial cancer with hysterectomy in 1987   Cancer ([place])    melanoma on face    Cataract    removed both eyes    DCM (dilated cardiomyopathy) ([place])    EF 50-55%   GERD (gastroesophageal reflux disease)    occ has with diet    History of syncope    HTN (hypertension)    Hyperlipidemia    OSA on CPAP    TIA (transient ischemic attack)     Past Surgical History:  Procedure Laterality Date   ABDOMINAL HYSTERECTOMY     ANKLE FRACTURE SURGERY     APPENDECTOMY     CATARACT EXTRACTION, BILATERAL     CHOLECYSTECTOMY     COLONOSCOPY  08/01/2007   Dr Henrene Pastor    DENTAL SURGERY     ovary removal     right leg  fracture surgery       Family History  Problem Relation Age of Onset   Mitral valve prolapse Daughter    Heart disease Mother    Kidney failure Mother    Diabetes Mother    Hyperlipidemia Mother    Hypertension Mother     Uterine cancer Mother    Dementia Father    Diabetes Sister    Multiple sclerosis Sister    Diabetes Paternal Grandmother    Throat cancer Brother        smoker   Colon cancer Neg Hx    Colon polyps Neg Hx    Esophageal cancer Neg Hx    Rectal cancer Neg Hx    Stomach cancer Neg Hx     Social History   Socioeconomic History   Marital status: Widowed    Spouse name: Not on file   Number of children: 2   Years of education: Not on file   Highest education level: Not on file  Occupational History   Not on file  Tobacco Use   Smoking status: Former    Types: Cigarettes   Smokeless tobacco: Never  Vaping Use   Vaping Use: Never used  Substance and Sexual Activity   Alcohol use: No   Drug use: No   Sexual activity: Not Currently  Other Topics Concern   Not on file  Social History Narrative   Lives at home  alone   1 cup of coffee per day    Works at [employer] Strain: Not on file  Food Insecurity: Not on file  Transportation Needs: Not on file  Physical Activity: Not on file  Stress: Stress Concern Present   Feeling of Stress : To some extent  Social Connections: Moderately Isolated   Frequency of Communication with Friends and Family: More than three times a week   Frequency of Social Gatherings with Friends and Family: More than three times a week   Attends Religious Services: More than 4 times per year   Active Member of [employer] or Organizations: No   Attends Archivist Meetings: Never   Marital Status: Widowed  Intimate Partner Violence: Not on file    Outpatient Medications Prior to Visit  Medication Sig Dispense Refill   acetaminophen (TYLENOL) 500 MG tablet Take 500 mg by mouth every 6 (six) hours as needed for moderate pain or headache.      Ascorbic Acid (VITAMIN C PO) Take 1 tablet by mouth daily.     aspirin 81 MG chewable tablet Chew 81 mg by mouth daily. Every other day per pt      atorvastatin (LIPITOR) 20 MG tablet TAKE 1 TABLET(20 MG) BY MOUTH DAILY 90 tablet 3   carvedilol (COREG) 12.5 MG tablet TAKE 1 TABLET(12.5 MG) BY MOUTH TWICE DAILY 180 tablet 0   Cholecalciferol (VITAMIN D3) 2000 units capsule Take 2,000 Units by mouth daily.     Coenzyme Q10 (CO Q 10 PO) Take 1 capsule by mouth daily.     fexofenadine (ALLEGRA) 180 MG tablet Take 180 mg by mouth daily.     hydrochlorothiazide (HYDRODIURIL) 25 MG tablet Take 1 tablet (25 mg total) by mouth daily. Please keep upcoming appt in February 2023 with Dr. Radford Pax before anymore refills. Thank you Final Attempt 90 tablet 0   lisinopril (ZESTRIL) 20 MG tablet Take 1 tablet (20 mg total) by mouth daily. Please keep upcoming appt in February 2023 with Dr. Radford Pax before anymore refills. Thank you Final attempt 90 tablet 3   Misc Natural Products (NEURIVA PO) Take by mouth daily.     Multiple Vitamin (MULTIVITAMIN) tablet Take 1 tablet by mouth daily.     triamcinolone cream (KENALOG) 0.1 % Apply topically 2 (two) times daily. X 2 weeks     albuterol (VENTOLIN HFA) 108 (90 Base) MCG/ACT inhaler Inhale 2 puffs into the lungs every 6 (six) hours as needed for wheezing or shortness of breath. (Patient taking differently: Inhale 2 puffs into the lungs as needed for wheezing or shortness of breath.) 1 Inhaler 0   acetic acid-hydrocortisone (VOSOL-HC) OTIC solution Place 3 drops into both ears 3 (three) times daily. (Patient not taking: Reported on 09/14/2021) 10 mL 0   benzonatate (TESSALON PERLES) 100 MG capsule Take 1 capsule (100 mg total) by mouth 3 (three) times daily as needed. 20 capsule 0   No facility-administered medications prior to visit.    Allergies  Allergen Reactions   Lovastatin Other (See Comments)    myalgias   Sudafed [Pseudoephedrine Hcl] Other (See Comments)    Hallucinations     ROS Review of Systems  Constitutional:  Negative for chills, diaphoresis, fatigue, fever and unexpected weight change.   HENT: Negative.    Eyes:  Negative for photophobia and visual disturbance.  Respiratory:  Negative for cough and shortness of breath.   Cardiovascular:  Positive  for palpitations. Negative for chest pain.  Gastrointestinal: Negative.   Endocrine: Negative for polyphagia and polyuria.  Genitourinary: Negative.   Musculoskeletal:  Negative for gait problem and joint swelling.  Neurological:  Negative for speech difficulty and weakness.  Hematological:  Does not bruise/bleed easily.  Psychiatric/Behavioral: Negative.       Objective:    Physical Exam Vitals and nursing note reviewed.  Constitutional:      General: She is not in acute distress.    Appearance: Normal appearance. She is not ill-appearing, toxic-appearing or diaphoretic.  HENT:     Head: Normocephalic and atraumatic.     Right Ear: Tympanic membrane, ear canal and external ear normal.     Left Ear: Tympanic membrane, ear canal and external ear normal.     Mouth/Throat:     Mouth: Mucous membranes are moist.     Pharynx: Oropharynx is clear. No oropharyngeal exudate or posterior oropharyngeal erythema.  Eyes:     General: No scleral icterus.       Right eye: No discharge.        Left eye: No discharge.     Extraocular Movements: Extraocular movements intact.     Conjunctiva/sclera: Conjunctivae normal.     Pupils: Pupils are equal, round, and reactive to light.  Neck:     Vascular: No carotid bruit.  Cardiovascular:     Rate and Rhythm: Normal rate and regular rhythm.  Pulmonary:     Effort: Pulmonary effort is normal.     Breath sounds: Normal breath sounds.  Abdominal:     General: Bowel sounds are normal.  Musculoskeletal:     Cervical back: No rigidity or tenderness.     Right lower leg: No edema.     Left lower leg: No edema.  Lymphadenopathy:     Cervical: No cervical adenopathy.  Skin:    General: Skin is warm and dry.  Neurological:     Mental Status: She is alert and oriented to person, place,  and time.  Psychiatric:        Mood and Affect: Mood normal.        Behavior: Behavior normal.    BP 140/72    Pulse 70    Temp (!) 97.3 F (36.3 C) (Temporal)    Ht 5\' 3"  (1.6 m)    Wt 193 lb 3.2 oz (87.6 kg)    SpO2 97%    BMI 34.22 kg/m  Wt Readings from Last 3 Encounters:  09/14/21 193 lb 3.2 oz (87.6 kg)  08/31/21 194 lb (88 kg)  07/13/21 189 lb (85.7 kg)     Health Maintenance Due  Topic Date Due   Hepatitis C Screening  Never done   TETANUS/TDAP  Never done   Zoster Vaccines- Shingrix (1 of 2) Never done   COVID-19 Vaccine (4 - Booster) 09/26/2020    There are no preventive care reminders to display for this patient.  Lab Results  Component Value Date   TSH 2.33 03/12/2021   Lab Results  Component Value Date   WBC 6.9 03/12/2021   HGB 11.8 (L) 03/12/2021   HCT 35.3 (L) 03/12/2021   MCV 91.2 03/12/2021   PLT 217.0 03/12/2021   Lab Results  Component Value Date   NA 138 03/12/2021   K 4.8 03/12/2021   CO2 28 03/12/2021   GLUCOSE 86 03/12/2021   BUN 26 (H) 03/12/2021   CREATININE 0.85 03/12/2021   BILITOT 0.7 03/12/2021   ALKPHOS 90 03/12/2021  AST 18 03/12/2021   ALT 14 03/12/2021   PROT 7.1 03/12/2021   ALBUMIN 4.0 03/12/2021   CALCIUM 9.7 03/12/2021   ANIONGAP 11 06/21/2019   GFR 65.22 03/12/2021   Lab Results  Component Value Date   CHOL 139 03/12/2021   Lab Results  Component Value Date   HDL 58.90 03/12/2021   Lab Results  Component Value Date   LDLCALC 68 03/12/2021   Lab Results  Component Value Date   TRIG 61.0 03/12/2021   Lab Results  Component Value Date   CHOLHDL 2 03/12/2021   Lab Results  Component Value Date   HGBA1C 5.6 06/21/2019      Assessment & Plan:   Problem List Items Addressed This Visit       Cardiovascular and Mediastinum   Essential hypertension - Primary   Relevant Orders   CBC   Basic metabolic panel     Other   Hypercholesterolemia   Relevant Orders   Lipid panel   Vitamin D deficiency    Relevant Orders   VITAMIN D 25 Hydroxy (Vit-D Deficiency, Fractures)   Anemia   Relevant Orders   CBC   Vitamin B12   Iron, TIBC and Ferritin Panel    No orders of the defined types were placed in this encounter.   Follow-up: Return in about 6 months (around 03/14/2022), or if symptoms worsen or fail to improve.    Libby Maw, MD

## 2021-09-15 LAB — IRON,TIBC AND FERRITIN PANEL
%SAT: 27 % (calc) (ref 16–45)
Ferritin: 93 ng/mL (ref 16–288)
Iron: 85 ug/dL (ref 45–160)
TIBC: 319 mcg/dL (calc) (ref 250–450)

## 2021-09-20 NOTE — Addendum Note (Signed)
Addended by: Jon Billings on: 09/20/2021 10:24 AM   Modules accepted: Orders

## 2021-09-23 ENCOUNTER — Other Ambulatory Visit (INDEPENDENT_AMBULATORY_CARE_PROVIDER_SITE_OTHER): Payer: Medicare Other

## 2021-09-23 ENCOUNTER — Other Ambulatory Visit: Payer: Self-pay

## 2021-09-23 DIAGNOSIS — E7800 Pure hypercholesterolemia, unspecified: Secondary | ICD-10-CM | POA: Diagnosis not present

## 2021-09-23 LAB — LIPID PANEL
Cholesterol: 141 mg/dL (ref 0–200)
HDL: 54.2 mg/dL (ref >39.00)
LDL Cholesterol: 69 mg/dL (ref 0–99)
NonHDL: 87.21
Total CHOL/HDL Ratio: 3
Triglycerides: 92 mg/dL (ref 0.0–149.0)
VLDL: 18.4 mg/dL (ref 0.0–40.0)

## 2021-09-23 LAB — LDL CHOLESTEROL, DIRECT: Direct LDL: 64 mg/dL

## 2021-09-24 ENCOUNTER — Other Ambulatory Visit: Payer: Self-pay

## 2021-09-24 ENCOUNTER — Encounter (HOSPITAL_COMMUNITY): Payer: Self-pay

## 2021-09-24 ENCOUNTER — Inpatient Hospital Stay (HOSPITAL_COMMUNITY)
Admission: EM | Admit: 2021-09-24 | Discharge: 2021-09-27 | DRG: 083 | Disposition: A | Discharge status: Home / Self Care | Payer: Medicare Other | Attending: Internal Medicine | Admitting: Internal Medicine

## 2021-09-24 ENCOUNTER — Emergency Department (HOSPITAL_COMMUNITY): Payer: Medicare Other

## 2021-09-24 DIAGNOSIS — E669 Obesity, unspecified: Secondary | ICD-10-CM | POA: Diagnosis not present

## 2021-09-24 DIAGNOSIS — Z8049 Family history of malignant neoplasm of other genital organs: Secondary | ICD-10-CM | POA: Diagnosis not present

## 2021-09-24 DIAGNOSIS — S20219A Contusion of unspecified front wall of thorax, initial encounter: Secondary | ICD-10-CM | POA: Diagnosis not present

## 2021-09-24 DIAGNOSIS — G9340 Encephalopathy, unspecified: Secondary | ICD-10-CM | POA: Diagnosis not present

## 2021-09-24 DIAGNOSIS — R29818 Other symptoms and signs involving the nervous system: Secondary | ICD-10-CM | POA: Diagnosis not present

## 2021-09-24 DIAGNOSIS — Z8249 Family history of ischemic heart disease and other diseases of the circulatory system: Secondary | ICD-10-CM | POA: Diagnosis not present

## 2021-09-24 DIAGNOSIS — Z20822 Contact with and (suspected) exposure to covid-19: Secondary | ICD-10-CM | POA: Diagnosis present

## 2021-09-24 DIAGNOSIS — Z79899 Other long term (current) drug therapy: Secondary | ICD-10-CM | POA: Diagnosis not present

## 2021-09-24 DIAGNOSIS — Z8673 Personal history of transient ischemic attack (TIA), and cerebral infarction without residual deficits: Secondary | ICD-10-CM

## 2021-09-24 DIAGNOSIS — I10 Essential (primary) hypertension: Secondary | ICD-10-CM | POA: Diagnosis present

## 2021-09-24 DIAGNOSIS — W1830XA Fall on same level, unspecified, initial encounter: Secondary | ICD-10-CM | POA: Diagnosis not present

## 2021-09-24 DIAGNOSIS — E785 Hyperlipidemia, unspecified: Secondary | ICD-10-CM | POA: Diagnosis not present

## 2021-09-24 DIAGNOSIS — Z7982 Long term (current) use of aspirin: Secondary | ICD-10-CM | POA: Diagnosis not present

## 2021-09-24 DIAGNOSIS — Z833 Family history of diabetes mellitus: Secondary | ICD-10-CM | POA: Diagnosis not present

## 2021-09-24 DIAGNOSIS — Z808 Family history of malignant neoplasm of other organs or systems: Secondary | ICD-10-CM | POA: Diagnosis not present

## 2021-09-24 DIAGNOSIS — R4182 Altered mental status, unspecified: Secondary | ICD-10-CM | POA: Diagnosis not present

## 2021-09-24 DIAGNOSIS — R0789 Other chest pain: Secondary | ICD-10-CM | POA: Diagnosis not present

## 2021-09-24 DIAGNOSIS — Z85820 Personal history of malignant melanoma of skin: Secondary | ICD-10-CM | POA: Diagnosis not present

## 2021-09-24 DIAGNOSIS — Z90710 Acquired absence of both cervix and uterus: Secondary | ICD-10-CM

## 2021-09-24 DIAGNOSIS — Z6834 Body mass index (BMI) 34.0-34.9, adult: Secondary | ICD-10-CM | POA: Diagnosis not present

## 2021-09-24 DIAGNOSIS — K219 Gastro-esophageal reflux disease without esophagitis: Secondary | ICD-10-CM | POA: Diagnosis not present

## 2021-09-24 DIAGNOSIS — I420 Dilated cardiomyopathy: Secondary | ICD-10-CM | POA: Diagnosis not present

## 2021-09-24 DIAGNOSIS — S0003XA Contusion of scalp, initial encounter: Secondary | ICD-10-CM | POA: Diagnosis not present

## 2021-09-24 DIAGNOSIS — S065XAA Traumatic subdural hemorrhage with loss of consciousness status unknown, initial encounter: Principal | ICD-10-CM | POA: Diagnosis present

## 2021-09-24 DIAGNOSIS — Y92008 Other place in unspecified non-institutional (private) residence as the place of occurrence of the external cause: Secondary | ICD-10-CM

## 2021-09-24 DIAGNOSIS — S065X0A Traumatic subdural hemorrhage without loss of consciousness, initial encounter: Secondary | ICD-10-CM | POA: Diagnosis not present

## 2021-09-24 DIAGNOSIS — R410 Disorientation, unspecified: Secondary | ICD-10-CM | POA: Diagnosis not present

## 2021-09-24 DIAGNOSIS — R079 Chest pain, unspecified: Secondary | ICD-10-CM | POA: Diagnosis not present

## 2021-09-24 LAB — COMPREHENSIVE METABOLIC PANEL
ALT: 21 U/L (ref 0–44)
AST: 24 U/L (ref 15–41)
Albumin: 3.7 g/dL (ref 3.5–5.0)
Alkaline Phosphatase: 98 U/L (ref 38–126)
Anion gap: 7 (ref 5–15)
BUN: 23 mg/dL (ref 8–23)
CO2: 24 mmol/L (ref 22–32)
Calcium: 9.2 mg/dL (ref 8.9–10.3)
Chloride: 105 mmol/L (ref 98–111)
Creatinine, Ser: 0.81 mg/dL (ref 0.44–1.00)
GFR, Estimated: >60 mL/min (ref >60)
Glucose, Bld: 112 mg/dL — ABNORMAL HIGH (ref 70–99)
Potassium: 4.3 mmol/L (ref 3.5–5.1)
Sodium: 136 mmol/L (ref 135–145)
Total Bilirubin: 0.6 mg/dL (ref 0.3–1.2)
Total Protein: 7.3 g/dL (ref 6.5–8.1)

## 2021-09-24 LAB — URINALYSIS, ROUTINE W REFLEX MICROSCOPIC
Bilirubin Urine: NEGATIVE
Glucose, UA: NEGATIVE mg/dL
Hgb urine dipstick: NEGATIVE
Ketones, ur: NEGATIVE mg/dL
Leukocytes,Ua: NEGATIVE
Nitrite: NEGATIVE
Protein, ur: NEGATIVE mg/dL
Specific Gravity, Urine: 1.017 (ref 1.005–1.030)
pH: 5 (ref 5.0–8.0)

## 2021-09-24 LAB — CBC
HCT: 35.7 % — ABNORMAL LOW (ref 36.0–46.0)
Hemoglobin: 11.9 g/dL — ABNORMAL LOW (ref 12.0–15.0)
MCH: 31.5 pg (ref 26.0–34.0)
MCHC: 33.3 g/dL (ref 30.0–36.0)
MCV: 94.4 fL (ref 80.0–100.0)
Platelets: 225 10*3/uL (ref 150–400)
RBC: 3.78 MIL/uL — ABNORMAL LOW (ref 3.87–5.11)
RDW: 12.5 % (ref 11.5–15.5)
WBC: 12.5 10*3/uL — ABNORMAL HIGH (ref 4.0–10.5)
nRBC: 0 % (ref 0.0–0.2)

## 2021-09-24 LAB — BLOOD GAS, VENOUS
Acid-Base Excess: 0.5 mmol/L (ref 0.0–2.0)
Bicarbonate: 25.7 mmol/L (ref 20.0–28.0)
O2 Saturation: 62.1 %
Patient temperature: 98.6
pCO2, Ven: 46.1 mmHg (ref 44.0–60.0)
pH, Ven: 7.366 (ref 7.250–7.430)
pO2, Ven: 36.7 mmHg (ref 32.0–45.0)

## 2021-09-24 LAB — TROPONIN I (HIGH SENSITIVITY)
Troponin I (High Sensitivity): 9 ng/L (ref <18)
Troponin I (High Sensitivity): 9 ng/L (ref <18)

## 2021-09-24 MED ORDER — LORAZEPAM 2 MG/ML IJ SOLN
0.5000 mg | Freq: Once | INTRAMUSCULAR | Status: AC
  Administered 2021-09-24: 0.5 mg via INTRAVENOUS
  Filled 2021-09-24: qty 1

## 2021-09-24 NOTE — ED Provider Triage Note (Signed)
Emergency Medicine Provider Triage Evaluation Note  Kerry [name] , a 81 y.o. female  was evaluated in triage.  Pt complains of chest tightness. Brought in by daughter who last spoke with patient on Wednesday and states she was at baseline at that time. Patient called her daughter today and reported a headache and chest pain and did not sound like herself. On arrival at patient's house, patient denied any complaints to patient and did not recall calling her. States she feels like her bra is too tight currently. Rep[orts a dull right frontal headache that is not particularly bothersome at this time. Denies SHOB, nausea, diaphoresis or any other complaints. Is alert to person and place, does not know day of the week or year. Denies recent falls  Review of Systems  Positive: Chest tightness, headache Negative: SHOB  Physical Exam  Ht 5\' 3"  (1.6 m)    Wt 87.6 kg    BMI 34.22 kg/m  Gen:   Awake, no distress   Resp:  Normal effort  MSK:   Moves extremities without difficulty  Other:  HR RRR, no unilateral weakness, facial movements symmetrical, no drift, no numbness   Medical Decision Making  Medically screening exam initiated at 6:52 PM.  Appropriate orders placed.  [place] F Bento was informed that the remainder of the evaluation will be completed by another provider, this initial triage assessment does not replace that evaluation, and the importance of remaining in the ED until their evaluation is complete.     Tacy Learn, PA-C 09/24/21 1856

## 2021-09-24 NOTE — Plan of Care (Signed)
Call from Dr. Eulis Foster at [facility] ER Patient with small subdural on MRI-not seen on CT with complaints of confusion. Concerned that size of subdural not big enough to explain the confusion. Recommend transfer to [facility] for observation and EEG. If the EEG shows abnormality, please reengage neurology service for full consultation.

## 2021-09-24 NOTE — ED Triage Notes (Addendum)
Patient's daughter reports that the patient called her today c/o chest pain and a headache. Call was made at 1715. Patient's daughter states she has not talked with her mother in 2 days. When patient's daughter arrived the patient could not remember calling her daughter, repeating questions like, What is today? Etc.  Patient denied falling. Patient unable to name the President or year.

## 2021-09-24 NOTE — ED Provider Notes (Signed)
[facility] DEPT Provider Note   CSN: [id number] Arrival date & time: 09/24/21  1754     History  Chief Complaint  Patient presents with   Chest Pain    Kerry [name] is a 81 y.o. female.  HPI Elderly female presenting today with family member for concern of chest pain, and confusion.  She is also reported headache today.  She has history of sleep apnea, uses CPAP.  She is here with her daughter who states that the patient called her around 5 PM tonight and complained of headache and "chest tightness."  Patient's dates that since that time, her chest discomfort has resolved.  Patient had a similar episode of confusion about 20 years ago when she was diagnosed with memory loss from "transient ischemic attack."  She has not had any intervening TIAs or CVAs.  She has a history of aortic insufficiency and grade 1 diastolic dysfunction but no other known cardiac disorders.  She had lipid testing done yesterday, by her PCP.  There have been no known falls or trauma to the head.    Home Medications Prior to Admission medications   Medication Sig Start Date End Date Taking? Authorizing Provider  acetaminophen (TYLENOL) 500 MG tablet Take 500 mg by mouth every 6 (six) hours as needed for moderate pain or headache.     [provider]  Ascorbic Acid (VITAMIN C PO) Take 1 tablet by mouth daily.    [provider]  aspirin 81 MG chewable tablet Chew 81 mg by mouth daily. Every other day per pt    [provider]  atorvastatin (LIPITOR) 20 MG tablet TAKE 1 TABLET(20 MG) BY MOUTH DAILY 10/15/20   Sueanne Margarita, MD  carvedilol (COREG) 12.5 MG tablet TAKE 1 TABLET(12.5 MG) BY MOUTH TWICE DAILY 09/03/21   Sueanne Margarita, MD  Cholecalciferol (VITAMIN D3) 2000 units capsule Take 2,000 Units by mouth daily.    [provider]  Coenzyme Q10 (CO Q 10 PO) Take 1 capsule by mouth daily.    [provider]  fexofenadine (ALLEGRA) 180  MG tablet Take 180 mg by mouth daily.    [provider]  hydrochlorothiazide (HYDRODIURIL) 25 MG tablet Take 1 tablet (25 mg total) by mouth daily. Please keep upcoming appt in February 2023 with Dr. Radford Pax before anymore refills. Thank you Final Attempt 08/24/21   Sueanne Margarita, MD  lisinopril (ZESTRIL) 20 MG tablet Take 1 tablet (20 mg total) by mouth daily. Please keep upcoming appt in February 2023 with Dr. Radford Pax before anymore refills. Thank you Final attempt 08/24/21   Sueanne Margarita, MD  Misc Natural Products (NEURIVA PO) Take by mouth daily.    [provider]  Multiple Vitamin (MULTIVITAMIN) tablet Take 1 tablet by mouth daily.    [provider]  triamcinolone cream (KENALOG) 0.1 % Apply topically 2 (two) times daily. X 2 weeks 02/10/21   [provider]      Allergies    Lovastatin and Sudafed [pseudoephedrine hcl]    Review of Systems   Review of Systems  All other systems reviewed and are negative.  Physical Exam Updated Vital Signs BP (!) 115/45    Pulse 83    Resp 16    Ht 5\' 3"  (1.6 m)    Wt 87.6 kg    SpO2 96%    BMI 34.22 kg/m  Physical Exam Vitals and nursing note reviewed.  Constitutional:  Appearance: She is well-developed. She is not ill-appearing.  HENT:     Head: Normocephalic.     Comments: Right frontal scalp with small abrasion, and probable contusion (about 2.5 cm), minimal bleeding associated.    Right Ear: External ear normal.     Left Ear: External ear normal.     Nose: No congestion or rhinorrhea.  Eyes:     Conjunctiva/sclera: Conjunctivae normal.     Pupils: Pupils are equal, round, and reactive to light.  Neck:     Trachea: Phonation normal.  Cardiovascular:     Rate and Rhythm: Normal rate and regular rhythm.     Heart sounds: Normal heart sounds.  Pulmonary:     Effort: Pulmonary effort is normal. No respiratory distress.     Breath sounds: Normal breath sounds. No stridor.  Chest:     Chest wall:  No tenderness.  Abdominal:     General: There is no distension.     Palpations: Abdomen is soft.     Tenderness: There is no abdominal tenderness.  Musculoskeletal:        General: Normal range of motion.     Cervical back: Normal range of motion and neck supple.  Skin:    General: Skin is warm and dry.  Neurological:     Mental Status: She is alert and oriented to person, place, and time.     Cranial Nerves: No cranial nerve deficit.     Sensory: No sensory deficit.     Motor: No abnormal muscle tone.     Coordination: Coordination normal.     Comments: Memory somewhat limited.  No dysarthria, aphasia or nystagmus.  No pronator drift.  Psychiatric:        Mood and Affect: Mood normal.        Behavior: Behavior normal.    ED Results / Procedures / Treatments   Labs (all labs ordered are listed, but only abnormal results are displayed) Labs Reviewed  CBC - Abnormal; Notable for the following components:      Result Value   WBC 12.5 (*)    RBC 3.78 (*)    Hemoglobin 11.9 (*)    HCT 35.7 (*)    All other components within normal limits  COMPREHENSIVE METABOLIC PANEL - Abnormal; Notable for the following components:   Glucose, Bld 112 (*)    All other components within normal limits  RESP PANEL BY RT-PCR (FLU A&B, COVID) ARPGX2  URINALYSIS, ROUTINE W REFLEX MICROSCOPIC  BLOOD GAS, VENOUS  TROPONIN I (HIGH SENSITIVITY)  TROPONIN I (HIGH SENSITIVITY)    EKG EKG Interpretation  Date/Time:  Friday September 24 2021 18:50:52 EST Ventricular Rate:  87 PR Interval:  213 QRS Duration: 125 QT Interval:  374 QTC Calculation: 450 R Axis:   -60 Text Interpretation: Sinus rhythm Borderline prolonged PR interval Nonspecific IVCD with LAD Left ventricular hypertrophy ST elevation, consider inferior injury Artifact Since last tracing No significant change was found Confirmed by Daleen Bo [phone]) on 09/24/2021 6:56:47 PM  Radiology CT Head Wo Contrast  Result Date:  09/24/2021 CLINICAL DATA:  Mental status change, unknown cause EXAM: CT HEAD WITHOUT CONTRAST TECHNIQUE: Contiguous axial images were obtained from the base of the skull through the vertex without intravenous contrast. RADIATION DOSE REDUCTION: This exam was performed according to the departmental dose-optimization program which includes automated exposure control, adjustment of the mA and/or kV according to patient size and/or use of iterative reconstruction technique. COMPARISON:  06/20/2019 FINDINGS: Brain: No evidence  of acute infarction, hemorrhage, cerebral edema, mass, mass effect, or midline shift. No hydrocephalus or extra-axial fluid collection. Vascular: No hyperdense vessel. Skull: Normal. Negative for fracture or focal lesion. Sinuses/Orbits: No acute finding. Other: Trace fluid in right mastoid air cells. Small right frontal scalp hematoma. IMPRESSION: IMPRESSION No acute intracranial process. No etiology is seen for the patient's altered mental status. Electronically Signed   By: Merilyn Baba M.D.   On: 09/24/2021 19:23   MR BRAIN WO CONTRAST  Result Date: 09/24/2021 CLINICAL DATA:  Acute neurologic deficits EXAM: MRI HEAD WITHOUT CONTRAST TECHNIQUE: Multiplanar, multiecho pulse sequences of the brain and surrounding structures were obtained without intravenous contrast. COMPARISON:  06/20/2019 FINDINGS: Brain: There is a small right hemispheric subdural hematoma measuring 3 mm. No mass effect or midline shift. No acute infarct. There is multifocal hyperintense T2-weighted signal within the white matter. Parenchymal volume and CSF spaces are normal. The midline structures are normal. Old, punctate left cerebellar infarct. Vascular: Major flow voids are preserved. Skull and upper cervical spine: Right scalp subgaleal hematoma. Sinuses/Orbits:No paranasal sinus fluid levels or advanced mucosal thickening. No mastoid or middle ear effusion. Normal orbits. IMPRESSION: 1. Small right hemispheric  subdural hematoma measuring 3 mm. No mass effect or midline shift. 2. Findings of chronic small vessel ischemia. 3. Right scalp hematoma. Critical Value/emergent results were called by telephone at the time of interpretation on 09/24/2021 at 10:57 pm to provider [facility] , who verbally acknowledged these results. Electronically Signed   By: Ulyses Jarred M.D.   On: 09/24/2021 22:57   DG Chest Port 1 View  Result Date: 09/24/2021 CLINICAL DATA:  Chest pain and confusion for 1 day EXAM: PORTABLE CHEST 1 VIEW COMPARISON:  02/22/2018 FINDINGS: Heart is at the upper limits of normal. The lungs are clear bilaterally. No focal infiltrate or effusion is seen. No bony abnormality is noted. IMPRESSION: No acute abnormality noted. Electronically Signed   By: Inez Catalina M.D.   On: 09/24/2021 19:28    Procedures .Critical Care Performed by: Daleen Bo, MD Authorized by: Daleen Bo, MD   Critical care provider statement:    Critical care time (minutes):  45   Critical care start time:  09/24/2021 7:00 PM   Critical care end time:  09/24/2021 11:22 PM   Critical care time was exclusive of:  Separately billable procedures and treating other patients   Critical care was necessary to treat or prevent imminent or life-threatening deterioration of the following conditions:  CNS failure or compromise   Critical care was time spent personally by me on the following activities:  Blood draw for specimens, development of treatment plan with patient or surrogate, discussions with consultants, evaluation of patient's response to treatment, examination of patient, ordering and performing treatments and interventions, ordering and review of laboratory studies, ordering and review of radiographic studies, pulse oximetry, re-evaluation of patient's condition and review of old charts    Medications Ordered in ED Medications  LORazepam (ATIVAN) injection 0.5 mg (0.5 mg Intravenous Given 09/24/21 2141)    ED  Course/ Medical Decision Making/ A&P Clinical Course as of 09/24/21 2324  Fri Sep 24, 2021  2306 At this time the patient's daughter says the patient is somewhat less confused, and is now able to remember the day of the week she could not recall earlier. [EW]    Clinical Course User Index [EW] Daleen Bo, MD  Medical Decision Making   Patient Vitals for the past 24 hrs:  BP Pulse Resp SpO2 Height Weight  09/24/21 2242 (!) 115/45 83 16 96 % -- --  09/24/21 2130 (!) 121/54 84 15 95 % -- --  09/24/21 2100 (!) 116/52 84 15 95 % -- --  09/24/21 2030 (!) 126/56 79 19 95 % -- --  09/24/21 2000 (!) 148/60 83 (!) 22 96 % -- --  09/24/21 1851 -- -- -- -- 5\' 3"  (1.6 m) 87.6 kg    11:17 PM Reevaluation with update and discussion with patient and daughter. After initial assessment and treatment, an updated evaluation reveals she continues to be mildly confused for recent events. Illness risk, increased bleeding, and seizures, discussed. Daleen Bo    Medical Decision Making: Summary of Illness/Injury: Patient presenting for combination of confusion and chest pain.  Chest pain resolved spontaneously, confusion has improved but is persistent.  She is having trouble remembering dates and all of her medical history.  Patient's daughter reports transient difficulty speaking, the nature of, finding words.  That has improved but the patient still has memory loss.  Critical Interventions-clinical evaluation, laboratory testing, radiography, observation and reassessment; to evaluate  Chief Complaint  Patient presents with   Chest Pain    and assess for illness characterized as   , Acute, Previously Undiagnosed, Uncertain Prognosis, Complicated, and Systemic Symptoms   The Differential Diagnoses include CVA, ACS, acute infection, complications from chronic respiratory illness, metabolic disorders and progressive CNS disorder.  I obtained  Additional Historical Information  from [place] daughter at the bedside , as the patient is a poor historian.  I decided to review pertinent External Data, and in summary PCP evaluation in the office on 09/17/2021.  At that time she was stable.  She was continued on her usual medicines.  Basic labs from 09/14/2021, and yesterday reviewed.   Clinical Laboratory Tests Ordered, included CBC, Metabolic panel, Urinalysis, and troponin, venous blood gas . Review indicates normal except Vicon high, hemoglobin low, glucose high. Emergent testing abnormality management required for stabilization-no  Radiologic Tests Ordered, included CT head, chest x-ray.  I independently Visualized: Radiograph images, which show no acute abnormalities  Cardiac Monitor Tracing which shows normal sinus rhythm, indicating stable cardiac rhythm    This patient is Presenting for Evaluation of confusion and headache, which does require a range of treatment options, and is a complaint that involves a high risk of morbidity and mortality.  Pharmaceutical Risk Management Ativan for sedation to get MRI Parenteral Treatment   Treatment Complication Risk Evaluation indicates appropriate disposition isHospitalization she requires observation for acute mental status changes and EEG imaging to ensure no seizure foci.  After These Interventions, the Patient was reevaluated and was found with likely traumatic injury causing subdural.  Patient does not have a history of fall however presents with confusion and lives alone.  Head CT does not indicate bleeding, infarction or tumor.  Blood work including venous blood gas is near normal.  The white count and hemoglobin abnormalities are nonspecific.  Doubt pneumonia and UTI.  Advanced imaging with MRI indicates a small right-sided subdural hematoma and chronic microvascular disease.  Telephone consultation with neuro hospitalist, Dr. Lucky Rathke, who recommends overnight observation by hospitalist service with EEG in the  morning.    Dr. Tyrone Nine to arrange for admission with the hospitalist service. 11:29PM  CRITICAL CARE-yes Performed by: Daleen Bo              Final  Clinical Impression(s) / ED Diagnoses Final diagnoses:  Subdural hematoma  Contusion of scalp, initial encounter    Rx / DC Orders ED Discharge Orders     None         Daleen Bo, MD 09/24/21 2330

## 2021-09-24 NOTE — ED Notes (Signed)
Pt back from mri no complaints at this time.

## 2021-09-24 NOTE — ED Notes (Signed)
Pt care taken, does not remember most of today, family said that she was normal 3 days ago, no other neurologic deficit. Oriented to self.

## 2021-09-25 ENCOUNTER — Inpatient Hospital Stay (HOSPITAL_COMMUNITY): Payer: Medicare Other

## 2021-09-25 DIAGNOSIS — W1830XA Fall on same level, unspecified, initial encounter: Secondary | ICD-10-CM | POA: Diagnosis present

## 2021-09-25 DIAGNOSIS — Z6834 Body mass index (BMI) 34.0-34.9, adult: Secondary | ICD-10-CM | POA: Diagnosis not present

## 2021-09-25 DIAGNOSIS — Z90710 Acquired absence of both cervix and uterus: Secondary | ICD-10-CM | POA: Diagnosis not present

## 2021-09-25 DIAGNOSIS — K219 Gastro-esophageal reflux disease without esophagitis: Secondary | ICD-10-CM | POA: Diagnosis not present

## 2021-09-25 DIAGNOSIS — S0003XA Contusion of scalp, initial encounter: Secondary | ICD-10-CM | POA: Diagnosis not present

## 2021-09-25 DIAGNOSIS — I10 Essential (primary) hypertension: Secondary | ICD-10-CM | POA: Diagnosis not present

## 2021-09-25 DIAGNOSIS — Z8249 Family history of ischemic heart disease and other diseases of the circulatory system: Secondary | ICD-10-CM | POA: Diagnosis not present

## 2021-09-25 DIAGNOSIS — Z79899 Other long term (current) drug therapy: Secondary | ICD-10-CM | POA: Diagnosis not present

## 2021-09-25 DIAGNOSIS — Z808 Family history of malignant neoplasm of other organs or systems: Secondary | ICD-10-CM | POA: Diagnosis not present

## 2021-09-25 DIAGNOSIS — S065XAA Traumatic subdural hemorrhage with loss of consciousness status unknown, initial encounter: Secondary | ICD-10-CM | POA: Diagnosis not present

## 2021-09-25 DIAGNOSIS — E785 Hyperlipidemia, unspecified: Secondary | ICD-10-CM | POA: Diagnosis not present

## 2021-09-25 DIAGNOSIS — G9340 Encephalopathy, unspecified: Secondary | ICD-10-CM | POA: Diagnosis not present

## 2021-09-25 DIAGNOSIS — Z833 Family history of diabetes mellitus: Secondary | ICD-10-CM | POA: Diagnosis not present

## 2021-09-25 DIAGNOSIS — Z85820 Personal history of malignant melanoma of skin: Secondary | ICD-10-CM | POA: Diagnosis not present

## 2021-09-25 DIAGNOSIS — Z8049 Family history of malignant neoplasm of other genital organs: Secondary | ICD-10-CM | POA: Diagnosis not present

## 2021-09-25 DIAGNOSIS — Y92008 Other place in unspecified non-institutional (private) residence as the place of occurrence of the external cause: Secondary | ICD-10-CM | POA: Diagnosis not present

## 2021-09-25 DIAGNOSIS — Z20822 Contact with and (suspected) exposure to covid-19: Secondary | ICD-10-CM | POA: Diagnosis not present

## 2021-09-25 DIAGNOSIS — Z7982 Long term (current) use of aspirin: Secondary | ICD-10-CM | POA: Diagnosis not present

## 2021-09-25 DIAGNOSIS — E669 Obesity, unspecified: Secondary | ICD-10-CM | POA: Diagnosis not present

## 2021-09-25 DIAGNOSIS — I420 Dilated cardiomyopathy: Secondary | ICD-10-CM | POA: Diagnosis not present

## 2021-09-25 LAB — BASIC METABOLIC PANEL
Anion gap: 7 (ref 5–15)
BUN: 23 mg/dL (ref 8–23)
CO2: 24 mmol/L (ref 22–32)
Calcium: 8.7 mg/dL — ABNORMAL LOW (ref 8.9–10.3)
Chloride: 106 mmol/L (ref 98–111)
Creatinine, Ser: 0.71 mg/dL (ref 0.44–1.00)
GFR, Estimated: >60 mL/min (ref >60)
Glucose, Bld: 95 mg/dL (ref 70–99)
Potassium: 3.8 mmol/L (ref 3.5–5.1)
Sodium: 137 mmol/L (ref 135–145)

## 2021-09-25 LAB — RESP PANEL BY RT-PCR (FLU A&B, COVID) ARPGX2
Influenza A by PCR: NEGATIVE
Influenza B by PCR: NEGATIVE
SARS Coronavirus 2 by RT PCR: NEGATIVE

## 2021-09-25 LAB — CBC
HCT: 31.1 % — ABNORMAL LOW (ref 36.0–46.0)
Hemoglobin: 10.1 g/dL — ABNORMAL LOW (ref 12.0–15.0)
MCH: 30.9 pg (ref 26.0–34.0)
MCHC: 32.5 g/dL (ref 30.0–36.0)
MCV: 95.1 fL (ref 80.0–100.0)
Platelets: 185 10*3/uL (ref 150–400)
RBC: 3.27 MIL/uL — ABNORMAL LOW (ref 3.87–5.11)
RDW: 12.8 % (ref 11.5–15.5)
WBC: 8.9 10*3/uL (ref 4.0–10.5)
nRBC: 0 % (ref 0.0–0.2)

## 2021-09-25 LAB — MRSA NEXT GEN BY PCR, NASAL: MRSA by PCR Next Gen: NOT DETECTED

## 2021-09-25 MED ORDER — LACTATED RINGERS IV SOLN: INTRAVENOUS | Status: DC

## 2021-09-25 MED ORDER — SENNOSIDES-DOCUSATE SODIUM 8.6-50 MG PO TABS: 1.0000 | ORAL_TABLET | Freq: Every evening | ORAL | Status: DC | PRN

## 2021-09-25 MED ORDER — ONDANSETRON HCL 4 MG/2ML IJ SOLN
4.0000 mg | Freq: Four times a day (QID) | INTRAMUSCULAR | Status: DC | PRN
  Administered 2021-09-25 (×2): 4 mg via INTRAVENOUS
  Filled 2021-09-25 (×2): qty 2

## 2021-09-25 MED ORDER — ATORVASTATIN CALCIUM 10 MG PO TABS
20.0000 mg | ORAL_TABLET | Freq: Every day | ORAL | Status: DC
Start: 2021-09-25 — End: 2021-09-27
  Administered 2021-09-25 – 2021-09-27 (×3): 20 mg via ORAL
  Filled 2021-09-25 (×3): qty 2

## 2021-09-25 MED ORDER — ACETAMINOPHEN 650 MG RE SUPP: 650.0000 mg | Freq: Four times a day (QID) | RECTAL | Status: DC | PRN

## 2021-09-25 MED ORDER — ONDANSETRON HCL 4 MG PO TABS
4.0000 mg | ORAL_TABLET | Freq: Four times a day (QID) | ORAL | Status: DC | PRN
  Administered 2021-09-27: 4 mg via ORAL
  Filled 2021-09-25: qty 1

## 2021-09-25 MED ORDER — ACETAMINOPHEN 325 MG PO TABS
650.0000 mg | ORAL_TABLET | Freq: Four times a day (QID) | ORAL | Status: DC | PRN
  Administered 2021-09-25 – 2021-09-27 (×4): 650 mg via ORAL
  Filled 2021-09-25 (×4): qty 2

## 2021-09-25 NOTE — ED Provider Notes (Signed)
81 yo F I received in signout from Dr. Eulis Foster, briefly the patient reported headache and confusion.  Found to have a small subdural hematoma.  Case was discussed with neurology and plan for admission at [facility] for EEG.  Discussed the case with hospitalist.  Discussed the case with neurosurgery, Dr. Kathyrn Sheriff did not feel any repeat imaging was warranted.  Felt okay for follow-up in the office in 2 weeks time.   Deno Etienne, DO 09/25/21 0007

## 2021-09-25 NOTE — Evaluation (Signed)
Physical Therapy Evaluation Patient Details Name: Kerry [name] MRN: [id number] DOB: [birth year] Today's Date: 09/25/2021  History of Present Illness  81 y/o female presented to ED on 09/24/21 for confusion, headache, and chest pain. Unable to recall what occured. MRI showed 3 mm SDH on R. PMH: sleep apnea, aortic insufficiency, grade 1 diastolic dysfunction, TIA, HTN  Clinical Impression  Patient admitted with above diagnosis. Patient presents with impaired balance and generalized weakness. Patient functioning at supervision level for ambulation with no AD. Patient lives alone and independent at baseline walking 1 mile about every day. Patient seems cognitively intact on tasks assessed. Patient will benefit from skilled PT services during acute stay to address listed deficits. No PT follow up recommended at this time. Patient may benefit from supervision initially at home for safety.        Recommendations for follow up therapy are one component of a multi-disciplinary discharge planning process, led by the attending physician.  Recommendations may be updated based on patient status, additional functional criteria and insurance authorization.  Follow Up Recommendations No PT follow up    Assistance Recommended at Discharge PRN  Patient can return home with the following       Equipment Recommendations None recommended by PT  Recommendations for Other Services       Functional Status Assessment Patient has had a recent decline in their functional status and demonstrates the ability to make significant improvements in function in a reasonable and predictable amount of time.     Precautions / Restrictions Precautions Precautions: Fall Restrictions Weight Bearing Restrictions: No      Mobility  Bed Mobility Overal bed mobility: Modified Independent                  Transfers Overall transfer level: Needs assistance Equipment used: None Transfers: Sit to/from Stand Sit  to Stand: Supervision                Ambulation/Gait Ambulation/Gait assistance: Supervision Gait Distance (Feet): 200 Feet Assistive device: None Gait Pattern/deviations: Step-through pattern;Decreased stride length Gait velocity: decreased        Stairs            Wheelchair Mobility    Modified Rankin (Stroke Patients Only)       Balance Overall balance assessment: Mild deficits observed, not formally tested                                           Pertinent Vitals/Pain Pain Assessment: No/denies pain    Home Living Family/patient expects to be discharged to:: Private residence Living Arrangements: Alone Available Help at Discharge: Family Type of Home: House Home Access: Stairs to enter   Technical brewer of Steps: 1   Home Layout: One level Home Equipment: Grab bars - tub/shower      Prior Function Prior Level of Function : Independent/Modified Independent             Mobility Comments: walks ~30mile/day       Hand Dominance        Extremity/Trunk Assessment   Upper Extremity Assessment Upper Extremity Assessment: Overall WFL for tasks assessed    Lower Extremity Assessment Lower Extremity Assessment: Generalized weakness    Cervical / Trunk Assessment Cervical / Trunk Assessment: Kyphotic  Communication   Communication: No difficulties  Cognition Arousal/Alertness: Awake/alert Behavior During Therapy: WFL for tasks assessed/performed  Overall Cognitive Status: No family/caregiver present to determine baseline cognitive functioning                                 General Comments: seems [facility]        General Comments      Exercises     Assessment/Plan    PT Assessment Patient needs continued PT services  PT Problem List Decreased strength;Decreased balance       PT Treatment Interventions DME instruction;Gait training;Stair training;Functional mobility training;Therapeutic  exercise;Therapeutic activities;Balance training;Patient/family education    PT Goals (Current goals can be found in the Care Plan section)  Acute Rehab PT Goals Patient Stated Goal: to go home PT Goal Formulation: With patient Time For Goal Achievement: 10/09/21 Potential to Achieve Goals: Good    Frequency Min 3X/week     Co-evaluation               AM-PAC PT "6 Clicks" Mobility  Outcome Measure Help needed turning from your back to your side while in a flat bed without using bedrails?: None Help needed moving from lying on your back to sitting on the side of a flat bed without using bedrails?: None Help needed moving to and from a bed to a chair (including a wheelchair)?: A Little Help needed standing up from a chair using your arms (e.g., wheelchair or bedside chair)?: A Little Help needed to walk in hospital room?: A Little Help needed climbing 3-5 steps with a railing? : A Little 6 Click Score: 20    End of Session   Activity Tolerance: Patient tolerated treatment well Patient left: in bed;with call bell/phone within reach (returned to bed due to EEG) Nurse Communication: Mobility status PT Visit Diagnosis: Unsteadiness on feet (R26.81)    Time: 3267-1245 PT Time Calculation (min) (ACUTE ONLY): 16 min   Charges:   PT Evaluation $PT Eval Low Complexity: 1 Low          Feliz Lincoln A. Gilford Rile PT, DPT Acute Rehabilitation Services Pager [phone] Office [phone]   Linna Hoff 09/25/2021, 12:36 PM

## 2021-09-25 NOTE — Progress Notes (Signed)
Brief same-day note:  Patient is a 81 year old female with history of hypertension, hyperlipidemia who presented for the evaluation of confusion from home.  She lives alone.  No report of fall.  On presentation she was hemodynamically stable.  CT head negative for acute intracranial abnormality.  MRI showed no stroke but showed 3 mm subdural hematoma on the right.  She also had right scalp hematoma.  She was also confused on admission. Neurology was consulted.  Recommended EEG. Patient seen and examined at the bedside this morning.  She is hemodynamically stable.  Very comfortable, alert and oriented. EEG is pending.  We have ordered PT/OT. Will continue current management.  If EEG is normal and if PT/OT recommends home on discharge then probably she can be discharged tomorrow.

## 2021-09-25 NOTE — Progress Notes (Signed)
EEG complete - results pending 

## 2021-09-25 NOTE — H&P (Signed)
History and Physical    [name] WFU:[id number] DOB: Mar 18, 1941 DOA: 09/24/2021  PCP: Libby Maw, MD   Patient coming from: Home  Chief Complaint:  Confusion  HPI: Kerry [name] is a 81 y.o. female with medical history significant for HTN, HLD who presents for evaluation of confusion.  Daughter is at bedside.  Patient lives alone.  Daughter reports that Kerry [name] called her around 5 PM stating that she did not feel well and had a headache.  She was confused and some of the things she would say did not make sense so daughter became concerned and went to her house.  Initially she reported having some chest pressure but but, daughter got to the house that had resolved.  She did not have any slurred speech or drooping face according to the daughter.  No tremors or convulsions according to the daughter when she got there. Kerry [name] does not remember anything that happened and does not member falling but she does have a hematoma on the right side of her head.  Daughter reports that 46 to 26 years ago her mother had a similar episode of memory loss and was told she had a TIA.  She had an EEG at that time but is not sure what the results were.  She was seen by her PCP yesterday for routine checkup according to the daughter and everything was fine then.  ED Course: She has been hemodynamically stable in the emergency room.  CT of the head was negative for acute intracranial pathology.  MRI showed no acute stroke but did show a 3 mm subdural hematoma on the right.  She also has a right scalp hematoma.  No skull fracture identified.  VBG and CMP are unremarkable.  Troponin level was 9 twice the emergency room.  WBC 12,500 hemoglobin 11.9 hematocrit 35.7 platelets 225,000.  COVID swab pending.  Hospitalist service is asked to admit patient to [facility] for EEG and further work-up  Review of Systems:  Accurate review of systems cannot be obtained secondary to encephalopathy  Past  Medical History:  Diagnosis Date   Allergy    Anemia    in her 20's   Aortic insufficiency    mild by echo 10/2020   Arthritis    not dx'd- just per pt    Asthma    AS A CHILD   Cancer ([place])    endometrial cancer with hysterectomy in 1987   Cancer ([place])    melanoma on face    Cataract    removed both eyes    DCM (dilated cardiomyopathy) ([place])    EF 50-55%   GERD (gastroesophageal reflux disease)    occ has with diet    History of syncope    HTN (hypertension)    Hyperlipidemia    OSA on CPAP    TIA (transient ischemic attack)     Past Surgical History:  Procedure Laterality Date   ABDOMINAL HYSTERECTOMY     ANKLE FRACTURE SURGERY     APPENDECTOMY     CATARACT EXTRACTION, BILATERAL     CHOLECYSTECTOMY     COLONOSCOPY  08/01/2007   Dr Henrene Pastor    DENTAL SURGERY     ovary removal     right leg  fracture surgery       Social History  reports that she has never smoked. She has never used smokeless tobacco. She reports that she does not drink alcohol and does not use drugs.  Allergies  Allergen Reactions   Lovastatin Other (See Comments)    myalgias   Sudafed [Pseudoephedrine Hcl] Other (See Comments)    Hallucinations     Family History  Problem Relation Age of Onset   Mitral valve prolapse Daughter    Heart disease Mother    Kidney failure Mother    Diabetes Mother    Hyperlipidemia Mother    Hypertension Mother    Uterine cancer Mother    Dementia Father    Diabetes Sister    Multiple sclerosis Sister    Diabetes Paternal Grandmother    Throat cancer Brother        smoker   Colon cancer Neg Hx    Colon polyps Neg Hx    Esophageal cancer Neg Hx    Rectal cancer Neg Hx    Stomach cancer Neg Hx      Prior to Admission medications   Medication Sig Start Date End Date Taking? Authorizing Provider  acetaminophen (TYLENOL) 500 MG tablet Take 500 mg by mouth every 6 (six) hours as needed for moderate pain or headache.     [provider]   Ascorbic Acid (VITAMIN C PO) Take 1 tablet by mouth daily.    [provider]  aspirin 81 MG chewable tablet Chew 81 mg by mouth daily. Every other day per pt    [provider]  atorvastatin (LIPITOR) 20 MG tablet TAKE 1 TABLET(20 MG) BY MOUTH DAILY 10/15/20   Sueanne Margarita, MD  carvedilol (COREG) 12.5 MG tablet TAKE 1 TABLET(12.5 MG) BY MOUTH TWICE DAILY 09/03/21   Sueanne Margarita, MD  Cholecalciferol (VITAMIN D3) 2000 units capsule Take 2,000 Units by mouth daily.    [provider]  Coenzyme Q10 (CO Q 10 PO) Take 1 capsule by mouth daily.    [provider]  fexofenadine (ALLEGRA) 180 MG tablet Take 180 mg by mouth daily.    [provider]  hydrochlorothiazide (HYDRODIURIL) 25 MG tablet Take 1 tablet (25 mg total) by mouth daily. Please keep upcoming appt in February 2023 with Dr. Radford Pax before anymore refills. Thank you Final Attempt 08/24/21   Sueanne Margarita, MD  lisinopril (ZESTRIL) 20 MG tablet Take 1 tablet (20 mg total) by mouth daily. Please keep upcoming appt in February 2023 with Dr. Radford Pax before anymore refills. Thank you Final attempt 08/24/21   Sueanne Margarita, MD  Misc Natural Products (NEURIVA PO) Take by mouth daily.    [provider]  Multiple Vitamin (MULTIVITAMIN) tablet Take 1 tablet by mouth daily.    [provider]  triamcinolone cream (KENALOG) 0.1 % Apply topically 2 (two) times daily. X 2 weeks 02/10/21   [provider]    Physical Exam: Vitals:   09/24/21 2030 09/24/21 2100 09/24/21 2130 09/24/21 2242  BP: (!) 126/56 (!) 116/52 (!) 121/54 (!) 115/45  Pulse: 79 84 84 83  Resp: 19 15 15 16   SpO2: 95% 95% 95% 96%  Weight:      Height:        Constitutional: NAD, calm, comfortable Vitals:   09/24/21 2030 09/24/21 2100 09/24/21 2130 09/24/21 2242  BP: (!) 126/56 (!) 116/52 (!) 121/54 (!) 115/45  Pulse: 79 84 84 83  Resp: 19 15 15 16   SpO2: 95% 95% 95% 96%  Weight:      Height:        General: WDWN, Alert and oriented x person and place. Lethargic after receiving ativan in ER.  Eyes: PERRL, conjunctivae normal.  Sclera nonicteric HENT:  [place]. Hematoma on right scalp. external ears normal.  Nares patent without epistasis.  Mucous membranes are moist. Posterior pharynx clear of any exudate  Neck: Soft, normal range of motion, supple, no masses, Trachea midline Respiratory: clear to auscultation bilaterally, no wheezing, no crackles. Normal respiratory effort. No accessory muscle use.  Cardiovascular: Regular rate and rhythm, no murmurs / rubs / gallops. No extremity edema. 2+ pedal pulses.  Abdomen: Soft, no tenderness, nondistended, no rebound or guarding. Bowel sounds normoactive Musculoskeletal: FROM. no cyanosis. No joint deformity upper and lower extremities. Normal muscle tone.  Skin: Warm, dry, intact no rashes, lesions, ulcers.  Neurologic: CN 2-12 grossly intact. Normal speech. Sensation intact to light touch, patella DTR +1 bilaterally. Strength 4/5 in all extremities.     Labs on Admission: I have personally reviewed following labs and imaging studies  CBC: Recent Labs  Lab 09/24/21 1935  WBC 12.5*  HGB 11.9*  HCT 35.7*  MCV 94.4  PLT 381    Basic Metabolic Panel: Recent Labs  Lab 09/24/21 1935  NA 136  K 4.3  CL 105  CO2 24  GLUCOSE 112*  BUN 23  CREATININE 0.81  CALCIUM 9.2    GFR: Estimated Creatinine Clearance: 59.1 mL/min (by C-G formula based on SCr of 0.81 mg/dL).  Liver Function Tests: Recent Labs  Lab 09/24/21 1935  AST 24  ALT 21  ALKPHOS 98  BILITOT 0.6  PROT 7.3  ALBUMIN 3.7    Urine analysis:    Component Value Date/Time   COLORURINE YELLOW 09/24/2021 [place] 09/24/2021 1854   LABSPEC 1.017 09/24/2021 1854   PHURINE 5.0 09/24/2021 1854   GLUCOSEU NEGATIVE 09/24/2021 1854   GLUCOSEU NEGATIVE 03/12/2021 0816   HGBUR NEGATIVE 09/24/2021 1854   BILIRUBINUR NEGATIVE 09/24/2021 1854   BILIRUBINUR  negative 02/22/2018 1741   KETONESUR NEGATIVE 09/24/2021 1854   PROTEINUR NEGATIVE 09/24/2021 1854   UROBILINOGEN 0.2 03/12/2021 0816   NITRITE NEGATIVE 09/24/2021 1854   LEUKOCYTESUR NEGATIVE 09/24/2021 1854    Radiological Exams on Admission: CT Head Wo Contrast  Result Date: 09/24/2021 CLINICAL DATA:  Mental status change, unknown cause EXAM: CT HEAD WITHOUT CONTRAST TECHNIQUE: Contiguous axial images were obtained from the base of the skull through the vertex without intravenous contrast. RADIATION DOSE REDUCTION: This exam was performed according to the departmental dose-optimization program which includes automated exposure control, adjustment of the mA and/or kV according to patient size and/or use of iterative reconstruction technique. COMPARISON:  06/20/2019 FINDINGS: Brain: No evidence of acute infarction, hemorrhage, cerebral edema, mass, mass effect, or midline shift. No hydrocephalus or extra-axial fluid collection. Vascular: No hyperdense vessel. Skull: Normal. Negative for fracture or focal lesion. Sinuses/Orbits: No acute finding. Other: Trace fluid in right mastoid air cells. Small right frontal scalp hematoma. IMPRESSION: IMPRESSION No acute intracranial process. No etiology is seen for the patient's altered mental status. Electronically Signed   By: Merilyn Baba M.D.   On: 09/24/2021 19:23   MR BRAIN WO CONTRAST  Result Date: 09/24/2021 CLINICAL DATA:  Acute neurologic deficits EXAM: MRI HEAD WITHOUT CONTRAST TECHNIQUE: Multiplanar, multiecho pulse sequences of the brain and surrounding structures were obtained without intravenous contrast. COMPARISON:  06/20/2019 FINDINGS: Brain: There is a small right hemispheric subdural hematoma measuring 3 mm. No mass effect or midline shift. No acute infarct. There is multifocal hyperintense T2-weighted signal within the white matter. Parenchymal volume and CSF spaces are normal. The midline structures  are normal. Old, punctate left  cerebellar infarct. Vascular: Major flow voids are preserved. Skull and upper cervical spine: Right scalp subgaleal hematoma. Sinuses/Orbits:No paranasal sinus fluid levels or advanced mucosal thickening. No mastoid or middle ear effusion. Normal orbits. IMPRESSION: 1. Small right hemispheric subdural hematoma measuring 3 mm. No mass effect or midline shift. 2. Findings of chronic small vessel ischemia. 3. Right scalp hematoma. Critical Value/emergent results were called by telephone at the time of interpretation on 09/24/2021 at 10:57 pm to provider [facility] , who verbally acknowledged these results. Electronically Signed   By: Ulyses Jarred M.D.   On: 09/24/2021 22:57   DG Chest Port 1 View  Result Date: 09/24/2021 CLINICAL DATA:  Chest pain and confusion for 1 day EXAM: PORTABLE CHEST 1 VIEW COMPARISON:  02/22/2018 FINDINGS: Heart is at the upper limits of normal. The lungs are clear bilaterally. No focal infiltrate or effusion is seen. No bony abnormality is noted. IMPRESSION: No acute abnormality noted. Electronically Signed   By: Inez Catalina M.D.   On: 09/24/2021 19:28    EKG: Independently reviewed.  EKG shows normal sinus rhythm with no acute ST elevation or depression.  LVH by voltage criteria.  QTc 450  Assessment/Plan Principal Problem:   Encephalopathy acute Kerry [name] is admitted to [facility]. Pt with acute encephalopathy and cannot give details of events of day. Neurology recommended obtaining EEG. If EEG abnormal Neurology will see for official consult at [facility].  Fall precautions ordered.   Active Problems:   Subdural hematoma Hold anticoagulation. Subdural hematoma is small at 3 mm with no midline shift.  ER physician discussed with neurosurgery who stated no need to reimage unless patient clinically worsens.  Neurosurgery will see as outpatient in a few weeks for follow-up    Essential hypertension Controlled blood pressure to goal of less than 140/90 but to make  sure he does not go hypotensive.  Home antihypertensives be verified and reconciled by pharmacy and resumed in the morning     Hyperlipidemia Continue Lipitor.  Patient had recent lipid panel which is reviewed in chart  DVT prophylaxis: SCDs for DVT prophylaxis. No anticoagulation with subdural hematoma  Code Status:   Full Code  Family Communication:  Diagnosis and plan discussed with patient and her daughter who is at bedside.  Daughter verbalized understanding and agrees with plan.  Further recommendations to follow as clinically indicated Disposition Plan:   Patient is from:  Home  Anticipated DC to:  To be determined  Anticipated DC date:  Anticipate 2 midnight stay  Time spent on admission: 55 minutes  Admission status:  Inpatient  Yevonne Aline Taiten Brawn MD [facility]  How to contact the [facility] Attending or Consulting provider [place] or covering provider during after hours [place], for this patient?   Check the care team in [facility] and look for a) attending/consulting [facility] provider listed and b) the [facility] team listed Log into [email or web address] and use [place]'s universal password to access. If you do not have the password, please contact the hospital operator. Locate the [facility] provider you are looking for under [facility] and page to a number that you can be directly reached. If you still have difficulty reaching the provider, please page the [facility] (Director on Call) for the Hospitalists listed on amion for assistance.  09/25/2021, 12:06 AM

## 2021-09-25 NOTE — ED Notes (Signed)
Attempted report, informed RN is prepping pt for surgery, will call back.

## 2021-09-26 DIAGNOSIS — S065XAA Traumatic subdural hemorrhage with loss of consciousness status unknown, initial encounter: Secondary | ICD-10-CM | POA: Diagnosis not present

## 2021-09-26 DIAGNOSIS — G9340 Encephalopathy, unspecified: Secondary | ICD-10-CM | POA: Diagnosis not present

## 2021-09-26 NOTE — Progress Notes (Addendum)
PROGRESS NOTE    Kerry [name]  ZOX:[id number] DOB: Mar 31, 1941 DOA: 09/24/2021 PCP: Libby Maw, MD   Chief Complain:Confusion  Brief Narrative:  Patient is a 81 year old female with history of hypertension, hyperlipidemia who presented for the evaluation of confusion from home.  She lives alone.  No report of fall.  On presentation she was hemodynamically stable.  CT head negative for acute intracranial abnormality.  MRI showed no stroke but showed 3 mm subdural hematoma on the right.  She also had right scalp hematoma.  She was also confused on admission. Neurology was consulted.  Underwent EEG, report pending .  PT/OT did not recommend follow-up.  Assessment & Plan:   Principal Problem:   Encephalopathy acute Active Problems:   Essential hypertension   Hyperlipidemia   Subdural hematoma  Acute encephalopathy: Currently alert oriented.  EEG done, no report yet.  No history of seizures.  Fall/subdural hematoma:MRI showed no stroke but showed 3 mm subdural hematoma on the right.  No new change in the mental status.  Currently alert and oriented.  Case discussed with neurosurgery, no intervention, follow-up as an outpatient with neurology.  Hypertension: Overall stable.  Continue current medications  Hyperlipidemia: On Lipitor  Obesity: BMI of 34.2         DVT prophylaxis: SCD Code Status: Full code Family Communication: Daughter on phone Patient status: Inpatient  Dispo: The patient is from: Home              Anticipated d/c is to: Home              Anticipated d/c date is: After EEG report  Consultants: None  Procedures: None  Antimicrobials:  Anti-infectives (From admission, onward)    None       Subjective: Patient seen and examined at the bedside this morning.  Hemodynamically stable.  Comfortable.  Complains of some nausea today.  No new other complaints  Objective: Vitals:   09/26/21 0708 09/26/21 0900 09/26/21 1100 09/26/21 1152  BP:  (!) 141/57   (!) 147/57  Pulse: 76 73 65 69  Resp: 19 14 14 20   Temp: 97.8 F (36.6 C)   97.9 F (36.6 C)  TempSrc: Oral   Oral  SpO2: 91% 94% 93% 93%  Weight:      Height:        Intake/Output Summary (Last 24 hours) at 09/26/2021 1420 Last data filed at 09/25/2021 1700 Gross per 24 hour  Intake 1105.51 ml  Output --  Net 1105.51 ml   Filed Weights   09/24/21 1851  Weight: 87.6 kg    Examination:  General exam: Overall comfortable, not in distress,obese HEENT: PERRL Respiratory system:  no wheezes or crackles  Cardiovascular system: S1 & S2 heard, RRR.  Gastrointestinal system: Abdomen is nondistended, soft and nontender. Central nervous system: Alert and oriented Extremities: No edema, no clubbing ,no cyanosis Skin: No rashes, no ulcers,no icterus      Data Reviewed: I have personally reviewed following labs and imaging studies  CBC: Recent Labs  Lab 09/24/21 1935 09/25/21 0500  WBC 12.5* 8.9  HGB 11.9* 10.1*  HCT 35.7* 31.1*  MCV 94.4 95.1  PLT 225 811   Basic Metabolic Panel: Recent Labs  Lab 09/24/21 1935 09/25/21 0500  NA 136 137  K 4.3 3.8  CL 105 106  CO2 24 24  GLUCOSE 112* 95  BUN 23 23  CREATININE 0.81 0.71  CALCIUM 9.2 8.7*   GFR: Estimated Creatinine Clearance: 59.9 mL/min (  by C-G formula based on SCr of 0.71 mg/dL). Liver Function Tests: Recent Labs  Lab 09/24/21 1935  AST 24  ALT 21  ALKPHOS 98  BILITOT 0.6  PROT 7.3  ALBUMIN 3.7   No results for input(s): LIPASE, AMYLASE in the last 168 hours. No results for input(s): AMMONIA in the last 168 hours. Coagulation Profile: No results for input(s): INR, PROTIME in the last 168 hours. Cardiac Enzymes: No results for input(s): CKTOTAL, CKMB, CKMBINDEX, TROPONINI in the last 168 hours. BNP (last 3 results) No results for input(s): PROBNP in the last 8760 hours. HbA1C: No results for input(s): HGBA1C in the last 72 hours. CBG: No results for input(s): GLUCAP in the last 168  hours. Lipid Profile: No results for input(s): CHOL, HDL, LDLCALC, TRIG, CHOLHDL, LDLDIRECT in the last 72 hours. Thyroid Function Tests: No results for input(s): TSH, T4TOTAL, FREET4, T3FREE, THYROIDAB in the last 72 hours. Anemia Panel: No results for input(s): VITAMINB12, FOLATE, FERRITIN, TIBC, IRON, RETICCTPCT in the last 72 hours. Sepsis Labs: No results for input(s): PROCALCITON, LATICACIDVEN in the last 168 hours.  Recent Results (from the past 240 hour(s))  Resp Panel by RT-PCR (Flu A&B, Covid) Nasopharyngeal Swab     Status: None   Collection Time: 09/24/21 11:16 PM   Specimen: Nasopharyngeal Swab; Nasopharyngeal(NP) swabs in vial transport medium  Result Value Ref Range Status   SARS Coronavirus 2 by RT PCR NEGATIVE NEGATIVE Final    Comment: (NOTE) SARS-CoV-2 target nucleic acids are NOT DETECTED.  The SARS-CoV-2 RNA is generally detectable in upper respiratory specimens during the acute phase of infection. The lowest concentration of SARS-CoV-2 viral copies this assay can detect is 138 copies/mL. A negative result does not preclude SARS-Cov-2 infection and should not be used as the sole basis for treatment or other patient management decisions. A negative result may occur with  improper specimen collection/handling, submission of specimen other than nasopharyngeal swab, presence of viral mutation(s) within the areas targeted by this assay, and inadequate number of viral copies(<138 copies/mL). A negative result must be combined with clinical observations, patient history, and epidemiological information. The expected result is Negative.  Fact Sheet for Patients:  EntrepreneurPulse.com.au  Fact Sheet for Healthcare Providers:  IncredibleEmployment.be  This test is no t yet approved or cleared by the [place] FDA and  has been authorized for detection and/or diagnosis of SARS-CoV-2 by FDA under an Emergency Use Authorization  (EUA). This EUA will remain  in effect (meaning this test can be used) for the duration of the COVID-19 declaration under Section 564(b)(1) of the Act, 21 U.S.C.section 360bbb-3(b)(1), unless the authorization is terminated  or revoked sooner.       Influenza A by PCR NEGATIVE NEGATIVE Final   Influenza B by PCR NEGATIVE NEGATIVE Final    Comment: (NOTE) The Xpert Xpress SARS-CoV-2/FLU/RSV plus assay is intended as an aid in the diagnosis of influenza from Nasopharyngeal swab specimens and should not be used as a sole basis for treatment. Nasal washings and aspirates are unacceptable for Xpert Xpress SARS-CoV-2/FLU/RSV testing.  Fact Sheet for Patients: EntrepreneurPulse.com.au  Fact Sheet for Healthcare Providers: IncredibleEmployment.be  This test is not yet approved or cleared by the [place] FDA and has been authorized for detection and/or diagnosis of SARS-CoV-2 by FDA under an Emergency Use Authorization (EUA). This EUA will remain in effect (meaning this test can be used) for the duration of the COVID-19 declaration under Section 564(b)(1) of the Act, 21 U.S.C. section 360bbb-3(b)(1), unless  the authorization is terminated or revoked.  Performed at [facility], [place] [address], [place], [place] [address]   MRSA Next Gen by PCR, Nasal     Status: None   Collection Time: 09/25/21  9:38 AM   Specimen: Nasal Mucosa; Nasal Swab  Result Value Ref Range Status   MRSA by PCR Next Gen NOT DETECTED NOT DETECTED Final    Comment: (NOTE) The GeneXpert MRSA Assay (FDA approved for NASAL specimens only), is one component of a comprehensive MRSA colonization surveillance program. It is not intended to diagnose MRSA infection nor to guide or monitor treatment for MRSA infections. Test performance is not FDA approved in patients less than 33 years old. Performed at [facility], [place] [address] [place],  [place] [address]          Radiology Studies: CT Head Wo Contrast  Result Date: 09/24/2021 CLINICAL DATA:  Mental status change, unknown cause EXAM: CT HEAD WITHOUT CONTRAST TECHNIQUE: Contiguous axial images were obtained from the base of the skull through the vertex without intravenous contrast. RADIATION DOSE REDUCTION: This exam was performed according to the departmental dose-optimization program which includes automated exposure control, adjustment of the mA and/or kV according to patient size and/or use of iterative reconstruction technique. COMPARISON:  06/20/2019 FINDINGS: Brain: No evidence of acute infarction, hemorrhage, cerebral edema, mass, mass effect, or midline shift. No hydrocephalus or extra-axial fluid collection. Vascular: No hyperdense vessel. Skull: Normal. Negative for fracture or focal lesion. Sinuses/Orbits: No acute finding. Other: Trace fluid in right mastoid air cells. Small right frontal scalp hematoma. IMPRESSION: IMPRESSION No acute intracranial process. No etiology is seen for the patient's altered mental status. Electronically Signed   By: Merilyn Baba M.D.   On: 09/24/2021 19:23   MR BRAIN WO CONTRAST  Result Date: 09/24/2021 CLINICAL DATA:  Acute neurologic deficits EXAM: MRI HEAD WITHOUT CONTRAST TECHNIQUE: Multiplanar, multiecho pulse sequences of the brain and surrounding structures were obtained without intravenous contrast. COMPARISON:  06/20/2019 FINDINGS: Brain: There is a small right hemispheric subdural hematoma measuring 3 mm. No mass effect or midline shift. No acute infarct. There is multifocal hyperintense T2-weighted signal within the white matter. Parenchymal volume and CSF spaces are normal. The midline structures are normal. Old, punctate left cerebellar infarct. Vascular: Major flow voids are preserved. Skull and upper cervical spine: Right scalp subgaleal hematoma. Sinuses/Orbits:No paranasal sinus fluid levels or advanced mucosal thickening. No mastoid  or middle ear effusion. Normal orbits. IMPRESSION: 1. Small right hemispheric subdural hematoma measuring 3 mm. No mass effect or midline shift. 2. Findings of chronic small vessel ischemia. 3. Right scalp hematoma. Critical Value/emergent results were called by telephone at the time of interpretation on 09/24/2021 at 10:57 pm to provider [facility] , who verbally acknowledged these results. Electronically Signed   By: Ulyses Jarred M.D.   On: 09/24/2021 22:57   DG Chest Port 1 View  Result Date: 09/24/2021 CLINICAL DATA:  Chest pain and confusion for 1 day EXAM: PORTABLE CHEST 1 VIEW COMPARISON:  02/22/2018 FINDINGS: Heart is at the upper limits of normal. The lungs are clear bilaterally. No focal infiltrate or effusion is seen. No bony abnormality is noted. IMPRESSION: No acute abnormality noted. Electronically Signed   By: Inez Catalina M.D.   On: 09/24/2021 19:28        Scheduled Meds:  atorvastatin  20 mg Oral Daily   Continuous Infusions:  lactated ringers 75 mL/hr at 09/26/21 1241  LOS: 2 days        Shelly Coss, MD [facility] P1/15/2023, 2:20 PM

## 2021-09-26 NOTE — Procedures (Signed)
[facility]  Routine EEG Report  Duration: 23 min  Patient Name:   Kerry [name], Kerry [name] Date of Birth:   [birth year] Identification Number:   MRN - [id number]  Date of Study:   09/26/2021 12:23:40  Indication: Encephalopathy,  Technical Summary: A routine 20 channel electroencephalogram using the international 10-20 system of electrode placement was performed.  Background: 7-8 Hz, Posterior dominant rhythm that attenuated with eye Opening  States       Awake  Abnormalities  Generalized Slowing: Diffuse generalized slowing. There were runs of Frontal intermittent rhythmic delta activity.   Activation Procedures  Hyperventilation: Not performed  Photic Stimulation: Not performed  Classification: Abnormal :  Diagnosis: This is an abnormal EEG due to intermittent bilateral slowing concerning for non-specific encephalopathy.      Dr Tsosie Billing   [facility] [phone]  Case [id number]

## 2021-09-26 NOTE — Evaluation (Signed)
Occupational Therapy Evaluation Patient Details Name: Kerry [name] MRN: [id number] DOB: 1941-08-30 Today's Date: 09/26/2021   History of Present Illness 81 y/o female presented to ED on 09/24/21 for confusion, headache, and chest pain. Unable to recall what occured. MRI showed 3 mm SDH on R. PMH: sleep apnea, aortic insufficiency, grade 1 diastolic dysfunction, TIA, HTN   Clinical Impression   PT admitted with R SDH s/p fall. Pt currently with functional limitiations due to the deficits listed below (see OT problem list). Pt unable to recall events that lead to admission or reason for admission. Pt given concussion handout and advised to speak with daughter. Pt simulated x4 cat bowels for cats at home and reports headache with task. Pt symptomatic with head position change and light. Pt noted to keep L UE behind her at times during session instead of at her side ( almost adducted to buttock L ) When cued pt immediately moved L UE. Pt does not do this in sitting only during functional task.  Pt will benefit from skilled OT to increase their independence and safety with adls and balance to allow discharge home with family (if family can provide (A) could possible benefit from Outpatient OT follow up). Pt signed off so no home health possible.      Recommendations for follow up therapy are one component of a multi-disciplinary discharge planning process, led by the attending physician.  Recommendations may be updated based on patient status, additional functional criteria and insurance authorization.   Follow Up Recommendations  No OT follow up / outpatient could be beneficial for cognition    Assistance Recommended at Discharge PRN  Patient can return home with the following      Functional Status Assessment  Patient has had a recent decline in their functional status and demonstrates the ability to make significant improvements in function in a reasonable and predictable amount of time.   Equipment Recommendations  None recommended by OT    Recommendations for Other Services       Precautions / Restrictions Precautions Precautions: Fall Restrictions Weight Bearing Restrictions: No      Mobility Bed Mobility               General bed mobility comments: oob in chair on arrival    Transfers Overall transfer level: Needs assistance   Transfers: Sit to/from Stand Sit to Stand: Min guard                  Balance Overall balance assessment: Mild deficits observed, not formally tested                                         ADL either performed or assessed with clinical judgement   ADL Overall ADL's : Needs assistance/impaired Eating/Feeding: Set up;Sitting Eating/Feeding Details (indicate cue type and reason): reports smell of food makes her nauseated Grooming: Wash/dry hands;Supervision/safety               Lower Body Dressing: Moderate assistance Lower Body Dressing Details (indicate cue type and reason): pt unable to sustain holding L UE on brief and dropping the strap. Toilet Transfer: Economist and Hygiene: Minimal assistance;Sit to/from stand       Functional mobility during ADLs: Min guard       Vision Baseline Vision/History: 1 Wears glasses Additional Comments: light sensitive with  HA currently advised to have sunglasses for d/c home     Perception     Praxis      Pertinent Vitals/Pain Pain Assessment: Faces Faces Pain Scale: Hurts little more Pain Location: head Pain Descriptors / Indicators: Headache Pain Intervention(s): Repositioned     Hand Dominance Right   Extremity/Trunk Assessment Upper Extremity Assessment Upper Extremity Assessment: LUE deficits/detail LUE Deficits / Details: when standing placing L UE behind her back. when asked why pt was unable to state why then states "oh its becuase of this line i am getting it out of the way"  Pt is very R hand dominant but had complete wash cloth folding task. pt completing task almost 100% with R UE with L UE just anteriorly position. OT cued pt to use L UE and abl eto complete task. pt denies any sensory changes in L UE. LUE Sensation: decreased proprioception LUE Coordination: decreased fine motor   Lower Extremity Assessment Lower Extremity Assessment: Defer to PT evaluation   Cervical / Trunk Assessment Cervical / Trunk Assessment: Kyphotic   Communication Communication Communication: No difficulties   Cognition Arousal/Alertness: Awake/alert Behavior During Therapy: WFL for tasks assessed/performed Overall Cognitive Status: Impaired/Different from baseline Area of Impairment: Awareness                           Awareness: Emergent   General Comments: pt appears to be okay for basic needs, but lacks awareness to fall risk or decreased use of L UE compared to R ue. pt unable to recall when she was admitted or that SDH occurred. pt reports coming to hospital yesterday. pt is able to make change for 5 dollar bill and count by 5 to 25     General Comments  noted to have bruise and swollen area to the right side of her head . advised to ice. Pt given handout for concussion.    Exercises     Shoulder Instructions      Home Living Family/patient expects to be discharged to:: Private residence Living Arrangements: Alone Available Help at Discharge: Family Type of Home: House Home Access: Stairs to enter Technical brewer of Steps: 1 Entrance Stairs-Rails: Can reach both Home Layout: One level     Bathroom Shower/Tub: Teacher, early years/pre: Standard     Home Equipment: Grab bars - tub/shower   Additional Comments: x4 cats at home( 2 of the cats are her granddaughters that passed away) Names are [name], [name]      Prior Functioning/Environment Prior Level of Function : Independent/Modified Independent              Mobility Comments: walks mile a day          OT Problem List: Impaired balance (sitting and/or standing);Decreased cognition;Decreased safety awareness;Decreased knowledge of use of DME or AE;Decreased knowledge of precautions;Decreased coordination;Impaired UE functional use      OT Treatment/Interventions: Self-care/ADL training;Therapeutic exercise;Neuromuscular education;DME and/or AE instruction;Energy conservation;Therapeutic activities;Cognitive remediation/compensation;Patient/family education;Balance training    OT Goals(Current goals can be found in the care plan section) Acute Rehab OT Goals Patient Stated Goal: to go home to cats OT Goal Formulation: With patient Time For Goal Achievement: 10/10/21 Potential to Achieve Goals: Good  OT Frequency: Min 2X/week    Co-evaluation              AM-PAC OT "6 Clicks" Daily Activity     Outcome Measure Help from another person eating  meals?: None Help from another person taking care of personal grooming?: None Help from another person toileting, which includes using toliet, bedpan, or urinal?: A Little Help from another person bathing (including washing, rinsing, drying)?: A Little Help from another person to put on and taking off regular upper body clothing?: None Help from another person to put on and taking off regular lower body clothing?: A Little 6 Click Score: 21   End of Session Nurse Communication: Mobility status;Precautions  Activity Tolerance: Patient tolerated treatment well Patient left: in chair;with call bell/phone within reach;with chair alarm set  OT Visit Diagnosis: Unsteadiness on feet (R26.81)                Time: 1610-9604 OT Time Calculation (min): 18 min Charges:  OT General Charges $OT Visit: 1 Visit OT Evaluation $OT Eval Moderate Complexity: 1 Mod   Brynn, OTR/L  Acute Rehabilitation Services Pager: [phone] Office: [phone] .   Jeri Modena 09/26/2021, 11:36 AM

## 2021-09-27 DIAGNOSIS — G9340 Encephalopathy, unspecified: Secondary | ICD-10-CM | POA: Diagnosis not present

## 2021-09-27 DIAGNOSIS — S065XAA Traumatic subdural hemorrhage with loss of consciousness status unknown, initial encounter: Secondary | ICD-10-CM | POA: Diagnosis not present

## 2021-09-27 NOTE — Progress Notes (Signed)
Occupational Therapy Treatment Patient Details Name: YEIMI DEBNAM MRN: [id number] DOB: [birth year] Today's Date: 09/27/2021   History of present illness 81 y/o female presented to ED on 09/24/21 for confusion, headache, and chest pain. Unable to recall what occured. MRI showed 3 mm SDH on R. PMH: sleep apnea, aortic insufficiency, grade 1 diastolic dysfunction, TIA, HTN   OT comments  Pt making steady progress towards OT goals this session. Session focus on path finding task, functional mobility, and BADL reeducation. Pt currently requires supervision - set - up assist for ADLs and min guard for functional mobility greater than a household distance with no AD. Pt completed 5 step path finding task with pt needing MOD cues. Pt completed toileting and standing grooming tasks with supervision- set- up assist. Pt would continue to benefit from skilled occupational therapy while admitted to address the below listed limitations in order to improve overall functional mobility and facilitate independence with BADL participation. DC plan remains appropriate, will follow acutely per POC.      Recommendations for follow up therapy are one component of a multi-disciplinary discharge planning process, led by the attending physician.  Recommendations may be updated based on patient status, additional functional criteria and insurance authorization.    Follow Up Recommendations  No OT follow up    Assistance Recommended at Discharge PRN  Patient can return home with the following      Equipment Recommendations  None recommended by OT    Recommendations for Other Services      Precautions / Restrictions Precautions Precautions: Fall Restrictions Weight Bearing Restrictions: No       Mobility Bed Mobility               General bed mobility comments: OOB in recliner    Transfers Overall transfer level: Needs assistance Equipment used: None Transfers: Sit to/from Stand Sit to Stand:  Supervision           General transfer comment: supervision for safety and handling lines/leads     Balance Overall balance assessment: Needs assistance Sitting-balance support: No upper extremity supported;Feet supported Sitting balance-Leahy Scale: Good     Standing balance support: No upper extremity supported;During functional activity Standing balance-Leahy Scale: Fair Standing balance comment: standing at sink for ADLs with no UE support or LOB                           ADL either performed or assessed with clinical judgement   ADL Overall ADL's : Needs assistance/impaired     Grooming: Wash/dry hands;Supervision/safety Grooming Details (indicate cue type and reason): standing at sink     Lower Body Bathing: Supervison/ safety;Sitting/lateral leans Lower Body Bathing Details (indicate cue type and reason): simulated via pericare and LB dressing Upper Body Dressing : Set up;Sitting Upper Body Dressing Details (indicate cue type and reason): to don back side gown Lower Body Dressing: Supervision/safety;Set up;Sitting/lateral leans Lower Body Dressing Details (indicate cue type and reason): to don new underwear from toilet seat Toilet Transfer: Min guard;Regular Toilet;Grab bars   Toileting- Clothing Manipulation and Hygiene: Supervision/safety;Sitting/lateral lean       Functional mobility during ADLs: Min guard General ADL Comments: pt continues to present with impaired safety awareness, impaired cognition and decreased activity tolerance    Extremity/Trunk Assessment Upper Extremity Assessment Upper Extremity Assessment: Overall WFL for tasks assessed; no sigs of LUE inattention as indicated in previous OT note   Lower Extremity Assessment Lower Extremity Assessment:  Defer to PT evaluation   Cervical / Trunk Assessment Cervical / Trunk Assessment: Kyphotic    Vision Baseline Vision/History: 1 Wears glasses Patient Visual Report: No change from  baseline Additional Comments: photophobia   Perception Perception Perception: Not tested   Praxis Praxis Praxis: Not tested    Cognition Arousal/Alertness: Awake/alert Behavior During Therapy: WFL for tasks assessed/performed Overall Cognitive Status: Impaired/Different from baseline Area of Impairment: Awareness                           Awareness: Emergent   General Comments: pt completed 5 step path finding task with MOD cues as pt needed assist locating nurses station and states "I've never known my right from my left" pt also needed assist locating room numbers in hallway. pt AxOx4 but reports she was ready to go home yesterday and already had all of her DC paper when really it was the concussion handout that the previous OT had given her.          Exercises     Shoulder Instructions       General Comments pt on RA with SpO2 WFL, moment of RR increasing to 40s with exertion, pt reports "its cause the mask" issued pt handouts on energy conservation and  fall prevention. discussed fall prevention strategies and energy conservation strategies for home with pt verbalizing understanding of education    Pertinent Vitals/ Pain       Pain Assessment: Faces Faces Pain Scale: Hurts a little bit Pain Location: HA Pain Descriptors / Indicators: Aching Pain Intervention(s): Monitored during session  Home Living                                          Prior Functioning/Environment              Frequency  Min 2X/week        Progress Toward Goals  OT Goals(current goals can now be found in the care plan section)  Progress towards OT goals: Progressing toward goals  Acute Rehab OT Goals Patient Stated Goal: to go home OT Goal Formulation: With patient Time For Goal Achievement: 10/10/21 Potential to Achieve Goals: Good  Plan Discharge plan remains appropriate;Frequency remains appropriate    Co-evaluation                  AM-PAC OT "6 Clicks" Daily Activity     Outcome Measure   Help from another person eating meals?: None Help from another person taking care of personal grooming?: None Help from another person toileting, which includes using toliet, bedpan, or urinal?: A Little Help from another person bathing (including washing, rinsing, drying)?: A Little Help from another person to put on and taking off regular upper body clothing?: None Help from another person to put on and taking off regular lower body clothing?: A Little 6 Click Score: 21    End of Session Equipment Utilized During Treatment: Gait belt  OT Visit Diagnosis: Unsteadiness on feet (R26.81)   Activity Tolerance Patient tolerated treatment well   Patient Left in chair;with call bell/phone within reach;with chair alarm set   Nurse Communication Mobility status        Time: 0830-0900 OT Time Calculation (min): 30 min  Charges: OT General Charges $OT Visit: 1 Visit OT Treatments $Self Care/Home Management : 23-37 mins  Corinne Ports  K., COTA/L Acute Rehabilitation Services [phone]   Precious Haws 09/27/2021, 9:41 AM

## 2021-09-27 NOTE — Progress Notes (Addendum)
Pt with d/c orders. Discharge paperwork reviewed with pt and all questions answered. IV removed. Pt escorted out via wheelchair with all belongings to private vehicle.

## 2021-09-27 NOTE — Discharge Summary (Signed)
Physician Discharge Summary  Kerry [name] PRF:[id number] DOB: Jan 13, 1941 DOA: 09/24/2021  PCP: Libby Maw, MD  Admit date: 09/24/2021 Discharge date: 09/27/2021  Admitted From: Home Disposition:  Home  Discharge Condition:Stable CODE STATUS:FULL Diet recommendation: Heart Healthy   Brief/Interim Summary: Patient is a 81 year old female with history of hypertension, hyperlipidemia who presented for the evaluation of confusion from home.  She lives alone.  No report of fall.  On presentation she was hemodynamically stable.  CT head negative for acute intracranial abnormality.  MRI showed no stroke but showed 3 mm subdural hematoma on the right.  She also had right scalp hematoma.  She was also confused on admission.  Currently she is alert and oriented, no new change in mental status.  EEG did not show any seizure activity.  PT/OT recommended no follow-up needed.  She is medically stable for discharge home today.  We recommend to follow-up with neurosurgery as an outpatient  Following problems were addressed during hospitalization:  Subdural hematoma: Plan as above  Hypertension: Takes lisinopril, carvedilol, hydrochlorothiazide at home.  We recommend monitor blood pressure at home.  May need to taper the dose or discontinue the medications.  Follow-up with PCP.  Hyperlipidemia: On Lipitor   Discharge Diagnoses:  Principal Problem:   Encephalopathy acute Active Problems:   Essential hypertension   Hyperlipidemia   Subdural hematoma    Discharge Instructions  Discharge Instructions     Diet - low sodium heart healthy   Complete by: As directed    Discharge instructions   Complete by: As directed    1)Please follow-up with your PCP in a week 2)Monitor your blood pressure at home.  You are on multiple medications.  These medications can make your blood pressure low.  If your blood pressure is low, do not take this medications.  Follow-up with your PCP for discussion  about need of these medications 3) follow-up with neurosurgery in 2 weeks.  Name and number of the provider  has been attached.  Call for appointment   Increase activity slowly   Complete by: As directed       Allergies as of 09/27/2021       Reactions   Lovastatin Other (See Comments)   myalgias   Sudafed [pseudoephedrine Hcl] Other (See Comments)   Hallucinations        Medication List     TAKE these medications    acetaminophen 500 MG tablet Commonly known as: TYLENOL Take 500 mg by mouth every 6 (six) hours as needed for moderate pain or headache.   aspirin 81 MG chewable tablet Chew 81 mg by mouth daily.   atorvastatin 20 MG tablet Commonly known as: LIPITOR TAKE 1 TABLET(20 MG) BY MOUTH DAILY What changed: See the new instructions.   carvedilol 12.5 MG tablet Commonly known as: COREG TAKE 1 TABLET(12.5 MG) BY MOUTH TWICE DAILY What changed: See the new instructions.   fexofenadine 180 MG tablet Commonly known as: ALLEGRA Take 180 mg by mouth every morning.   hydrochlorothiazide 25 MG tablet Commonly known as: HYDRODIURIL Take 1 tablet (25 mg total) by mouth daily. Please keep upcoming appt in February 2023 with Dr. Radford Pax before anymore refills. Thank you Final Attempt What changed: when to take this   lisinopril 20 MG tablet Commonly known as: ZESTRIL Take 1 tablet (20 mg total) by mouth daily. Please keep upcoming appt in February 2023 with Dr. Radford Pax before anymore refills. Thank you Final attempt What changed: when to take this  multivitamin with minerals Tabs tablet Take 1 tablet by mouth daily.   NEURIVA PO Take 1 tablet by mouth daily.   QUNOL ULTRA COQ10 PO Take 1 capsule by mouth daily.   Systane 0.4-0.3 % Soln Generic drug: Polyethyl Glycol-Propyl Glycol Place 1 drop into both eyes daily as needed (dry eyes).   triamcinolone cream 0.1 % Commonly known as: KENALOG Apply 1 application topically 2 (two) times daily as needed (lesions  on head).   VITAMIN C PO Take 1 tablet by mouth daily.   Vitamin D3 50 MCG (2000 UT) capsule Take 2,000 Units by mouth daily.        Follow-up Information     Libby Maw, MD. Schedule an appointment as soon as possible for a visit in 1 week(s).   Specialty: Family Medicine Contact information: [place] [address] [phone]         Consuella Lose, MD. Schedule an appointment as soon as possible for a visit in 2 week(s).   Specialty: Neurosurgery Contact information: [address] [place] [address] [phone]                Allergies  Allergen Reactions   Lovastatin Other (See Comments)    myalgias   Sudafed [Pseudoephedrine Hcl] Other (See Comments)    Hallucinations     Consultations: Discussion was done with neurology, neurosurgery   Procedures/Studies: CT Head Wo Contrast  Result Date: 09/24/2021 CLINICAL DATA:  Mental status change, unknown cause EXAM: CT HEAD WITHOUT CONTRAST TECHNIQUE: Contiguous axial images were obtained from the base of the skull through the vertex without intravenous contrast. RADIATION DOSE REDUCTION: This exam was performed according to the departmental dose-optimization program which includes automated exposure control, adjustment of the mA and/or kV according to patient size and/or use of iterative reconstruction technique. COMPARISON:  06/20/2019 FINDINGS: Brain: No evidence of acute infarction, hemorrhage, cerebral edema, mass, mass effect, or midline shift. No hydrocephalus or extra-axial fluid collection. Vascular: No hyperdense vessel. Skull: Normal. Negative for fracture or focal lesion. Sinuses/Orbits: No acute finding. Other: Trace fluid in right mastoid air cells. Small right frontal scalp hematoma. IMPRESSION: IMPRESSION No acute intracranial process. No etiology is seen for the patient's altered mental status. Electronically Signed   By: Merilyn Baba  M.D.   On: 09/24/2021 19:23   MR BRAIN WO CONTRAST  Result Date: 09/24/2021 CLINICAL DATA:  Acute neurologic deficits EXAM: MRI HEAD WITHOUT CONTRAST TECHNIQUE: Multiplanar, multiecho pulse sequences of the brain and surrounding structures were obtained without intravenous contrast. COMPARISON:  06/20/2019 FINDINGS: Brain: There is a small right hemispheric subdural hematoma measuring 3 mm. No mass effect or midline shift. No acute infarct. There is multifocal hyperintense T2-weighted signal within the white matter. Parenchymal volume and CSF spaces are normal. The midline structures are normal. Old, punctate left cerebellar infarct. Vascular: Major flow voids are preserved. Skull and upper cervical spine: Right scalp subgaleal hematoma. Sinuses/Orbits:No paranasal sinus fluid levels or advanced mucosal thickening. No mastoid or middle ear effusion. Normal orbits. IMPRESSION: 1. Small right hemispheric subdural hematoma measuring 3 mm. No mass effect or midline shift. 2. Findings of chronic small vessel ischemia. 3. Right scalp hematoma. Critical Value/emergent results were called by telephone at the time of interpretation on 09/24/2021 at 10:57 pm to provider [facility] , who verbally acknowledged these results. Electronically Signed   By: Ulyses Jarred M.D.   On: 09/24/2021 22:57   DG Chest [facility]  Result Date: 09/24/2021 CLINICAL DATA:  Chest pain and confusion for 1 day EXAM: PORTABLE CHEST 1 VIEW COMPARISON:  02/22/2018 FINDINGS: Heart is at the upper limits of normal. The lungs are clear bilaterally. No focal infiltrate or effusion is seen. No bony abnormality is noted. IMPRESSION: No acute abnormality noted. Electronically Signed   By: Inez Catalina M.D.   On: 09/24/2021 19:28   EEG adult  Result Date: 09/26/2021 Tsosie Billing, MD     09/26/2021  9:43 PM [facility] Routine EEG Report Duration: 23 min Patient Name:   Conan Bowens Date  of Birth:   [birth year] Identification Number:   MRN - [id number] Date of Study:   09/26/2021 12:23:40 Indication: Encephalopathy, Technical Summary: A routine 20 channel electroencephalogram using the international 10-20 system of electrode placement was performed. Background: 7-8 Hz, Posterior dominant rhythm that attenuated with eye Opening States      Awake Abnormalities Generalized Slowing: Diffuse generalized slowing. There were runs of Frontal intermittent rhythmic delta activity. Activation Procedures Hyperventilation: Not performed Photic Stimulation: Not performed Classification: Abnormal : Diagnosis: This is an abnormal EEG due to intermittent bilateral slowing concerning for non-specific encephalopathy. Dr Tsosie Billing [facility] [id number] Case [id number]     Subjective: Patient seen and examined the bedside this morning.  Hemodynamically stable for discharge today.  Discharge Exam: Vitals:   09/27/21 0314 09/27/21 0721  BP: (!) 156/61 (!) 147/56  Pulse: 83 76  Resp: 15 20  Temp: 98.4 F (36.9 C) 98.3 F (36.8 C)  SpO2: 92% 93%   Vitals:   09/26/21 1932 09/26/21 2314 09/27/21 0314 09/27/21 0721  BP: (!) 140/59 (!) 124/47 (!) 156/61 (!) 147/56  Pulse: 71 61 83 76  Resp: 17 11 15 20   Temp: 97.6 F (36.4 C) 98.4 F (36.9 C) 98.4 F (36.9 C) 98.3 F (36.8 C)  TempSrc: Oral Oral Oral Oral  SpO2: 93% 91% 92% 93%  Weight:      Height:        General: Pt is alert, awake, not in acute distress Cardiovascular: RRR, S1/S2 +, no rubs, no gallops Respiratory: CTA bilaterally, no wheezing, no rhonchi Abdominal: Soft, NT, ND, bowel sounds + Extremities: no edema, no cyanosis    The results of significant diagnostics from this hospitalization (including imaging, microbiology, ancillary and laboratory) are listed below for reference.     Microbiology: Recent Results (from the past 240 hour(s))  Resp Panel by RT-PCR (Flu A&B, Covid) Nasopharyngeal Swab      Status: None   Collection Time: 09/24/21 11:16 PM   Specimen: Nasopharyngeal Swab; Nasopharyngeal(NP) swabs in vial transport medium  Result Value Ref Range Status   SARS Coronavirus 2 by RT PCR NEGATIVE NEGATIVE Final    Comment: (NOTE) SARS-CoV-2 target nucleic acids are NOT DETECTED.  The SARS-CoV-2 RNA is generally detectable in upper respiratory specimens during the acute phase of infection. The lowest concentration of SARS-CoV-2 viral copies this assay can detect is 138 copies/mL. A negative result does not preclude SARS-Cov-2 infection and should not be used as the sole basis for treatment or other patient management decisions. A negative result may occur with  improper specimen collection/handling, submission of specimen other than nasopharyngeal swab, presence of viral mutation(s) within the areas targeted by this assay, and inadequate number of viral copies(<138 copies/mL). A negative result must be combined with clinical observations, patient history, and epidemiological information. The expected result is Negative.  Fact Sheet for Patients:  EntrepreneurPulse.com.au  Fact Sheet for  Healthcare Providers:  IncredibleEmployment.be  This test is no t yet approved or cleared by the [place] and  has been authorized for detection and/or diagnosis of SARS-CoV-2 by FDA under an Emergency Use Authorization (EUA). This EUA will remain  in effect (meaning this test can be used) for the duration of the COVID-19 declaration under Section 564(b)(1) of the Act, 21 U.S.C.section 360bbb-3(b)(1), unless the authorization is terminated  or revoked sooner.       Influenza A by PCR NEGATIVE NEGATIVE Final   Influenza B by PCR NEGATIVE NEGATIVE Final    Comment: (NOTE) The Xpert Xpress SARS-CoV-2/FLU/RSV plus assay is intended as an aid in the diagnosis of influenza from Nasopharyngeal swab specimens and should not be used as a sole basis  for treatment. Nasal washings and aspirates are unacceptable for Xpert Xpress SARS-CoV-2/FLU/RSV testing.  Fact Sheet for Patients: EntrepreneurPulse.com.au  Fact Sheet for Healthcare Providers: IncredibleEmployment.be  This test is not yet approved or cleared by the [place] FDA and has been authorized for detection and/or diagnosis of SARS-CoV-2 by FDA under an Emergency Use Authorization (EUA). This EUA will remain in effect (meaning this test can be used) for the duration of the COVID-19 declaration under Section 564(b)(1) of the Act, 21 U.S.C. section 360bbb-3(b)(1), unless the authorization is terminated or revoked.  Performed at [facility], [place] [address], [place], [place] [address]   MRSA Next Gen by PCR, Nasal     Status: None   Collection Time: 09/25/21  9:38 AM   Specimen: Nasal Mucosa; Nasal Swab  Result Value Ref Range Status   MRSA by PCR Next Gen NOT DETECTED NOT DETECTED Final    Comment: (NOTE) The GeneXpert MRSA Assay (FDA approved for NASAL specimens only), is one component of a comprehensive MRSA colonization surveillance program. It is not intended to diagnose MRSA infection nor to guide or monitor treatment for MRSA infections. Test performance is not FDA approved in patients less than 68 years old. Performed at [facility], [place] [address] [place], [place] [address]      Labs: BNP (last 3 results) No results for input(s): BNP in the last 8760 hours. Basic Metabolic Panel: Recent Labs  Lab 09/24/21 1935 09/25/21 0500  NA 136 137  K 4.3 3.8  CL 105 106  CO2 24 24  GLUCOSE 112* 95  BUN 23 23  CREATININE 0.81 0.71  CALCIUM 9.2 8.7*   Liver Function Tests: Recent Labs  Lab 09/24/21 1935  AST 24  ALT 21  ALKPHOS 98  BILITOT 0.6  PROT 7.3  ALBUMIN 3.7   No results for input(s): LIPASE, AMYLASE in the last 168 hours. No results for input(s): AMMONIA in the last 168  hours. CBC: Recent Labs  Lab 09/24/21 1935 09/25/21 0500  WBC 12.5* 8.9  HGB 11.9* 10.1*  HCT 35.7* 31.1*  MCV 94.4 95.1  PLT 225 185   Cardiac Enzymes: No results for input(s): CKTOTAL, CKMB, CKMBINDEX, TROPONINI in the last 168 hours. BNP: Invalid input(s): POCBNP CBG: No results for input(s): GLUCAP in the last 168 hours. D-Dimer No results for input(s): DDIMER in the last 72 hours. Hgb A1c No results for input(s): HGBA1C in the last 72 hours. Lipid Profile No results for input(s): CHOL, HDL, LDLCALC, TRIG, CHOLHDL, LDLDIRECT in the last 72 hours. Thyroid function studies No results for input(s): TSH, T4TOTAL, T3FREE, THYROIDAB in the last 72 hours.  Invalid input(s): FREET3 Anemia work up No results for input(s):  VITAMINB12, FOLATE, FERRITIN, TIBC, IRON, RETICCTPCT in the last 72 hours. Urinalysis    Component Value Date/Time   COLORURINE YELLOW 09/24/2021 [place] 09/24/2021 1854   LABSPEC 1.017 09/24/2021 1854   PHURINE 5.0 09/24/2021 1854   GLUCOSEU NEGATIVE 09/24/2021 1854   GLUCOSEU NEGATIVE 03/12/2021 0816   HGBUR NEGATIVE 09/24/2021 1854   BILIRUBINUR NEGATIVE 09/24/2021 1854   BILIRUBINUR negative 02/22/2018 1741   KETONESUR NEGATIVE 09/24/2021 1854   PROTEINUR NEGATIVE 09/24/2021 1854   UROBILINOGEN 0.2 03/12/2021 0816   NITRITE NEGATIVE 09/24/2021 1854   LEUKOCYTESUR NEGATIVE 09/24/2021 1854   Sepsis Labs Invalid input(s): PROCALCITONIN,  WBC,  LACTICIDVEN Microbiology Recent Results (from the past 240 hour(s))  Resp Panel by RT-PCR (Flu A&B, Covid) Nasopharyngeal Swab     Status: None   Collection Time: 09/24/21 11:16 PM   Specimen: Nasopharyngeal Swab; Nasopharyngeal(NP) swabs in vial transport medium  Result Value Ref Range Status   SARS Coronavirus 2 by RT PCR NEGATIVE NEGATIVE Final    Comment: (NOTE) SARS-CoV-2 target nucleic acids are NOT DETECTED.  The SARS-CoV-2 RNA is generally detectable in upper  respiratory specimens during the acute phase of infection. The lowest concentration of SARS-CoV-2 viral copies this assay can detect is 138 copies/mL. A negative result does not preclude SARS-Cov-2 infection and should not be used as the sole basis for treatment or other patient management decisions. A negative result may occur with  improper specimen collection/handling, submission of specimen other than nasopharyngeal swab, presence of viral mutation(s) within the areas targeted by this assay, and inadequate number of viral copies(<138 copies/mL). A negative result must be combined with clinical observations, patient history, and epidemiological information. The expected result is Negative.  Fact Sheet for Patients:  EntrepreneurPulse.com.au  Fact Sheet for Healthcare Providers:  IncredibleEmployment.be  This test is no t yet approved or cleared by the [place] FDA and  has been authorized for detection and/or diagnosis of SARS-CoV-2 by FDA under an Emergency Use Authorization (EUA). This EUA will remain  in effect (meaning this test can be used) for the duration of the COVID-19 declaration under Section 564(b)(1) of the Act, 21 U.S.C.section 360bbb-3(b)(1), unless the authorization is terminated  or revoked sooner.       Influenza A by PCR NEGATIVE NEGATIVE Final   Influenza B by PCR NEGATIVE NEGATIVE Final    Comment: (NOTE) The Xpert Xpress SARS-CoV-2/FLU/RSV plus assay is intended as an aid in the diagnosis of influenza from Nasopharyngeal swab specimens and should not be used as a sole basis for treatment. Nasal washings and aspirates are unacceptable for Xpert Xpress SARS-CoV-2/FLU/RSV testing.  Fact Sheet for Patients: EntrepreneurPulse.com.au  Fact Sheet for Healthcare Providers: IncredibleEmployment.be  This test is not yet approved or cleared by the [place] FDA and has been  authorized for detection and/or diagnosis of SARS-CoV-2 by FDA under an Emergency Use Authorization (EUA). This EUA will remain in effect (meaning this test can be used) for the duration of the COVID-19 declaration under Section 564(b)(1) of the Act, 21 U.S.C. section 360bbb-3(b)(1), unless the authorization is terminated or revoked.  Performed at [facility], [place] [address] [place], [place] [address]   MRSA Next Gen by PCR, Nasal     Status: None   Collection Time: 09/25/21  9:38 AM   Specimen: Nasal Mucosa; Nasal Swab  Result Value Ref Range Status   MRSA by PCR Next Gen NOT DETECTED NOT DETECTED Final    Comment: (NOTE) The GeneXpert  MRSA Assay (FDA approved for NASAL specimens only), is one component of a comprehensive MRSA colonization surveillance program. It is not intended to diagnose MRSA infection nor to guide or monitor treatment for MRSA infections. Test performance is not FDA approved in patients less than 72 years old. Performed at [facility], [place] [address], [place], [place] [address]     Please note: You were cared for by a hospitalist during your hospital stay. Once you are discharged, your primary care physician will handle any further medical issues. Please note that NO REFILLS for any discharge medications will be authorized once you are discharged, as it is imperative that you return to your primary care physician (or establish a relationship with a primary care physician if you do not have one) for your post hospital discharge needs so that they can reassess your need for medications and monitor your lab values.    Time coordinating discharge: 40 minutes  SIGNED:   Shelly Coss, MD  [facility] 09/27/2021, 10:13 AM Pager [phone]  If 7PM-7AM, please contact night-coverage [email or web address] Password [email or web address]

## 2021-09-27 NOTE — TOC CAGE-AID Note (Signed)
Transition of Care [facility]) - CAGE-AID Screening   Patient Details  Name: Kerry [name] MRN: [id number] Date of Birth: 29-Jun-1941  [facility] CM/SW Contact:    Bethann Berkshire, [place] Phone Number: 09/27/2021, 11:28 AM   Clinical Narrative:  CAGE-AID completed; score of 0; Patient does not drink alcohol or use any other substances.  CAGE-AID Screening:    Have You Ever Felt You Ought to Cut Down on Your Drinking or Drug Use?: No Have People Annoyed You By Critizing Your Drinking Or Drug Use?: No Have You Felt Bad Or Guilty About Your Drinking Or Drug Use?: No Have You Ever Had a Drink or Used Drugs First Thing In The Morning to Steady Your Nerves or to Get Rid of a Hangover?: No CAGE-AID Score: 0  Substance Abuse Education Offered: No

## 2021-09-28 ENCOUNTER — Ambulatory Visit (INDEPENDENT_AMBULATORY_CARE_PROVIDER_SITE_OTHER): Payer: Medicare Other | Admitting: Family Medicine

## 2021-09-28 ENCOUNTER — Other Ambulatory Visit: Payer: Self-pay

## 2021-09-28 ENCOUNTER — Encounter: Payer: Self-pay | Admitting: Family Medicine

## 2021-09-28 VITALS — BP 144/60 | HR 77 | Temp 97.1°F | Ht 63.0 in | Wt 193.0 lb

## 2021-09-28 DIAGNOSIS — I10 Essential (primary) hypertension: Secondary | ICD-10-CM

## 2021-09-28 DIAGNOSIS — S0093XD Contusion of unspecified part of head, subsequent encounter: Secondary | ICD-10-CM

## 2021-09-28 DIAGNOSIS — S065XAA Traumatic subdural hemorrhage with loss of consciousness status unknown, initial encounter: Secondary | ICD-10-CM

## 2021-09-28 DIAGNOSIS — G44329 Chronic post-traumatic headache, not intractable: Secondary | ICD-10-CM

## 2021-09-28 DIAGNOSIS — S0093XA Contusion of unspecified part of head, initial encounter: Secondary | ICD-10-CM | POA: Insufficient documentation

## 2021-09-28 DIAGNOSIS — Z09 Encounter for follow-up examination after completed treatment for conditions other than malignant neoplasm: Secondary | ICD-10-CM

## 2021-09-28 NOTE — Progress Notes (Addendum)
Established Patient Office Visit  Subjective:  Patient ID: Kerry [name], female    DOB: Dec 03, 1940  Age: 81 y.o. MRN: [id number]  CC:  Chief Complaint  Patient presents with   Hospitalization [facility] follow up, had a fall on 09/24/21 was told BP could have dropped due to medications would like to go over meds.     HPI [name] presents for hospital discharge follow-up status post fall at home on the 13th.  She does not recall palpitations prior to the event but vaguely remembers being lightheaded.  There was no spinning sensation.  No prominent history of palpitations.  She might have them twice a year they are asymptomatic.  Does not recall what part of the house she was in when she fell.  Her daughter took her to the hospital and she remembers waking up on the gurney after that.  It was felt as though her blood pressure had dropped.  She has been treated for hypertension with lisinopril 20 and HCTZ 25 and Coreg.  She has been holding the lisinopril and HCTZ.  She brings in her cuff that compares with ours today.  Denies any further lightheadedness.  She is having a headache just above the frontal area.  It is constant.  Does not feel it is worsening or improving.  She has been taking Tylenol for it MRI had shown a small subdural 3 mLl hematoma contra coo to bruising on face.  Follow-up with neurosurgery as scheduled for tomorrow.  Past Medical History:  Diagnosis Date   Allergy    Anemia    in her 20's   Aortic insufficiency    mild by echo 10/2020   Arthritis    not dx'd- just per pt    Asthma    AS A CHILD   Cancer ([place])    endometrial cancer with hysterectomy in 1987   Cancer ([place])    melanoma on face    Cataract    removed both eyes    DCM (dilated cardiomyopathy) ([place])    EF 50-55%   GERD (gastroesophageal reflux disease)    occ has with diet    History of syncope    HTN (hypertension)    Hyperlipidemia    OSA on CPAP    TIA (transient ischemic  attack)     Past Surgical History:  Procedure Laterality Date   ABDOMINAL HYSTERECTOMY     ANKLE FRACTURE SURGERY     APPENDECTOMY     CATARACT EXTRACTION, BILATERAL     CHOLECYSTECTOMY     COLONOSCOPY  08/01/2007   Dr Henrene Pastor    DENTAL SURGERY     ovary removal     right leg  fracture surgery       Family History  Problem Relation Age of Onset   Mitral valve prolapse Daughter    Heart disease Mother    Kidney failure Mother    Diabetes Mother    Hyperlipidemia Mother    Hypertension Mother    Uterine cancer Mother    Dementia Father    Diabetes Sister    Multiple sclerosis Sister    Diabetes Paternal Grandmother    Throat cancer Brother        smoker   Colon cancer Neg Hx    Colon polyps Neg Hx    Esophageal cancer Neg Hx    Rectal cancer Neg Hx    Stomach cancer Neg Hx     Social History  Socioeconomic History   Marital status: Widowed    Spouse name: Not on file   Number of children: 2   Years of education: Not on file   Highest education level: Not on file  Occupational History   Not on file  Tobacco Use   Smoking status: Never   Smokeless tobacco: Never  Vaping Use   Vaping Use: Never used  Substance and Sexual Activity   Alcohol use: No   Drug use: No   Sexual activity: Not Currently  Other Topics Concern   Not on file  Social History Narrative   Lives at home alone   1 cup of coffee per day    Works at [employer] Strain: Not on file  Food Insecurity: Not on file  Transportation Needs: Not on file  Physical Activity: Not on file  Stress: Stress Concern Present   Feeling of Stress : To some extent  Social Connections: Moderately Isolated   Frequency of Communication with Friends and Family: More than three times a week   Frequency of Social Gatherings with Friends and Family: More than three times a week   Attends Religious Services: More than 4 times per year   Active Member of  [employer] or Organizations: No   Attends Archivist Meetings: Never   Marital Status: Widowed  Intimate Partner Violence: Not on file    Outpatient Medications Prior to Visit  Medication Sig Dispense Refill   acetaminophen (TYLENOL) 500 MG tablet Take 500 mg by mouth every 6 (six) hours as needed for moderate pain or headache.      Ascorbic Acid (VITAMIN C PO) Take 1 tablet by mouth daily.     aspirin 81 MG chewable tablet Chew 81 mg by mouth daily.     atorvastatin (LIPITOR) 20 MG tablet TAKE 1 TABLET(20 MG) BY MOUTH DAILY (Patient taking differently: Take 20 mg by mouth at bedtime.) 90 tablet 3   carvedilol (COREG) 12.5 MG tablet TAKE 1 TABLET(12.5 MG) BY MOUTH TWICE DAILY (Patient taking differently: Take 12.5 mg by mouth 2 (two) times daily with a meal.) 180 tablet 0   Cholecalciferol (VITAMIN D3) 2000 units capsule Take 2,000 Units by mouth daily.     Coenzyme Q10-Vitamin E (QUNOL ULTRA COQ10 PO) Take 1 capsule by mouth daily.     fexofenadine (ALLEGRA) 180 MG tablet Take 180 mg by mouth every morning.     Misc Natural Products (NEURIVA PO) Take 1 tablet by mouth daily.     Multiple Vitamin (MULTIVITAMIN WITH MINERALS) TABS tablet Take 1 tablet by mouth daily.     Polyethyl Glycol-Propyl Glycol (SYSTANE) 0.4-0.3 % SOLN Place 1 drop into both eyes daily as needed (dry eyes).     triamcinolone cream (KENALOG) 0.1 % Apply 1 application topically 2 (two) times daily as needed (lesions on head).     hydrochlorothiazide (HYDRODIURIL) 25 MG tablet Take 1 tablet (25 mg total) by mouth daily. Please keep upcoming appt in February 2023 with Dr. Radford Pax before anymore refills. Thank you Final Attempt (Patient not taking: Reported on 09/28/2021) 90 tablet 0   lisinopril (ZESTRIL) 20 MG tablet Take 1 tablet (20 mg total) by mouth daily. Please keep upcoming appt in February 2023 with Dr. Radford Pax before anymore refills. Thank you Final attempt (Patient not taking: Reported on 09/28/2021) 90 tablet 3    No facility-administered medications prior to visit.    Allergies  Allergen Reactions  Lovastatin Other (See Comments)    myalgias   Sudafed [Pseudoephedrine Hcl] Other (See Comments)    Hallucinations     ROS Review of Systems  Constitutional:  Negative for chills, diaphoresis, fatigue, fever and unexpected weight change.  HENT: Negative.    Eyes:  Negative for photophobia and visual disturbance.  Respiratory: Negative.  Negative for shortness of breath and wheezing.   Cardiovascular:  Negative for chest pain and palpitations.  Gastrointestinal: Negative.   Genitourinary: Negative.   Musculoskeletal:  Negative for neck pain and neck stiffness.  Skin:  Positive for color change.  Neurological:  Positive for headaches. Negative for dizziness, speech difficulty, weakness and light-headedness.  Hematological:  Does not bruise/bleed easily.  Psychiatric/Behavioral: Negative.       Objective:    Physical Exam Vitals and nursing note reviewed.  Constitutional:      General: She is not in acute distress.    Appearance: Normal appearance. She is not ill-appearing, toxic-appearing or diaphoretic.  HENT:     Head: Normocephalic.     Right Ear: Tympanic membrane, ear canal and external ear normal.     Left Ear: Tympanic membrane, ear canal and external ear normal.     Mouth/Throat:     Mouth: Mucous membranes are dry.     Pharynx: Oropharynx is clear. No oropharyngeal exudate or posterior oropharyngeal erythema.  Eyes:     General: No scleral icterus.       Right eye: No discharge.        Left eye: No discharge.     Extraocular Movements: Extraocular movements intact.     Conjunctiva/sclera: Conjunctivae normal.  Cardiovascular:     Rate and Rhythm: Normal rate and regular rhythm.  Pulmonary:     Effort: Pulmonary effort is normal.     Breath sounds: Normal breath sounds.  Abdominal:     General: Bowel sounds are normal.  Musculoskeletal:     Cervical back: No  rigidity or tenderness.  Lymphadenopathy:     Cervical: No cervical adenopathy.  Skin:    General: Skin is warm and dry.     Findings: Ecchymosis present.       Neurological:     Mental Status: She is alert and oriented to person, place, and time.     Cranial Nerves: No dysarthria or facial asymmetry.  Psychiatric:        Mood and Affect: Mood normal.        Behavior: Behavior normal.    BP (!) 144/60 (BP Location: Left Arm, Patient Position: Sitting, Cuff Size: Large)    Pulse 77    Temp (!) 97.1 F (36.2 C) (Temporal)    Ht 5\' 3"  (1.6 m)    Wt 193 lb (87.5 kg)    SpO2 96%    BMI 34.19 kg/m  Wt Readings from Last 3 Encounters:  09/28/21 193 lb (87.5 kg)  09/24/21 193 lb 3.2 oz (87.6 kg)  09/14/21 193 lb 3.2 oz (87.6 kg)     Health Maintenance Due  Topic Date Due   Hepatitis C Screening  Never done   TETANUS/TDAP  Never done   Zoster Vaccines- Shingrix (1 of 2) Never done    There are no preventive care reminders to display for this patient.  Lab Results  Component Value Date   TSH 2.33 03/12/2021   Lab Results  Component Value Date   WBC 8.9 09/25/2021   HGB 10.1 (L) 09/25/2021   HCT 31.1 (L) 09/25/2021  MCV 95.1 09/25/2021   PLT 185 09/25/2021   Lab Results  Component Value Date   NA 137 09/25/2021   K 3.8 09/25/2021   CO2 24 09/25/2021   GLUCOSE 95 09/25/2021   BUN 23 09/25/2021   CREATININE 0.71 09/25/2021   BILITOT 0.6 09/24/2021   ALKPHOS 98 09/24/2021   AST 24 09/24/2021   ALT 21 09/24/2021   PROT 7.3 09/24/2021   ALBUMIN 3.7 09/24/2021   CALCIUM 8.7 (L) 09/25/2021   ANIONGAP 7 09/25/2021   GFR 69.89 09/14/2021   Lab Results  Component Value Date   CHOL 141 09/23/2021   Lab Results  Component Value Date   HDL 54.20 09/23/2021   Lab Results  Component Value Date   LDLCALC 69 09/23/2021   Lab Results  Component Value Date   TRIG 92.0 09/23/2021   Lab Results  Component Value Date   CHOLHDL 3 09/23/2021   Lab Results   Component Value Date   HGBA1C 5.6 06/21/2019      Assessment & Plan:   Problem List Items Addressed This Visit       Cardiovascular and Mediastinum   Essential hypertension - Primary     Nervous and Auditory   Subdural hematoma     Other   Hospital discharge follow-up   Traumatic ecchymosis of head   Chronic post-traumatic headache, not intractable    No orders of the defined types were placed in this encounter.   Follow-up: Return in about 2 weeks (around 10/12/2021), or continue to hold Lisinopril and HCTZ. Check and record BPs..  Hydrate well. Keep appointment with neurosurgery for tomorrow.   Libby Maw, MD

## 2021-09-29 ENCOUNTER — Telehealth: Payer: Self-pay

## 2021-09-29 DIAGNOSIS — S065XAA Traumatic subdural hemorrhage with loss of consciousness status unknown, initial encounter: Secondary | ICD-10-CM | POA: Diagnosis not present

## 2021-09-29 NOTE — Telephone Encounter (Signed)
Transition Care Management Unsuccessful Follow-up Telephone Call  Date of discharge and from where:  09/27/2021  [facility]   Attempts:  1st Attempt  Reason for unsuccessful TCM follow-up call:  Unable to leave message

## 2021-09-30 DIAGNOSIS — G4733 Obstructive sleep apnea (adult) (pediatric): Secondary | ICD-10-CM | POA: Diagnosis not present

## 2021-10-04 ENCOUNTER — Inpatient Hospital Stay: Payer: Medicare Other | Admitting: Family Medicine

## 2021-10-08 ENCOUNTER — Other Ambulatory Visit: Payer: Self-pay | Admitting: Cardiology

## 2021-10-12 ENCOUNTER — Ambulatory Visit (INDEPENDENT_AMBULATORY_CARE_PROVIDER_SITE_OTHER): Payer: Medicare Other | Admitting: Family Medicine

## 2021-10-12 ENCOUNTER — Other Ambulatory Visit: Payer: Self-pay

## 2021-10-12 ENCOUNTER — Encounter: Payer: Self-pay | Admitting: Family Medicine

## 2021-10-12 VITALS — BP 148/70 | HR 73 | Temp 97.4°F | Ht 63.0 in | Wt 195.6 lb

## 2021-10-12 DIAGNOSIS — I10 Essential (primary) hypertension: Secondary | ICD-10-CM

## 2021-10-12 DIAGNOSIS — R5383 Other fatigue: Secondary | ICD-10-CM

## 2021-10-12 MED ORDER — CYANOCOBALAMIN 1000 MCG/ML IJ SOLN
1000.0000 ug | Freq: Once | INTRAMUSCULAR | Status: AC
  Administered 2021-10-12: 1000 ug via INTRAMUSCULAR

## 2021-10-12 NOTE — Progress Notes (Signed)
Established Patient Office Visit  Subjective:  Patient ID: Kerry [name], female    DOB: 04-27-1941  Age: 81 y.o. MRN: [id number]  CC:  Chief Complaint  Patient presents with   Follow-up    2 week follow up on BP and medication, patient states that she is still feeling a little exhausted.     HPI Kerry [name] presents for follow-up of hypertension.  She has been holding the lisinopril and HCTZ and taking the carvedilol only.  Blood pressures have been running in the upper 140s over upper 60s.  She has been a little fatigued.  Continues to be responsible for all of her ADLs but sometimes has to stop and rest.  There is swelling in her lower extremities that is worse since she discontinued the HCTZ.  She has had no further episodes of lightheadedness dizziness.  Headache is mostly resolved.  Past Medical History:  Diagnosis Date   Allergy    Anemia    in her 20's   Aortic insufficiency    mild by echo 10/2020   Arthritis    not dx'd- just per pt    Asthma    AS A CHILD   Cancer ([place])    endometrial cancer with hysterectomy in 1987   Cancer ([place])    melanoma on face    Cataract    removed both eyes    DCM (dilated cardiomyopathy) ([place])    EF 50-55%   GERD (gastroesophageal reflux disease)    occ has with diet    History of syncope    HTN (hypertension)    Hyperlipidemia    OSA on CPAP    TIA (transient ischemic attack)     Past Surgical History:  Procedure Laterality Date   ABDOMINAL HYSTERECTOMY     ANKLE FRACTURE SURGERY     APPENDECTOMY     CATARACT EXTRACTION, BILATERAL     CHOLECYSTECTOMY     COLONOSCOPY  08/01/2007   Dr Henrene Pastor    DENTAL SURGERY     ovary removal     right leg  fracture surgery       Family History  Problem Relation Age of Onset   Mitral valve prolapse Daughter    Heart disease Mother    Kidney failure Mother    Diabetes Mother    Hyperlipidemia Mother    Hypertension Mother    Uterine cancer Mother    Dementia Father     Diabetes Sister    Multiple sclerosis Sister    Diabetes Paternal Grandmother    Throat cancer Brother        smoker   Colon cancer Neg Hx    Colon polyps Neg Hx    Esophageal cancer Neg Hx    Rectal cancer Neg Hx    Stomach cancer Neg Hx     Social History   Socioeconomic History   Marital status: Widowed    Spouse name: Not on file   Number of children: 2   Years of education: Not on file   Highest education level: Not on file  Occupational History   Not on file  Tobacco Use   Smoking status: Never   Smokeless tobacco: Never  Vaping Use   Vaping Use: Never used  Substance and Sexual Activity   Alcohol use: No   Drug use: No   Sexual activity: Not Currently  Other Topics Concern   Not on file  Social History Narrative   Lives at home alone  1 cup of coffee per day    Works at [employer] Strain: Not on file  Food Insecurity: Not on file  Transportation Needs: Not on file  Physical Activity: Not on file  Stress: Stress Concern Present   Feeling of Stress : To some extent  Social Connections: Moderately Isolated   Frequency of Communication with Friends and Family: More than three times a week   Frequency of Social Gatherings with Friends and Family: More than three times a week   Attends Religious Services: More than 4 times per year   Active Member of [employer] or Organizations: No   Attends Archivist Meetings: Never   Marital Status: Widowed  Intimate Partner Violence: Not on file    Outpatient Medications Prior to Visit  Medication Sig Dispense Refill   acetaminophen (TYLENOL) 500 MG tablet Take 500 mg by mouth every 6 (six) hours as needed for moderate pain or headache.      Ascorbic Acid (VITAMIN C PO) Take 1 tablet by mouth daily.     aspirin 81 MG chewable tablet Chew 81 mg by mouth daily.     atorvastatin (LIPITOR) 20 MG tablet TAKE 1 TABLET(20 MG) BY MOUTH DAILY 90 tablet 3    carvedilol (COREG) 12.5 MG tablet TAKE 1 TABLET(12.5 MG) BY MOUTH TWICE DAILY (Patient taking differently: Take 12.5 mg by mouth 2 (two) times daily with a meal.) 180 tablet 0   Cholecalciferol (VITAMIN D3) 2000 units capsule Take 2,000 Units by mouth daily.     Coenzyme Q10-Vitamin E (QUNOL ULTRA COQ10 PO) Take 1 capsule by mouth daily.     fexofenadine (ALLEGRA) 180 MG tablet Take 180 mg by mouth every morning.     Misc Natural Products (NEURIVA PO) Take 1 tablet by mouth daily.     Multiple Vitamin (MULTIVITAMIN WITH MINERALS) TABS tablet Take 1 tablet by mouth daily.     Polyethyl Glycol-Propyl Glycol (SYSTANE) 0.4-0.3 % SOLN Place 1 drop into both eyes daily as needed (dry eyes).     triamcinolone cream (KENALOG) 0.1 % Apply 1 application topically 2 (two) times daily as needed (lesions on head).     hydrochlorothiazide (HYDRODIURIL) 25 MG tablet Take 1 tablet (25 mg total) by mouth daily. Please keep upcoming appt in February 2023 with Dr. Radford Pax before anymore refills. Thank you Final Attempt (Patient not taking: Reported on 10/12/2021) 90 tablet 0   lisinopril (ZESTRIL) 20 MG tablet Take 1 tablet (20 mg total) by mouth daily. Please keep upcoming appt in February 2023 with Dr. Radford Pax before anymore refills. Thank you Final attempt (Patient not taking: Reported on 09/28/2021) 90 tablet 3   No facility-administered medications prior to visit.    Allergies  Allergen Reactions   Lovastatin Other (See Comments)    myalgias   Sudafed [Pseudoephedrine Hcl] Other (See Comments)    Hallucinations     ROS Review of Systems  Constitutional:  Negative for diaphoresis, fever and unexpected weight change.  HENT: Negative.    Respiratory: Negative.    Cardiovascular: Negative.   Gastrointestinal: Negative.   Genitourinary: Negative.   Neurological:  Negative for dizziness, facial asymmetry, speech difficulty, weakness, light-headedness and headaches.     Objective:    Physical Exam Vitals  and nursing note reviewed.  Constitutional:      General: She is not in acute distress.    Appearance: Normal appearance. She is not ill-appearing, toxic-appearing or diaphoretic.  HENT:     Head: Normocephalic and atraumatic.     Right Ear: External ear normal.     Left Ear: External ear normal.     Mouth/Throat:     Mouth: Mucous membranes are moist.     Pharynx: Oropharynx is clear. No oropharyngeal exudate.  Eyes:     General: No scleral icterus.       Right eye: No discharge.        Left eye: No discharge.     Extraocular Movements: Extraocular movements intact.     Conjunctiva/sclera: Conjunctivae normal.     Pupils: Pupils are equal, round, and reactive to light.  Neck:     Vascular: No carotid bruit.  Cardiovascular:     Rate and Rhythm: Normal rate and regular rhythm.  Pulmonary:     Effort: Pulmonary effort is normal.     Breath sounds: Normal breath sounds.  Musculoskeletal:     Cervical back: No rigidity or tenderness.       Legs:  Lymphadenopathy:     Cervical: No cervical adenopathy.  Neurological:     Mental Status: She is alert and oriented to person, place, and time.  Psychiatric:        Mood and Affect: Mood normal.        Behavior: Behavior normal.    BP (!) 148/70 (BP Location: Left Arm, Patient Position: Sitting, Cuff Size: Large)    Pulse 73    Temp (!) 97.4 F (36.3 C) (Temporal)    Ht 5\' 3"  (1.6 m)    Wt 195 lb 9.6 oz (88.7 kg)    SpO2 95%    BMI 34.65 kg/m  Wt Readings from Last 3 Encounters:  10/12/21 195 lb 9.6 oz (88.7 kg)  09/28/21 193 lb (87.5 kg)  09/24/21 193 lb 3.2 oz (87.6 kg)     Health Maintenance Due  Topic Date Due   Hepatitis C Screening  Never done   TETANUS/TDAP  Never done   Zoster Vaccines- Shingrix (1 of 2) Never done    There are no preventive care reminders to display for this patient.  Lab Results  Component Value Date   TSH 2.33 03/12/2021   Lab Results  Component Value Date   WBC 8.9 09/25/2021   HGB 10.1  (L) 09/25/2021   HCT 31.1 (L) 09/25/2021   MCV 95.1 09/25/2021   PLT 185 09/25/2021   Lab Results  Component Value Date   NA 137 09/25/2021   K 3.8 09/25/2021   CO2 24 09/25/2021   GLUCOSE 95 09/25/2021   BUN 23 09/25/2021   CREATININE 0.71 09/25/2021   BILITOT 0.6 09/24/2021   ALKPHOS 98 09/24/2021   AST 24 09/24/2021   ALT 21 09/24/2021   PROT 7.3 09/24/2021   ALBUMIN 3.7 09/24/2021   CALCIUM 8.7 (L) 09/25/2021   ANIONGAP 7 09/25/2021   GFR 69.89 09/14/2021   Lab Results  Component Value Date   CHOL 141 09/23/2021   Lab Results  Component Value Date   HDL 54.20 09/23/2021   Lab Results  Component Value Date   LDLCALC 69 09/23/2021   Lab Results  Component Value Date   TRIG 92.0 09/23/2021   Lab Results  Component Value Date   CHOLHDL 3 09/23/2021   Lab Results  Component Value Date   HGBA1C 5.6 06/21/2019      Assessment & Plan:   Problem List Items Addressed This Visit       Cardiovascular and Mediastinum  Essential hypertension - Primary   Other Visit Diagnoses     Other fatigue       Relevant Medications   cyanocobalamin ((VITAMIN B-12)) injection 1,000 mcg (Start on 10/12/2021 10:15 AM)       Meds ordered this encounter  Medications   cyanocobalamin ((VITAMIN B-12)) injection 1,000 mcg    Follow-up: Return in about 2 weeks (around 10/26/2021).  Continue carvedilol and restart HCTZ.  Check and record blood pressures. Patient with chronic anemia normal iron levels and low normal B12.  We will offer cobalamin injection today.  Continue to check and record blood pressures follow-up in 2 weeks. Libby Maw, MD

## 2021-10-25 ENCOUNTER — Other Ambulatory Visit: Payer: Self-pay

## 2021-10-26 ENCOUNTER — Encounter: Payer: Self-pay | Admitting: Family Medicine

## 2021-10-26 ENCOUNTER — Ambulatory Visit (INDEPENDENT_AMBULATORY_CARE_PROVIDER_SITE_OTHER): Payer: Medicare Other | Admitting: Family Medicine

## 2021-10-26 VITALS — BP 140/66 | HR 77 | Temp 97.4°F | Ht 63.0 in | Wt 193.4 lb

## 2021-10-26 DIAGNOSIS — D649 Anemia, unspecified: Secondary | ICD-10-CM | POA: Diagnosis not present

## 2021-10-26 DIAGNOSIS — I10 Essential (primary) hypertension: Secondary | ICD-10-CM | POA: Diagnosis not present

## 2021-10-26 MED ORDER — CYANOCOBALAMIN 500 MCG PO CHEW: CHEWABLE_TABLET | ORAL | 1 refills | Status: AC

## 2021-10-26 MED ORDER — LISINOPRIL 10 MG PO TABS: 10.0000 mg | ORAL_TABLET | Freq: Every day | ORAL | 1 refills | Status: DC

## 2021-10-26 NOTE — Progress Notes (Signed)
Established Patient Office Visit  Subjective:  Patient ID: Kerry [name], female    DOB: 12/10/1940  Age: 81 y.o. MRN: [id number]  CC:  Chief Complaint  Patient presents with   Follow-up    2 week follow up on BP and medications, no concerns.     HPI Kerry [name] presents for follow-up of her blood pressure.  Brings in an extensive list that show her pressures are averaging in the 140s over 60s taking carvedilol and HCTZ without the lisinopril.  Past Medical History:  Diagnosis Date   Allergy    Anemia    in her 20's   Aortic insufficiency    mild by echo 10/2020   Arthritis    not dx'd- just per pt    Asthma    AS A CHILD   Cancer ([place])    endometrial cancer with hysterectomy in 1987   Cancer ([place])    melanoma on face    Cataract    removed both eyes    DCM (dilated cardiomyopathy) ([place])    EF 50-55%   GERD (gastroesophageal reflux disease)    occ has with diet    History of syncope    HTN (hypertension)    Hyperlipidemia    OSA on CPAP    TIA (transient ischemic attack)     Past Surgical History:  Procedure Laterality Date   ABDOMINAL HYSTERECTOMY     ANKLE FRACTURE SURGERY     APPENDECTOMY     CATARACT EXTRACTION, BILATERAL     CHOLECYSTECTOMY     COLONOSCOPY  08/01/2007   Dr Henrene Pastor    DENTAL SURGERY     ovary removal     right leg  fracture surgery       Family History  Problem Relation Age of Onset   Mitral valve prolapse Daughter    Heart disease Mother    Kidney failure Mother    Diabetes Mother    Hyperlipidemia Mother    Hypertension Mother    Uterine cancer Mother    Dementia Father    Diabetes Sister    Multiple sclerosis Sister    Diabetes Paternal Grandmother    Throat cancer Brother        smoker   Colon cancer Neg Hx    Colon polyps Neg Hx    Esophageal cancer Neg Hx    Rectal cancer Neg Hx    Stomach cancer Neg Hx     Social History   Socioeconomic History   Marital status: Widowed    Spouse name: Not on file    Number of children: 2   Years of education: Not on file   Highest education level: Not on file  Occupational History   Not on file  Tobacco Use   Smoking status: Never   Smokeless tobacco: Never  Vaping Use   Vaping Use: Never used  Substance and Sexual Activity   Alcohol use: No   Drug use: No   Sexual activity: Not Currently  Other Topics Concern   Not on file  Social History Narrative   Lives at home alone   1 cup of coffee per day    Works at [employer] Strain: Not on file  Food Insecurity: Not on file  Transportation Needs: Not on file  Physical Activity: Not on file  Stress: Stress Concern Present   Feeling of Stress : To some extent  Social  Connections: Moderately Isolated   Frequency of Communication with Friends and Family: More than three times a week   Frequency of Social Gatherings with Friends and Family: More than three times a week   Attends Religious Services: More than 4 times per year   Active Member of [employer] or Organizations: No   Attends Archivist Meetings: Never   Marital Status: Widowed  Intimate Partner Violence: Not on file    Outpatient Medications Prior to Visit  Medication Sig Dispense Refill   acetaminophen (TYLENOL) 500 MG tablet Take 500 mg by mouth every 6 (six) hours as needed for moderate pain or headache.      Ascorbic Acid (VITAMIN C PO) Take 1 tablet by mouth daily.     aspirin 81 MG chewable tablet Chew 81 mg by mouth daily.     atorvastatin (LIPITOR) 20 MG tablet TAKE 1 TABLET(20 MG) BY MOUTH DAILY 90 tablet 3   carvedilol (COREG) 12.5 MG tablet TAKE 1 TABLET(12.5 MG) BY MOUTH TWICE DAILY (Patient taking differently: Take 12.5 mg by mouth 2 (two) times daily with a meal.) 180 tablet 0   Cholecalciferol (VITAMIN D3) 2000 units capsule Take 2,000 Units by mouth daily.     Coenzyme Q10-Vitamin E (QUNOL ULTRA COQ10 PO) Take 1 capsule by mouth daily.     fexofenadine  (ALLEGRA) 180 MG tablet Take 180 mg by mouth every morning.     hydrochlorothiazide (HYDRODIURIL) 25 MG tablet Take 1 tablet (25 mg total) by mouth daily. Please keep upcoming appt in February 2023 with Dr. Radford Pax before anymore refills. Thank you Final Attempt 90 tablet 0   Misc Natural Products (NEURIVA PO) Take 1 tablet by mouth daily.     Multiple Vitamin (MULTIVITAMIN WITH MINERALS) TABS tablet Take 1 tablet by mouth daily.     Polyethyl Glycol-Propyl Glycol (SYSTANE) 0.4-0.3 % SOLN Place 1 drop into both eyes daily as needed (dry eyes).     triamcinolone cream (KENALOG) 0.1 % Apply 1 application topically 2 (two) times daily as needed (lesions on head).     lisinopril (ZESTRIL) 20 MG tablet Take 1 tablet (20 mg total) by mouth daily. Please keep upcoming appt in February 2023 with Dr. Radford Pax before anymore refills. Thank you Final attempt 90 tablet 3   No facility-administered medications prior to visit.    Allergies  Allergen Reactions   Lovastatin Other (See Comments)    myalgias   Sudafed [Pseudoephedrine Hcl] Other (See Comments)    Hallucinations     ROS Review of Systems  Constitutional:  Negative for diaphoresis, fatigue, fever and unexpected weight change.  HENT: Negative.    Eyes:  Negative for photophobia and visual disturbance.  Respiratory: Negative.    Cardiovascular: Negative.   Gastrointestinal: Negative.  Negative for anal bleeding and blood in stool.  Genitourinary:  Negative for hematuria.  Musculoskeletal:  Positive for arthralgias.  Neurological:  Negative for speech difficulty, weakness and headaches.     Objective:    Physical Exam Vitals and nursing note reviewed.  Constitutional:      Appearance: Normal appearance.  HENT:     Head: Normocephalic and atraumatic.     Right Ear: Tympanic membrane, ear canal and external ear normal.     Left Ear: Tympanic membrane, ear canal and external ear normal.     Mouth/Throat:     Mouth: Mucous membranes  are moist.     Pharynx: Oropharynx is clear. No oropharyngeal exudate or posterior oropharyngeal erythema.  Eyes:  General:        Right eye: No discharge.        Left eye: No discharge.     Extraocular Movements: Extraocular movements intact.     Conjunctiva/sclera: Conjunctivae normal.     Pupils: Pupils are equal, round, and reactive to light.  Cardiovascular:     Rate and Rhythm: Normal rate and regular rhythm.  Pulmonary:     Effort: Pulmonary effort is normal.     Breath sounds: Normal breath sounds.  Abdominal:     General: Bowel sounds are normal.  Musculoskeletal:     Cervical back: No rigidity or tenderness.     Right lower leg: No edema.     Left lower leg: No edema.  Lymphadenopathy:     Cervical: No cervical adenopathy.  Skin:    General: Skin is warm and dry.  Neurological:     Mental Status: She is alert and oriented to person, place, and time.  Psychiatric:        Mood and Affect: Mood normal.        Behavior: Behavior normal.    BP 140/66 (BP Location: Left Arm, Patient Position: Sitting, Cuff Size: Normal)    Pulse 77    Temp (!) 97.4 F (36.3 C) (Temporal)    Ht 5\' 3"  (1.6 m)    Wt 193 lb 6.4 oz (87.7 kg)    SpO2 97%    BMI 34.26 kg/m  Wt Readings from Last 3 Encounters:  10/26/21 193 lb 6.4 oz (87.7 kg)  10/12/21 195 lb 9.6 oz (88.7 kg)  09/28/21 193 lb (87.5 kg)     Health Maintenance Due  Topic Date Due   Hepatitis C Screening  Never done   TETANUS/TDAP  Never done   Zoster Vaccines- Shingrix (1 of 2) Never done    There are no preventive care reminders to display for this patient.  Lab Results  Component Value Date   TSH 2.33 03/12/2021   Lab Results  Component Value Date   WBC 8.9 09/25/2021   HGB 10.1 (L) 09/25/2021   HCT 31.1 (L) 09/25/2021   MCV 95.1 09/25/2021   PLT 185 09/25/2021   Lab Results  Component Value Date   NA 137 09/25/2021   K 3.8 09/25/2021   CO2 24 09/25/2021   GLUCOSE 95 09/25/2021   BUN 23 09/25/2021    CREATININE 0.71 09/25/2021   BILITOT 0.6 09/24/2021   ALKPHOS 98 09/24/2021   AST 24 09/24/2021   ALT 21 09/24/2021   PROT 7.3 09/24/2021   ALBUMIN 3.7 09/24/2021   CALCIUM 8.7 (L) 09/25/2021   ANIONGAP 7 09/25/2021   GFR 69.89 09/14/2021   Lab Results  Component Value Date   CHOL 141 09/23/2021   Lab Results  Component Value Date   HDL 54.20 09/23/2021   Lab Results  Component Value Date   LDLCALC 69 09/23/2021   Lab Results  Component Value Date   TRIG 92.0 09/23/2021   Lab Results  Component Value Date   CHOLHDL 3 09/23/2021   Lab Results  Component Value Date   HGBA1C 5.6 06/21/2019      Assessment & Plan:   Problem List Items Addressed This Visit       Cardiovascular and Mediastinum   Essential hypertension - Primary   Relevant Medications   lisinopril (ZESTRIL) 10 MG tablet     Other   Normocytic anemia   Relevant Medications   Cyanocobalamin 500 MCG CHEW  Meds ordered this encounter  Medications   lisinopril (ZESTRIL) 10 MG tablet    Sig: Take 1 tablet (10 mg total) by mouth daily.    Dispense:  90 tablet    Refill:  1   Cyanocobalamin 500 MCG CHEW    Sig: Take one daily    Dispense:  90 tablet    Refill:  1    Follow-up: Return Has scheduled follow-up with me in July..  Continue carvedilol 12.5 twice daily, HCTZ 25 mg and add lisinopril 10 mg daily.  She will see cardiology for follow-up in a few weeks.  Hopefully her pressures will be averaging in the less than 140 range.  She will continue to check and record them.  Please also take a multivitamin with iron and 500 mcg of B12.  Libby Maw, MD

## 2021-10-31 DIAGNOSIS — G4733 Obstructive sleep apnea (adult) (pediatric): Secondary | ICD-10-CM | POA: Diagnosis not present

## 2021-11-09 ENCOUNTER — Other Ambulatory Visit: Payer: Self-pay

## 2021-11-09 ENCOUNTER — Ambulatory Visit: Payer: Medicare Other | Admitting: Cardiology

## 2021-11-09 ENCOUNTER — Encounter: Payer: Self-pay | Admitting: Cardiology

## 2021-11-09 VITALS — BP 122/58 | HR 54 | Ht 63.0 in | Wt 196.4 lb

## 2021-11-09 DIAGNOSIS — I10 Essential (primary) hypertension: Secondary | ICD-10-CM | POA: Diagnosis not present

## 2021-11-09 DIAGNOSIS — I351 Nonrheumatic aortic (valve) insufficiency: Secondary | ICD-10-CM

## 2021-11-09 DIAGNOSIS — Z87898 Personal history of other specified conditions: Secondary | ICD-10-CM

## 2021-11-09 DIAGNOSIS — E7800 Pure hypercholesterolemia, unspecified: Secondary | ICD-10-CM

## 2021-11-09 DIAGNOSIS — I2510 Atherosclerotic heart disease of native coronary artery without angina pectoris: Secondary | ICD-10-CM

## 2021-11-09 DIAGNOSIS — I420 Dilated cardiomyopathy: Secondary | ICD-10-CM

## 2021-11-09 NOTE — Patient Instructions (Signed)
Medication Instructions:  Your physician recommends that you continue on your current medications as directed. Please refer to the Current Medication list given to you today.  *If you need a refill on your cardiac medications before your next appointment, please call your pharmacy*   Lab Work: None If you have labs (blood work) drawn today and your tests are completely normal, you will receive your results only by: [facility] (if you have MyChart) OR A paper copy in the mail If you have any lab test that is abnormal or we need to change your treatment, we will call you to review the results.   Testing/Procedures: None   Follow-Up: At [facility], you and your health needs are our priority.  As part of our continuing mission to provide you with exceptional heart care, we have created designated Provider Care Teams.  These Care Teams include your primary Cardiologist (physician) and Advanced Practice Providers (APPs -  Physician Assistants and Nurse Practitioners) who all work together to provide you with the care you need, when you need it.  We recommend signing up for the patient portal called "MyChart".  Sign up information is provided on this After Visit Summary.  MyChart is used to connect with patients for Virtual Visits (Telemedicine).  Patients are able to view lab/test results, encounter notes, upcoming appointments, etc.  Non-urgent messages can be sent to your provider as well.   To learn more about what you can do with MyChart, go to [email or web address].    Your next appointment:   1 year(s)  The format for your next appointment:   In Person  Provider:   Fransico Him, MD    Other Instructions  Your physician recommends that you obtain some knee high compression stockings that do not have the toes in them.  You can take the prescription provided to any local medical supply store and they can assist with getting your size.

## 2021-11-09 NOTE — Progress Notes (Addendum)
Cardiology Office Note:    Date:  11/09/2021   ID:  Kerry [name], Kerry [name] [birth year], MRN [id number]  PCP:  Libby Maw, MD  Cardiologist:  None    Referring MD: Libby Maw,*   Chief Complaint  Patient presents with   Hypertension   Hyperlipidemia   Aortic Insuffiency    History of Present Illness:    Kerry [name] is a 81 y.o. female with a hx of HTN, syncope and hyperlipidemia (statin intolerant). She also has a hx of CVA and loop recorder was recommended but patient did not want to proceed.   2D echo at that time showed mild LV dysfunction with EF 45-50% which was new and mild AS/AI.   She is here today for followup and is doing well.  She has a history of chronic shortness of breath is felt not to be cardiac related and not really changed any over the years.  She denies any chest pain or pressure,  PND, orthopnea, dizziness, palpitations or syncope. She has chronic LE edema that comes and goes and wears compression hose. She is compliant with her meds and is tolerating meds with no SE.      Past Medical History:  Diagnosis Date   Allergy    Anemia    in her 20's   Aortic insufficiency    mild by echo 10/2020   Arthritis    not dx'd- just per pt    Asthma    AS A CHILD   Cancer ([place])    endometrial cancer with hysterectomy in 1987   Cancer ([place])    melanoma on face    Cataract    removed both eyes    DCM (dilated cardiomyopathy) ([place])    EF 50-55%   GERD (gastroesophageal reflux disease)    occ has with diet    History of syncope    HTN (hypertension)    Hyperlipidemia    OSA on CPAP    TIA (transient ischemic attack)     Past Surgical History:  Procedure Laterality Date   ABDOMINAL HYSTERECTOMY     ANKLE FRACTURE SURGERY     APPENDECTOMY     CATARACT EXTRACTION, BILATERAL     CHOLECYSTECTOMY     COLONOSCOPY  08/01/2007   Dr Henrene Pastor    DENTAL SURGERY     ovary removal     right leg  fracture surgery       Current  Medications: Current Meds  Medication Sig   acetaminophen (TYLENOL) 500 MG tablet Take 500 mg by mouth every 6 (six) hours as needed for moderate pain or headache.    Ascorbic Acid (VITAMIN C PO) Take 1 tablet by mouth daily.   aspirin 81 MG chewable tablet Chew 81 mg by mouth every other day.   atorvastatin (LIPITOR) 20 MG tablet TAKE 1 TABLET(20 MG) BY MOUTH DAILY   carvedilol (COREG) 12.5 MG tablet TAKE 1 TABLET(12.5 MG) BY MOUTH TWICE DAILY   Cholecalciferol (VITAMIN D3) 2000 units capsule Take 2,000 Units by mouth daily.   Coenzyme Q10-Vitamin E (QUNOL ULTRA COQ10 PO) Take 1 capsule by mouth daily.   Cyanocobalamin 500 MCG CHEW Take one daily   fexofenadine (ALLEGRA) 180 MG tablet Take 180 mg by mouth every morning.   hydrochlorothiazide (HYDRODIURIL) 25 MG tablet Take 1 tablet (25 mg total) by mouth daily. Please keep upcoming appt in February 2023 with Dr. Radford Pax before anymore refills. Thank you Final Attempt   lisinopril (ZESTRIL) 10 MG  tablet Take 1 tablet (10 mg total) by mouth daily.   Misc Natural Products (NEURIVA PO) Take 1 tablet by mouth daily.   Multiple Vitamin (MULTIVITAMIN WITH MINERALS) TABS tablet Take 1 tablet by mouth daily.   Polyethyl Glycol-Propyl Glycol (SYSTANE) 0.4-0.3 % SOLN Place 1 drop into both eyes daily as needed (dry eyes).   triamcinolone cream (KENALOG) 0.1 % Apply 1 application topically 2 (two) times daily as needed (lesions on head).     Allergies:   Lovastatin and Sudafed [pseudoephedrine hcl]   Social History   Socioeconomic History   Marital status: Widowed    Spouse name: Not on file   Number of children: 2   Years of education: Not on file   Highest education level: Not on file  Occupational History   Not on file  Tobacco Use   Smoking status: Never   Smokeless tobacco: Never  Vaping Use   Vaping Use: Never used  Substance and Sexual Activity   Alcohol use: No   Drug use: No   Sexual activity: Not Currently  Other Topics Concern    Not on file  Social History Narrative   Lives at home alone   1 cup of coffee per day    Works at [employer] Strain: Not on file  Food Insecurity: Not on file  Transportation Needs: Not on file  Physical Activity: Not on file  Stress: Stress Concern Present   Feeling of Stress : To some extent  Social Connections: Moderately Isolated   Frequency of Communication with Friends and Family: More than three times a week   Frequency of Social Gatherings with Friends and Family: More than three times a week   Attends Religious Services: More than 4 times per year   Active Member of [employer] or Organizations: No   Attends Archivist Meetings: Never   Marital Status: Widowed     Family History: The patient's family history includes Dementia in her father; Diabetes in her mother, paternal grandmother, and sister; Heart disease in her mother; Hyperlipidemia in her mother; Hypertension in her mother; Kidney failure in her mother; Mitral valve prolapse in her daughter; Multiple sclerosis in her sister; Throat cancer in her brother; Uterine cancer in her mother. There is no history of Colon cancer, Colon polyps, Esophageal cancer, Rectal cancer, or Stomach cancer.  ROS:   Please see the history of present illness.    ROS  All other systems reviewed and negative.   EKGs/Labs/Other Studies Reviewed:    The following studies were reviewed today:   EKG:  EKG is not ordered today   Recent Labs: 03/12/2021: TSH 2.33 09/24/2021: ALT 21 09/25/2021: BUN 23; Creatinine, Ser 0.71; Hemoglobin 10.1; Platelets 185; Potassium 3.8; Sodium 137   Recent Lipid Panel    Component Value Date/Time   CHOL 141 09/23/2021 0757   CHOL 143 07/10/2020 1209   TRIG 92.0 09/23/2021 0757   HDL 54.20 09/23/2021 0757   HDL 65 07/10/2020 1209   CHOLHDL 3 09/23/2021 0757   VLDL 18.4 09/23/2021 0757   LDLCALC 69 09/23/2021 0757   LDLCALC 65  07/10/2020 1209   LDLDIRECT 64.0 09/23/2021 0757    Physical Exam:    VS:  BP (!) 122/58    Pulse (!) 54    Ht 5\' 3"  (1.6 m)    Wt 196 lb 6.4 oz (89.1 kg)    SpO2 97%    BMI  34.79 kg/m     Wt Readings from Last 3 Encounters:  11/09/21 196 lb 6.4 oz (89.1 kg)  10/26/21 193 lb 6.4 oz (87.7 kg)  10/12/21 195 lb 9.6 oz (88.7 kg)    GEN: Well nourished, well developed in no acute distress HEENT: Normal NECK: No JVD; No carotid bruits LYMPHATICS: No lymphadenopathy CARDIAC:RRR, no  rubs, gallops. 2/6 SM at RUSB to LUSB RESPIRATORY:  Clear to auscultation without rales, wheezing or rhonchi  ABDOMEN: Soft, non-tender, non-distended MUSCULOSKELETAL:  trace LE edema; No deformity  SKIN: Warm and dry NEUROLOGIC:  Alert and oriented x 3 PSYCHIATRIC:  Normal affect   ASSESSMENT:    1. Essential hypertension   2. History of syncope   3. Pure hypercholesterolemia   4. Nonrheumatic aortic valve insufficiency   5. DCM (dilated cardiomyopathy) ([place])   6. Coronary artery disease involving native coronary artery of native heart without angina pectoris    PLAN:    In order of problems listed above:  1.  HTN  -Her BP is adequately controlled on exam today. -Continue prescription drug management with carvedilol 12.5 mg twice daily, HCTZ 25 mg daily, lisinopril 10 mg daily with as needed refills -I have personally reviewed and interpreted outside labs performed by patient's PCP which showed serum creatinine 0.71 and potassium 3.8 on 09/25/2021  2.  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 -Ziopatch showed PVCs and PACs with a 5 beat run of nonsustained atrial tach  -she was not interested in loop recorder -She denies any significant palpitations  3.  Hyperlipidemia -Continue prescription drug management  with Zetia 10 mg daily and atorvastatin 20 mg daily with as needed refills -I have personally reviewed and interpreted outside labs performed by patient's PCP which showed LDL 69, HDL 54, triglycerides 92 and ALT 21  4.  Aortic insufficiency - 2D echo 08/2019 showed moderate AI and mild AS -Repeat 2D echo 10/20/2020 showed mild AI with no aortic stenosis  5.  Mild LV dysfunction -EF noted to be mildly decreased at 45-50% in October at time of her CVA and by repet echo 08/2019 -Repeat echo 10/20/2020 showed low normal LV function with EF 50 to 55%  6.  ASCAD -she has chronic DOE that is not felt to be related to CAD -Coronary CTA 10/2019 showed a coronary Ca score of 37 which was 40th % for age and sex matched controls and mild non obstructive plaque in the proximal LAD and RCA -She denies any chest pain since I saw her last -Can prescription drug management aspirin 81 mg daily, atorvastatin 20 mg daily, Carvedilol 12.5 mg twice daily with as needed refills  7.  Nonsustained atrial tachycardia -She has not had any further palpitations since I saw her last -She will continue carvedilol 12.5 mg twice daily  Medication Adjustments/Labs and Tests Ordered: Current medicines are reviewed at length with the patient today.  Concerns regarding medicines are outlined above.  No orders of the defined types were placed in this encounter.  No orders of the defined types were placed in this encounter.   Signed, Fransico Him, MD  11/09/2021 2:36 PM    [place]

## 2021-11-27 ENCOUNTER — Other Ambulatory Visit: Payer: Self-pay | Admitting: Cardiology

## 2021-12-03 ENCOUNTER — Other Ambulatory Visit: Payer: Self-pay | Admitting: Cardiology

## 2022-03-14 ENCOUNTER — Ambulatory Visit (INDEPENDENT_AMBULATORY_CARE_PROVIDER_SITE_OTHER): Payer: Medicare Other | Admitting: Family Medicine

## 2022-03-14 ENCOUNTER — Encounter: Payer: Self-pay | Admitting: Family Medicine

## 2022-03-14 VITALS — BP 120/74 | HR 72 | Temp 97.0°F | Ht 63.0 in | Wt 195.8 lb

## 2022-03-14 DIAGNOSIS — E538 Deficiency of other specified B group vitamins: Secondary | ICD-10-CM | POA: Insufficient documentation

## 2022-03-14 DIAGNOSIS — E559 Vitamin D deficiency, unspecified: Secondary | ICD-10-CM

## 2022-03-14 DIAGNOSIS — D649 Anemia, unspecified: Secondary | ICD-10-CM

## 2022-03-14 DIAGNOSIS — E7800 Pure hypercholesterolemia, unspecified: Secondary | ICD-10-CM

## 2022-03-14 DIAGNOSIS — I10 Essential (primary) hypertension: Secondary | ICD-10-CM

## 2022-03-14 LAB — URINALYSIS, ROUTINE W REFLEX MICROSCOPIC
Bilirubin Urine: NEGATIVE
Hgb urine dipstick: NEGATIVE
Ketones, ur: NEGATIVE
Leukocytes,Ua: NEGATIVE
Nitrite: NEGATIVE
RBC / HPF: NONE SEEN (ref 0–?)
Specific Gravity, Urine: 1.01 (ref 1.000–1.030)
Total Protein, Urine: NEGATIVE
Urine Glucose: NEGATIVE
Urobilinogen, UA: 0.2 (ref 0.0–1.0)
pH: 7.5 (ref 5.0–8.0)

## 2022-03-14 LAB — LIPID PANEL
Cholesterol: 134 mg/dL (ref 0–200)
HDL: 55.2 mg/dL (ref >39.00)
LDL Cholesterol: 58 mg/dL (ref 0–99)
NonHDL: 79.17
Total CHOL/HDL Ratio: 2
Triglycerides: 105 mg/dL (ref 0.0–149.0)
VLDL: 21 mg/dL (ref 0.0–40.0)

## 2022-03-14 LAB — BASIC METABOLIC PANEL
BUN: 15 mg/dL (ref 6–23)
CO2: 26 mEq/L (ref 19–32)
Calcium: 9.7 mg/dL (ref 8.4–10.5)
Chloride: 102 mEq/L (ref 96–112)
Creatinine, Ser: 0.82 mg/dL (ref 0.40–1.20)
GFR: 67.61 mL/min (ref >60.00)
Glucose, Bld: 102 mg/dL — ABNORMAL HIGH (ref 70–99)
Potassium: 5 mEq/L (ref 3.5–5.1)
Sodium: 137 mEq/L (ref 135–145)

## 2022-03-14 LAB — CBC
HCT: 33.7 % — ABNORMAL LOW (ref 36.0–46.0)
Hemoglobin: 11.5 g/dL — ABNORMAL LOW (ref 12.0–15.0)
MCHC: 34.1 g/dL (ref 30.0–36.0)
MCV: 91.3 fl (ref 78.0–100.0)
Platelets: 214 10*3/uL (ref 150.0–400.0)
RBC: 3.69 Mil/uL — ABNORMAL LOW (ref 3.87–5.11)
RDW: 13.6 % (ref 11.5–15.5)
WBC: 7.8 10*3/uL (ref 4.0–10.5)

## 2022-03-14 LAB — VITAMIN D 25 HYDROXY (VIT D DEFICIENCY, FRACTURES): VITD: 46.82 ng/mL (ref 30.00–100.00)

## 2022-03-14 LAB — VITAMIN B12: Vitamin B-12: 837 pg/mL (ref 211–911)

## 2022-03-14 NOTE — Progress Notes (Signed)
Established Patient Office Visit  Subjective   Patient ID: Kerry [name], female    DOB: 20-Jul-1941  Age: 81 y.o. MRN: [id number]  Chief Complaint  Patient presents with   Follow-up    F/u meds.  C/o having pain in both legs (compression socks not helping)    HPI follow-up of hypertension, elevated cholesterol, normocytic anemia and vitamin D deficiency.  Having occasional pain in her right buttock and right calf.  Has been resolving when treated with a heating pad.  Consistent with her usual intermittent sciatica.  Difficult to exercise with the current heat.  Has been trying to go in the evening with a friend.  Blood pressure well controlled on current regimen with carvedilol, HCTZ and lisinopril.  Continue supplementation with vitamin D and B12.  She is also taking a multivitamin with iron.    Review of Systems  Constitutional:  Negative for chills, diaphoresis, malaise/fatigue and weight loss.  HENT: Negative.    Eyes: Negative.  Negative for blurred vision and double vision.  Cardiovascular:  Negative for chest pain.  Gastrointestinal:  Negative for abdominal pain.  Genitourinary: Negative.   Musculoskeletal:  Negative for back pain, falls and myalgias.  Neurological:  Negative for dizziness, speech change, loss of consciousness, weakness and headaches.  Psychiatric/Behavioral: Negative.        Objective:     BP 120/74   Pulse 72   Temp (!) 97 F (36.1 C) (Temporal)   Ht '5\' 3"'$  (1.6 m)   Wt 195 lb 12.8 oz (88.8 kg)   SpO2 97%   BMI 34.68 kg/m    Physical Exam Constitutional:      General: She is not in acute distress.    Appearance: Normal appearance. She is not ill-appearing, toxic-appearing or diaphoretic.  HENT:     Head: Normocephalic and atraumatic.     Right Ear: External ear normal.     Left Ear: External ear normal.  Eyes:     General: No scleral icterus.       Right eye: No discharge.        Left eye: No discharge.     Extraocular Movements:  Extraocular movements intact.     Conjunctiva/sclera: Conjunctivae normal.  Cardiovascular:     Rate and Rhythm: Normal rate and regular rhythm.     Pulses:          Dorsalis pedis pulses are 2+ on the right side.       Posterior tibial pulses are 1+ on the right side.  Pulmonary:     Effort: Pulmonary effort is normal. No respiratory distress.     Breath sounds: Normal breath sounds.  Musculoskeletal:     Cervical back: No rigidity or tenderness.     Lumbar back: No tenderness or bony tenderness. Negative right straight leg raise test and negative left straight leg raise test.     Right lower leg: No edema.     Left lower leg: No edema.  Skin:    General: Skin is warm and dry.  Neurological:     Mental Status: She is alert and oriented to person, place, and time.  Psychiatric:        Mood and Affect: Mood normal.        Behavior: Behavior normal.      No results found for any visits on 03/14/22.    The ASCVD Risk score (Arnett DK, et al., 2019) failed to calculate for the following reasons:   The 2019  ASCVD risk score is only valid for ages 48 to 41    Assessment & Plan:   Problem List Items Addressed This Visit       Cardiovascular and Mediastinum   Essential hypertension - Primary   Relevant Orders   Urinalysis, Routine w reflex microscopic   Basic metabolic panel     Other   Hypercholesterolemia   Relevant Orders   Lipid panel   Vitamin D deficiency   Relevant Orders   VITAMIN D 25 Hydroxy (Vit-D Deficiency, Fractures)   Normocytic anemia   Relevant Orders   CBC   Iron, TIBC and Ferritin Panel   B12 deficiency   Relevant Orders   Vitamin B12    Return in about 6 months (around 09/14/2022).  Continue exercising by walking, early morning.  Be sure to stay well-hydrated.  Continue current medications and will adjust levels pending  Libby Maw, MD

## 2022-03-15 LAB — IRON,TIBC AND FERRITIN PANEL
%SAT: 25 % (calc) (ref 16–45)
Ferritin: 72 ng/mL (ref 16–288)
Iron: 81 ug/dL (ref 45–160)
TIBC: 329 mcg/dL (calc) (ref 250–450)

## 2022-03-16 ENCOUNTER — Telehealth: Payer: Self-pay

## 2022-03-16 NOTE — Telephone Encounter (Signed)
-----   Message from Libby Maw, MD sent at 03/16/2022  7:39 AM EDT ----- Labs look good and/or are stable.  Hemoglobin has increased.  Continue current medications and supplements.

## 2022-04-18 ENCOUNTER — Other Ambulatory Visit: Payer: Self-pay | Admitting: Family Medicine

## 2022-04-18 DIAGNOSIS — I10 Essential (primary) hypertension: Secondary | ICD-10-CM

## 2022-06-14 ENCOUNTER — Ambulatory Visit (INDEPENDENT_AMBULATORY_CARE_PROVIDER_SITE_OTHER): Payer: Medicare Other

## 2022-06-14 VITALS — Ht 63.0 in | Wt 194.0 lb

## 2022-06-14 DIAGNOSIS — Z0000 Encounter for general adult medical examination without abnormal findings: Secondary | ICD-10-CM

## 2022-06-14 NOTE — Patient Instructions (Signed)
Kerry [name] , Thank you for taking time to come for your Medicare Wellness Visit. I appreciate your ongoing commitment to your health goals. Please review the following plan we discussed and let me know if I can assist you in the future.   These are the goals we discussed:  Goals      Patient Stated     Wants to lose a little weight to have more energy.        This is a list of the screening recommended for you and due dates:  Health Maintenance  Topic Date Due   Tetanus Vaccine  Never done   Zoster (Shingles) Vaccine (1 of 2) Never done   Pneumonia Vaccine  Completed   Flu Shot  Completed   DEXA scan (bone density measurement)  Completed   HPV Vaccine  Aged Out   COVID-19 Vaccine  Discontinued    Advanced directives: Advance directive discussed with you today. I have provided a copy for you to complete at home and have notarized. Once this is complete please bring a copy in to our office so we can scan it into your chart.   Conditions/risks identified: Aim for 30 minutes of exercise or brisk walking, 6-8 glasses of water, and 5 servings of fruits and vegetables each day.   Next appointment: Follow up in one year for your annual wellness visit.   Preventive Care 41 Years and Older, Female  Preventive care refers to lifestyle choices and visits with your health care provider that can promote health and wellness. What does preventive care include? A yearly physical exam. This is also called an annual well check. Dental exams once or twice a year. Routine eye exams. Ask your health care provider how often you should have your eyes checked. Personal lifestyle choices, including: Daily care of your teeth and gums. Regular physical activity. Eating a healthy diet. Avoiding tobacco and drug use. Limiting alcohol use. Practicing safe sex. Taking low doses of aspirin every day. Taking vitamin and mineral supplements as recommended by your health care provider. What happens during an  annual well check? The services and screenings done by your health care provider during your annual well check will depend on your age, overall health, lifestyle risk factors, and family history of disease. Counseling  Your health care provider may ask you questions about your: Alcohol use. Tobacco use. Drug use. Emotional well-being. Home and relationship well-being. Sexual activity. Eating habits. History of falls. Memory and ability to understand (cognition). Work and work Statistician. Screening  You may have the following tests or measurements: Height, weight, and BMI. Blood pressure. Lipid and cholesterol levels. These may be checked every 5 years, or more frequently if you are over 73 years old. Skin check. Lung cancer screening. You may have this screening every year starting at age 80 if you have a 30-pack-year history of smoking and currently smoke or have quit within the past 15 years. Fecal occult blood test (FOBT) of the stool. You may have this test every year starting at age 80. Flexible sigmoidoscopy or colonoscopy. You may have a sigmoidoscopy every 5 years or a colonoscopy every 10 years starting at age 80. Prostate cancer screening. Recommendations will vary depending on your family history and other risks. Hepatitis C blood test. Hepatitis B blood test. Sexually transmitted disease (STD) testing. Diabetes screening. This is done by checking your blood sugar (glucose) after you have not eaten for a while (fasting). You may have this done every 1-3  years. Abdominal aortic aneurysm (AAA) screening. You may need this if you are a current or former smoker. Osteoporosis. You may be screened starting at age 80 if you are at high risk. Talk with your health care provider about your test results, treatment options, and if necessary, the need for more tests. Vaccines  Your health care provider may recommend certain vaccines, such as: Influenza vaccine. This is recommended  every year. Tetanus, diphtheria, and acellular pertussis (Tdap, Td) vaccine. You may need a Td booster every 10 years. Zoster vaccine. You may need this after age 1. Pneumococcal 13-valent conjugate (PCV13) vaccine. One dose is recommended after age 80. Pneumococcal polysaccharide (PPSV23) vaccine. One dose is recommended after age 80. Talk to your health care provider about which screenings and vaccines you need and how often you need them. This information is not intended to replace advice given to you by your health care provider. Make sure you discuss any questions you have with your health care provider. Document Released: 09/25/2015 Document Revised: 05/18/2016 Document Reviewed: 06/30/2015 Elsevier Interactive Patient Education  2017 [employer] Prevention in the Home Falls can cause injuries. They can happen to people of all ages. There are many things you can do to make your home safe and to help prevent falls. What can I do on the outside of my home? Regularly fix the edges of walkways and driveways and fix any cracks. Remove anything that might make you trip as you walk through a door, such as a raised step or threshold. Trim any bushes or trees on the path to your home. Use bright outdoor lighting. Clear any walking paths of anything that might make someone trip, such as rocks or tools. Regularly check to see if handrails are loose or broken. Make sure that both sides of any steps have handrails. Any raised decks and porches should have guardrails on the edges. Have any leaves, snow, or ice cleared regularly. Use sand or salt on walking paths during winter. Clean up any spills in your garage right away. This includes oil or grease spills. What can I do in the bathroom? Use night lights. Install grab bars by the toilet and in the tub and shower. Do not use towel bars as grab bars. Use non-skid mats or decals in the tub or shower. If you need to sit down in the shower,  use a plastic, non-slip stool. Keep the floor dry. Clean up any water that spills on the floor as soon as it happens. Remove soap buildup in the tub or shower regularly. Attach bath mats securely with double-sided non-slip rug tape. Do not have throw rugs and other things on the floor that can make you trip. What can I do in the bedroom? Use night lights. Make sure that you have a light by your bed that is easy to reach. Do not use any sheets or blankets that are too big for your bed. They should not hang down onto the floor. Have a firm chair that has side arms. You can use this for support while you get dressed. Do not have throw rugs and other things on the floor that can make you trip. What can I do in the kitchen? Clean up any spills right away. Avoid walking on wet floors. Keep items that you use a lot in easy-to-reach places. If you need to reach something above you, use a strong step stool that has a grab bar. Keep electrical cords out of the way. Do  not use floor polish or wax that makes floors slippery. If you must use wax, use non-skid floor wax. Do not have throw rugs and other things on the floor that can make you trip. What can I do with my stairs? Do not leave any items on the stairs. Make sure that there are handrails on both sides of the stairs and use them. Fix handrails that are broken or loose. Make sure that handrails are as long as the stairways. Check any carpeting to make sure that it is firmly attached to the stairs. Fix any carpet that is loose or worn. Avoid having throw rugs at the top or bottom of the stairs. If you do have throw rugs, attach them to the floor with carpet tape. Make sure that you have a light switch at the top of the stairs and the bottom of the stairs. If you do not have them, ask someone to add them for you. What else can I do to help prevent falls? Wear shoes that: Do not have high heels. Have rubber bottoms. Are comfortable and fit you  well. Are closed at the toe. Do not wear sandals. If you use a stepladder: Make sure that it is fully opened. Do not climb a closed stepladder. Make sure that both sides of the stepladder are locked into place. Ask someone to hold it for you, if possible. Clearly mark and make sure that you can see: Any grab bars or handrails. First and last steps. Where the edge of each step is. Use tools that help you move around (mobility aids) if they are needed. These include: Canes. Walkers. Scooters. Crutches. Turn on the lights when you go into a dark area. Replace any light bulbs as soon as they burn out. Set up your furniture so you have a clear path. Avoid moving your furniture around. If any of your floors are uneven, fix them. If there are any pets around you, be aware of where they are. Review your medicines with your doctor. Some medicines can make you feel dizzy. This can increase your chance of falling. Ask your doctor what other things that you can do to help prevent falls. This information is not intended to replace advice given to you by your health care provider. Make sure you discuss any questions you have with your health care provider. Document Released: 06/25/2009 Document Revised: 02/04/2016 Document Reviewed: 10/03/2014 Elsevier Interactive Patient Education  2017 [employer].

## 2022-06-14 NOTE — Progress Notes (Signed)
Subjective:   Kerry [name] is a 81 y.o. female who presents for Medicare Annual (Subsequent) preventive examination.   Virtual Visit via Telephone Note  I connected with  Kerry [name] on 06/14/22 at  8:45 AM EDT by telephone and verified that I am speaking with the correct person using two identifiers.  Location: Patient: home  Provider: [place]  Persons participating in the virtual visit: patient/Nurse Health Advisor   I discussed the limitations, risks, security and privacy concerns of performing an evaluation and management service by telephone and the availability of in person appointments. The patient expressed understanding and agreed to proceed.  Interactive audio and video telecommunications were attempted between this nurse and patient, however failed, due to patient having technical difficulties OR patient did not have access to video capability.  We continued and completed visit with audio only.  Some vital signs may be absent or patient reported.   Daphane Shepherd, LPN  Review of Systems     Cardiac Risk Factors include: advanced age (>43mn, >>85women);hypertension     Objective:    Today's Vitals   06/14/22 0853  Weight: 194 lb (88 kg)  Height: '5\' 3"'$  (1.6 m)   Body mass index is 34.37 kg/m.     06/14/2022    8:57 AM 09/24/2021    6:52 PM 06/05/2021   10:04 AM 08/10/2020    8:41 AM 06/20/2019    6:56 PM 02/22/2018    4:04 PM 04/14/2017    9:52 AM  Advanced Directives  Does Patient Have a Medical Advance Directive? No No No No No No No  Would patient like information on creating a medical advance directive? No - Patient declined No - Patient declined No - Patient declined No - Patient declined  No - Patient declined Yes (MAU/Ambulatory/Procedural Areas - Information given)    Current Medications (verified) Outpatient Encounter Medications as of 06/14/2022  Medication Sig   acetaminophen (TYLENOL) 500 MG tablet Take 500 mg by mouth every 6 (six) hours  as needed for moderate pain or headache.    Ascorbic Acid (VITAMIN C PO) Take 1 tablet by mouth daily.   aspirin 81 MG chewable tablet Chew 81 mg by mouth every other day.   atorvastatin (LIPITOR) 20 MG tablet TAKE 1 TABLET(20 MG) BY MOUTH DAILY   carvedilol (COREG) 12.5 MG tablet TAKE 1 TABLET(12.5 MG) BY MOUTH TWICE DAILY   Cholecalciferol (VITAMIN D3) 2000 units capsule Take 2,000 Units by mouth daily.   Coenzyme Q10-Vitamin E (QUNOL ULTRA COQ10 PO) Take 1 capsule by mouth daily.   Cyanocobalamin 500 MCG CHEW Take one daily   fexofenadine (ALLEGRA) 180 MG tablet Take 180 mg by mouth every morning.   hydrochlorothiazide (HYDRODIURIL) 25 MG tablet Take 1 tablet (25 mg total) by mouth daily.   lisinopril (ZESTRIL) 10 MG tablet TAKE 1 TABLET(10 MG) BY MOUTH DAILY   Misc Natural Products (NEURIVA PO) Take 1 tablet by mouth daily.   Multiple Vitamin (MULTIVITAMIN WITH MINERALS) TABS tablet Take 1 tablet by mouth daily.   Polyethyl Glycol-Propyl Glycol (SYSTANE) 0.4-0.3 % SOLN Place 1 drop into both eyes daily as needed (dry eyes).   triamcinolone cream (KENALOG) 0.1 % Apply 1 application topically 2 (two) times daily as needed (lesions on head).   No facility-administered encounter medications on file as of 06/14/2022.    Allergies (verified) Lovastatin and Sudafed [pseudoephedrine hcl]   History: Past Medical History:  Diagnosis Date   Allergy    Anemia  in her 20's   Aortic insufficiency    mild by echo 10/2020   Arthritis    not dx'd- just per pt    Asthma    AS A CHILD   Cancer ([place])    endometrial cancer with hysterectomy in 1987   Cancer ([place])    melanoma on face    Cataract    removed both eyes    DCM (dilated cardiomyopathy) ([place])    EF 50-55%   GERD (gastroesophageal reflux disease)    occ has with diet    History of syncope    HTN (hypertension)    Hyperlipidemia    OSA on CPAP    TIA (transient ischemic attack)    Past Surgical History:  Procedure Laterality  Date   ABDOMINAL HYSTERECTOMY     ANKLE FRACTURE SURGERY     APPENDECTOMY     CATARACT EXTRACTION, BILATERAL     CHOLECYSTECTOMY     COLONOSCOPY  08/01/2007   Dr Henrene Pastor    DENTAL SURGERY     ovary removal     right leg  fracture surgery      Family History  Problem Relation Age of Onset   Mitral valve prolapse Daughter    Heart disease Mother    Kidney failure Mother    Diabetes Mother    Hyperlipidemia Mother    Hypertension Mother    Uterine cancer Mother    Dementia Father    Diabetes Sister    Multiple sclerosis Sister    Diabetes Paternal Grandmother    Throat cancer Brother        smoker   Colon cancer Neg Hx    Colon polyps Neg Hx    Esophageal cancer Neg Hx    Rectal cancer Neg Hx    Stomach cancer Neg Hx    Social History   Socioeconomic History   Marital status: Widowed    Spouse name: Not on file   Number of children: 2   Years of education: Not on file   Highest education level: Not on file  Occupational History   Not on file  Tobacco Use   Smoking status: Never   Smokeless tobacco: Never  Vaping Use   Vaping Use: Never used  Substance and Sexual Activity   Alcohol use: No   Drug use: No   Sexual activity: Not Currently  Other Topics Concern   Not on file  Social History Narrative   Lives at home alone   1 cup of coffee per day    Works at [employer]rain: [employer] [place]  (06/14/2022)   Overall Financial Resource Strain (CARDIA)    Difficulty of Paying Living Expenses: Not hard at all  Food Insecurity: No Food Insecurity (06/14/2022)   Hunger Vital Sign    Worried About Running Out of Food in the Last Year: Never true    [place] in the Last Year: Never true  Transportation Needs: No Transportation Needs (06/14/2022)   PRAPARE - Hydrologist (Medical): No    Lack of Transportation (Non-Medical): No  Physical Activity: Sufficiently Active (06/14/2022)    Exercise Vital Sign    Days of Exercise per Week: 5 days    Minutes of Exercise per Session: 30 min  Stress: No Stress Concern Present (06/14/2022)   [place]    Feeling of  Stress : Not at all  Social Connections: Moderately Isolated (06/14/2022)   Social Connection and Isolation Panel [NHANES]    Frequency of Communication with Friends and Family: More than three times a week    Frequency of Social Gatherings with Friends and Family: More than three times a week    Attends Religious Services: More than 4 times per year    Active Member of [employer] or Organizations: No    Attends Archivist Meetings: Never    Marital Status: Widowed    Tobacco Counseling Counseling given: Not Answered   Clinical Intake:  Pre-visit preparation completed: Yes  Pain : No/denies pain     Nutritional Risks: None Diabetes: No  How often do you need to have someone help you when you read instructions, pamphlets, or other written materials from your doctor or pharmacy?: 1 - Never  Diabetic?no   Interpreter Needed?: No  Information entered by :: Jadene Pierini, LPN   Activities of Daily Living    06/14/2022    8:57 AM 09/25/2021   12:30 PM  In your present state of health, do you have any difficulty performing the following activities:  Hearing? 0 0  Vision? 0 0  Difficulty concentrating or making decisions? 0 1  Walking or climbing stairs? 0 1  Dressing or bathing? 0 0  Doing errands, shopping? 0 0  Preparing Food and eating ? N   Using the Toilet? N   In the past six months, have you accidently leaked urine? N   Do you have problems with loss of bowel control? N   Managing your Medications? N   Managing your Finances? N   Housekeeping or managing your Housekeeping? N     Patient Care Team: Libby Maw, MD as PCP - General (Family Medicine) Arvella Nigh, MD as Consulting Physician (Obstetrics and  Gynecology) Sueanne Margarita, MD as Consulting Physician (Cardiology)  Indicate any recent Medical Services you may have received from other than [facility] providers in the past year (date may be approximate).     Assessment:   This is a routine wellness examination for [place].  Hearing/Vision screen Vision Screening - Comments:: Annual eye exams wear glasses   Dietary issues and exercise activities discussed: Current Exercise Habits: Home exercise routine, Type of exercise: walking, Time (Minutes): 30, Frequency (Times/Week): 5, Weekly Exercise (Minutes/Week): 150, Intensity: Mild, Exercise limited by: None identified   Goals Addressed             This Visit's Progress    Patient Stated   On track    Wants to lose a little weight to have more energy.       Depression Screen    06/14/2022    8:56 AM 09/28/2021    2:32 PM 06/05/2021   10:02 AM 07/10/2020   10:05 AM 07/30/2019    8:41 AM 06/26/2019   11:09 AM 01/21/2019   12:48 PM  PHQ 2/9 Scores  PHQ - 2 Score 0 0 2 0 0 0 0  PHQ- 9 Score   4        Fall Risk    06/14/2022    8:54 AM 09/28/2021    2:33 PM 06/05/2021   10:06 AM 03/11/2021    1:06 PM 07/10/2020   10:05 AM  Fall Risk   Falls in the past year? 0 1 1 0 0  Number falls in past yr: 0 1 0 0 0  Injury with Fall? 0 1 0 0  0  Risk for fall due to : No Fall Risks  History of fall(s)    Follow up Falls prevention discussed  Falls evaluation completed  Falls evaluation completed    [place]:  Any stairs in or around the home? No  If so, are there any without handrails? No  Home free of loose throw rugs in walkways, pet beds, electrical cords, etc? Yes  Adequate lighting in your home to reduce risk of falls? Yes   ASSISTIVE DEVICES UTILIZED TO PREVENT FALLS:  Life alert? No  Use of a cane, walker or w/c? No  Grab bars in the bathroom? Yes  Shower chair or bench in shower? No  Elevated toilet seat or a handicapped toilet? No           06/14/2022    8:58 AM 06/05/2021   10:07 AM  6CIT Screen  What Year? 0 points 0 points  What month? 0 points 0 points  What time? 0 points 0 points  Count back from 20 0 points 0 points  Months in reverse 0 points 0 points  Repeat phrase 0 points 0 points  Total Score 0 points 0 points    Immunizations Immunization History  Administered Date(s) Administered   Influenza Split 06/19/2014   Influenza, High Dose Seasonal PF 07/16/2018   Influenza-Unspecified 05/31/2015, 06/13/2016, 06/23/2020, 07/04/2021   Moderna Sars-Covid-2 Vaccination 08/01/2020   PFIZER(Purple Top)SARS-COV-2 Vaccination 11/06/2019, 11/27/2019   Pneumococcal Conjugate-13 04/14/2017   Pneumococcal Polysaccharide-23 06/28/2009    TDAP status: Due, Education has been provided regarding the importance of this vaccine. Advised may receive this vaccine at local pharmacy or Health Dept. Aware to provide a copy of the vaccination record if obtained from local pharmacy or Health Dept. Verbalized acceptance and understanding.  Flu Vaccine status: Up to date  Pneumococcal vaccine status: Up to date  Covid-19 vaccine status: Completed vaccines  Qualifies for Shingles Vaccine? Yes   Zostavax completed Yes   Shingrix Completed?: Yes  Screening Tests Health Maintenance  Topic Date Due   TETANUS/TDAP  Never done   Zoster Vaccines- Shingrix (1 of 2) Never done   Pneumonia Vaccine 65+ Years old  Completed   INFLUENZA VACCINE  Completed   DEXA SCAN  Completed   HPV VACCINES  Aged Out   COVID-19 Vaccine  Discontinued    Health Maintenance  Health Maintenance Due  Topic Date Due   TETANUS/TDAP  Never done   Zoster Vaccines- Shingrix (1 of 2) Never done    Colorectal cancer screening: No longer required.   Mammogram status: No longer required due to age.  Bone Density status: Completed 04/14/2014. Results reflect: Bone density results: OSTEOPENIA. Repeat every 5 years.  Lung Cancer Screening: (Low  Dose CT Chest recommended if Age 58-80 years, 30 pack-year currently smoking OR have quit w/in 15years.) does not qualify.   Lung Cancer Screening Referral: n/a  Additional Screening:  Hepatitis C Screening: does not qualify;   Vision Screening: Recommended annual ophthalmology exams for early detection of glaucoma and other disorders of the eye. Is the patient up to date with their annual eye exam?  Yes  Who is the provider or what is the name of the office in which the patient attends annual eye exams? Dr.Groat  If pt is not established with a provider, would they like to be referred to a provider to establish care? No .   Dental Screening: Recommended annual dental exams for proper oral hygiene  Community Resource Referral / Chronic Care Management: CRR required this visit?  No   CCM required this visit?  No      Plan:     I have personally reviewed and noted the following in the patient's chart:   Medical and social history Use of alcohol, tobacco or illicit drugs  Current medications and supplements including opioid prescriptions. Patient is not currently taking opioid prescriptions. Functional ability and status Nutritional status Physical activity Advanced directives List of other physicians Hospitalizations, surgeries, and ER visits in previous 12 months Vitals Screenings to include cognitive, depression, and falls Referrals and appointments  In addition, I have reviewed and discussed with patient certain preventive protocols, quality metrics, and best practice recommendations. A written personalized care plan for preventive services as well as general preventive health recommendations were provided to patient.     Daphane Shepherd, LPN   [id number]/[id number]   Nurse Notes: Due TDAP vaccine

## 2022-08-12 ENCOUNTER — Encounter: Payer: Self-pay | Admitting: Family Medicine

## 2022-08-12 ENCOUNTER — Ambulatory Visit (INDEPENDENT_AMBULATORY_CARE_PROVIDER_SITE_OTHER): Payer: Medicare Other | Admitting: Family Medicine

## 2022-08-12 VITALS — BP 146/72 | HR 69 | Temp 97.4°F | Ht 63.0 in | Wt 194.0 lb

## 2022-08-12 DIAGNOSIS — M5431 Sciatica, right side: Secondary | ICD-10-CM

## 2022-08-12 DIAGNOSIS — I10 Essential (primary) hypertension: Secondary | ICD-10-CM | POA: Diagnosis not present

## 2022-08-12 DIAGNOSIS — R202 Paresthesia of skin: Secondary | ICD-10-CM

## 2022-08-12 LAB — VITAMIN B12: Vitamin B-12: 619 pg/mL (ref 211–911)

## 2022-08-12 LAB — TSH: TSH: 1.76 u[IU]/mL (ref 0.35–5.50)

## 2022-08-12 LAB — HEMOGLOBIN A1C: Hgb A1c MFr Bld: 6 % (ref 4.6–6.5)

## 2022-08-12 MED ORDER — LISINOPRIL 20 MG PO TABS: 20.0000 mg | ORAL_TABLET | Freq: Every day | ORAL | 3 refills | Status: DC

## 2022-08-12 NOTE — Progress Notes (Signed)
Established Patient Office Visit   Subjective:  Patient ID: Kerry [name], female    DOB: [birth year]  Age: 81 y.o. MRN: [id number]  Chief Complaint  Patient presents with   Extremity Weakness    Right foot and leg little tingling x 1 month starting on left side.     Extremity Weakness  Associated symptoms include tingling.   Encounter Diagnoses  Name Primary?   Essential hypertension Yes   Paresthesias    Sciatica of right side    For paresthesias in her feet over the last 3 to 4 weeks.  She continues to feel pain in her buttock that radiates down the back of her right leg.  Blood pressure has been running in the upper 140s over 80s on her current regimen.   Review of Systems  Constitutional: Negative.   HENT: Negative.    Eyes:  Negative for blurred vision, discharge and redness.  Respiratory: Negative.    Cardiovascular: Negative.   Gastrointestinal:  Negative for abdominal pain.  Genitourinary: Negative.   Musculoskeletal:  Positive for extremity weakness. Negative for myalgias.  Skin:  Negative for rash.  Neurological:  Positive for tingling. Negative for loss of consciousness and weakness.  Endo/Heme/Allergies:  Negative for polydipsia.     Current Outpatient Medications:    acetaminophen (TYLENOL) 500 MG tablet, Take 500 mg by mouth every 6 (six) hours as needed for moderate pain or headache. , Disp: , Rfl:    Ascorbic Acid (VITAMIN C PO), Take 1 tablet by mouth daily., Disp: , Rfl:    aspirin 81 MG chewable tablet, Chew 81 mg by mouth every other day., Disp: , Rfl:    atorvastatin (LIPITOR) 20 MG tablet, TAKE 1 TABLET(20 MG) BY MOUTH DAILY, Disp: 90 tablet, Rfl: 3   carvedilol (COREG) 12.5 MG tablet, TAKE 1 TABLET(12.5 MG) BY MOUTH TWICE DAILY, Disp: 180 tablet, Rfl: 3   Cholecalciferol (VITAMIN D3) 2000 units capsule, Take 2,000 Units by mouth daily., Disp: , Rfl:    Coenzyme Q10-Vitamin E (QUNOL ULTRA COQ10 PO), Take 1 capsule by mouth daily., Disp: , Rfl:     Cyanocobalamin 500 MCG CHEW, Take one daily, Disp: 90 tablet, Rfl: 1   fexofenadine (ALLEGRA) 180 MG tablet, Take 180 mg by mouth every morning., Disp: , Rfl:    hydrochlorothiazide (HYDRODIURIL) 25 MG tablet, Take 1 tablet (25 mg total) by mouth daily., Disp: 90 tablet, Rfl: 3   lisinopril (ZESTRIL) 20 MG tablet, Take 1 tablet (20 mg total) by mouth daily., Disp: 90 tablet, Rfl: 3   Misc Natural Products (NEURIVA PO), Take 1 tablet by mouth daily., Disp: , Rfl:    Multiple Vitamin (MULTIVITAMIN WITH MINERALS) TABS tablet, Take 1 tablet by mouth daily., Disp: , Rfl:    Polyethyl Glycol-Propyl Glycol (SYSTANE) 0.4-0.3 % SOLN, Place 1 drop into both eyes daily as needed (dry eyes)., Disp: , Rfl:    triamcinolone cream (KENALOG) 0.1 %, Apply 1 application topically 2 (two) times daily as needed (lesions on head)., Disp: , Rfl:    Objective:     BP (!) 146/72 (BP Location: Right Arm, Patient Position: Sitting, Cuff Size: Normal)   Pulse 69   Temp (!) 97.4 F (36.3 C) (Temporal)   Ht '5\' 3"'$  (1.6 m)   Wt 194 lb (88 kg)   SpO2 94%   BMI 34.37 kg/m    Physical Exam Constitutional:      General: She is not in acute distress.    Appearance: Normal  appearance. She is not ill-appearing, toxic-appearing or diaphoretic.  HENT:     Head: Normocephalic and atraumatic.     Right Ear: External ear normal.     Left Ear: External ear normal.  Eyes:     General: No scleral icterus.       Right eye: No discharge.        Left eye: No discharge.     Extraocular Movements: Extraocular movements intact.     Conjunctiva/sclera: Conjunctivae normal.  Pulmonary:     Effort: Pulmonary effort is normal. No respiratory distress.  Musculoskeletal:     Cervical back: No rigidity or tenderness.     Right foot: Normal capillary refill. No tenderness or bony tenderness. Normal pulse.     Comments: Mild generalized swelling of the right foot and ankle.  Skin:    General: Skin is warm and dry.  Neurological:      Mental Status: She is alert and oriented to person, place, and time.  Psychiatric:        Mood and Affect: Mood normal.        Behavior: Behavior normal.      No results found for any visits on 08/12/22.    The ASCVD Risk score (Arnett DK, et al., 2019) failed to calculate for the following reasons:   The 2019 ASCVD risk score is only valid for ages 20 to 49    Assessment & Plan:   Essential hypertension -     Lisinopril; Take 1 tablet (20 mg total) by mouth daily.  Dispense: 90 tablet; Refill: 3  Paresthesias -     Vitamin B12 -     TSH -     Hemoglobin A1c -     Iron, TIBC and Ferritin Panel  Sciatica of right side -     Ambulatory referral to Sports Medicine    Return Follow-up as already scheduled on January 3..   And increased lisinopril to 20 mg daily.  Blood work for paresthesias.  Sports medicine referral ongoing sciatica. Libby Maw, MD

## 2022-08-13 LAB — IRON,TIBC AND FERRITIN PANEL
%SAT: 32 % (calc) (ref 16–45)
Ferritin: 68 ng/mL (ref 16–288)
Iron: 106 ug/dL (ref 45–160)
TIBC: 332 mcg/dL (calc) (ref 250–450)

## 2022-08-18 ENCOUNTER — Telehealth: Payer: Self-pay | Admitting: Family Medicine

## 2022-08-18 NOTE — Telephone Encounter (Signed)
Pt would like a cb concerning her most recent lab results. Please advise pt @ [phone]

## 2022-08-19 ENCOUNTER — Encounter: Payer: Self-pay | Admitting: Family Medicine

## 2022-08-19 ENCOUNTER — Ambulatory Visit
Admission: RE | Admit: 2022-08-19 | Discharge: 2022-08-19 | Disposition: A | Discharge status: Home / Self Care | Payer: Medicare Other | Source: Ambulatory Visit | Attending: Family Medicine | Admitting: Family Medicine

## 2022-08-19 ENCOUNTER — Ambulatory Visit: Payer: Medicare Other | Admitting: Family Medicine

## 2022-08-19 VITALS — BP 120/50 | Ht 63.0 in | Wt 194.0 lb

## 2022-08-19 DIAGNOSIS — M5440 Lumbago with sciatica, unspecified side: Secondary | ICD-10-CM | POA: Diagnosis not present

## 2022-08-19 DIAGNOSIS — R2681 Unsteadiness on feet: Secondary | ICD-10-CM

## 2022-08-19 DIAGNOSIS — M545 Low back pain, unspecified: Secondary | ICD-10-CM | POA: Diagnosis not present

## 2022-08-19 DIAGNOSIS — R202 Paresthesia of skin: Secondary | ICD-10-CM | POA: Diagnosis not present

## 2022-08-19 NOTE — Telephone Encounter (Signed)
Patient aware of labs.  

## 2022-08-19 NOTE — Progress Notes (Signed)
  Kerry [name] - 81 y.o. female MRN [id number]  Date of birth: May 12, 1941    SUBJECTIVE:      Chief Complaint:/ HPI:  Several months of right-sided back and lower extremity pain.  Sometimes starts in the calf, sometimes starts in the low back but usually extends to both sites.  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  During that time she will have some discomfort but when she gets home afterward is when she has the most discomfort.  Does not keep her from sleeping however.  Pain in the calf is in the lateral portion of the calf radiating somewhat to the back.  Back pain is right lumbar radiating to the right lateral thigh.  She also feels fairly unbalanced with her gait.  This has been going on for a long time.  She has history of leg fracture on the right and ankle dislocation fracture on the left many many years ago.  She also has noted some tingling in both feet.  This started when she changed her compression hose from 20 cm to the higher compression.  She wore them for 1 day and after that changed back.  Despite that she is continue to have intermittent tingling in her bilateral feet at times.  OBJECTIVE: BP (!) 120/50   Ht '5\' 3"'$  (1.6 m)   Wt 194 lb (88 kg)   BMI 34.37 kg/m   Physical Exam:  Vital signs are reviewed. GENERAL: Well-developed female no acute distress BACK: No defect.  Area of tenderness is in the musculature right above the PSIS and radiating into the right buttock.  Negative straight leg raise bilaterally in seated position. EXTREMITY: Area of the calf where she points to for pain is nontender to palpation.  There is no mass there.  She has normal strength hip flexors, knee hip flexor and extensor, dorsiflexion plantarflexion bilaterally symmetrical. GAIT: Wide-based, mildly antalgic.  Short stride length.  Slight out-toeing right greater than left. VASCULAR: Dorsalis pedis pulses 2+ bilaterally are symmetrical. ASSESSMENT &  PLAN:  See problem based charting & AVS for pt instructions. Paresthesias: Not sure that these are related to her back issues.  Temporally they seem to be related to when she increase her compression stocking but then she reverted back to the old ones and this did not resolve.  For now it seems to be a mild problem so we will follow. Gait instability Multifactorial.  I do think she would benefit from gait analysis and have referred her to physical therapy for that.  Low back pain with sciatica Definitely has low back pain and some right lower extremity pain.  Not convinced this is classic sciatic pain.  Will get some x-rays of her low back and refer her to if physical therapy for back strengthening program.  Follow-up after she has been in PT for couple of weeks.

## 2022-08-19 NOTE — Assessment & Plan Note (Signed)
Definitely has low back pain and some right lower extremity pain.  Not convinced this is classic sciatic pain.  Will get some x-rays of her low back and refer her to if physical therapy for back strengthening program.  Follow-up after she has been in PT for couple of weeks.

## 2022-08-19 NOTE — Patient Instructions (Signed)
I have placed an order for physical therapy.  They should call you within the next week.  After he has been in PT for couple of weeks, I would like to see you back in my office.  On the way home today or early next week, please go by [facility] and have the back x-rays done.  As I mentioned, I will send you a note about those.  I will not call you unless there is something unexpected on them.  If you have any worsening symptoms, please call the office, otherwise I will see you back after you have been in PT for couple weeks.  You may have to call and get an appointment with [place] after you know when your PT sessions will start.  It was great to meet you!

## 2022-08-19 NOTE — Assessment & Plan Note (Signed)
Multifactorial.  I do think she would benefit from gait analysis and have referred her to physical therapy for that.

## 2022-08-23 DIAGNOSIS — N958 Other specified menopausal and perimenopausal disorders: Secondary | ICD-10-CM | POA: Diagnosis not present

## 2022-08-23 DIAGNOSIS — M8588 Other specified disorders of bone density and structure, other site: Secondary | ICD-10-CM | POA: Diagnosis not present

## 2022-08-23 DIAGNOSIS — Z1231 Encounter for screening mammogram for malignant neoplasm of breast: Secondary | ICD-10-CM | POA: Diagnosis not present

## 2022-08-24 DIAGNOSIS — M545 Low back pain, unspecified: Secondary | ICD-10-CM | POA: Diagnosis not present

## 2022-08-25 ENCOUNTER — Encounter: Payer: Self-pay | Admitting: Family Medicine

## 2022-08-26 DIAGNOSIS — M545 Low back pain, unspecified: Secondary | ICD-10-CM | POA: Diagnosis not present

## 2022-08-29 DIAGNOSIS — Z1272 Encounter for screening for malignant neoplasm of vagina: Secondary | ICD-10-CM | POA: Diagnosis not present

## 2022-08-29 DIAGNOSIS — Z6835 Body mass index (BMI) 35.0-35.9, adult: Secondary | ICD-10-CM | POA: Diagnosis not present

## 2022-08-29 DIAGNOSIS — Z01419 Encounter for gynecological examination (general) (routine) without abnormal findings: Secondary | ICD-10-CM | POA: Diagnosis not present

## 2022-08-30 ENCOUNTER — Telehealth: Payer: Self-pay

## 2022-08-30 NOTE — Telephone Encounter (Signed)
Contacted pt, LVM informing her due to not using her machine in the last 90 days we can cancel her appt, advised to call back if she has questions.

## 2022-08-30 NOTE — Progress Notes (Deleted)
[facility] [address] [place]. [place] [address] [phone]       OFFICE FOLLOW UP NOTE  [name] Date of Birth:  [birth year] Medical Record Number:  [id number]    Kerry [name] stroke provider: Dr. Leonie Man [facility] sleep provider: Dr. Rexene Alberts   Reason for visit: TIA and sleep apnea follow-up    CHIEF COMPLAINT:  No chief complaint on file.    HPI:   Update 08/31/2021 JM: Returns for 39-monthTIA and CPAP follow-up unaccompanied  TIA: Overall stable without new or reoccurring stroke/TIA symptoms.  Compliant on aspirin and atorvastatin -denies side effects.  Blood pressure today 125/63.  Lipid panel 03/2021 LDL 68  CPAP:  She was refitted with a different nasal mask with extra foam which she has been tolerating better.  Had [place] November - had difficulty tolerating at that time due to frequent coughing and congestion. She does still have residual increased fatigue and activity intolerance.  She also has year-round allergies with increased mucous production at times which makes it difficult to use CPAP at times. Otherwise she has been tolerating well and re-started using routinely this week. Noted high leak rate but she does not notice this being any issue.  Epworth Sleepiness Scale 7.             History provided for reference purposes only Update 03/04/2021 JM: [name] for 666-monthIA and CPAP follow-up.  TIA: Stable.  Denies new or reoccurring stroke/TIA symptoms.  Compliant on aspirin and atorvastatin without associated side effects.  Blood pressure today 138/75.   CPAP: She reports continued difficulty tolerating nasal nasal mask due to frequent leaks as well as seasonal allergies.  Order placed at prior visit for mask refitting but she reports she was not contacted.  She will use a neck pillow to try to keep her head still as this will reduce mask leak.  She will also sometimes fall asleep in her recliner prior to placing mask on.  She  has no other concerns today.         Update 08/17/2020 JM: [name] for 6-82-monthA and CPAP follow-up.  Stable from stroke/TIA standpoint continuing on aspirin and atorvastatin without side effects.  Blood pressure today 138/68. Monitors at home and has been stable.  Reports running out of carvedilol and does not have cardiology follow-up until next week and has been taking metoprolol which was leftover from a prior prescription.  Compliance report from 07/14/2020 -08/12/2020 shows 22 out of 30 usage days for 73% compliance and 19 days greater than 4 hours for 63% compliance.  Average usage 5 hours and 42 minutes.  Residual AHI 4.8.  Pressure setting min pressure 5 and max pressure 11 with pressure in the 95th percentile 10.8.  Leaks in the 95 percentile 45.7.  She has been experiencing difficulty tolerating due to frequent mask leaks and difficulty getting her nasal mask to correctly fit despite recently getting new supplies.  Also experiencing increase congestion and sinus pressure. ESS 7.  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 no further concerns at this time.  Update 02/26/2020 JM: Kerry [name] being seen for initial CPAP compliance visit with prior history of TIA. Compliance report reviewed from 01/26/2020 -02/24/2020 showing 28 out of 30 usage days with 27 days greater than 4 hours for 90% compliance with average usage 6 hours and 59 minutes.  Residual AHI 2.1 with min pressure 5 cm H2O and max pressure of 11 cm H2O with EPR level 1.  Pressure in the 95th percentile 10.7.  Leaks in the 95th percentile 45.5 L/min. She does report mild improvement of daytime fatigue where she continues to work without difficulty and typically only becomes tired in the evening.  ESS 6 (reading, watching TV and laying down in afternoon).  She does report waking up with dry mouth in the morning as well as difficulty with  allergies and increased congestion.  Reports occasional leaks when laying on her side but will subside after mask adjustment. She continues to follow with [facility] for needed supplies and is currently up to date on supplies. Stable from TIA standpoint without new or reoccurring stroke/TIA symptoms.  Remains on aspirin and atorvastatin for secondary stroke prevention.  Blood pressure today 133/71.  Continues to follow routinely with PCP/cardiology for HTN and HLD management.  No stroke/TIA related concerns at this time.   Update 11/28/2019 JM:: [name] is a 81 year old female who is being seen today, 11/28/2019, for TIA follow-up.  She has been doing well from a stroke standpoint reoccurring stroke/TIA symptoms.  She was referred to [facility] for suspected sleep apnea evaluated by Dr. Rexene Alberts and underwent sleep study on 10/13/2019 which did show mild sleep apnea with AHI 11.9/h and recommended initiation of AutoPap.  Per review of phone conversation on 10/24/2019, patient wanted to further consider use of AutoPap and at this time, is interested in pursuing.  Continues on aspirin and recently initiated atorvastatin by cardiology for secondary stroke prevention without side effects.  Blood pressure today satisfactory at 124/58.  No further concerns at this time.  Initial visit 07/30/2019 JM: Kerry [name] is a 81 year old female who is being seen today for hospital follow-up.  Has been stable since discharge without new or reoccurring stroke/TIA symptoms.  She has returned to work at [employer] without difficulty.  She does experience bilateral lower extremity pain and right shoulder pain which appears to be related to arthritis.  She does endorse daytime fatigue where she can easily lay down to take a nap or will fall asleep watching TV.  She does have to sit upright in a recliner while sleeping.  At times she will feel increased shortness of breath with exertion and is unsure if this is related to her asthma.   She does occasionally experience visual aura migraines 2-3 times per year with longstanding history of migraines.  She also experiences occasional double left frontal pain typically occurring with increased anxiety or stress.  Completed 3 weeks DAPT and continues on aspirin alone without bleeding or bruising.  Continues on Zetia without side effects.  Blood pressure today 145/69.  Follow-up with cardiology scheduled on 08/06/2019 to discuss 30-day cardiac event monitor.  Has appointment with a different cardiologist in December with patient reporting to follow-up regarding CHF.  No further concerns at this time.   Stroke admission 06/20/2019: [name] is a 81 y.o. female with history of HTN, HLD, migraines, endometrial cancer s/p TAH 1987  presented on 06/20/2019 with transient slurred speech with word finding difficulty and right sided numbness.    Stroke work-up completed with symptoms likely related to left brain cortical TIA which appeared embolic in origin.  MRI no acute stroke with evidence of small vessel disease.  MRA negative for LVO with mild arthrosclerosis and bilateral maxillary sinus fluid levels.  Carotid Doppler bilateral ICA 1 to 39% stenosis  in VAs antegrade.  2D echo EF of 45 to 50% with some diastolic function but no evidence of cardiac source of embolus..  Lower extremity venous Dopplers negative for DVT.  Recommended loop recorder placement to assess for atrial fibrillation but patient declined and was agreeable to do outpatient cardiac monitoring and recommend follow-up with cardiology outpatient to initiate.  LDL 91.  A1c 5.6.  Recommended DAPT for 3 weeks and aspirin alone.  HTN stable.  Initiated Zetia 10 mg daily with history of statin intolerance.  Other stroke risk factors include new combined systolic/diastolic CHF, advanced age and migraines but no prior history of stroke.  Discharged home in stable condition without therapy needs.    ROS:   14 system review of systems  performed and negative with exception of those listed in HPI  PMH:  Past Medical History:  Diagnosis Date   Allergy    Anemia    in her 20's   Aortic insufficiency    mild by echo 10/2020   Arthritis    not dx'd- just per pt    Asthma    AS A CHILD   Cancer ([place])    endometrial cancer with hysterectomy in 1987   Cancer ([place])    melanoma on face    Cataract    removed both eyes    DCM (dilated cardiomyopathy) ([place])    EF 50-55%   GERD (gastroesophageal reflux disease)    occ has with diet    History of syncope    HTN (hypertension)    Hyperlipidemia    OSA on CPAP    TIA (transient ischemic attack)     PSH:  Past Surgical History:  Procedure Laterality Date   ABDOMINAL HYSTERECTOMY     ANKLE FRACTURE SURGERY     APPENDECTOMY     CATARACT EXTRACTION, BILATERAL     CHOLECYSTECTOMY     COLONOSCOPY  08/01/2007   Dr Henrene Pastor    DENTAL SURGERY     ovary removal     right leg  fracture surgery       Social History:  Social History   Socioeconomic History   Marital status: Widowed    Spouse name: Not on file   Number of children: 2   Years of education: Not on file   Highest education level: Not on file  Occupational History   Not on file  Tobacco Use   Smoking status: Never   Smokeless tobacco: Never  Vaping Use   Vaping Use: Never used  Substance and Sexual Activity   Alcohol use: No   Drug use: No   Sexual activity: Not Currently  Other Topics Concern   Not on file  Social History Narrative   Lives at home alone   1 cup of coffee per day    Works at [employer] Strain: [place]  (06/14/2022)   Overall Financial Resource Strain (CARDIA)    Difficulty of Paying Living Expenses: Not hard at all  Food Insecurity: No Food Insecurity (06/14/2022)   Hunger Vital Sign    Worried About Running Out of Food in the Last Year: Never true    [place] in the Last Year: Never true  Transportation  Needs: No Transportation Needs (06/14/2022)   PRAPARE - Hydrologist (Medical): No    Lack of Transportation (Non-Medical): No  Physical Activity: Sufficiently Active (06/14/2022)   Exercise  Vital Sign    Days of Exercise per Week: 5 days    Minutes of Exercise per Session: 30 min  Stress: No Stress Concern Present (06/14/2022)   [place]    Feeling of Stress : Not at all  Social Connections: Moderately Isolated (06/14/2022)   Social Connection and Isolation Panel [NHANES]    Frequency of Communication with Friends and Family: More than three times a week    Frequency of Social Gatherings with Friends and Family: More than three times a week    Attends Religious Services: More than 4 times per year    Active Member of [employer] or Organizations: No    Attends Archivist Meetings: Never    Marital Status: Widowed  Intimate Partner Violence: Not At Risk (06/14/2022)   Humiliation, Afraid, Rape, and Kick questionnaire    Fear of Current or Ex-Partner: No    Emotionally Abused: No    Physically Abused: No    Sexually Abused: No    Family History:  Family History  Problem Relation Age of Onset   Mitral valve prolapse Daughter    Heart disease Mother    Kidney failure Mother    Diabetes Mother    Hyperlipidemia Mother    Hypertension Mother    Uterine cancer Mother    Dementia Father    Diabetes Sister    Multiple sclerosis Sister    Diabetes Paternal Grandmother    Throat cancer Brother        smoker   Colon cancer Neg Hx    Colon polyps Neg Hx    Esophageal cancer Neg Hx    Rectal cancer Neg Hx    Stomach cancer Neg Hx     Medications:   Current Outpatient Medications on File Prior to Visit  Medication Sig Dispense Refill   acetaminophen (TYLENOL) 500 MG tablet Take 500 mg by mouth every 6 (six) hours as needed for moderate pain or headache.      Ascorbic Acid (VITAMIN C  PO) Take 1 tablet by mouth daily.     aspirin 81 MG chewable tablet Chew 81 mg by mouth every other day.     atorvastatin (LIPITOR) 20 MG tablet TAKE 1 TABLET(20 MG) BY MOUTH DAILY 90 tablet 3   carvedilol (COREG) 12.5 MG tablet TAKE 1 TABLET(12.5 MG) BY MOUTH TWICE DAILY 180 tablet 3   Cholecalciferol (VITAMIN D3) 2000 units capsule Take 2,000 Units by mouth daily.     Coenzyme Q10-Vitamin E (QUNOL ULTRA COQ10 PO) Take 1 capsule by mouth daily.     Cyanocobalamin 500 MCG CHEW Take one daily 90 tablet 1   fexofenadine (ALLEGRA) 180 MG tablet Take 180 mg by mouth every morning.     hydrochlorothiazide (HYDRODIURIL) 25 MG tablet Take 1 tablet (25 mg total) by mouth daily. 90 tablet 3   lisinopril (ZESTRIL) 20 MG tablet Take 1 tablet (20 mg total) by mouth daily. 90 tablet 3   Misc Natural Products (NEURIVA PO) Take 1 tablet by mouth daily.     Multiple Vitamin (MULTIVITAMIN WITH MINERALS) TABS tablet Take 1 tablet by mouth daily.     Polyethyl Glycol-Propyl Glycol (SYSTANE) 0.4-0.3 % SOLN Place 1 drop into both eyes daily as needed (dry eyes).     triamcinolone cream (KENALOG) 0.1 % Apply 1 application topically 2 (two) times daily as needed (lesions on head).     No current facility-administered medications on file prior to visit.  Allergies:   Allergies  Allergen Reactions   Lovastatin Other (See Comments)    myalgias   Sudafed [Pseudoephedrine Hcl] Other (See Comments)    Hallucinations      Physical Exam  There were no vitals filed for this visit.    There is no height or weight on file to calculate BMI. No results found.  General: well developed, well nourished, very pleasant elderly Caucasian female, seated, in no evident distress Head: head normocephalic and atraumatic.   Neck: supple with no carotid or supraclavicular bruits Cardiovascular: regular rate and rhythm, no murmurs Musculoskeletal: Multiple bilateral hand arthritic nodules Skin:  no  rash/petichiae Vascular:  Normal pulses all extremities   Neurologic Exam Mental Status: Awake and fully alert.   Fluent speech and language. Oriented to place and time. Recent and remote memory intact. Attention span, concentration and fund of knowledge appropriate. Mood and affect appropriate.  Cranial Nerves: Pupils equal, briskly reactive to light. Extraocular movements full without nystagmus. Visual fields full to confrontation. Hearing intact. Facial sensation intact. Face, tongue, palate moves normally and symmetrically.  Motor: Normal bulk and tone. Normal strength in all tested extremity muscles. Sensory.: intact to touch , pinprick , position and vibratory sensation.  Coordination: Rapid alternating movements normal in all extremities. Finger-to-nose and heel-to-shin performed accurately bilaterally. Gait and Station: Arises from chair without difficulty. Stance is normal. Gait demonstrates normal stride length and balance without use of assistive device Reflexes: 1+ and symmetric. Toes downgoing.       ASSESSMENT/PLAN: Kerry [name] is a 81 y.o. year old female with underlying medical history of TIA 06/2019, CHF, HTN, HLD, migraines and OSA on CPAP.     OSA on CPAP: Low compliance noted in setting of COVID with frequent coughing 1 month ago - reports restarted routinely using this week discussed importance of increasing nightly usage greater than 4 hours nightly Continue current settings Order will be placed to [facility] for continued supplies   Hx of TIA:  Stable without new recurrent stroke/TIA symptoms  continue aspirin 81 mg daily  and atorvastatin for secondary stroke prevention.    Discussed secondary stroke prevention measures and importance of close PCP and maintain strict control of hypertension with blood pressure goal below 130/90 and cholesterol with LDL cholesterol (bad cholesterol) goal below 70 mg/dL.     Follow-up in 1 year or call earlier  if needed   CC:  Libby Maw, MD   I spent 26 minutes of face-to-face and non-face-to-face time with patient.  This included previsit chart review, lab review, order entry, electronic health record documentation, and patient education and discussion regarding review of CPAP compliance and importance of increasing compliance, prior history of TIA and managing stroke risk factors and answered all other questions to patient satisfaction  Frann Rider, AGNP-BC  [facility] [address] [place], [place] [address]  Phone [phone] Fax [phone] Note: This document was prepared with digital dictation and possible smart phrase technology. Any transcriptional errors that result from this process are unintentional.  I reviewed the above note and documentation by the Nurse Practitioner and agree with the history, exam, assessment and plan as outlined above. I was available for consultation. Star Age, MD, PhD [facility])

## 2022-08-31 ENCOUNTER — Ambulatory Visit: Payer: Medicare Other | Admitting: Adult Health

## 2022-08-31 DIAGNOSIS — M545 Low back pain, unspecified: Secondary | ICD-10-CM | POA: Diagnosis not present

## 2022-09-07 DIAGNOSIS — M545 Low back pain, unspecified: Secondary | ICD-10-CM | POA: Diagnosis not present

## 2022-09-09 DIAGNOSIS — M545 Low back pain, unspecified: Secondary | ICD-10-CM | POA: Diagnosis not present

## 2022-09-14 ENCOUNTER — Ambulatory Visit (INDEPENDENT_AMBULATORY_CARE_PROVIDER_SITE_OTHER): Payer: Medicare Other | Admitting: Family Medicine

## 2022-09-14 ENCOUNTER — Encounter: Payer: Self-pay | Admitting: Family Medicine

## 2022-09-14 VITALS — BP 118/66 | HR 69 | Temp 97.6°F | Ht 63.0 in | Wt 195.4 lb

## 2022-09-14 DIAGNOSIS — M545 Low back pain, unspecified: Secondary | ICD-10-CM | POA: Diagnosis not present

## 2022-09-14 DIAGNOSIS — I10 Essential (primary) hypertension: Secondary | ICD-10-CM

## 2022-09-14 DIAGNOSIS — M8580 Other specified disorders of bone density and structure, unspecified site: Secondary | ICD-10-CM | POA: Diagnosis not present

## 2022-09-14 DIAGNOSIS — E7800 Pure hypercholesterolemia, unspecified: Secondary | ICD-10-CM

## 2022-09-14 LAB — BASIC METABOLIC PANEL
BUN: 19 mg/dL (ref 6–23)
CO2: 28 mEq/L (ref 19–32)
Calcium: 9.4 mg/dL (ref 8.4–10.5)
Chloride: 104 mEq/L (ref 96–112)
Creatinine, Ser: 0.75 mg/dL (ref 0.40–1.20)
GFR: 74.99 mL/min (ref >60.00)
Glucose, Bld: 98 mg/dL (ref 70–99)
Potassium: 4.7 mEq/L (ref 3.5–5.1)
Sodium: 139 mEq/L (ref 135–145)

## 2022-09-14 LAB — LIPID PANEL
Cholesterol: 139 mg/dL (ref 0–200)
HDL: 52.8 mg/dL (ref >39.00)
LDL Cholesterol: 69 mg/dL (ref 0–99)
NonHDL: 86.03
Total CHOL/HDL Ratio: 3
Triglycerides: 86 mg/dL (ref 0.0–149.0)
VLDL: 17.2 mg/dL (ref 0.0–40.0)

## 2022-09-14 NOTE — Progress Notes (Signed)
Established Patient Office Visit   Subjective:  Patient ID: Kerry [name], female    DOB: 1941-06-09  Age: 82 y.o. MRN: [id number]  Chief Complaint  Patient presents with   Follow-up    6 month follow up, no concerns. Patient fasting    HPI Encounter Diagnoses  Name Primary?   Essential hypertension Yes   Hypercholesterolemia    Osteopenia, unspecified location    Follow-up of hypertension status post increasing lisinopril to 20 mg.  Continues atorvastatin 20 mg for elevated cholesterol.  Recent bone scan reveals osteopenia.  She is supplementing with 2000 IUs of vitamin D3 but not taking any calcium.  She is exercising by walking.  Continues working with sports medicine for sciatica.   Review of Systems  Constitutional: Negative.   HENT: Negative.    Eyes:  Negative for blurred vision, discharge and redness.  Respiratory: Negative.    Cardiovascular: Negative.   Gastrointestinal:  Negative for abdominal pain.  Genitourinary: Negative.   Musculoskeletal: Negative.  Negative for myalgias.  Skin:  Negative for rash.  Neurological:  Negative for tingling, loss of consciousness and weakness.  Endo/Heme/Allergies:  Negative for polydipsia.     Current Outpatient Medications:    acetaminophen (TYLENOL) 500 MG tablet, Take 500 mg by mouth every 6 (six) hours as needed for moderate pain or headache. , Disp: , Rfl:    Ascorbic Acid (VITAMIN C PO), Take 1 tablet by mouth daily., Disp: , Rfl:    aspirin 81 MG chewable tablet, Chew 81 mg by mouth every other day., Disp: , Rfl:    atorvastatin (LIPITOR) 20 MG tablet, TAKE 1 TABLET(20 MG) BY MOUTH DAILY, Disp: 90 tablet, Rfl: 3   carvedilol (COREG) 12.5 MG tablet, TAKE 1 TABLET(12.5 MG) BY MOUTH TWICE DAILY, Disp: 180 tablet, Rfl: 3   Cholecalciferol (VITAMIN D3) 2000 units capsule, Take 2,000 Units by mouth daily., Disp: , Rfl:    Coenzyme Q10-Vitamin E (QUNOL ULTRA COQ10 PO), Take 1 capsule by mouth daily., Disp: , Rfl:     Cyanocobalamin 500 MCG CHEW, Take one daily, Disp: 90 tablet, Rfl: 1   fexofenadine (ALLEGRA) 180 MG tablet, Take 180 mg by mouth every morning., Disp: , Rfl:    hydrochlorothiazide (HYDRODIURIL) 25 MG tablet, Take 1 tablet (25 mg total) by mouth daily., Disp: 90 tablet, Rfl: 3   lisinopril (ZESTRIL) 20 MG tablet, Take 1 tablet (20 mg total) by mouth daily., Disp: 90 tablet, Rfl: 3   Misc Natural Products (NEURIVA PO), Take 1 tablet by mouth daily., Disp: , Rfl:    Multiple Vitamin (MULTIVITAMIN WITH MINERALS) TABS tablet, Take 1 tablet by mouth daily., Disp: , Rfl:    Polyethyl Glycol-Propyl Glycol (SYSTANE) 0.4-0.3 % SOLN, Place 1 drop into both eyes daily as needed (dry eyes)., Disp: , Rfl:    Objective:     BP 118/66 (BP Location: Left Arm, Patient Position: Sitting, Cuff Size: Normal)   Pulse 69   Temp 97.6 F (36.4 C) (Temporal)   Ht '5\' 3"'$  (1.6 m)   Wt 195 lb 6.4 oz (88.6 kg)   SpO2 96%   BMI 34.61 kg/m    Physical Exam Constitutional:      General: She is not in acute distress.    Appearance: Normal appearance. She is not ill-appearing, toxic-appearing or diaphoretic.  HENT:     Head: Normocephalic and atraumatic.     Right Ear: External ear normal.     Left Ear: External ear normal.  Mouth/Throat:     Mouth: Mucous membranes are moist.     Pharynx: Oropharynx is clear. No oropharyngeal exudate or posterior oropharyngeal erythema.  Eyes:     General: No scleral icterus.       Right eye: No discharge.        Left eye: No discharge.     Extraocular Movements: Extraocular movements intact.     Conjunctiva/sclera: Conjunctivae normal.     Pupils: Pupils are equal, round, and reactive to light.  Cardiovascular:     Rate and Rhythm: Normal rate and regular rhythm.  Pulmonary:     Effort: Pulmonary effort is normal. No respiratory distress.     Breath sounds: Normal breath sounds.  Abdominal:     General: Bowel sounds are normal.  Musculoskeletal:     Cervical back:  No rigidity or tenderness.  Skin:    General: Skin is warm and dry.  Neurological:     Mental Status: She is alert and oriented to person, place, and time.  Psychiatric:        Mood and Affect: Mood normal.        Behavior: Behavior normal.      No results found for any visits on 09/14/22.    The ASCVD Risk score (Arnett DK, et al., 2019) failed to calculate for the following reasons:   The 2019 ASCVD risk score is only valid for ages 73 to 17    Assessment & Plan:   Essential hypertension -     Basic metabolic panel  Hypercholesterolemia -     Lipid panel  Osteopenia, unspecified location    Return in about 6 months (around 03/15/2023).  Continue lisinopril 20 and atorvastatin 20 for hypertension and elevated cholesterol.  Blood pressure much improved on the higher dose of lisinopril.  Continue vitamin D supplementation and start 1200 mg of calcium daily for osteopenia.  Libby Maw, MD

## 2022-09-21 DIAGNOSIS — M545 Low back pain, unspecified: Secondary | ICD-10-CM | POA: Diagnosis not present

## 2022-09-28 DIAGNOSIS — M545 Low back pain, unspecified: Secondary | ICD-10-CM | POA: Diagnosis not present

## 2022-09-30 ENCOUNTER — Ambulatory Visit: Payer: Medicare Other | Admitting: Family Medicine

## 2022-09-30 VITALS — BP 122/58 | Ht 63.0 in | Wt 195.0 lb

## 2022-09-30 DIAGNOSIS — M5440 Lumbago with sciatica, unspecified side: Secondary | ICD-10-CM | POA: Diagnosis not present

## 2022-09-30 DIAGNOSIS — M8580 Other specified disorders of bone density and structure, unspecified site: Secondary | ICD-10-CM | POA: Diagnosis not present

## 2022-09-30 NOTE — Assessment & Plan Note (Signed)
Continue with weightbearing exercise and appropriate vitamin D and calcium throughout diet or supplementation.

## 2022-09-30 NOTE — Progress Notes (Signed)
[facility]: Attending Note: I have examined the patient, reviewed the chart, discussed the assessment and plan with the Sports Medicine Fellow. I agree with assessment and treatment plan as detailed in the Fellow's note.  

## 2022-09-30 NOTE — Assessment & Plan Note (Signed)
She reports improvement of her pain and symptoms with physical therapy.  She should continue physical therapy until she is been released and then transition to home program.  I did states she will do very well from this injury.  She does have some degenerative disc disease and listhesis at L4-L5.  I did discuss with her she may need to revisit her exercises if she gets a flare in her back pain.  She verbalized understanding.  She can follow-up with [place] as needed.

## 2022-09-30 NOTE — Progress Notes (Signed)
   Established Patient Office Visit  Subjective   Patient ID: Kerry [name], female    DOB: 06/29/1941  Age: 82 y.o. MRN: [id number]  Follow-up low back pain.   She reports she has been doing much better since we last saw her.  She started physical therapy notes been helping her a lot.  She is now able to bend down and pick things up without sharp pain in her back.  She occasionally has a little bit of pain in her posterior thigh and calf however it does not persisted.  She denies any radiation of pain down into her legs or numbness and tingling.  She also brought with her today her bone density testing which showed she is osteopenic.  No new injury.   ROS as listed above in HPI    Objective:     BP (!) 122/58   Ht '5\' 3"'$  (1.6 m)   Wt 195 lb (88.5 kg)   BMI 34.54 kg/m   Physical Exam Vitals reviewed.  Constitutional:      General: She is not in acute distress.    Appearance: Normal appearance. She is not ill-appearing, toxic-appearing or diaphoretic.  Pulmonary:     Effort: Pulmonary effort is normal.  Neurological:     Mental Status: She is alert.   Low back: No obvious deformity or asymmetry.  No tenderness to palpation midline spine.  Some slight tenderness to the right-sided SI joint.  She has good range of motion with flexion and extension.  Negative seated straight leg raise testing bilaterally.  Normal gait.    Assessment & Plan:   Problem List Items Addressed This Visit       Nervous and Auditory   Low back pain with sciatica - Primary    She reports improvement of her pain and symptoms with physical therapy.  She should continue physical therapy until she is been released and then transition to home program.  I did states she will do very well from this injury.  She does have some degenerative disc disease and listhesis at L4-L5.  I did discuss with her she may need to revisit her exercises if she gets a flare in her back pain.  She verbalized understanding.  She  can follow-up with [place] as needed.        Musculoskeletal and Integument   Osteopenia    Continue with weightbearing exercise and appropriate vitamin D and calcium throughout diet or supplementation.       Return if symptoms worsen or fail to improve.    Elmore Guise, DO

## 2022-10-05 DIAGNOSIS — M545 Low back pain, unspecified: Secondary | ICD-10-CM | POA: Diagnosis not present

## 2022-10-07 DIAGNOSIS — M545 Low back pain, unspecified: Secondary | ICD-10-CM | POA: Diagnosis not present

## 2022-10-17 ENCOUNTER — Other Ambulatory Visit: Payer: Self-pay | Admitting: Family Medicine

## 2022-10-17 DIAGNOSIS — I10 Essential (primary) hypertension: Secondary | ICD-10-CM

## 2022-10-21 DIAGNOSIS — M545 Low back pain, unspecified: Secondary | ICD-10-CM | POA: Diagnosis not present

## 2022-10-28 DIAGNOSIS — M545 Low back pain, unspecified: Secondary | ICD-10-CM | POA: Diagnosis not present

## 2022-11-11 ENCOUNTER — Ambulatory Visit: Payer: Medicare Other | Admitting: Cardiology

## 2022-11-11 DIAGNOSIS — M545 Low back pain, unspecified: Secondary | ICD-10-CM | POA: Diagnosis not present

## 2022-11-12 ENCOUNTER — Other Ambulatory Visit: Payer: Self-pay | Admitting: Cardiology

## 2022-11-23 ENCOUNTER — Other Ambulatory Visit: Payer: Self-pay | Admitting: Cardiology

## 2022-11-24 ENCOUNTER — Other Ambulatory Visit: Payer: Self-pay | Admitting: Cardiology

## 2022-11-25 DIAGNOSIS — M545 Low back pain, unspecified: Secondary | ICD-10-CM | POA: Diagnosis not present

## 2022-12-06 DIAGNOSIS — D0439 Carcinoma in situ of skin of other parts of face: Secondary | ICD-10-CM | POA: Diagnosis not present

## 2022-12-06 DIAGNOSIS — L578 Other skin changes due to chronic exposure to nonionizing radiation: Secondary | ICD-10-CM | POA: Diagnosis not present

## 2022-12-06 DIAGNOSIS — M79671 Pain in right foot: Secondary | ICD-10-CM | POA: Diagnosis not present

## 2022-12-06 DIAGNOSIS — D225 Melanocytic nevi of trunk: Secondary | ICD-10-CM | POA: Diagnosis not present

## 2022-12-06 DIAGNOSIS — L821 Other seborrheic keratosis: Secondary | ICD-10-CM | POA: Diagnosis not present

## 2022-12-06 DIAGNOSIS — D485 Neoplasm of uncertain behavior of skin: Secondary | ICD-10-CM | POA: Diagnosis not present

## 2022-12-06 DIAGNOSIS — L570 Actinic keratosis: Secondary | ICD-10-CM | POA: Diagnosis not present

## 2022-12-09 DIAGNOSIS — M545 Low back pain, unspecified: Secondary | ICD-10-CM | POA: Diagnosis not present

## 2022-12-15 ENCOUNTER — Ambulatory Visit: Payer: Medicare Other | Admitting: Cardiology

## 2022-12-17 ENCOUNTER — Other Ambulatory Visit: Payer: Self-pay | Admitting: Cardiology

## 2022-12-18 IMAGING — US US FNA BIOPSY THYROID 1ST LESION
1 series · 9 of 9 positions shown · non-contrast
Comparison: Thyroid ultrasound performed 04/07/2021

MEDICATIONS:
1% plain lidocaine, 1 mL

COMPLICATIONS:
None immediate.

INDICATION: Indeterminate right mid thyroid nodule

EXAM:
ULTRASOUND GUIDED FINE NEEDLE ASPIRATION OF INDETERMINATE RIGHT MID
THYROID NODULE
TECHNIQUE: Informed written consent was obtained from the patient after a
discussion of the risks, benefits and alternatives to treatment.
Questions regarding the procedure were encouraged and answered. A
timeout was performed prior to the initiation of the procedure.

[Series 1: us fna biopsy thyroid 1st lesion · 0.06mm/px · 9 acquisitions, 9 frames shown]
[im 1/9]
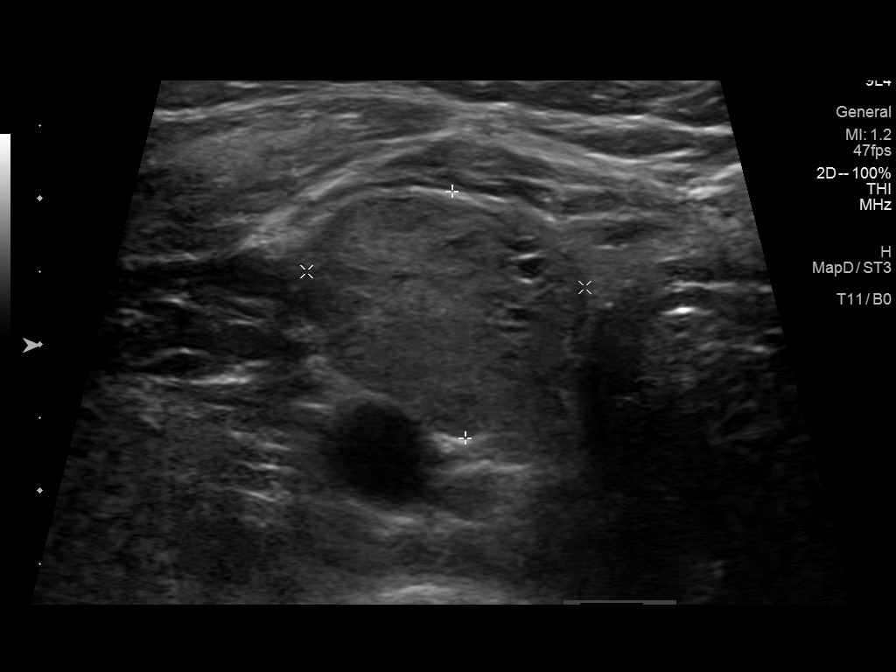
[im 2/9]
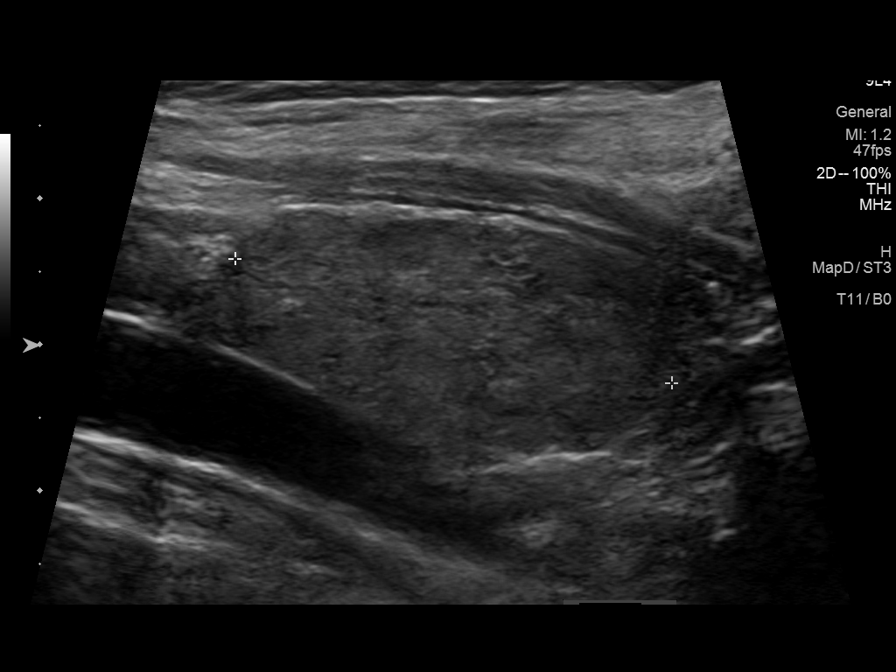
[im 3/9]
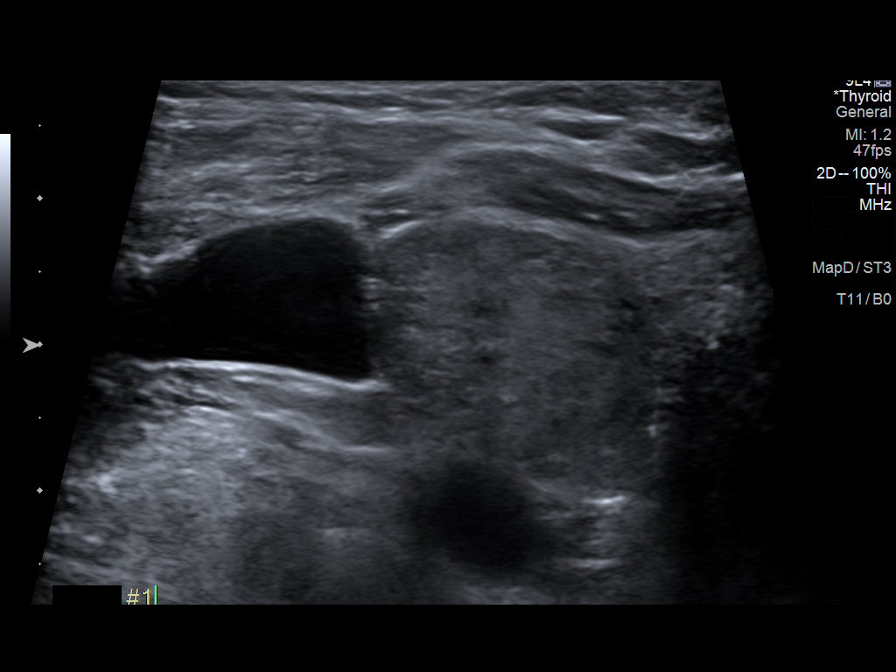
[im 4/9]
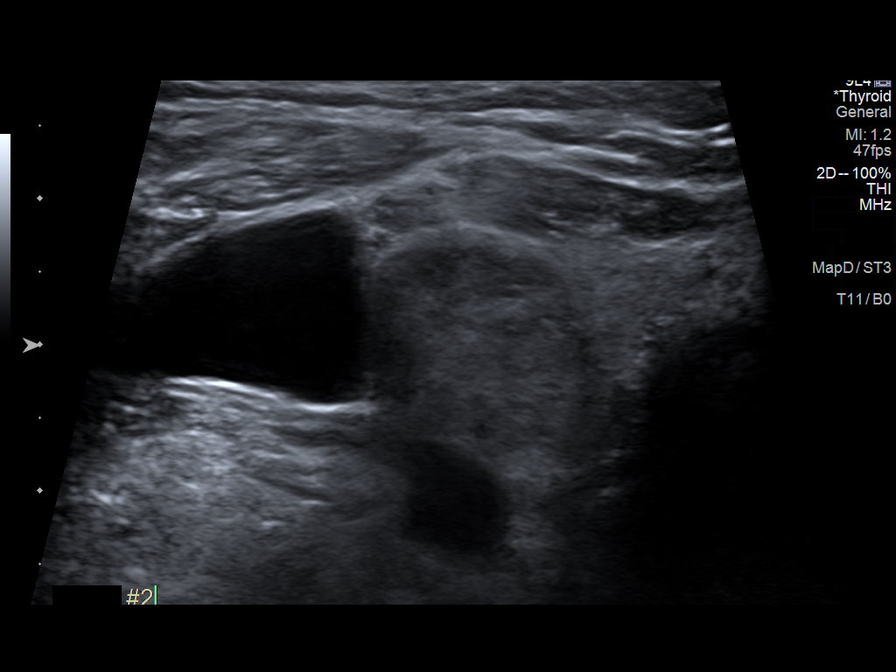
[im 5/9]
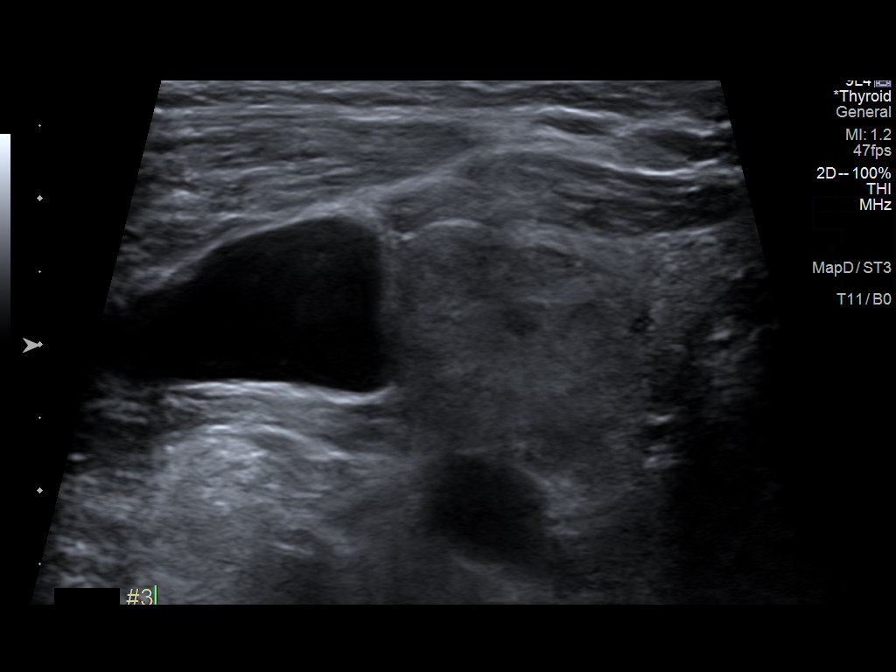
[im 6/9]
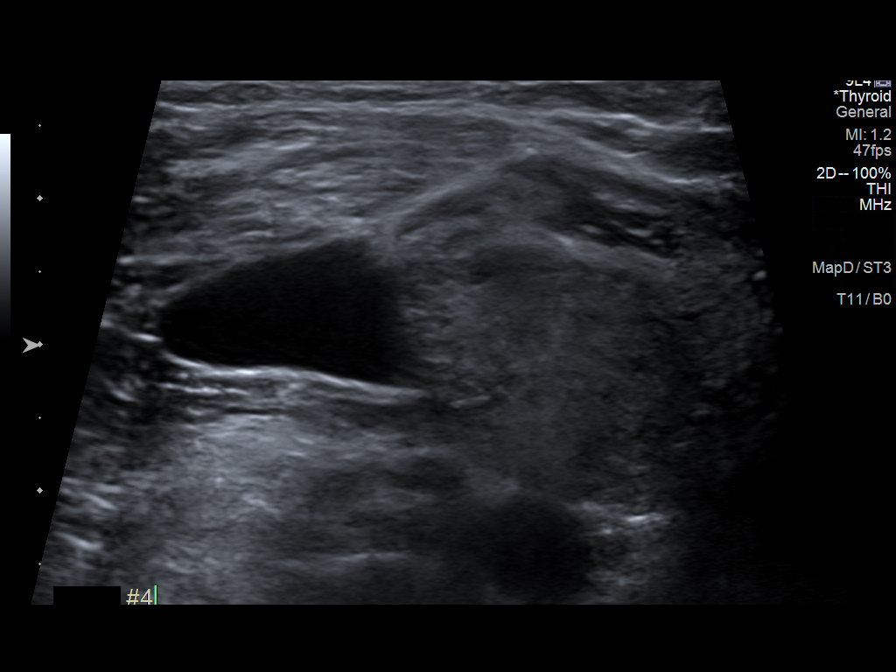
[im 7/9]
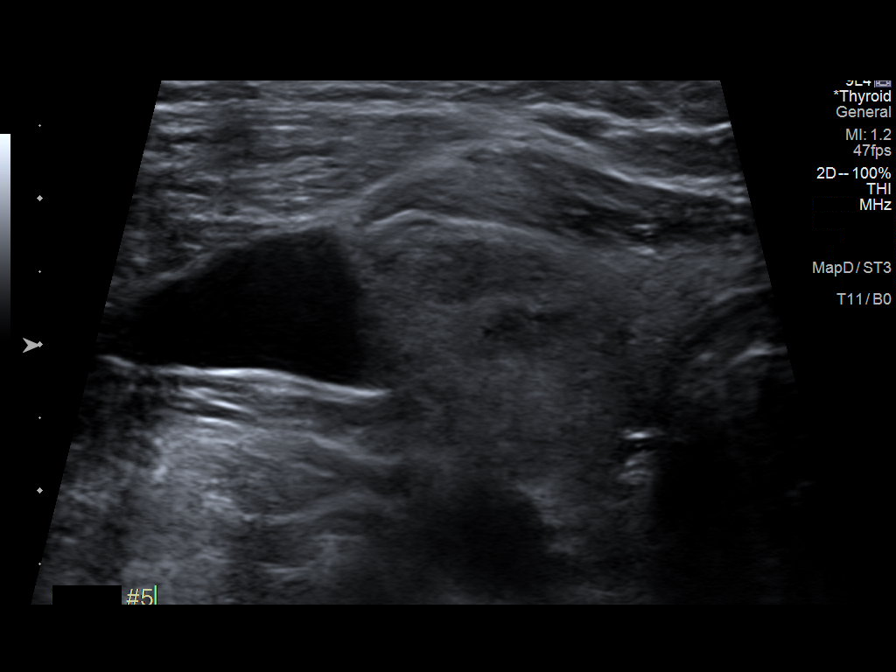
[im 8/9]
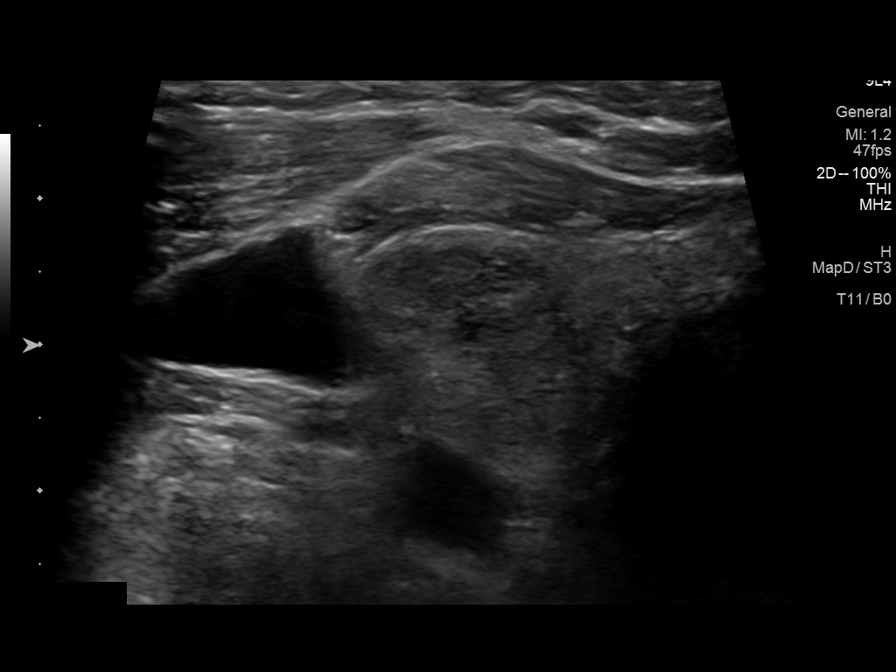
[im 9/9]
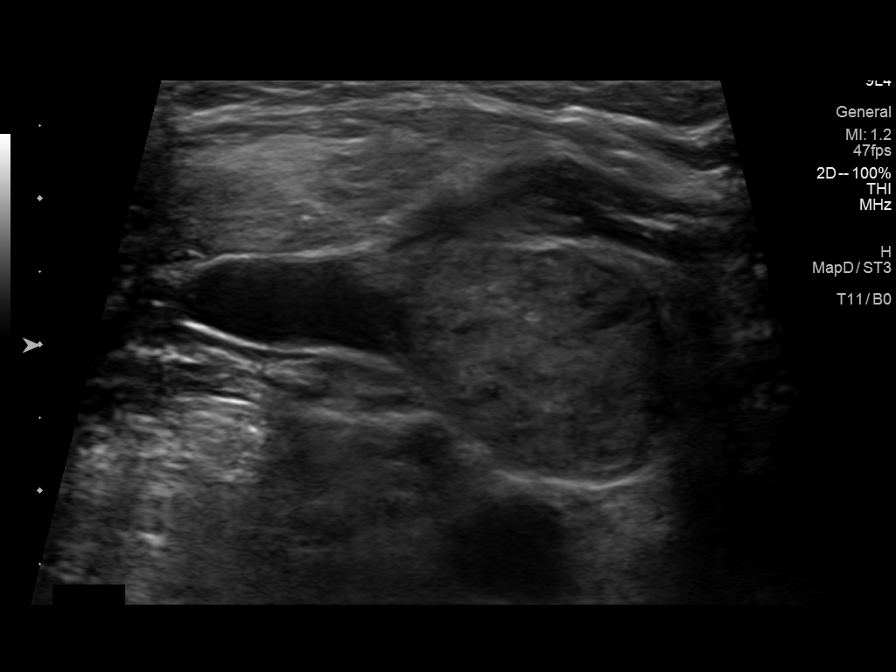

[9 of 9 positions shown; findings below may reference images not displayed]

Pre-procedural ultrasound scanning demonstrated unchanged size and
appearance of the indeterminate nodule within the right thyroid
lobe.

The procedure was planned. The neck was prepped in the usual sterile
fashion, and a sterile drape was applied covering the operative
field. A timeout was performed prior to the initiation of the
procedure. Local anesthesia was provided with 1% lidocaine.

Under direct ultrasound guidance, 5 FNA biopsies were performed of
the right mid thyroid nodule with a 25 gauge needle. Multiple
ultrasound images were saved for procedural documentation purposes.
The samples were prepared and submitted to pathology.

Limited post procedural scanning was negative for hematoma or
additional complication. Dressings were placed. The patient
tolerated the above procedures procedure well without immediate
postprocedural complication.
FINDINGS: FINDINGS
Nodule reference number based on prior diagnostic ultrasound: 1

Maximum size: 3.2 cm

Location: Right  ;  Mid

ACR TI-RADS total points: 4

ACR TI-RADS risk category:  TR4 (4-6 points)

Prior biopsy:  No

Reason for biopsy: meets ACR TI-RADS criteria

Ultrasound imaging confirms appropriate placement of the needles
within the thyroid nodule.
IMPRESSION: Technically successful ultrasound guided fine needle aspiration of
right mid thyroid nodule as described above.

## 2022-12-20 DIAGNOSIS — H43813 Vitreous degeneration, bilateral: Secondary | ICD-10-CM | POA: Diagnosis not present

## 2022-12-20 DIAGNOSIS — H1045 Other chronic allergic conjunctivitis: Secondary | ICD-10-CM | POA: Diagnosis not present

## 2022-12-20 DIAGNOSIS — D3132 Benign neoplasm of left choroid: Secondary | ICD-10-CM | POA: Diagnosis not present

## 2022-12-20 DIAGNOSIS — D3131 Benign neoplasm of right choroid: Secondary | ICD-10-CM | POA: Diagnosis not present

## 2022-12-23 DIAGNOSIS — M545 Low back pain, unspecified: Secondary | ICD-10-CM | POA: Diagnosis not present

## 2023-01-06 ENCOUNTER — Ambulatory Visit
Admission: EM | Admit: 2023-01-06 | Discharge: 2023-01-06 | Disposition: A | Discharge status: Home / Self Care | Payer: Medicare Other | Attending: Urgent Care | Admitting: Urgent Care

## 2023-01-06 DIAGNOSIS — Z23 Encounter for immunization: Secondary | ICD-10-CM | POA: Diagnosis not present

## 2023-01-06 DIAGNOSIS — L089 Local infection of the skin and subcutaneous tissue, unspecified: Secondary | ICD-10-CM

## 2023-01-06 DIAGNOSIS — S51852A Open bite of left forearm, initial encounter: Secondary | ICD-10-CM | POA: Diagnosis not present

## 2023-01-06 DIAGNOSIS — W5501XA Bitten by cat, initial encounter: Secondary | ICD-10-CM

## 2023-01-06 MED ORDER — AMOXICILLIN-POT CLAVULANATE 875-125 MG PO TABS: 1.0000 | ORAL_TABLET | Freq: Two times a day (BID) | ORAL | 0 refills | Status: DC

## 2023-01-06 MED ORDER — TETANUS-DIPHTH-ACELL PERTUSSIS 5-2.5-18.5 LF-MCG/0.5 IM SUSY
0.5000 mL | PREFILLED_SYRINGE | Freq: Once | INTRAMUSCULAR | Status: AC
  Administered 2023-01-06: 0.5 mL via INTRAMUSCULAR

## 2023-01-06 NOTE — ED Provider Notes (Signed)
[facility]  Note:  This document was prepared using Conservation officer, historic buildings and may include unintentional dictation errors.  MRN: [id number] DOB: [birth year]  Subjective:   Kerry [name] is a 82 y.o. female presenting for 1 day history of persistent and worsening left forearm pain with redness.  Patient suffered a cat bite from her own cat last night.  Symptoms have progressed rapidly into today, reports that she was able to express pus from the cat bite wound.  Needs to have her Tdap updated.  No current facility-administered medications for this encounter.  Current Outpatient Medications:    acetaminophen (TYLENOL) 500 MG tablet, Take 500 mg by mouth every 6 (six) hours as needed for moderate pain or headache. , Disp: , Rfl:    Ascorbic Acid (VITAMIN C PO), Take 1 tablet by mouth daily., Disp: , Rfl:    aspirin 81 MG chewable tablet, Chew 81 mg by mouth every other day., Disp: , Rfl:    atorvastatin (LIPITOR) 20 MG tablet, TAKE 1 TABLET(20 MG) BY MOUTH DAILY, Disp: 90 tablet, Rfl: 0   carvedilol (COREG) 12.5 MG tablet, Take 1 tablet (12.5 mg total) by mouth 2 (two) times daily. Please keep scheduled appointment for future refills. Thank you., Disp: 180 tablet, Rfl: 0   Cholecalciferol (VITAMIN D3) 2000 units capsule, Take 2,000 Units by mouth daily., Disp: , Rfl:    Coenzyme Q10-Vitamin E (QUNOL ULTRA COQ10 PO), Take 1 capsule by mouth daily., Disp: , Rfl:    Cyanocobalamin 500 MCG CHEW, Take one daily, Disp: 90 tablet, Rfl: 1   fexofenadine (ALLEGRA) 180 MG tablet, Take 180 mg by mouth every morning., Disp: , Rfl:    hydrochlorothiazide (HYDRODIURIL) 25 MG tablet, TAKE 1 TABLET (25 MG TOTAL) BY MOUTH DAILY. KEEP APPT FOR FUTURE REFILLS OR # 90 DAY SUPPLY., Disp: 90 tablet, Rfl: 0   lisinopril (ZESTRIL) 20 MG tablet, Take 1 tablet (20 mg total) by mouth daily., Disp: 90 tablet, Rfl: 3   Misc Natural Products (NEURIVA PO), Take 1 tablet by mouth daily.,  Disp: , Rfl:    Multiple Vitamin (MULTIVITAMIN WITH MINERALS) TABS tablet, Take 1 tablet by mouth daily., Disp: , Rfl:    Polyethyl Glycol-Propyl Glycol (SYSTANE) 0.4-0.3 % SOLN, Place 1 drop into both eyes daily as needed (dry eyes)., Disp: , Rfl:    Allergies  Allergen Reactions   Lovastatin Other (See Comments)    myalgias   Sudafed [Pseudoephedrine Hcl] Other (See Comments)    Hallucinations     Past Medical History:  Diagnosis Date   Allergy    Anemia    in her 20's   Aortic insufficiency    mild by echo 10/2020   Arthritis    not dx'd- just per pt    Asthma    AS A CHILD   Cancer (HCC)    endometrial cancer with hysterectomy in 1987   Cancer (HCC)    melanoma on face    Cataract    removed both eyes    DCM (dilated cardiomyopathy) (HCC)    EF 50-55%   GERD (gastroesophageal reflux disease)    occ has with diet    History of syncope    HTN (hypertension)    Hyperlipidemia    OSA on CPAP    TIA (transient ischemic attack)      Past Surgical History:  Procedure Laterality Date   ABDOMINAL HYSTERECTOMY     ANKLE FRACTURE SURGERY  APPENDECTOMY     CATARACT EXTRACTION, BILATERAL     CHOLECYSTECTOMY     COLONOSCOPY  08/01/2007   Dr Marina Goodell    DENTAL SURGERY     ovary removal     right leg  fracture surgery       Family History  Problem Relation Age of Onset   Mitral valve prolapse Daughter    Heart disease Mother    Kidney failure Mother    Diabetes Mother    Hyperlipidemia Mother    Hypertension Mother    Uterine cancer Mother    Dementia Father    Diabetes Sister    Multiple sclerosis Sister    Diabetes Paternal Grandmother    Throat cancer Brother        smoker   Colon cancer Neg Hx    Colon polyps Neg Hx    Esophageal cancer Neg Hx    Rectal cancer Neg Hx    Stomach cancer Neg Hx     Social History   Tobacco Use   Smoking status: Never   Smokeless tobacco: Never  Vaping Use   Vaping Use: Never used  Substance Use Topics   Alcohol  use: No   Drug use: No    ROS   Objective:   Vitals: BP (!) 147/61 (BP Location: Left Arm)   Pulse 68   Temp 98.1 F (36.7 C) (Oral)   Resp 20   SpO2 95%   Physical Exam Constitutional:      General: She is not in acute distress.    Appearance: Normal appearance. She is well-developed. She is not ill-appearing, toxic-appearing or diaphoretic.  HENT:     Head: Normocephalic and atraumatic.     Nose: Nose normal.     Mouth/Throat:     Mouth: Mucous membranes are moist.  Eyes:     General: No scleral icterus.       Right eye: No discharge.        Left eye: No discharge.     Extraocular Movements: Extraocular movements intact.  Cardiovascular:     Rate and Rhythm: Normal rate.  Pulmonary:     Effort: Pulmonary effort is normal.  Musculoskeletal:       Arms:  Skin:    General: Skin is warm and dry.  Neurological:     General: No focal deficit present.     Mental Status: She is alert and oriented to person, place, and time.  Psychiatric:        Mood and Affect: Mood normal.        Behavior: Behavior normal.        Thought Content: Thought content normal.        Judgment: Judgment normal.    Tdap administered in clinic.        Assessment and Plan :   PDMP not reviewed this encounter.  1. Cat bite of left forearm with infection, initial encounter   2. Need for Tdap vaccination     Creatinine clearance calculated at 27mL/min.  Start Augmentin for infected cat bite wound to the left forearm.  Discussed supportive care, counseled patient on potential for adverse effects with medications prescribed/recommended today, ER and return-to-clinic precautions discussed, patient verbalized understanding.    Wallis Bamberg, [place] 01/06/23 1751

## 2023-01-06 NOTE — ED Triage Notes (Signed)
Pt reports her cat scratched her left forearm yesterday-areas is red, warm and pt reports she expressed area with drainage return-NAD-steady gait

## 2023-02-10 ENCOUNTER — Other Ambulatory Visit: Payer: Self-pay | Admitting: Cardiology

## 2023-02-19 ENCOUNTER — Other Ambulatory Visit: Payer: Self-pay | Admitting: Cardiology

## 2023-03-02 ENCOUNTER — Ambulatory Visit: Payer: Medicare Other | Attending: Cardiology | Admitting: Cardiology

## 2023-03-02 ENCOUNTER — Encounter: Payer: Self-pay | Admitting: Cardiology

## 2023-03-02 VITALS — BP 136/60 | HR 72 | Ht 63.0 in | Wt 192.2 lb

## 2023-03-02 DIAGNOSIS — I2510 Atherosclerotic heart disease of native coronary artery without angina pectoris: Secondary | ICD-10-CM

## 2023-03-02 DIAGNOSIS — I10 Essential (primary) hypertension: Secondary | ICD-10-CM | POA: Diagnosis not present

## 2023-03-02 DIAGNOSIS — I351 Nonrheumatic aortic (valve) insufficiency: Secondary | ICD-10-CM | POA: Diagnosis not present

## 2023-03-02 DIAGNOSIS — I4719 Other supraventricular tachycardia: Secondary | ICD-10-CM

## 2023-03-02 DIAGNOSIS — I739 Peripheral vascular disease, unspecified: Secondary | ICD-10-CM

## 2023-03-02 DIAGNOSIS — E7800 Pure hypercholesterolemia, unspecified: Secondary | ICD-10-CM | POA: Diagnosis not present

## 2023-03-02 DIAGNOSIS — Z87898 Personal history of other specified conditions: Secondary | ICD-10-CM

## 2023-03-02 DIAGNOSIS — I420 Dilated cardiomyopathy: Secondary | ICD-10-CM

## 2023-03-02 NOTE — Patient Instructions (Signed)
Medication Instructions:  Your physician recommends that you continue on your current medications as directed. Please refer to the Current Medication list given to you today.  *If you need a refill on your cardiac medications before your next appointment, please call your pharmacy*   Lab Work: None.  If you have labs (blood work) drawn today and your tests are completely normal, you will receive your results only by: MyChart Message (if you have MyChart) OR A paper copy in the mail If you have any lab test that is abnormal or we need to change your treatment, we will call you to review the results.   Testing/Procedures: Your physician has requested that you have an echocardiogram. Echocardiography is a painless test that uses sound waves to create images of your heart. It provides your doctor with information about the size and shape of your heart and how well your heart's chambers and valves are working. This procedure takes approximately one hour. There are no restrictions for this procedure. Please do NOT wear cologne, perfume, aftershave, or lotions (deodorant is allowed). Please arrive 15 minutes prior to your appointment time.  Your physician has requested that you have an ankle brachial index (ABI). During this test an ultrasound and blood pressure cuff are used to evaluate the arteries that supply the arms and legs with blood. Allow thirty minutes for this exam. There are no restrictions or special instructions.    Follow-Up: At [facility], you and your health needs are our priority.  As part of our continuing mission to provide you with exceptional heart care, we have created designated Provider Care Teams.  These Care Teams include your primary Cardiologist (physician) and Advanced Practice Providers (APPs -  Physician Assistants and Nurse Practitioners) who all work together to provide you with the care you need, when you need it.  We recommend signing up for the  patient portal called "MyChart".  Sign up information is provided on this After Visit Summary.  MyChart is used to connect with patients for Virtual Visits (Telemedicine).  Patients are able to view lab/test results, encounter notes, upcoming appointments, etc.  Non-urgent messages can be sent to your provider as well.   To learn more about what you can do with MyChart, go to [email or web address].    Your next appointment:   1 year(s)  Provider:   Dr. Armanda Magic, MD

## 2023-03-02 NOTE — Addendum Note (Signed)
Addended by: Luellen Pucker on: 03/02/2023 03:16 PM   Modules accepted: Orders

## 2023-03-02 NOTE — Progress Notes (Signed)
Cardiology Office Note:    Date:  03/02/2023   ID:  Kerry [name], DOB 24-Aug-1941, MRN [id number]  PCP:  Kerry Sax, MD  Cardiologist:  None    Referring MD: Kerry [name],*   Chief Complaint  Patient presents with   Hypertension   Hyperlipidemia   Coronary Artery Disease   Aortic Insuffiency   Cardiomyopathy    History of Present Illness:    [name] is a 82 y.o. female with a hx of HTN, syncope and hyperlipidemia (statin intolerant). She also has a hx of CVA and loop recorder was recommended but patient did not want to proceed.   2D echo at that time showed mild LV dysfunction with EF 45-50% which was new and mild AS/AI.   Repeat 2D echo to 2022 showed EF 50 to 55% with G1 DD, moderate left atrial enlargement, trivial MR and mild AR.  She is here today for followup and is doing well.  She denies any chest pain or pressure,  PND, orthopnea, dizziness, palpitations or syncope. She has chronic DOE and LE edema that are stable.  She tells me that she had an injury in her legs in the past that left her with intermittent edema at times.  She also complains of a tingling that starts in her feet and travels up to her knees.  This occurs when when she is moving around.  She also gets pains in her calves at any time whether sitting or walking. She is compliant with her meds and is tolerating meds with no SE.    Past Medical History:  Diagnosis Date   Allergy    Anemia    in her 20's   Aortic insufficiency    mild by echo 10/2020   Arthritis    not dx'd- just per pt    Asthma    AS A CHILD   Cancer (HCC)    endometrial cancer with hysterectomy in 1987   Cancer (HCC)    melanoma on face    Cataract    removed both eyes    DCM (dilated cardiomyopathy) (HCC)    EF 50-55%   GERD (gastroesophageal reflux disease)    occ has with diet    History of syncope    HTN (hypertension)    Hyperlipidemia    OSA on CPAP    TIA (transient ischemic attack)      Past Surgical History:  Procedure Laterality Date   ABDOMINAL HYSTERECTOMY     ANKLE FRACTURE SURGERY     APPENDECTOMY     CATARACT EXTRACTION, BILATERAL     CHOLECYSTECTOMY     COLONOSCOPY  08/01/2007   Dr Kerry [name]    DENTAL SURGERY     ovary removal     right leg  fracture surgery       Current Medications: Current Meds  Medication Sig   acetaminophen (TYLENOL) 500 MG tablet Take 500 mg by mouth every 6 (six) hours as needed for moderate pain or headache.    amoxicillin-clavulanate (AUGMENTIN) 875-125 MG tablet Take 1 tablet by mouth 2 (two) times daily. (Patient taking differently: Take 1 tablet by mouth 2 (two) times daily. Per patient for cat bits for only 2 weeks.)   Ascorbic Acid (VITAMIN C PO) Take 1 tablet by mouth daily.   aspirin 81 MG chewable tablet Chew 81 mg by mouth every other day.   atorvastatin (LIPITOR) 20 MG tablet TAKE 1 TABLET(20 MG) BY MOUTH DAILY   carvedilol (  COREG) 12.5 MG tablet TAKE 1 TABLET(12.5 MG) BY MOUTH TWICE DAILY. KEEP APPOINTMENT FOR FURTHER REFILLS   Cholecalciferol (VITAMIN D3) 2000 units capsule Take 2,000 Units by mouth daily.   Coenzyme Q10-Vitamin E (QUNOL ULTRA COQ10 PO) Take 1 capsule by mouth daily.   Cyanocobalamin 500 MCG CHEW Take one daily   fexofenadine (ALLEGRA) 180 MG tablet Take 180 mg by mouth every morning.   hydrochlorothiazide (HYDRODIURIL) 25 MG tablet TAKE 1 TABLET (25 MG TOTAL) BY MOUTH DAILY. KEEP APPT FOR FUTURE REFILLS OR # 90 DAY SUPPLY.   lisinopril (ZESTRIL) 20 MG tablet Take 1 tablet (20 mg total) by mouth daily.   Misc Natural Products (NEURIVA PO) Take 1 tablet by mouth daily.   Multiple Vitamin (MULTIVITAMIN WITH MINERALS) TABS tablet Take 1 tablet by mouth daily.   Polyethyl Glycol-Propyl Glycol (SYSTANE) 0.4-0.3 % SOLN Place 1 drop into both eyes daily as needed (dry eyes).     Allergies:   Lovastatin and Sudafed [pseudoephedrine hcl]   Social History   Socioeconomic History   Marital status: Widowed     Spouse name: Not on file   Number of children: 2   Years of education: Not on file   Highest education level: Not on file  Occupational History   Not on file  Tobacco Use   Smoking status: Never   Smokeless tobacco: Never  Vaping Use   Vaping Use: Never used  Substance and Sexual Activity   Alcohol use: No   Drug use: No   Sexual activity: Not Currently  Other Topics Concern   Not on file  Social History Narrative   Lives at home alone   1 cup of coffee per day    Works at [employer]   Financial Resource Strain: Low Risk  (06/14/2022)   Overall Financial Resource Strain (CARDIA)    Difficulty of Paying Living Expenses: Not hard at all  Food Insecurity: No Food Insecurity (06/14/2022)   Hunger Vital Sign    Worried About Running Out of Food in the Last Year: Never true    Ran Out of Food in the Last Year: Never true  Transportation Needs: No Transportation Needs (06/14/2022)   PRAPARE - Administrator, Civil Service (Medical): No    Lack of Transportation (Non-Medical): No  Physical Activity: Sufficiently Active (06/14/2022)   Exercise Vital Sign    Days of Exercise per Week: 5 days    Minutes of Exercise per Session: 30 min  Stress: No Stress Concern Present (06/14/2022)   [employer] of Occupational Health - Occupational Stress Questionnaire    Feeling of Stress : Not at all  Social Connections: Moderately Isolated (06/14/2022)   Social Connection and Isolation Panel [NHANES]    Frequency of Communication with Friends and Family: More than three times a week    Frequency of Social Gatherings with Friends and Family: More than three times a week    Attends Religious Services: More than 4 times per year    Active Member of [employer] or Organizations: No    Attends Banker Meetings: Never    Marital Status: Widowed     Family History: The patient's family history includes Dementia in her father; Diabetes  in her mother, paternal grandmother, and sister; Heart disease in her mother; Hyperlipidemia in her mother; Hypertension in her mother; Kidney failure in her mother; Mitral valve prolapse in her daughter; Multiple sclerosis in her sister; Throat  cancer in her brother; Uterine cancer in her mother. There is no history of Colon cancer, Colon polyps, Esophageal cancer, Rectal cancer, or Stomach cancer.  ROS:   Please see the history of present illness.    ROS  All other systems reviewed and negative.   EKGs/Labs/Other Studies Reviewed:    The following studies were reviewed today:  EKG Interpretation  Date/Time:  Thursday March 02 2023 14:47:41 EDT Ventricular Rate:  72 PR Interval:  212 QRS Duration: 130 QT Interval:  398 QTC Calculation: 435 R Axis:   -63 Text Interpretation: Sinus rhythm with 1st degree A-V block with Premature supraventricular complexes Left axis deviation Left anterior fasicular block Left ventricular hypertrophy with QRS widening ( R in aVL , Cornell product , Romhilt-Estes ) Cannot rule out Septal infarct , age undetermined When compared with ECG of 24-Sep-2021 18:50, PREVIOUS ECG IS PRESENT Confirmed by Armanda Magic [phone]) on 03/02/2023 3:03:00 PM    Recent Labs: 03/14/2022: Hemoglobin 11.5; Platelets 214.0 08/12/2022: TSH 1.76 09/14/2022: BUN 19; Creatinine, Ser 0.75; Potassium 4.7; Sodium 139   Recent Lipid Panel    Component Value Date/Time   CHOL 139 09/14/2022 0840   CHOL 143 07/10/2020 1209   TRIG 86.0 09/14/2022 0840   HDL 52.80 09/14/2022 0840   HDL 65 07/10/2020 1209   CHOLHDL 3 09/14/2022 0840   VLDL 17.2 09/14/2022 0840   LDLCALC 69 09/14/2022 0840   LDLCALC 65 07/10/2020 1209   LDLDIRECT 64.0 09/23/2021 0757    Physical Exam:    VS:  BP 136/60   Pulse 72   Ht 5\' 3"  (1.6 m)   Wt 192 lb 3.2 oz (87.2 kg)   SpO2 98%   BMI 34.05 kg/m     Wt Readings from Last 3 Encounters:  03/02/23 192 lb 3.2 oz (87.2 kg)  09/30/22 195 lb (88.5 kg)   09/14/22 195 lb 6.4 oz (88.6 kg)    GEN: Well nourished, well developed in no acute distress HEENT: Normal NECK: No JVD; No carotid bruits LYMPHATICS: No lymphadenopathy CARDIAC:RRR, no murmurs, rubs, gallops RESPIRATORY:  Clear to auscultation without rales, wheezing or rhonchi  ABDOMEN: Soft, non-tender, non-distended MUSCULOSKELETAL:  No edema; No deformity  SKIN: Warm and dry NEUROLOGIC:  Alert and oriented x 3 PSYCHIATRIC:  Normal affect  ASSESSMENT:    1. Essential hypertension   2. History of syncope   3. Pure hypercholesterolemia   4. Nonrheumatic aortic valve insufficiency   5. DCM (dilated cardiomyopathy) (HCC)   6. Coronary artery disease involving native coronary artery of native heart without angina pectoris   7. PAT (paroxysmal atrial tachycardia)    PLAN:    In order of problems listed above:  1.  HTN  -Her BP is well controlled on exam today -Continue drug management with carvedilol 12.5 mg twice daily, HCTZ 25 mg daily, lisinopril 20 mg daily with as needed refills -I have personally reviewed and interpreted outside labs performed by patient's PCP which showed serum creatinine 0.75 and potassium 4.7 on 09/14/2022  2.  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  Hyperlipidemia -LDL goal less than  70  -continue prescription management with atorvastatin 20 mg daily with as needed refills -I have personally reviewed and interpreted outside labs performed by patient's PCP which showed LDL 69 and HDL 52 on 09/14/2022  4.  Aortic insufficiency -2D echo 08/2019 showed moderate AI and mild  AS -Repeat 2D echo 10/20/2020 showed mild AI with no aortic stenosis -Repeat 2D echo for follow-up  5.  Mild LV dysfunction -EF noted to be mildly decreased at 45-50% in October at time of her CVA and by repet echo 08/2019 -Repeat echo 10/20/2020 showed low normal LV function with EF 50 to 55%  6.  ASCAD -she has chronic DOE that is not felt to be related to CAD -Coronary CTA 10/2019 showed a coronary Ca score of 37 which was 40th % for age and sex matched controls and mild non obstructive plaque in the proximal LAD and RCA -She denies any anginal symptoms since I saw her last -Continue prescription drug management with aspirin 81 mg daily, atorvastatin 20 mg daily, carvedilol 12.5 mg twice daily with as needed refills   7.  Nonsustained atrial tachycardia -She denies any palpitations -Continue prescription management carvedilol 12.5 mg twice daily with as needed refills  8.  Claudication -symptoms are vague and do not always occur with exertion in her legs -will get LE ABIs to rule out PAD  Medication Adjustments/Labs and Tests Ordered: Current medicines are reviewed at length with the patient today.  Concerns regarding medicines are outlined above.  Orders Placed This Encounter  Procedures   EKG 12-Lead   No orders of the defined types were placed in this encounter.   Signed, Armanda Magic, MD  03/02/2023 3:03 PM    [facility]

## 2023-03-05 ENCOUNTER — Other Ambulatory Visit: Payer: Self-pay | Admitting: Cardiology

## 2023-03-07 NOTE — Progress Notes (Signed)
Office Visit Note  Patient: Kerry [name]             Date of Birth: [birth year]           MRN: [id number]             PCP: Mliss Sax, MD Referring: Richardean Chimera, MD Visit Date: 03/21/2023 Occupation: @GUAROCC @  Subjective:  Osteopenia  History of Present Illness: [name] is a 82 y.o. female seen in consultation per request of Dr. Arelia Sneddon for the evaluation of osteopenia.  Patient states that she had menarche at age 85 and hysterectomy at age 63.  She does not recall when she had menopause.  She states she has been active all her life and used to walk on a regular basis.  She consumes calcium rich diet and takes vitamin D on a regular basis.  She recalls having left ankle joint fracture in 1983 when she jumped in the swimming pool and required surgery on her left ankle joint.  She still have intermittent swelling in her left ankle joint.  In 1999 she states she slipped while she was going down the stairs in the hospital and fractured her tibia she states she required surgery for that.  She has no history of stress fractures.  She denies intake of thyroid medications, antiepileptics or antacids.  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  She enjoys reading and walking.  Her mother had femur fracture at age 62.  She does not recall her mother having osteoporosis.  Patient states that she has lost about 3 inches of height over the years.  She has no history of vertebral fractures.  Recently she has been experiencing paresthesias in her feet more prominent in her right than the left lower extremity.  She states she has occasional lower back pain when she bends over.  None of the other joints are painful.  She is gravida 2, para 2, miscarriages 0.    Activities of Daily Living:  Patient reports morning stiffness for less than 10 minutes.   Patient Denies nocturnal pain.  Difficulty dressing/grooming: Denies Difficulty climbing stairs:  Reports Difficulty getting out of chair: Denies Difficulty using hands for taps, buttons, cutlery, and/or writing: Reports  Review of Systems  Constitutional:  Negative for fatigue.  HENT:  Negative for mouth sores and mouth dryness.   Eyes:  Positive for dryness.  Respiratory:  Positive for shortness of breath.        Asthma  Cardiovascular:  Negative for chest pain and palpitations.  Gastrointestinal:  Positive for diarrhea. Negative for blood in stool and constipation.       Related to diet  Endocrine: Negative for increased urination.  Genitourinary:  Negative for involuntary urination.  Musculoskeletal:  Positive for gait problem, joint swelling, muscle weakness and morning stiffness. Negative for joint pain, joint pain, myalgias, muscle tenderness and myalgias.       H/o left ankle fx  Skin:  Positive for hair loss. Negative for color change, rash and sensitivity to sunlight.  Allergic/Immunologic: Negative for susceptible to infections.  Neurological:  Positive for numbness and parasthesias. Negative for dizziness and headaches.  Hematological:  Negative for swollen glands.  Psychiatric/Behavioral:  Negative for depressed mood and sleep disturbance. The patient is not nervous/anxious.     PMFS History:  Patient Active Problem List   Diagnosis Date Noted   Prediabetes 03/15/2023   History of dehydration 03/15/2023  Osteopenia 09/14/2022   Low back pain with sciatica 08/19/2022   Gait instability 08/19/2022   Hospital discharge follow-up 09/28/2021   Traumatic ecchymosis of head 09/28/2021   Chronic post-traumatic headache, not intractable 09/28/2021   Subdural hematoma (HCC) 09/24/2021   Encephalopathy acute 09/24/2021   Normocytic anemia 09/14/2021   Seasonal allergic rhinitis due to pollen 03/11/2021   DCM (dilated cardiomyopathy) (HCC)    Hyponatremia 06/21/2019   TIA (transient ischemic attack) 06/20/2019   Aortic insufficiency 08/17/2018   Hyperlipidemia     Right thyroid nodule 02/07/2013   Right carotid bruit 07/16/2012   Ocular migraine 11/04/2011   Hypercholesterolemia 06/01/2011   Essential hypertension    History of syncope    Asthma     Past Medical History:  Diagnosis Date   Allergy    Anemia    in her 20's   Aortic insufficiency    mild by echo 10/2020   Arthritis    not dx'd- just per pt    Asthma    AS A CHILD   Cancer (HCC)    endometrial cancer with hysterectomy in 1987   Cancer (HCC)    melanoma on face    Cataract    removed both eyes    DCM (dilated cardiomyopathy) (HCC)    EF 50-55%   GERD (gastroesophageal reflux disease)    occ has with diet    History of syncope    HTN (hypertension)    Hyperlipidemia    OSA on CPAP    TIA (transient ischemic attack)     Family History  Problem Relation Age of Onset   Heart disease Mother    Kidney failure Mother    Diabetes Mother    Hyperlipidemia Mother    Hypertension Mother    Uterine cancer Mother    Dementia Father    Diabetes Sister    Multiple sclerosis Sister    Stomach cancer Sister    Dementia Sister    Throat cancer Brother        smoker   Diabetes Paternal Grandmother    Mitral valve prolapse Daughter    Colon cancer Neg Hx    Colon polyps Neg Hx    Esophageal cancer Neg Hx    Rectal cancer Neg Hx    Past Surgical History:  Procedure Laterality Date   ABDOMINAL HYSTERECTOMY     ANKLE FRACTURE SURGERY Left    APPENDECTOMY     CATARACT EXTRACTION, BILATERAL     CHOLECYSTECTOMY     COLONOSCOPY  08/01/2007   Dr Marina Goodell    DENTAL SURGERY     MELANOMA EXCISION     face   ovary removal     right leg  fracture surgery      SKIN BIOPSY     pre-cancerous on face   Social History   Social History Narrative   Lives at home alone   1 cup of coffee per day    Works at [employer] History  Administered Date(s) Administered   Fluad Quad(high Dose 65+) 05/31/2022   Influenza Split 06/19/2014   Influenza, High Dose  Seasonal PF 07/16/2018   Influenza-Unspecified 05/31/2015, 06/13/2016, 06/23/2020, 07/04/2021   Moderna Sars-Covid-2 Vaccination 08/01/2020   PFIZER(Purple Top)SARS-COV-2 Vaccination 11/06/2019, 11/27/2019   Pneumococcal Conjugate-13 04/14/2017   Pneumococcal Polysaccharide-23 06/28/2009   Tdap 01/06/2023     Objective: Vital Signs: BP (!) 152/72 (BP Location: Right Arm, Patient Position: Sitting, Cuff Size: Large)   Pulse 65  Resp 14   Ht 5\' 2"  (1.575 m)   Wt 193 lb 12.8 oz (87.9 kg)   BMI 35.45 kg/m    Physical Exam Vitals and nursing note reviewed.  Constitutional:      Appearance: She is well-developed.  HENT:     Head: Normocephalic and atraumatic.  Eyes:     Conjunctiva/sclera: Conjunctivae normal.  Cardiovascular:     Rate and Rhythm: Normal rate and regular rhythm.     Heart sounds: Normal heart sounds.  Pulmonary:     Effort: Pulmonary effort is normal.     Breath sounds: Normal breath sounds.  Abdominal:     General: Bowel sounds are normal.     Palpations: Abdomen is soft.  Musculoskeletal:     Cervical back: Normal range of motion.  Lymphadenopathy:     Cervical: No cervical adenopathy.  Skin:    General: Skin is warm and dry.     Capillary Refill: Capillary refill takes less than 2 seconds.  Neurological:     Mental Status: She is alert and oriented to person, place, and time.  Psychiatric:        Behavior: Behavior normal.      Musculoskeletal Exam: Cervical spine was in good range of motion.  Thoracic kyphosis was noted.  She had no point tenderness over the thoracic or lumbar region.  Shoulder joints, elbow joints, wrist joints were in good range of motion.  She had bilateral CMC PIP and DIP thickening consistent with osteoarthritis.  Hip joints and knee joints were in good range of motion.  She had limited range of motion of bilateral ankle joints and surgical scar over bilateral ankles.  She had bilateral dorsal spurs.  PIP and DIP thickening was  noted.  CDAI Exam: CDAI Score: -- Patient Global: --; Provider Global: -- Swollen: --; Tender: -- Joint Exam 03/21/2023   No joint exam has been documented for this visit   There is currently no information documented on the homunculus. Go to the Rheumatology activity and complete the homunculus joint exam.  Investigation: No additional findings.  Imaging: No results found.  Recent Labs: Lab Results  Component Value Date   WBC 8.3 03/15/2023   HGB 11.8 (L) 03/15/2023   PLT 242.0 03/15/2023   NA 129 (L) 03/15/2023   K 4.8 03/15/2023   CL 94 (L) 03/15/2023   CO2 29 03/15/2023   GLUCOSE 96 03/15/2023   BUN 19 03/15/2023   CREATININE 0.78 03/15/2023   BILITOT 0.8 03/15/2023   ALKPHOS 98 03/15/2023   AST 21 03/15/2023   ALT 16 03/15/2023   PROT 7.2 03/15/2023   ALBUMIN 4.0 03/15/2023   CALCIUM 9.9 03/15/2023   GFRAA 82 07/10/2020   March 14, 2022 vitamin D 46.82, hemoglobin A1c 5.8  Speciality Comments: No specialty comments available.  Procedures:  No procedures performed Allergies: Lovastatin and Sudafed [pseudoephedrine hcl]   Assessment / Plan:     Visit Diagnoses: Osteopenia of multiple sites - 08/23/22: DEXA LFN -1.9. -This is patient's first DEXA scan.  There is no comparison available.  I had a detailed discussion with the patient regarding the DEXA scan.  She is in the osteopenia range.  She states she consumes calcium rich diet and also takes vitamin D.  Her last vitamin D level was 46.82 in July 2023.  Which was in the desirable range.  She also believes that she might have lost 3 inches of height over the years.  She had 2 fractures in  the past which were after injury.  There is no history of stress fractures.  Her mother had a femur fracture at age 72 after a fall.  Calcium rich diet and vitamin D was discussed.  Need for regular exercise was also emphasized.  I discussed the option of Fosamax as a preventive therapy.  Indications side effects contraindications  were discussed.  Patient is hesitant to go on Fosamax at this point.  Handouts on osteopenia and Fosamax were given for her review.  She would like to take calcium and vitamin D and exercise and repeat DEXA scan in 2 years.  She wears dentures.  She does not require any dental work.  Plan: Parathyroid hormone, intact (no Ca), VITAMIN D 25 Hydroxy (Vit-D Deficiency, Fractures), Serum protein electrophoresis with reflex, Phosphorus, TSH  Vitamin D deficiency-patient has history of vitamin D deficiency.  Unstable gait-patient has unstable gait due to prior left ankle fracture 1983 after jumping in the swimming pool and right tibia fracture in 1999 after falling from the staircase.  She required surgery.  She still has intermittent swelling and discomfort.  Patient states she had physical therapy in January for unstable gait and balance.  She is at increased risk of falling which I discussed with the patient.  Primary osteoarthritis of both hands-she has osteoarthritis in her hands bilateral CMC PIP and DIP thickening.  No synovitis was noted.  Joint protection was advised.  History of fracture of left ankle-1983 after jumping in the swimming pool requiring surgery.  She has limited range of motion and thickening of the left ankle.  Chronic midline low back pain without sciatica -she complains of intermittent lower back pain especially if she bends.  Plan: XR Lumbar Spine 2-3 Views.  X-rays showed multilevel spondylosis, L4-L5 spondylolisthesis and facet joint arthropathy.  Paresthesia of both feet-she is intermittent paresthesias in her feet more prominent in the right foot.  She had recent hemoglobin A1c which was mildly elevated at 5.8 on March 14, 2022.  Fatigue-will check TSH today.  TIA (transient ischemic attack) - 2023  Subdural hematoma (HCC) - after the fall last 2023  Essential hypertension-blood pressure was elevated at 152/72.  Repeat blood pressure was 147/70.  She was advised to monitor  blood pressure closely and follow-up with the PCP.  Other medical problems are listed as follows:  Hypercholesterolemia  DCM (dilated cardiomyopathy) (HCC)  Nonrheumatic aortic valve insufficiency  Seasonal allergic rhinitis due to pollen  Mild intermittent asthma, unspecified whether complicated  Right thyroid nodule  Ocular migraine  History of melanoma - 2019 s/p surgery  Orders: Orders Placed This Encounter  Procedures   XR Lumbar Spine 2-3 Views   Parathyroid hormone, intact (no Ca)   VITAMIN D 25 Hydroxy (Vit-D Deficiency, Fractures)   Serum protein electrophoresis with reflex   Phosphorus   TSH   No orders of the defined types were placed in this encounter.    Follow-Up Instructions: Return for Osteopenia.   Pollyann Savoy, MD  Note - This record has been created using Animal nutritionist.  Chart creation errors have been sought, but may not always  have been located. Such creation errors do not reflect on  the standard of medical care.

## 2023-03-08 ENCOUNTER — Encounter (HOSPITAL_COMMUNITY): Payer: Self-pay

## 2023-03-08 ENCOUNTER — Emergency Department (HOSPITAL_COMMUNITY)
Admission: EM | Admit: 2023-03-08 | Discharge: 2023-03-09 | Disposition: A | Discharge status: Home / Self Care | Payer: Medicare Other | Attending: Student | Admitting: Student

## 2023-03-08 ENCOUNTER — Other Ambulatory Visit: Payer: Self-pay

## 2023-03-08 DIAGNOSIS — Z85828 Personal history of other malignant neoplasm of skin: Secondary | ICD-10-CM | POA: Diagnosis not present

## 2023-03-08 DIAGNOSIS — Z7982 Long term (current) use of aspirin: Secondary | ICD-10-CM | POA: Diagnosis not present

## 2023-03-08 DIAGNOSIS — R55 Syncope and collapse: Secondary | ICD-10-CM | POA: Diagnosis not present

## 2023-03-08 DIAGNOSIS — J45909 Unspecified asthma, uncomplicated: Secondary | ICD-10-CM | POA: Insufficient documentation

## 2023-03-08 DIAGNOSIS — I10 Essential (primary) hypertension: Secondary | ICD-10-CM | POA: Insufficient documentation

## 2023-03-08 DIAGNOSIS — I959 Hypotension, unspecified: Secondary | ICD-10-CM | POA: Diagnosis not present

## 2023-03-08 DIAGNOSIS — Z79899 Other long term (current) drug therapy: Secondary | ICD-10-CM | POA: Insufficient documentation

## 2023-03-08 DIAGNOSIS — R110 Nausea: Secondary | ICD-10-CM | POA: Diagnosis not present

## 2023-03-08 DIAGNOSIS — Z8541 Personal history of malignant neoplasm of cervix uteri: Secondary | ICD-10-CM | POA: Insufficient documentation

## 2023-03-08 LAB — CBC WITH DIFFERENTIAL/PLATELET
Abs Immature Granulocytes: 0.02 10*3/uL (ref 0.00–0.07)
Basophils Absolute: 0.1 10*3/uL (ref 0.0–0.1)
Basophils Relative: 1 %
Eosinophils Absolute: 0.5 10*3/uL (ref 0.0–0.5)
Eosinophils Relative: 4 %
HCT: 32.3 % — ABNORMAL LOW (ref 36.0–46.0)
Hemoglobin: 10.9 g/dL — ABNORMAL LOW (ref 12.0–15.0)
Immature Granulocytes: 0 %
Lymphocytes Relative: 14 %
Lymphs Abs: 1.6 10*3/uL (ref 0.7–4.0)
MCH: 31 pg (ref 26.0–34.0)
MCHC: 33.7 g/dL (ref 30.0–36.0)
MCV: 91.8 fL (ref 80.0–100.0)
Monocytes Absolute: 1 10*3/uL (ref 0.1–1.0)
Monocytes Relative: 9 %
Neutro Abs: 8.3 10*3/uL — ABNORMAL HIGH (ref 1.7–7.7)
Neutrophils Relative %: 72 %
Platelets: 199 10*3/uL (ref 150–400)
RBC: 3.52 MIL/uL — ABNORMAL LOW (ref 3.87–5.11)
RDW: 12 % (ref 11.5–15.5)
WBC: 11.5 10*3/uL — ABNORMAL HIGH (ref 4.0–10.5)
nRBC: 0 % (ref 0.0–0.2)

## 2023-03-08 LAB — COMPREHENSIVE METABOLIC PANEL
ALT: 16 U/L (ref 0–44)
AST: 23 U/L (ref 15–41)
Albumin: 3.3 g/dL — ABNORMAL LOW (ref 3.5–5.0)
Alkaline Phosphatase: 81 U/L (ref 38–126)
Anion gap: 8 (ref 5–15)
BUN: 21 mg/dL (ref 8–23)
CO2: 24 mmol/L (ref 22–32)
Calcium: 8.7 mg/dL — ABNORMAL LOW (ref 8.9–10.3)
Chloride: 97 mmol/L — ABNORMAL LOW (ref 98–111)
Creatinine, Ser: 0.99 mg/dL (ref 0.44–1.00)
GFR, Estimated: 57 mL/min — ABNORMAL LOW (ref >60)
Glucose, Bld: 129 mg/dL — ABNORMAL HIGH (ref 70–99)
Potassium: 3.8 mmol/L (ref 3.5–5.1)
Sodium: 129 mmol/L — ABNORMAL LOW (ref 135–145)
Total Bilirubin: 0.8 mg/dL (ref 0.3–1.2)
Total Protein: 6.5 g/dL (ref 6.5–8.1)

## 2023-03-08 LAB — TROPONIN I (HIGH SENSITIVITY): Troponin I (High Sensitivity): 7 ng/L (ref <18)

## 2023-03-08 MED ORDER — LACTATED RINGERS IV BOLUS
1000.0000 mL | Freq: Once | INTRAVENOUS | Status: AC
  Administered 2023-03-08: 1000 mL via INTRAVENOUS

## 2023-03-08 NOTE — ED Triage Notes (Signed)
Pt BIB [facility] from work ([employer]) with c/o presyncopal episode. Pt got really hot and needed to sit down. Pt found to be hypotensive with intial BP of 80/40. 18 ga L AC. 500 cc IV NS given en route with improvement.

## 2023-03-08 NOTE — ED Provider Notes (Signed)
  Provider Note MRN:  [id number]  Arrival date & time: 03/09/23    ED Course and Medical Decision Making  Assumed care from Dr. Posey Rea at shift change.  Lightheadedness, felt overheated, diaphoretic while folding clothes, feeling better now, did not fall or pass out.  Candidate for discharge if workup reassuring.  1:30 AM update: Patient feeling better and wants to go home.  We discussed the option of admission for observation, cardiac monitoring.  We discussed the pros and cons.  Overall I do not see a great benefit from admission given that we have a good explanation for patient's near syncopal event.  She agrees that she would rather go home.  Strict return precautions.  Procedures  Final Clinical Impressions(s) / ED Diagnoses     ICD-10-CM   1. Near syncope  R55       ED Discharge Orders     None         Discharge Instructions      You were evaluated in the Emergency Department and after careful evaluation, we did not find any emergent condition requiring admission or further testing in the hospital.  Your exam/testing today is overall reassuring.  Symptoms may have been caused by mild dehydration.  Recommend continued hydration at home, close follow-up with your regular doctor.  Please return to the Emergency Department if you experience any worsening of your condition.   Thank you for allowing [place] to be a part of your care.      Elmer Sow. Pilar Plate, MD [facility] Emergency Medicine [facility] [email or web address]    Sabas Sous, MD 03/09/23 [phone]

## 2023-03-08 NOTE — ED Provider Notes (Signed)
[place] EMERGENCY DEPARTMENT AT [facility] Provider Note  CSN: [id number] Arrival date & time: 03/08/23 2138  Chief Complaint(s) Near Syncope  HPI [name] is a 82 y.o. female with PMH HTN, previous syncope, TIA who presents emergency department for evaluation of a near syncopal episode.  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  She started to have some diaphoresis and hot flashes and was found to be hypotensive by EMS in the field with blood pressures 80/40.  Here in the emergency room, patient is asymptomatic but does arrive with softer blood pressures of 92/46.  Denies associated chest pain, shortness of breath, Donnell pain, nausea, vomiting or other systemic symptoms.   Past Medical History Past Medical History:  Diagnosis Date   Allergy    Anemia    in her 20's   Aortic insufficiency    mild by echo 10/2020   Arthritis    not dx'd- just per pt    Asthma    AS A CHILD   Cancer (HCC)    endometrial cancer with hysterectomy in 1987   Cancer (HCC)    melanoma on face    Cataract    removed both eyes    DCM (dilated cardiomyopathy) (HCC)    EF 50-55%   GERD (gastroesophageal reflux disease)    occ has with diet    History of syncope    HTN (hypertension)    Hyperlipidemia    OSA on CPAP    TIA (transient ischemic attack)    Patient Active Problem List   Diagnosis Date Noted   Osteopenia 09/14/2022   Low back pain with sciatica 08/19/2022   Gait instability 08/19/2022   Hospital discharge follow-up 09/28/2021   Traumatic ecchymosis of head 09/28/2021   Chronic post-traumatic headache, not intractable 09/28/2021   Subdural hematoma (HCC) 09/24/2021   Encephalopathy acute 09/24/2021   Normocytic anemia 09/14/2021   Seasonal allergic rhinitis due to pollen 03/11/2021   DCM (dilated cardiomyopathy) (HCC)    Hyponatremia 06/21/2019   TIA (transient ischemic attack) 06/20/2019    Aortic insufficiency 08/17/2018   Hyperlipidemia    Right thyroid nodule 02/07/2013   Right carotid bruit 07/16/2012   Ocular migraine 11/04/2011   Hypercholesterolemia 06/01/2011   Essential hypertension    History of syncope    Asthma    Home Medication(s) Prior to Admission medications   Medication Sig Start Date End Date Taking? Authorizing Provider  acetaminophen (TYLENOL) 500 MG tablet Take 500 mg by mouth every 6 (six) hours as needed for moderate pain or headache.     [provider]  amoxicillin-clavulanate (AUGMENTIN) 875-125 MG tablet Take 1 tablet by mouth 2 (two) times daily. Patient taking differently: Take 1 tablet by mouth 2 (two) times daily. Per patient for cat bits for only 2 weeks. 01/06/23   Wallis Bamberg, PA-C  Ascorbic Acid (VITAMIN C PO) Take 1 tablet by mouth daily.    [provider]  aspirin 81 MG chewable tablet Chew 81 mg by mouth every other day.    [provider]  atorvastatin (LIPITOR) 20 MG tablet TAKE 1 TABLET(20 MG) BY MOUTH DAILY 02/10/23   Turner, Cornelious Bryant, MD  carvedilol (COREG) 12.5 MG tablet TAKE 1 TABLET(12.5 MG) BY MOUTH TWICE DAILY. KEEP APPOINTMENT FOR FURTHER REFILLS 03/07/23   Quintella Reichert, MD  Cholecalciferol (VITAMIN D3) 2000 units capsule Take 2,000 Units by mouth daily.  [provider]  Coenzyme Q10-Vitamin E (QUNOL ULTRA COQ10 PO) Take 1 capsule by mouth daily.    [provider]  Cyanocobalamin 500 MCG CHEW Take one daily 10/26/21   Mliss Sax, MD  fexofenadine (ALLEGRA) 180 MG tablet Take 180 mg by mouth every morning.    [provider]  hydrochlorothiazide (HYDRODIURIL) 25 MG tablet TAKE 1 TABLET (25 MG TOTAL) BY MOUTH DAILY. KEEP APPT FOR FUTURE REFILLS OR # 90 DAY SUPPLY. 12/19/22   Quintella Reichert, MD  lisinopril (ZESTRIL) 20 MG tablet Take 1 tablet (20 mg total) by mouth daily. 08/12/22   Mliss Sax, MD  Misc Natural Products (NEURIVA PO) Take 1 tablet by  mouth daily.    [provider]  Multiple Vitamin (MULTIVITAMIN WITH MINERALS) TABS tablet Take 1 tablet by mouth daily.    [provider]  Polyethyl Glycol-Propyl Glycol (SYSTANE) 0.4-0.3 % SOLN Place 1 drop into both eyes daily as needed (dry eyes).    [provider]                                                                                                                                    Past Surgical History Past Surgical History:  Procedure Laterality Date   ABDOMINAL HYSTERECTOMY     ANKLE FRACTURE SURGERY     APPENDECTOMY     CATARACT EXTRACTION, BILATERAL     CHOLECYSTECTOMY     COLONOSCOPY  08/01/2007   Dr Marina Goodell    DENTAL SURGERY     ovary removal     right leg  fracture surgery      Family History Family History  Problem Relation Age of Onset   Mitral valve prolapse Daughter    Heart disease Mother    Kidney failure Mother    Diabetes Mother    Hyperlipidemia Mother    Hypertension Mother    Uterine cancer Mother    Dementia Father    Diabetes Sister    Multiple sclerosis Sister    Diabetes Paternal Grandmother    Throat cancer Brother        smoker   Colon cancer Neg Hx    Colon polyps Neg Hx    Esophageal cancer Neg Hx    Rectal cancer Neg Hx    Stomach cancer Neg Hx     Social History Social History   Tobacco Use   Smoking status: Never   Smokeless tobacco: Never  Vaping Use   Vaping Use: Never used  Substance Use Topics   Alcohol use: No   Drug use: No   Allergies Lovastatin and Sudafed [pseudoephedrine hcl]  Review of Systems Review of Systems  Neurological:  Positive for syncope and light-headedness.    Physical Exam Vital Signs  I have reviewed the triage vital signs BP (!) 92/46 (BP Location: Right Arm)   Pulse 71   Temp 97.9 F (36.6 C) (  Oral)   Resp (!) 22   SpO2 97%   Physical Exam Vitals and nursing note reviewed.  Constitutional:      General: She is not in acute distress.     Appearance: She is well-developed.  HENT:     Head: Normocephalic and atraumatic.  Eyes:     Conjunctiva/sclera: Conjunctivae normal.  Cardiovascular:     Rate and Rhythm: Normal rate and regular rhythm.     Heart sounds: No murmur heard. Pulmonary:     Effort: Pulmonary effort is normal. No respiratory distress.     Breath sounds: Normal breath sounds.  Abdominal:     Palpations: Abdomen is soft.     Tenderness: There is no abdominal tenderness.  Musculoskeletal:        General: No swelling.     Cervical back: Neck supple.  Skin:    General: Skin is warm and dry.     Capillary Refill: Capillary refill takes less than 2 seconds.  Neurological:     Mental Status: She is alert.  Psychiatric:        Mood and Affect: Mood normal.     ED Results and Treatments Labs (all labs ordered are listed, but only abnormal results are displayed) Labs Reviewed  COMPREHENSIVE METABOLIC PANEL  CBC WITH DIFFERENTIAL/PLATELET  TROPONIN I (HIGH SENSITIVITY)                                                                                                                          Radiology No results found.  Pertinent labs & imaging results that were available during my care of the patient were reviewed by me and considered in my medical decision making (see MDM for details).  Medications Ordered in ED Medications  lactated ringers bolus 1,000 mL (has no administration in time range)                                                                                                                                     Procedures Procedures  (including critical care time)  Medical Decision Making / ED Course   This patient presents to the ED for concern of ***, this involves an extensive number of treatment options, and is a complaint that carries with it a high risk of complications and morbidity.  The differential diagnosis includes ***  MDM: ***   Additional history  obtained: -Additional history  obtained from *** -External records from outside source obtained and reviewed including: Chart review including previous notes, labs, imaging, consultation notes   Lab Tests: -I ordered, reviewed, and interpreted labs.   The pertinent results include:   Labs Reviewed  COMPREHENSIVE METABOLIC PANEL  CBC WITH DIFFERENTIAL/PLATELET  TROPONIN I (HIGH SENSITIVITY)      EKG ***  EKG Interpretation  Date/Time:    Ventricular Rate:    PR Interval:    QRS Duration:   QT Interval:    QTC Calculation:   R Axis:     Text Interpretation:           Imaging Studies ordered: I ordered imaging studies including *** I independently visualized and interpreted imaging. I agree with the radiologist interpretation   Medicines ordered and prescription drug management: Meds ordered this encounter  Medications   lactated ringers bolus 1,000 mL    -I have reviewed the patients home medicines and have made adjustments as needed  Critical interventions ***  Consultations Obtained: I requested consultation with the ***,  and discussed lab and imaging findings as well as pertinent plan - they recommend: ***   Cardiac Monitoring: The patient was maintained on a cardiac monitor.  I personally viewed and interpreted the cardiac monitored which showed an underlying rhythm of: ***  Social Determinants of Health:  Factors impacting patients care include: ***   Reevaluation: After the interventions noted above, I reevaluated the patient and found that they have :{resolved/improved/worsened:23923::"improved"}  Co morbidities that complicate the patient evaluation  Past Medical History:  Diagnosis Date   Allergy    Anemia    in her 20's   Aortic insufficiency    mild by echo 10/2020   Arthritis    not dx'd- just per pt    Asthma    AS A CHILD   Cancer (HCC)    endometrial cancer with hysterectomy in 1987   Cancer (HCC)    melanoma on face     Cataract    removed both eyes    DCM (dilated cardiomyopathy) (HCC)    EF 50-55%   GERD (gastroesophageal reflux disease)    occ has with diet    History of syncope    HTN (hypertension)    Hyperlipidemia    OSA on CPAP    TIA (transient ischemic attack)       Dispostion: I considered admission for this patient, ***     Final Clinical Impression(s) / ED Diagnoses Final diagnoses:  None     @PCDICTATION @

## 2023-03-09 ENCOUNTER — Telehealth: Payer: Self-pay

## 2023-03-09 DIAGNOSIS — L570 Actinic keratosis: Secondary | ICD-10-CM | POA: Diagnosis not present

## 2023-03-09 LAB — TROPONIN I (HIGH SENSITIVITY): Troponin I (High Sensitivity): 7 ng/L (ref <18)

## 2023-03-09 NOTE — Discharge Instructions (Signed)
You were evaluated in the Emergency Department and after careful evaluation, we did not find any emergent condition requiring admission or further testing in the hospital.  Your exam/testing today is overall reassuring.  Symptoms may have been caused by mild dehydration.  Recommend continued hydration at home, close follow-up with your regular doctor.  Please return to the Emergency Department if you experience any worsening of your condition.   Thank you for allowing [place] to be a part of your care.

## 2023-03-09 NOTE — Transitions of Care (Post Inpatient/ED Visit) (Unsigned)
   03/09/2023  Name: Kerry [name] MRN: [id number] DOB: 13-Jul-1941  Today's TOC FU Call Status: Today's TOC FU Call Status:: Unsuccessul Call (1st Attempt) Unsuccessful Call (1st Attempt) Date: 03/09/23  Attempted to reach the patient regarding the most recent Inpatient/ED visit.  Follow Up Plan: Additional outreach attempts will be made to reach the patient to complete the Transitions of Care (Post Inpatient/ED visit) call.   Signature Arvil Persons, BSN, Charity fundraiser

## 2023-03-10 NOTE — Transitions of Care (Post Inpatient/ED Visit) (Unsigned)
   03/10/2023  Name: Kerry [name] MRN: [id number] DOB: Aug 05, 1941  Today's TOC FU Call Status: Today's TOC FU Call Status:: Unsuccessful Call (2nd Attempt) Unsuccessful Call (1st Attempt) Date: 03/09/23 Unsuccessful Call (2nd Attempt) Date: 03/10/23  Attempted to reach the patient regarding the most recent Inpatient/ED visit.  Follow Up Plan: Additional outreach attempts will be made to reach the patient to complete the Transitions of Care (Post Inpatient/ED visit) call.   Signature Arvil Persons, BSN, Charity fundraiser

## 2023-03-14 ENCOUNTER — Other Ambulatory Visit: Payer: Self-pay | Admitting: Cardiology

## 2023-03-14 NOTE — Transitions of Care (Post Inpatient/ED Visit) (Signed)
   03/14/2023  Name: Kerry [name] MRN: [id number] DOB: 1940/10/16  Today's TOC FU Call Status: Today's TOC FU Call Status:: Unsuccessful Call (3rd Attempt) Unsuccessful Call (1st Attempt) Date: 03/09/23 Unsuccessful Call (2nd Attempt) Date: 03/10/23 Unsuccessful Call (3rd Attempt) Date: 03/14/23  Attempted to reach the patient regarding the most recent Inpatient/ED visit.  Follow Up Plan: No further outreach attempts will be made at this time. We have been unable to contact the patient.  Signature Arvil Persons, BSN, Charity fundraiser

## 2023-03-15 ENCOUNTER — Encounter: Payer: Self-pay | Admitting: Family Medicine

## 2023-03-15 ENCOUNTER — Ambulatory Visit (INDEPENDENT_AMBULATORY_CARE_PROVIDER_SITE_OTHER): Payer: Medicare Other | Admitting: Family Medicine

## 2023-03-15 VITALS — BP 130/64 | HR 64 | Temp 95.2°F | Ht 63.0 in | Wt 191.6 lb

## 2023-03-15 DIAGNOSIS — Z8639 Personal history of other endocrine, nutritional and metabolic disease: Secondary | ICD-10-CM | POA: Diagnosis not present

## 2023-03-15 DIAGNOSIS — R7303 Prediabetes: Secondary | ICD-10-CM | POA: Insufficient documentation

## 2023-03-15 DIAGNOSIS — I10 Essential (primary) hypertension: Secondary | ICD-10-CM

## 2023-03-15 DIAGNOSIS — E7800 Pure hypercholesterolemia, unspecified: Secondary | ICD-10-CM | POA: Diagnosis not present

## 2023-03-15 LAB — URINALYSIS, ROUTINE W REFLEX MICROSCOPIC
Bilirubin Urine: NEGATIVE
Hgb urine dipstick: NEGATIVE
Ketones, ur: NEGATIVE
Leukocytes,Ua: NEGATIVE
Nitrite: NEGATIVE
RBC / HPF: NONE SEEN (ref 0–?)
Specific Gravity, Urine: <=1.005 — AB (ref 1.000–1.030)
Total Protein, Urine: NEGATIVE
Urine Glucose: NEGATIVE
Urobilinogen, UA: 0.2 (ref 0.0–1.0)
WBC, UA: NONE SEEN (ref 0–?)
pH: 6.5 (ref 5.0–8.0)

## 2023-03-15 LAB — COMPREHENSIVE METABOLIC PANEL
ALT: 16 U/L (ref 0–35)
AST: 21 U/L (ref 0–37)
Albumin: 4 g/dL (ref 3.5–5.2)
Alkaline Phosphatase: 98 U/L (ref 39–117)
BUN: 19 mg/dL (ref 6–23)
CO2: 29 mEq/L (ref 19–32)
Calcium: 9.9 mg/dL (ref 8.4–10.5)
Chloride: 94 mEq/L — ABNORMAL LOW (ref 96–112)
Creatinine, Ser: 0.78 mg/dL (ref 0.40–1.20)
GFR: 71.29 mL/min (ref >60.00)
Glucose, Bld: 96 mg/dL (ref 70–99)
Potassium: 4.8 mEq/L (ref 3.5–5.1)
Sodium: 129 mEq/L — ABNORMAL LOW (ref 135–145)
Total Bilirubin: 0.8 mg/dL (ref 0.2–1.2)
Total Protein: 7.2 g/dL (ref 6.0–8.3)

## 2023-03-15 LAB — CBC WITH DIFFERENTIAL/PLATELET
Basophils Absolute: 0.1 10*3/uL (ref 0.0–0.1)
Basophils Relative: 1 % (ref 0.0–3.0)
Eosinophils Absolute: 0.5 10*3/uL (ref 0.0–0.7)
Eosinophils Relative: 5.9 % — ABNORMAL HIGH (ref 0.0–5.0)
HCT: 35.2 % — ABNORMAL LOW (ref 36.0–46.0)
Hemoglobin: 11.8 g/dL — ABNORMAL LOW (ref 12.0–15.0)
Lymphocytes Relative: 19.5 % (ref 12.0–46.0)
Lymphs Abs: 1.6 10*3/uL (ref 0.7–4.0)
MCHC: 33.5 g/dL (ref 30.0–36.0)
MCV: 90.4 fl (ref 78.0–100.0)
Monocytes Absolute: 0.7 10*3/uL (ref 0.1–1.0)
Monocytes Relative: 8.2 % (ref 3.0–12.0)
Neutro Abs: 5.4 10*3/uL (ref 1.4–7.7)
Neutrophils Relative %: 65.4 % (ref 43.0–77.0)
Platelets: 242 10*3/uL (ref 150.0–400.0)
RBC: 3.89 Mil/uL (ref 3.87–5.11)
RDW: 12.9 % (ref 11.5–15.5)
WBC: 8.3 10*3/uL (ref 4.0–10.5)

## 2023-03-15 LAB — LIPID PANEL
Cholesterol: 135 mg/dL (ref 0–200)
HDL: 54.4 mg/dL (ref >39.00)
LDL Cholesterol: 62 mg/dL (ref 0–99)
NonHDL: 80.1
Total CHOL/HDL Ratio: 2
Triglycerides: 90 mg/dL (ref 0.0–149.0)
VLDL: 18 mg/dL (ref 0.0–40.0)

## 2023-03-15 LAB — HEMOGLOBIN A1C: Hgb A1c MFr Bld: 5.8 % (ref 4.6–6.5)

## 2023-03-15 MED ORDER — LISINOPRIL 20 MG PO TABS: 20.0000 mg | ORAL_TABLET | Freq: Every day | ORAL | 3 refills | Status: DC

## 2023-03-15 NOTE — Progress Notes (Signed)
Established Patient Office Visit   Subjective:  Patient ID: Kerry [name], female    DOB: 1940-11-21  Age: 82 y.o. MRN: [id number]  Chief Complaint  Patient presents with   Medical Management of Chronic Issues    6 month follow up. Pt is fasting. Pt complains of tingling and cramping in right leg and calf x 6 months.     HPI Encounter Diagnoses  Name Primary?   Hypercholesterolemia Yes   Essential hypertension    Prediabetes    History of dehydration    For follow-up on the above status post recent ER visit for syncopal spell and diagnosed with dehydration.  She admits that adequate hydration can be difficult.  She is taking all of her medications as directed.  Discussed elevated A1c.  She has no history of diabetes.  There is a family history of it in her mother and sister.  Saw cardiology and they have referred her to orthopedics rheumatologist.   Review of Systems  Constitutional: Negative.   HENT: Negative.    Eyes:  Negative for blurred vision, discharge and redness.  Respiratory: Negative.    Cardiovascular: Negative.   Gastrointestinal:  Negative for abdominal pain.  Genitourinary: Negative.   Musculoskeletal: Negative.  Negative for myalgias.  Skin:  Negative for rash.  Neurological:  Negative for tingling, loss of consciousness and weakness.  Endo/Heme/Allergies:  Negative for polydipsia.     Current Outpatient Medications:    acetaminophen (TYLENOL) 500 MG tablet, Take 500 mg by mouth every 6 (six) hours as needed for moderate pain or headache. , Disp: , Rfl:    Ascorbic Acid (VITAMIN C PO), Take 1 tablet by mouth daily., Disp: , Rfl:    aspirin 81 MG chewable tablet, Chew 81 mg by mouth every other day., Disp: , Rfl:    atorvastatin (LIPITOR) 20 MG tablet, TAKE 1 TABLET(20 MG) BY MOUTH DAILY, Disp: 90 tablet, Rfl: 0   carvedilol (COREG) 12.5 MG tablet, TAKE 1 TABLET(12.5 MG) BY MOUTH TWICE DAILY. KEEP APPOINTMENT FOR FURTHER REFILLS, Disp: 180 tablet, Rfl: 3    Cholecalciferol (VITAMIN D3) 2000 units capsule, Take 2,000 Units by mouth daily., Disp: , Rfl:    Coenzyme Q10-Vitamin E (QUNOL ULTRA COQ10 PO), Take 1 capsule by mouth daily., Disp: , Rfl:    Cyanocobalamin 500 MCG CHEW, Take one daily, Disp: 90 tablet, Rfl: 1   fexofenadine (ALLEGRA) 180 MG tablet, Take 180 mg by mouth every morning., Disp: , Rfl:    hydrochlorothiazide (HYDRODIURIL) 25 MG tablet, Take 1 tablet (25 mg total) by mouth daily., Disp: 90 tablet, Rfl: 3   Misc Natural Products (NEURIVA PO), Take 1 tablet by mouth daily., Disp: , Rfl:    Multiple Vitamin (MULTIVITAMIN WITH MINERALS) TABS tablet, Take 1 tablet by mouth daily., Disp: , Rfl:    Polyethyl Glycol-Propyl Glycol (SYSTANE) 0.4-0.3 % SOLN, Place 1 drop into both eyes daily as needed (dry eyes)., Disp: , Rfl:    amoxicillin-clavulanate (AUGMENTIN) 875-125 MG tablet, Take 1 tablet by mouth 2 (two) times daily. (Patient taking differently: Take 1 tablet by mouth 2 (two) times daily. Per patient for cat bits for only 2 weeks.), Disp: 20 tablet, Rfl: 0   lisinopril (ZESTRIL) 20 MG tablet, Take 1 tablet (20 mg total) by mouth daily., Disp: 90 tablet, Rfl: 3   Objective:     BP 130/64   Pulse 64   Temp (!) 95.2 F (35.1 C)   Ht 5\' 3"  (1.6 m)  Wt 191 lb 9.6 oz (86.9 kg)   SpO2 97%   BMI 33.94 kg/m    Physical Exam Constitutional:      General: She is not in acute distress.    Appearance: Normal appearance. She is not ill-appearing, toxic-appearing or diaphoretic.  HENT:     Head: Normocephalic and atraumatic.     Right Ear: External ear normal.     Left Ear: External ear normal.  Eyes:     General: No scleral icterus.       Right eye: No discharge.        Left eye: No discharge.     Extraocular Movements: Extraocular movements intact.     Conjunctiva/sclera: Conjunctivae normal.  Cardiovascular:     Rate and Rhythm: Normal rate and regular rhythm.  Pulmonary:     Effort: Pulmonary effort is normal. No  respiratory distress.     Breath sounds: Normal breath sounds. No wheezing, rhonchi or rales.  Abdominal:     General: Bowel sounds are normal.     Tenderness: There is no abdominal tenderness. There is no guarding.  Musculoskeletal:     Cervical back: No rigidity or tenderness.  Skin:    General: Skin is warm and dry.  Neurological:     Mental Status: She is alert and oriented to person, place, and time.  Psychiatric:        Mood and Affect: Mood normal.        Behavior: Behavior normal.      No results found for any visits on 03/15/23.    The ASCVD Risk score (Arnett DK, et al., 2019) failed to calculate for the following reasons:   The 2019 ASCVD risk score is only valid for ages 81 to 59   The patient has a prior MI or stroke diagnosis    Assessment & Plan:   Hypercholesterolemia -     Comprehensive metabolic panel -     Lipid panel  Essential hypertension -     Lisinopril; Take 1 tablet (20 mg total) by mouth daily.  Dispense: 90 tablet; Refill: 3 -     Comprehensive metabolic panel -     CBC with Differential/Platelet -     Urinalysis, Routine w reflex microscopic  Prediabetes -     Comprehensive metabolic panel -     Hemoglobin A1c  History of dehydration    Return in about 3 months (around 06/15/2023).  Discussed starting metformin pending results of A1c.  Information given on prediabetes.  Encouraged hydration and discussed challenges associated with staying hydrated.  Mliss Sax, MD

## 2023-03-17 ENCOUNTER — Telehealth: Payer: Self-pay | Admitting: Family Medicine

## 2023-03-17 NOTE — Telephone Encounter (Signed)
Pt is wanting a cb concerning her most recent labs. Please advise pt at [phone]

## 2023-03-21 ENCOUNTER — Ambulatory Visit (INDEPENDENT_AMBULATORY_CARE_PROVIDER_SITE_OTHER): Payer: Medicare Other

## 2023-03-21 ENCOUNTER — Ambulatory Visit: Payer: Medicare Other | Attending: Rheumatology | Admitting: Rheumatology

## 2023-03-21 ENCOUNTER — Encounter: Payer: Self-pay | Admitting: Rheumatology

## 2023-03-21 VITALS — BP 151/70 | HR 64 | Resp 14 | Ht 62.0 in | Wt 193.8 lb

## 2023-03-21 DIAGNOSIS — E559 Vitamin D deficiency, unspecified: Secondary | ICD-10-CM

## 2023-03-21 DIAGNOSIS — R5383 Other fatigue: Secondary | ICD-10-CM | POA: Diagnosis not present

## 2023-03-21 DIAGNOSIS — J4520 Mild intermittent asthma, uncomplicated: Secondary | ICD-10-CM

## 2023-03-21 DIAGNOSIS — M545 Low back pain, unspecified: Secondary | ICD-10-CM

## 2023-03-21 DIAGNOSIS — G8929 Other chronic pain: Secondary | ICD-10-CM

## 2023-03-21 DIAGNOSIS — I351 Nonrheumatic aortic (valve) insufficiency: Secondary | ICD-10-CM

## 2023-03-21 DIAGNOSIS — M8589 Other specified disorders of bone density and structure, multiple sites: Secondary | ICD-10-CM | POA: Diagnosis not present

## 2023-03-21 DIAGNOSIS — R2681 Unsteadiness on feet: Secondary | ICD-10-CM | POA: Diagnosis not present

## 2023-03-21 DIAGNOSIS — Z8781 Personal history of (healed) traumatic fracture: Secondary | ICD-10-CM

## 2023-03-21 DIAGNOSIS — G459 Transient cerebral ischemic attack, unspecified: Secondary | ICD-10-CM

## 2023-03-21 DIAGNOSIS — G9340 Encephalopathy, unspecified: Secondary | ICD-10-CM

## 2023-03-21 DIAGNOSIS — Z85820 Personal history of malignant melanoma of skin: Secondary | ICD-10-CM

## 2023-03-21 DIAGNOSIS — M19041 Primary osteoarthritis, right hand: Secondary | ICD-10-CM

## 2023-03-21 DIAGNOSIS — I420 Dilated cardiomyopathy: Secondary | ICD-10-CM

## 2023-03-21 DIAGNOSIS — E041 Nontoxic single thyroid nodule: Secondary | ICD-10-CM

## 2023-03-21 DIAGNOSIS — G43109 Migraine with aura, not intractable, without status migrainosus: Secondary | ICD-10-CM

## 2023-03-21 DIAGNOSIS — E7800 Pure hypercholesterolemia, unspecified: Secondary | ICD-10-CM

## 2023-03-21 DIAGNOSIS — J301 Allergic rhinitis due to pollen: Secondary | ICD-10-CM

## 2023-03-21 DIAGNOSIS — I10 Essential (primary) hypertension: Secondary | ICD-10-CM

## 2023-03-21 DIAGNOSIS — M19042 Primary osteoarthritis, left hand: Secondary | ICD-10-CM

## 2023-03-21 DIAGNOSIS — R202 Paresthesia of skin: Secondary | ICD-10-CM

## 2023-03-21 DIAGNOSIS — S065XAA Traumatic subdural hemorrhage with loss of consciousness status unknown, initial encounter: Secondary | ICD-10-CM

## 2023-03-21 NOTE — Patient Instructions (Signed)
Osteopenia  Osteopenia is a loss of thickness (density) inside the bones. Another name for osteopenia is low bone mass. Mild osteopenia is a normal part of aging. It is not a disease, and it does not cause symptoms. However, if you have osteopenia and continue to lose bone mass, you could develop a condition that causes the bones to become thin and break more easily (osteoporosis). Osteoporosis can cause you to lose some height, have back pain, and have a stooped posture. Although osteopenia is not a disease, making changes to your lifestyle and diet can help to prevent osteopenia from developing into osteoporosis. What are the causes? Osteopenia is caused by loss of calcium in the bones. Bones are constantly changing. Old bone cells are continually being replaced with new bone cells. This process builds new bone. The mineral calcium is needed to build new bone and maintain bone density. Bone density is usually highest around age 50. After that, most people's bodies cannot replace all the bone they have lost with new bone. What increases the risk? You are more likely to develop this condition if: You are older than age 44. You are a woman who went through menopause early. You have a long illness that keeps you in bed. You do not get enough exercise. You lack certain nutrients (malnutrition). You have an overactive thyroid gland (hyperthyroidism). You use products that contain nicotine or tobacco, such as cigarettes, e-cigarettes and chewing tobacco, or you drink a lot of alcohol. You are taking medicines that weaken the bones, such as steroids. What are the signs or symptoms? This condition does not cause any symptoms. You may have a slightly higher risk for bone breaks (fractures), so getting fractures more easily than normal may be an indication of osteopenia. How is this diagnosed? This condition may be diagnosed based on an X-ray exam that measures bone density (dual-energy X-ray  absorptiometry, or DEXA). This test can measure bone density in your hips, spine, and wrists. Osteopenia has no symptoms, so this condition is usually diagnosed after a routine bone density screening test is done for osteoporosis. This routine screening is usually done for: Women who are age 16 or older. Men who are age 29 or older. If you have risk factors for osteopenia, you may have the screening test at an earlier age. How is this treated? Making dietary and lifestyle changes can lower your risk for osteoporosis. If you have severe osteopenia that is close to becoming osteoporosis, this condition can be treated with medicines and dietary supplements such as calcium and vitamin D. These supplements help to rebuild bone density. Follow these instructions at home: Eating and drinking Eat a diet that is high in calcium and vitamin D. Calcium is found in dairy products, beans, salmon, and leafy green vegetables like spinach and broccoli. Look for foods that have vitamin D and calcium added to them (fortified foods), such as orange juice, cereal, and bread.  Lifestyle Do 30 minutes or more of a weight-bearing exercise every day, such as walking, jogging, or playing a sport. These types of exercises strengthen the bones. Do not use any products that contain nicotine or tobacco, such as cigarettes, e-cigarettes, and chewing tobacco. If you need help quitting, ask your health care provider. Do not drink alcohol if: Your health care provider tells you not to drink. You are pregnant, may be pregnant, or are planning to become pregnant. If you drink alcohol: Limit how much you use to: 0-1 drink a day for women. 0-2  drinks a day for men. Be aware of how much alcohol is in your drink. In the [place] one drink equals one 12 oz bottle of beer (355 mL), one 5 oz glass of wine (148 mL), or one 1 oz glass of hard liquor (44 mL). General instructions Take over-the-counter and prescription medicines only as  told by your health care provider. These include vitamins and supplements. Take precautions at home to lower your risk of falling, such as: Keeping rooms well-lit and free of clutter, such as cords. Installing safety rails on stairs. Using rubber mats in the bathroom or other areas that are often wet or slippery. Keep all follow-up visits. This is important. Contact a health care provider if: You have not had a bone density screening for osteoporosis and you are: A woman who is age 70 or older. A man who is age 63 or older. You are a postmenopausal woman who has not had a bone density screening for osteoporosis. You are older than age 26 and you want to know if you should have bone density screening for osteoporosis. Summary Osteopenia is a loss of thickness (density) inside the bones. Another name for osteopenia is low bone mass. Osteopenia is not a disease, but it may increase your risk for a condition that causes the bones to become thin and break more easily (osteoporosis). You may be at risk for osteopenia if you are older than age 68 or if you are a woman who went through early menopause. Osteopenia does not cause any symptoms, but it can be diagnosed with a bone density screening test. Dietary and lifestyle changes are the first treatment for osteopenia. These may lower your risk for osteoporosis. This information is not intended to replace advice given to you by your health care provider. Make sure you discuss any questions you have with your health care provider. Document Revised: 02/13/2020 Document Reviewed: 02/13/2020 Elsevier Patient Education  2024 [employer] Alendronate Tablets What is this medication? ALENDRONATE (a LEN droe nate) prevents and treats osteoporosis. It may also be used to treat Paget disease of the bone. It works by Interior and spatial designer stronger and less likely to break (fracture). It belongs to a group of medications called bisphosphonates. This medicine may  be used for other purposes; ask your health care provider or pharmacist if you have questions. COMMON BRAND NAME(S): Fosamax What should I tell my care team before I take this medication? They need to know if you have any of these conditions: Bleeding disorder Cancer Dental disease Difficulty swallowing Infection (fever, chills, cough, sore throat, pain or trouble passing urine) Kidney disease Low levels of calcium or other minerals in the blood Low red blood cell counts Receiving steroids like dexamethasone or prednisone Stomach or intestine problems Trouble sitting or standing for 30 minutes An unusual or allergic reaction to alendronate, other medications, foods, dyes or preservatives Pregnant or trying to get pregnant Breast-feeding How should I use this medication? Take this medication by mouth with a full glass of water. Take it as directed on the prescription label at the same time every day. Take the dose right after waking up. Do not eat or drink anything before taking it. Do not take it with any other drink except water. Do not chew or crush the tablet. After taking it, do not eat breakfast, drink, or take any other medications or vitamins for at least 30 minutes. Sit or stand up for at least 30 minutes after you take it. Do not  lie down. Keep taking it unless your care team tells you to stop. A special MedGuide will be given to you by the pharmacist with each prescription and refill. Be sure to read this information carefully each time. Talk to your care team about the use of this medication in children. Special care may be needed. Overdosage: If you think you have taken too much of this medicine contact a poison control center or emergency room at once. NOTE: This medicine is only for you. Do not share this medicine with others. What if I miss a dose? If you take your medication once a day, skip it. Take your next dose at the scheduled time the next morning. Do not take two doses  on the same day. If you take your medication once a week, take the missed dose on the morning after you remember. Do not take two doses on the same day. What may interact with this medication? Aluminum hydroxide Antacids Aspirin Calcium supplements Medications for inflammation like ibuprofen, naproxen, and others Iron supplements Magnesium supplements Vitamins with minerals This list may not describe all possible interactions. Give your health care provider a list of all the medicines, herbs, non-prescription drugs, or dietary supplements you use. Also tell them if you smoke, drink alcohol, or use illegal drugs. Some items may interact with your medicine. What should I watch for while using this medication? Visit your care team for regular checks on your progress. It may be some time before you see the benefit from this medication. Some people who take this medication have severe bone, joint, or muscle pain. This medication may also increase your risk for jaw problems or a broken thigh bone. Tell your care team right away if you have severe pain in your jaw, bones, joints, or muscles. Tell you care team if you have any pain that does not go away or that gets worse. Tell your dentist and dental surgeon that you are taking this medication. You should not have major dental surgery while on this medication. See your dentist to have a dental exam and fix any dental problems before starting this medication. Take good care of your teeth while on this medication. Make sure you see your dentist for regular follow-up appointments. You should make sure you get enough calcium and vitamin D while you are taking this medication. Discuss the foods you eat and the vitamins you take with your care team. You may need blood work done while you are taking this medication. What side effects may I notice from receiving this medication? Side effects that you should report to your care team as soon as possible: Allergic  reactions--skin rash, itching, hives, swelling of the face, lips, tongue, or throat Low calcium level--muscle pain or cramps, confusion, tingling, or numbness in the hands or feet Osteonecrosis of the jaw--pain, swelling, or redness in the mouth, numbness of the jaw, poor healing after dental work, unusual discharge from the mouth, visible bones in the mouth Pain or trouble swallowing Severe bone, joint, or muscle pain Stomach bleeding--bloody or black, tar-like stools, vomiting blood or brown material that looks like coffee grounds Side effects that usually do not require medical attention (report to your care team if they continue or are bothersome): Constipation Diarrhea Nausea Stomach pain This list may not describe all possible side effects. Call your doctor for medical advice about side effects. You may report side effects to FDA at 1-800-FDA-1088. Where should I keep my medication? Keep out of the reach of  children and pets. Store at room temperature between 15 and 30 degrees C (59 and 86 degrees F). Throw away any unused medication after the expiration date. NOTE: This sheet is a summary. It may not cover all possible information. If you have questions about this medicine, talk to your doctor, pharmacist, or health care provider.  2024 Elsevier/Gold Standard (2020-09-10 00:00:00)

## 2023-03-22 LAB — VITAMIN D 25 HYDROXY (VIT D DEFICIENCY, FRACTURES): Vit D, 25-Hydroxy: 41 ng/mL (ref 30–100)

## 2023-03-22 LAB — PARATHYROID HORMONE, INTACT (NO CA): PTH: 49 pg/mL (ref 16–77)

## 2023-03-23 LAB — PROTEIN ELECTROPHORESIS, SERUM, WITH REFLEX
Albumin ELP: 3.8 g/dL (ref 3.8–4.8)
Alpha 1: 0.4 g/dL — ABNORMAL HIGH (ref 0.2–0.3)
Alpha 2: 0.8 g/dL (ref 0.5–0.9)
Beta 2: 0.5 g/dL (ref 0.2–0.5)
Beta Globulin: 0.5 g/dL (ref 0.4–0.6)
Gamma Globulin: 1.2 g/dL (ref 0.8–1.7)
Total Protein: 7.2 g/dL (ref 6.1–8.1)

## 2023-03-23 LAB — PHOSPHORUS: Phosphorus: 4 mg/dL (ref 2.1–4.3)

## 2023-03-23 LAB — TSH: TSH: 2.24 mIU/L (ref 0.40–4.50)

## 2023-03-24 NOTE — Progress Notes (Signed)
All the labs are within normal limits and vitamin D is in the desirable range.

## 2023-03-28 NOTE — Progress Notes (Signed)
Office Visit Note  Patient: Kerry [name]             Date of Birth: Sep 27, 1940           MRN: [id number]             PCP: Mliss Sax, MD Referring: Mliss Sax,* Visit Date: 04/11/2023 Occupation: @GUAROCC @  Subjective:  Treatment of osteopenia  History of Present Illness: [name] is a 82 y.o. female with history of osteopenia, osteoarthritis and degenerative disc disease.  She returns today to discuss treatment of osteopenia.  She continues to take calcium and vitamin D on a regular basis.  She is active at her work as she works part-time at [employer].  She is currently not walking much because of the warmer temperatures outside.  She had height loss of about 3 inches but had no vertebral fractures or stress fractures.  She had trauma induced fracture in the past.  She is uncertain if she has taken Fosamax in the past.    Activities of Daily Living:  Patient reports morning stiffness for 0 minutes.   Patient Denies nocturnal pain.  Difficulty dressing/grooming: Denies Difficulty climbing stairs: Reports Difficulty getting out of chair: Reports Difficulty using hands for taps, buttons, cutlery, and/or writing: Denies  Review of Systems  Constitutional:  Negative for fatigue.  HENT:  Negative for mouth sores and mouth dryness.   Eyes:  Negative for dryness.  Respiratory:  Positive for shortness of breath.        Asthma  Cardiovascular:  Negative for chest pain and palpitations.  Gastrointestinal:  Negative for blood in stool, constipation and diarrhea.  Endocrine: Negative for increased urination.  Genitourinary:  Negative for involuntary urination.  Musculoskeletal:  Positive for joint swelling, myalgias and myalgias. Negative for joint pain, gait problem, joint pain, muscle weakness, morning stiffness and muscle tenderness.  Skin:  Positive for hair loss and sensitivity to sunlight. Negative for color change and rash.   Allergic/Immunologic: Negative for susceptible to infections.  Neurological:  Positive for numbness. Negative for dizziness and headaches.  Hematological:  Negative for swollen glands.  Psychiatric/Behavioral:  Negative for depressed mood and sleep disturbance. The patient is not nervous/anxious.     PMFS History:  Patient Active Problem List   Diagnosis Date Noted   Tricuspid regurgitation    Mitral regurgitation    Pulmonary HTN (HCC)    Prediabetes 03/15/2023   History of dehydration 03/15/2023   Osteopenia 09/14/2022   Low back pain with sciatica 08/19/2022   Gait instability 08/19/2022   Hospital discharge follow-up 09/28/2021   Traumatic ecchymosis of head 09/28/2021   Chronic post-traumatic headache, not intractable 09/28/2021   Subdural hematoma (HCC) 09/24/2021   Encephalopathy acute 09/24/2021   Normocytic anemia 09/14/2021   Seasonal allergic rhinitis due to pollen 03/11/2021   DCM (dilated cardiomyopathy) (HCC)    Hyponatremia 06/21/2019   TIA (transient ischemic attack) 06/20/2019   Aortic insufficiency 08/17/2018   Hyperlipidemia    Right thyroid nodule 02/07/2013   Right carotid bruit 07/16/2012   Ocular migraine 11/04/2011   Hypercholesterolemia 06/01/2011   Essential hypertension    History of syncope    Asthma     Past Medical History:  Diagnosis Date   Allergy    Anemia    in her 20's   Aortic insufficiency    i   Arthritis    not dx'd- just per pt    Asthma    AS  A CHILD   Cancer (HCC)    endometrial cancer with hysterectomy in 1987   Cancer (HCC)    melanoma on face    Cataract    removed both eyes    DCM (dilated cardiomyopathy) (HCC)    EF 50-55%   GERD (gastroesophageal reflux disease)    occ has with diet    History of syncope    HTN (hypertension)    Hyperlipidemia    Mitral regurgitation    mild by echo 03/2023   OSA on CPAP    Pulmonary HTN (HCC)    PASP on echo 03/2023   TIA (transient ischemic attack)    Tricuspid  regurgitation    moderate by echo 03/2023    Family History  Problem Relation Age of Onset   Heart disease Mother    Kidney failure Mother    Diabetes Mother    Hyperlipidemia Mother    Hypertension Mother    Uterine cancer Mother    Dementia Father    Diabetes Sister    Multiple sclerosis Sister    Stomach cancer Sister    Dementia Sister    Throat cancer Brother        smoker   Diabetes Paternal Grandmother    Mitral valve prolapse Daughter    Colon cancer Neg Hx    Colon polyps Neg Hx    Esophageal cancer Neg Hx    Rectal cancer Neg Hx    Past Surgical History:  Procedure Laterality Date   ABDOMINAL HYSTERECTOMY     ANKLE FRACTURE SURGERY Left    APPENDECTOMY     CATARACT EXTRACTION, BILATERAL     CHOLECYSTECTOMY     COLONOSCOPY  08/01/2007   Dr Marina Goodell    DENTAL SURGERY     MELANOMA EXCISION     face   ovary removal     right leg  fracture surgery      SKIN BIOPSY     pre-cancerous on face   Social History   Social History Narrative   Lives at home alone   1 cup of coffee per day    Works at [employer] History  Administered Date(s) Administered   Fluad Quad(high Dose 65+) 05/31/2022   Influenza Split 06/19/2014   Influenza, High Dose Seasonal PF 07/16/2018   Influenza-Unspecified 05/31/2015, 06/13/2016, 06/23/2020, 07/04/2021   Moderna Sars-Covid-2 Vaccination 08/01/2020   PFIZER(Purple Top)SARS-COV-2 Vaccination 11/06/2019, 11/27/2019   Pneumococcal Conjugate-13 04/14/2017   Pneumococcal Polysaccharide-23 06/28/2009   Tdap 01/06/2023     Objective: Vital Signs: BP 120/69 (BP Location: Left Arm, Patient Position: Sitting, Cuff Size: Large)   Pulse 63   Resp 15   Ht 5\' 2"  (1.575 m)   Wt 191 lb 12.8 oz (87 kg)   BMI 35.08 kg/m    Physical Exam Vitals and nursing note reviewed.  Constitutional:      Appearance: She is well-developed.  HENT:     Head: Normocephalic and atraumatic.  Eyes:     Conjunctiva/sclera:  Conjunctivae normal.  Cardiovascular:     Rate and Rhythm: Normal rate and regular rhythm.     Heart sounds: Normal heart sounds.  Pulmonary:     Effort: Pulmonary effort is normal.     Breath sounds: Normal breath sounds.  Abdominal:     General: Bowel sounds are normal.     Palpations: Abdomen is soft.  Musculoskeletal:     Cervical back: Normal range of motion.  Lymphadenopathy:  Cervical: No cervical adenopathy.  Skin:    General: Skin is warm and dry.     Capillary Refill: Capillary refill takes less than 2 seconds.  Neurological:     Mental Status: She is alert and oriented to person, place, and time.  Psychiatric:        Behavior: Behavior normal.      Musculoskeletal Exam: Cervical spine was in good range of motion.  Thoracic kyphosis was noted without any tenderness.  She had limited range of motion of her lumbar spine without discomfort.  Shoulder joints, elbow joints, wrist joints were in good range of motion.  Bilateral CMC PIP and DIP thickening was noted.  Hip joints and knee joints were in good range of motion.  She had limited range of motion of her left ankle joint.  Surgical scar over bilateral ankles was noted.  There was no tenderness over ankles or MTPs.  CDAI Exam: CDAI Score: -- Patient Global: --; Provider Global: -- Swollen: --; Tender: -- Joint Exam 04/11/2023   No joint exam has been documented for this visit   There is currently no information documented on the homunculus. Go to the Rheumatology activity and complete the homunculus joint exam.  Investigation: No additional findings.  Imaging: VAS [place] ABI WITH/WO TBI  Result Date: 04/04/2023  LOWER EXTREMITY DOPPLER STUDY Patient Name:  Belinda Fisher  Date of Exam:   04/04/2023 Medical Rec #: [id number]        Accession #:    [id number] Date of Birth: September 18, 1940        Patient Gender: F Patient Age:   55 years Exam Location:  [facility] Procedure:      VAS [place] ABI WITH/WO TBI Referring Phys: TRACI  TURNER --------------------------------------------------------------------------------  Indications: Patient reports tingling sensations that start at her feet and              travels up to the knees. Mostly occurs when she is moving around.              She can also get pains in her calves at any time whether sitting or              walking. Intermittent edema at the bilateral ankles as well. High Risk Factors: Hypertension, hyperlipidemia, no history of smoking.  Performing Technologist: Olegario Shearer RVT  Examination Guidelines: A complete evaluation includes at minimum, Doppler waveform signals and systolic blood pressure reading at the level of bilateral brachial, anterior tibial, and posterior tibial arteries, when vessel segments are accessible. Bilateral testing is considered an integral part of a complete examination. Photoelectric Plethysmograph (PPG) waveforms and toe systolic pressure readings are included as required and additional duplex testing as needed. Limited examinations for reoccurring indications may be performed as noted.  ABI Findings: +---------+------------------+-----+---------+--------+ Right    Rt Pressure (mmHg)IndexWaveform Comment  +---------+------------------+-----+---------+--------+ Brachial 137                                      +---------+------------------+-----+---------+--------+ PTA      164               1.10 triphasic         +---------+------------------+-----+---------+--------+ DP       168               1.13 triphasic         +---------+------------------+-----+---------+--------+ Lynelle Smoke  0.93 Normal            +---------+------------------+-----+---------+--------+ +---------+------------------+-----+---------+-------+ Left     Lt Pressure (mmHg)IndexWaveform Comment +---------+------------------+-----+---------+-------+ Brachial 149                                      +---------+------------------+-----+---------+-------+ PTA      187               1.26 triphasic        +---------+------------------+-----+---------+-------+ DP       176               1.18 triphasic        +---------+------------------+-----+---------+-------+ Great Toe143               0.96 Normal           +---------+------------------+-----+---------+-------+ +-------+-----------+-----------+------------+------------+ ABI/TBIToday's ABIToday's TBIPrevious ABIPrevious TBI +-------+-----------+-----------+------------+------------+ Right  1.13       0.93                                +-------+-----------+-----------+------------+------------+ Left   1.26       0.96                                +-------+-----------+-----------+------------+------------+   Summary: Right: Resting right ankle-brachial index is within normal range. The right toe-brachial index is normal. Left: Resting left ankle-brachial index is within normal range. The left toe-brachial index is normal. *See table(s) above for measurements and observations.  Electronically signed by Lance Muss MD on 04/04/2023 at 4:47:46 PM.    Final    ECHOCARDIOGRAM COMPLETE  Result Date: 03/31/2023    ECHOCARDIOGRAM REPORT   Patient Name:   OMAYA NIELAND Date of Exam: 03/31/2023 Medical Rec #:  [id number]       Height:       62.0 in Accession #:    [id number]      Weight:       193.8 lb Date of Birth:  [birth year]       BSA:          1.886 m Patient Age:    82 years        BP:           136/60 mmHg Patient Gender: F               HR:           69 bpm. Exam Location:  [address] Procedure: 2D Echo, Cardiac Doppler and Color Doppler Indications:    I35.1 AI  History:        Patient has prior history of Echocardiogram examinations, most                 recent 10/20/2020. Dilated cardiomyopathy, CAD, Obesity, AI,                 Arrythmias:Paroxysmal atrial tachycardia,                 Signs/Symptoms:Syncope; Risk  Factors:Hypertension and                 Dyslipidemia.  Sonographer:    Samule Ohm RDCS Referring Phys: [id number] TRACI R TURNER IMPRESSIONS  1. Left ventricular ejection fraction, by estimation, is 50 to 55%. The left ventricle has low  normal function. The left ventricle has no regional wall motion abnormalities. The left ventricular internal cavity size was mildly dilated. Left ventricular diastolic parameters are consistent with Grade I diastolic dysfunction (impaired relaxation).  2. Right ventricular systolic function is normal. The right ventricular size is mildly enlarged. There is mildly elevated pulmonary artery systolic pressure. The estimated right ventricular systolic pressure is 40.7 mmHg.  3. Left atrial size was moderately dilated.  4. Right atrial size was moderately dilated.  5. The mitral valve is normal in structure. Mild mitral valve regurgitation. No evidence of mitral stenosis.  6. Tricuspid valve regurgitation is moderate.  7. The aortic valve is tricuspid. There is mild calcification of the aortic valve. Aortic valve regurgitation is mild to moderate. No aortic stenosis is present. Aortic regurgitation PHT measures 400 msec.  8. The inferior vena cava is normal in size with greater than 50% respiratory variability, suggesting right atrial pressure of 3 mmHg. FINDINGS  Left Ventricle: Left ventricular ejection fraction, by estimation, is 50 to 55%. The left ventricle has low normal function. The left ventricle has no regional wall motion abnormalities. The left ventricular internal cavity size was mildly dilated. There is no left ventricular hypertrophy. Left ventricular diastolic parameters are consistent with Grade I diastolic dysfunction (impaired relaxation). Right Ventricle: The right ventricular size is mildly enlarged. No increase in right ventricular wall thickness. Right ventricular systolic function is normal. There is mildly elevated pulmonary artery systolic pressure. The tricuspid  regurgitant velocity is 2.86 m/s, and with an assumed right atrial pressure of 8 mmHg, the estimated right ventricular systolic pressure is 40.7 mmHg. Left Atrium: Left atrial size was moderately dilated. Right Atrium: Right atrial size was moderately dilated. Pericardium: There is no evidence of pericardial effusion. Mitral Valve: The mitral valve is normal in structure. Mild mitral valve regurgitation. No evidence of mitral valve stenosis. Tricuspid Valve: The tricuspid valve is normal in structure. Tricuspid valve regurgitation is moderate . No evidence of tricuspid stenosis. Aortic Valve: The aortic valve is tricuspid. There is mild calcification of the aortic valve. Aortic valve regurgitation is mild to moderate. Aortic regurgitation PHT measures 400 msec. No aortic stenosis is present. Pulmonic Valve: The pulmonic valve was normal in structure. Pulmonic valve regurgitation is trivial. No evidence of pulmonic stenosis. Aorta: The aortic root is normal in size and structure. Venous: The inferior vena cava is normal in size with greater than 50% respiratory variability, suggesting right atrial pressure of 3 mmHg. IAS/Shunts: No atrial level shunt detected by color flow Doppler.  LEFT VENTRICLE PLAX 2D LVIDd:         5.30 cm   Diastology LVIDs:         3.80 cm   LV e' medial:    6.64 cm/s LV PW:         1.10 cm   LV E/e' medial:  11.9 LV IVS:        0.90 cm   LV e' lateral:   8.70 cm/s LVOT diam:     2.00 cm   LV E/e' lateral: 9.1 LV SV:         85 LV SV Index:   45 LVOT Area:     3.14 cm  RIGHT VENTRICLE             IVC RV S prime:     15.40 cm/s  IVC diam: 1.30 cm TAPSE (M-mode): 2.2 cm RVSP:           35.7 mmHg LEFT  ATRIUM             Index        RIGHT ATRIUM           Index LA diam:        4.40 cm 2.33 cm/m   RA Pressure: 3.00 mmHg LA Vol (A2C):   75.8 ml 40.19 ml/m  RA Area:     16.90 cm LA Vol (A4C):   64.6 ml 34.25 ml/m  RA Volume:   51.50 ml  27.30 ml/m LA Biplane Vol: 68.2 ml 36.16 ml/m  AORTIC  VALVE LVOT Vmax:   111.00 cm/s LVOT Vmean:  76.000 cm/s LVOT VTI:    0.269 m AI PHT:      400 msec  AORTA Ao Root diam: 3.70 cm Ao Asc diam:  3.10 cm MITRAL VALVE               TRICUSPID VALVE MV Area (PHT): 3.42 cm    TR Peak grad:   32.7 mmHg MV Decel Time: 222 msec    TR Vmax:        286.00 cm/s MV E velocity: 79.10 cm/s  Estimated RAP:  3.00 mmHg MV A velocity: 99.60 cm/s  RVSP:           35.7 mmHg MV E/A ratio:  0.79                            SHUNTS                            Systemic VTI:  0.27 m                            Systemic Diam: 2.00 cm Arvilla Meres MD Electronically signed by Arvilla Meres MD Signature Date/Time: 03/31/2023/12:42:48 PM    Final    XR Lumbar Spine 2-3 Views  Result Date: 03/21/2023 Multilevel spondylosis with anterior osteophytes and lateral osteophytes was noted.  L4-L5 spondylolisthesis was noted.  Most significant narrowing was noted between L4 and L5.  Facet joint arthropathy was noted. Impression: These findings are suggestive of multilevel spondylosis, L4-L5 spondylolisthesis and facet joint arthropathy.   Recent Labs: Lab Results  Component Value Date   WBC 8.3 03/15/2023   HGB 11.8 (L) 03/15/2023   PLT 242.0 03/15/2023   NA 129 (L) 03/15/2023   K 4.8 03/15/2023   CL 94 (L) 03/15/2023   CO2 29 03/15/2023   GLUCOSE 96 03/15/2023   BUN 19 03/15/2023   CREATININE 0.78 03/15/2023   BILITOT 0.8 03/15/2023   ALKPHOS 98 03/15/2023   AST 21 03/15/2023   ALT 16 03/15/2023   PROT 7.2 03/21/2023   ALBUMIN 4.0 03/15/2023   CALCIUM 9.9 03/15/2023   GFRAA 82 07/10/2020     Speciality Comments: No specialty comments available.  Procedures:  No procedures performed Allergies: Lovastatin and Sudafed [pseudoephedrine hcl]   Assessment / Plan:     Visit Diagnoses: Osteopenia of multiple sites - 08/23/22: DEXA scan left femoral neck -1.9, no comparison.  No history of a stress fracture.  Information on Fosamax was given at the last visit.  Patient  believes that she has taken Fosamax in the past.  She does not need any dental work.  She is interested in starting on Fosamax due to height loss.  Indications and side effects of Fosamax were  discussed at length.  A handout was given.  Will start her on Fosamax 70 mg p.o. weekly.  Creatinine was normal at 0.78 on March 15, 2023.  Patient does not have gastroesophageal reflux.March 21, 2023 SPEP normal, PTH normal, vitamin D 41, phosphorus 4.0, TSH normal.  Labs obtained at the last visit were reviewed.  Vitamin D deficiency - Vitamin D 46.8 13 March 2022.  Vitamin D on March 21, 2023 was 41.  Unstable gait - Due to history of left ankle fracture in 1983 and right tibia fracture 1999 after significant falls.  She had physical therapy in January for gait and balance.  She is still is having balance issues and gait instability.  I will refer her to physical therapy again.  Primary osteoarthritis of both hands-she has bilateral PIP and DIP thickening.  She denies any discomfort in her hands.  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  Which is hard on her hands but she likes being active.  History of fracture of left ankle - 1983 after jumping in the swimming pool requiring surgery.  Paresthesia of both feet - Intermittent paresthesias in feet.  DDD (degenerative disc disease), lumbar - Chronic pain in lumbar spine.  X-rays showed multilevel spondylosis, L4-5 spondylolisthesis and facet joint arthropathy.  X-rays were reviewed with the patient today.  A handout on back exercises was given.  I will also refer to physical therapy.  She denies any radiculopathy today.  Other medical problems are listed as follows:  Subdural hematoma (HCC) - 2023 after a fall.  TIA (transient ischemic attack) - 2023  Essential hypertension-blood pressure was normal at 120/69 today.  Hypercholesterolemia  Nonrheumatic aortic valve insufficiency  DCM (dilated cardiomyopathy) (HCC)  Mild intermittent asthma,  unspecified whether complicated  Seasonal allergic rhinitis due to pollen  History of melanoma  Ocular migraine  Right thyroid nodule  Orders: Orders Placed This Encounter  Procedures   Ambulatory referral to Physical Therapy   Meds ordered this encounter  Medications   alendronate (FOSAMAX) 70 MG tablet    Sig: Take 1 tablet (70 mg total) by mouth once a week. Take with a full glass of water on an empty stomach.    Dispense:  12 tablet    Refill:  1     Follow-Up Instructions: Return in about 3 months (around 07/12/2023) for Osteopenia, Osteoarthritis.   Pollyann Savoy, MD  Note - This record has been created using Animal nutritionist.  Chart creation errors have been sought, but may not always  have been located. Such creation errors do not reflect on  the standard of medical care.

## 2023-03-31 ENCOUNTER — Encounter: Payer: Self-pay | Admitting: Cardiology

## 2023-03-31 ENCOUNTER — Telehealth: Payer: Self-pay

## 2023-03-31 ENCOUNTER — Ambulatory Visit (HOSPITAL_COMMUNITY): Payer: Medicare Other | Attending: Internal Medicine

## 2023-03-31 DIAGNOSIS — I351 Nonrheumatic aortic (valve) insufficiency: Secondary | ICD-10-CM | POA: Diagnosis not present

## 2023-03-31 DIAGNOSIS — I071 Rheumatic tricuspid insufficiency: Secondary | ICD-10-CM | POA: Insufficient documentation

## 2023-03-31 DIAGNOSIS — I272 Pulmonary hypertension, unspecified: Secondary | ICD-10-CM | POA: Insufficient documentation

## 2023-03-31 DIAGNOSIS — I340 Nonrheumatic mitral (valve) insufficiency: Secondary | ICD-10-CM | POA: Insufficient documentation

## 2023-03-31 LAB — ECHOCARDIOGRAM COMPLETE
Area-P 1/2: 3.42 cm2
P 1/2 time: 400 msec
S' Lateral: 3.8 cm

## 2023-03-31 NOTE — Telephone Encounter (Signed)
Called to discuss recent results, no answer. LMTCB per DPR.

## 2023-03-31 NOTE — Telephone Encounter (Signed)
-----   Message from Armanda Magic sent at 03/31/2023  1:09 PM EDT ----- Please get PFTs with DLCO, VQ scan for pulmonary HTN as well as ANA, ESR, RF and CRP

## 2023-04-03 NOTE — Telephone Encounter (Signed)
Pt returning call, she states she will be home until 2:45

## 2023-04-04 ENCOUNTER — Ambulatory Visit (HOSPITAL_COMMUNITY)
Admission: RE | Admit: 2023-04-04 | Discharge: 2023-04-04 | Disposition: A | Discharge status: Home / Self Care | Payer: Medicare Other | Source: Ambulatory Visit | Attending: Cardiology | Admitting: Cardiology

## 2023-04-04 ENCOUNTER — Telehealth: Payer: Self-pay

## 2023-04-04 DIAGNOSIS — J4520 Mild intermittent asthma, uncomplicated: Secondary | ICD-10-CM

## 2023-04-04 DIAGNOSIS — I272 Pulmonary hypertension, unspecified: Secondary | ICD-10-CM

## 2023-04-04 DIAGNOSIS — I739 Peripheral vascular disease, unspecified: Secondary | ICD-10-CM | POA: Insufficient documentation

## 2023-04-04 DIAGNOSIS — I351 Nonrheumatic aortic (valve) insufficiency: Secondary | ICD-10-CM

## 2023-04-04 DIAGNOSIS — I420 Dilated cardiomyopathy: Secondary | ICD-10-CM

## 2023-04-04 LAB — VAS US ABI WITH/WO TBI
Left ABI: 1.26
Right ABI: 1.13

## 2023-04-04 NOTE — Telephone Encounter (Signed)
-----   Message from Armanda Magic sent at 03/31/2023  1:09 PM EDT ----- Please get PFTs with DLCO, VQ scan for pulmonary HTN as well as ANA, ESR, RF and CRP

## 2023-04-04 NOTE — Telephone Encounter (Signed)
Reviewed Echo results with patient. Patient verbalizes understanding of Low normal LVF, mildly enlarged LV with increased stiffness of heart muscle as well as moderately enlarged atria and mildly leaky MV and moderately lealy TV. Discussed AV with mildly calcification with  mild to moderately leaky AV and mild pulmonary HTN. Patient verbalizes understanding that Pulmonary hypertension is similar to 2022 presentation. Patient agrees to repeat echo in 1 yr and to t PFTs with DLCO, VQ scan for pulmonary HTN as well as ANA, ESR, RF and CRP. Ordered and scheduled.

## 2023-04-11 ENCOUNTER — Ambulatory Visit: Payer: Medicare Other

## 2023-04-11 ENCOUNTER — Ambulatory Visit: Payer: Medicare Other | Attending: Rheumatology | Admitting: Rheumatology

## 2023-04-11 ENCOUNTER — Encounter: Payer: Self-pay | Admitting: Rheumatology

## 2023-04-11 VITALS — BP 120/69 | HR 63 | Resp 15 | Ht 62.0 in | Wt 191.8 lb

## 2023-04-11 DIAGNOSIS — J301 Allergic rhinitis due to pollen: Secondary | ICD-10-CM

## 2023-04-11 DIAGNOSIS — M19042 Primary osteoarthritis, left hand: Secondary | ICD-10-CM

## 2023-04-11 DIAGNOSIS — G43109 Migraine with aura, not intractable, without status migrainosus: Secondary | ICD-10-CM

## 2023-04-11 DIAGNOSIS — R202 Paresthesia of skin: Secondary | ICD-10-CM

## 2023-04-11 DIAGNOSIS — G459 Transient cerebral ischemic attack, unspecified: Secondary | ICD-10-CM

## 2023-04-11 DIAGNOSIS — I351 Nonrheumatic aortic (valve) insufficiency: Secondary | ICD-10-CM

## 2023-04-11 DIAGNOSIS — E7800 Pure hypercholesterolemia, unspecified: Secondary | ICD-10-CM

## 2023-04-11 DIAGNOSIS — Z8781 Personal history of (healed) traumatic fracture: Secondary | ICD-10-CM

## 2023-04-11 DIAGNOSIS — M8589 Other specified disorders of bone density and structure, multiple sites: Secondary | ICD-10-CM | POA: Diagnosis not present

## 2023-04-11 DIAGNOSIS — Z85820 Personal history of malignant melanoma of skin: Secondary | ICD-10-CM

## 2023-04-11 DIAGNOSIS — I420 Dilated cardiomyopathy: Secondary | ICD-10-CM | POA: Diagnosis not present

## 2023-04-11 DIAGNOSIS — I10 Essential (primary) hypertension: Secondary | ICD-10-CM

## 2023-04-11 DIAGNOSIS — R2681 Unsteadiness on feet: Secondary | ICD-10-CM | POA: Diagnosis not present

## 2023-04-11 DIAGNOSIS — S065XAA Traumatic subdural hemorrhage with loss of consciousness status unknown, initial encounter: Secondary | ICD-10-CM

## 2023-04-11 DIAGNOSIS — J4520 Mild intermittent asthma, uncomplicated: Secondary | ICD-10-CM

## 2023-04-11 DIAGNOSIS — M19041 Primary osteoarthritis, right hand: Secondary | ICD-10-CM

## 2023-04-11 DIAGNOSIS — E559 Vitamin D deficiency, unspecified: Secondary | ICD-10-CM | POA: Diagnosis not present

## 2023-04-11 DIAGNOSIS — M5136 Other intervertebral disc degeneration, lumbar region: Secondary | ICD-10-CM

## 2023-04-11 DIAGNOSIS — E041 Nontoxic single thyroid nodule: Secondary | ICD-10-CM

## 2023-04-11 MED ORDER — ALENDRONATE SODIUM 70 MG PO TABS
70.0000 mg | ORAL_TABLET | ORAL | 1 refills | Status: DC
Start: 2023-04-11 — End: 2023-09-25

## 2023-04-11 NOTE — Progress Notes (Signed)
Pharmacy Note  Subjective: Patient presents today to the [facility] for follow up office visit.  Patient seen by pharmacist for counseling on bisphosphonate therapy for  osteopenia .  Objective:  Lab Results  Component Value Date   VD25OH 41 03/21/2023   CMP     Component Value Date/Time   NA 129 (L) 03/15/2023 0843   NA 139 07/10/2020 1209   K 4.8 03/15/2023 0843   CL 94 (L) 03/15/2023 0843   CO2 29 03/15/2023 0843   GLUCOSE 96 03/15/2023 0843   BUN 19 03/15/2023 0843   BUN 25 07/10/2020 1209   CREATININE 0.78 03/15/2023 0843   CREATININE 0.61 08/18/2016 0849   CALCIUM 9.9 03/15/2023 0843   PROT 7.2 03/21/2023 0954   PROT 7.1 07/10/2020 1209   ALBUMIN 4.0 03/15/2023 0843   ALBUMIN 4.1 07/10/2020 1209   AST 21 03/15/2023 0843   ALT 16 03/15/2023 0843   ALKPHOS 98 03/15/2023 0843   BILITOT 0.8 03/15/2023 0843   BILITOT 0.4 07/10/2020 1209   GFRNONAA 57 (L) 03/08/2023 2222   GFRAA 82 07/10/2020 1209     Assessment/Plan: Counseled patient that alendronate is an oral bisphosphonate that reduces bone turnover by inhibiting osteoclasts that chew up bone.  Counseled patient on purpose, proper use, and adverse effects of alendronate.  Reviewed with patient that alendronate should be taken once weekly.  Alendronate must be taking first thing in the morning, with a full glass of water, and she must wait an hour prior to eating food.  Also advised patient that she should not lie down until after she has eaten.  Provided patient with medication education material and answered all questions.  Reviewed adverse events of alendronate including risk of nausea & diarrhea, headache, and muscle & bone pain.  Reviewed rare adverse effect of osteonecrosis of the jaw and advised patient to alert her dentist that she is on alendronate prior to any major dental work. Patient has dentures.  Patient agrees to trial of alendronate at this time.     Reviewed importance of taking calcium  and vitamin D with bisphosphonate therapy. Recommended daily amount of calcium is 1200mg  and vitamin D [phone] units.  Advised calcium is better obtained through diet vs supplement.  Counseled about risk of excess calcium supplementation such a kidney stones and increased risk of heart disease. She eats enough yogurt and cheese. Rx for alendronate 70mg  po once weekly sent to pharmacy today.  Chesley Mires, PharmD, MPH, BCPS, CPP Clinical Pharmacist (Rheumatology and Pulmonology)

## 2023-04-11 NOTE — Patient Instructions (Signed)
Alendronate Tablets What is this medication? ALENDRONATE (a LEN droe nate) prevents and treats osteoporosis. It may also be used to treat Paget disease of the bone. It works by Interior and spatial designer stronger and less likely to break (fracture). It belongs to a group of medications called bisphosphonates. This medicine may be used for other purposes; ask your health care provider or pharmacist if you have questions. COMMON BRAND NAME(S): Fosamax What should I tell my care team before I take this medication? They need to know if you have any of these conditions: Bleeding disorder Cancer Dental disease Difficulty swallowing Infection (fever, chills, cough, sore throat, pain or trouble passing urine) Kidney disease Low levels of calcium or other minerals in the blood Low red blood cell counts Receiving steroids like dexamethasone or prednisone Stomach or intestine problems Trouble sitting or standing for 30 minutes An unusual or allergic reaction to alendronate, other medications, foods, dyes or preservatives Pregnant or trying to get pregnant Breast-feeding How should I use this medication? Take this medication by mouth with a full glass of water. Take it as directed on the prescription label at the same time every day. Take the dose right after waking up. Do not eat or drink anything before taking it. Do not take it with any other drink except water. Do not chew or crush the tablet. After taking it, do not eat breakfast, drink, or take any other medications or vitamins for at least 30 minutes. Sit or stand up for at least 30 minutes after you take it. Do not lie down. Keep taking it unless your care team tells you to stop. A special MedGuide will be given to you by the pharmacist with each prescription and refill. Be sure to read this information carefully each time. Talk to your care team about the use of this medication in children. Special care may be needed. Overdosage: If you think you have  taken too much of this medicine contact a poison control center or emergency room at once. NOTE: This medicine is only for you. Do not share this medicine with others. What if I miss a dose? If you take your medication once a day, skip it. Take your next dose at the scheduled time the next morning. Do not take two doses on the same day. If you take your medication once a week, take the missed dose on the morning after you remember. Do not take two doses on the same day. What may interact with this medication? Aluminum hydroxide Antacids Aspirin Calcium supplements Medications for inflammation like ibuprofen, naproxen, and others Iron supplements Magnesium supplements Vitamins with minerals This list may not describe all possible interactions. Give your health care provider a list of all the medicines, herbs, non-prescription drugs, or dietary supplements you use. Also tell them if you smoke, drink alcohol, or use illegal drugs. Some items may interact with your medicine. What should I watch for while using this medication? Visit your care team for regular checks on your progress. It may be some time before you see the benefit from this medication. Some people who take this medication have severe bone, joint, or muscle pain. This medication may also increase your risk for jaw problems or a broken thigh bone. Tell your care team right away if you have severe pain in your jaw, bones, joints, or muscles. Tell you care team if you have any pain that does not go away or that gets worse. Tell your dentist and dental surgeon that you are  taking this medication. You should not have major dental surgery while on this medication. See your dentist to have a dental exam and fix any dental problems before starting this medication. Take good care of your teeth while on this medication. Make sure you see your dentist for regular follow-up appointments. You should make sure you get enough calcium and vitamin D  while you are taking this medication. Discuss the foods you eat and the vitamins you take with your care team. You may need blood work done while you are taking this medication. What side effects may I notice from receiving this medication? Side effects that you should report to your care team as soon as possible: Allergic reactions--skin rash, itching, hives, swelling of the face, lips, tongue, or throat Low calcium level--muscle pain or cramps, confusion, tingling, or numbness in the hands or feet Osteonecrosis of the jaw--pain, swelling, or redness in the mouth, numbness of the jaw, poor healing after dental work, unusual discharge from the mouth, visible bones in the mouth Pain or trouble swallowing Severe bone, joint, or muscle pain Stomach bleeding--bloody or black, tar-like stools, vomiting blood or brown material that looks like coffee grounds Side effects that usually do not require medical attention (report to your care team if they continue or are bothersome): Constipation Diarrhea Nausea Stomach pain This list may not describe all possible side effects. Call your doctor for medical advice about side effects. You may report side effects to FDA at 1-800-FDA-1088. Where should I keep my medication? Keep out of the reach of children and pets. Store at room temperature between 15 and 30 degrees C (59 and 86 degrees F). Throw away any unused medication after the expiration date. NOTE: This sheet is a summary. It may not cover all possible information. If you have questions about this medicine, talk to your doctor, pharmacist, or health care provider.  2024 Elsevier/Gold Standard (2020-09-10 00:00:00)  Low Back Sprain or Strain Rehab Ask your health care provider which exercises are safe for you. Do exercises exactly as told by your health care provider and adjust them as directed. It is normal to feel mild stretching, pulling, tightness, or discomfort as you do these exercises. Stop  right away if you feel sudden pain or your pain gets worse. Do not begin these exercises until told by your health care provider. Stretching and range-of-motion exercises These exercises warm up your muscles and joints and improve the movement and flexibility of your back. These exercises also help to relieve pain, numbness, and tingling. Lumbar rotation  Lie on your back on a firm bed or the floor with your knees bent. Straighten your arms out to your sides so each arm forms a 90-degree angle (right angle) with a side of your body. Slowly move (rotate) both of your knees to one side of your body until you feel a stretch in your lower back (lumbar). Try not to let your shoulders lift off the floor. Hold this position for __________ seconds. Tense your abdominal muscles and slowly move your knees back to the starting position. Repeat this exercise on the other side of your body. Repeat __________ times. Complete this exercise __________ times a day. Single knee to chest  Lie on your back on a firm bed or the floor with both legs straight. Bend one of your knees. Use your hands to move your knee up toward your chest until you feel a gentle stretch in your lower back and buttock. Hold your leg in this  position by holding on to the front of your knee. Keep your other leg as straight as possible. Hold this position for __________ seconds. Slowly return to the starting position. Repeat with your other leg. Repeat __________ times. Complete this exercise __________ times a day. Prone extension on elbows  Lie on your abdomen on a firm bed or the floor (prone position). Prop yourself up on your elbows. Use your arms to help lift your chest up until you feel a gentle stretch in your abdomen and your lower back. This will place some of your body weight on your elbows. If this is uncomfortable, try stacking pillows under your chest. Your hips should stay down, against the surface that you are lying  on. Keep your hip and back muscles relaxed. Hold this position for __________ seconds. Slowly relax your upper body and return to the starting position. Repeat __________ times. Complete this exercise __________ times a day. Strengthening exercises These exercises build strength and endurance in your back. Endurance is the ability to use your muscles for a long time, even after they get tired. Pelvic tilt This exercise strengthens the muscles that lie deep in the abdomen. Lie on your back on a firm bed or the floor with your legs extended. Bend your knees so they are pointing toward the ceiling and your feet are flat on the floor. Tighten your lower abdominal muscles to press your lower back against the floor. This motion will tilt your pelvis so your tailbone points up toward the ceiling instead of pointing to your feet or the floor. To help with this exercise, you may place a small towel under your lower back and try to push your back into the towel. Hold this position for __________ seconds. Let your muscles relax completely before you repeat this exercise. Repeat __________ times. Complete this exercise __________ times a day. Alternating arm and leg raises  Get on your hands and knees on a firm surface. If you are on a hard floor, you may want to use padding, such as an exercise mat, to cushion your knees. Line up your arms and legs. Your hands should be directly below your shoulders, and your knees should be directly below your hips. Lift your left leg behind you. At the same time, raise your right arm and straighten it in front of you. Do not lift your leg higher than your hip. Do not lift your arm higher than your shoulder. Keep your abdominal and back muscles tight. Keep your hips facing the ground. Do not arch your back. Keep your balance carefully, and do not hold your breath. Hold this position for __________ seconds. Slowly return to the starting position. Repeat with your  right leg and your left arm. Repeat __________ times. Complete this exercise __________ times a day. Abdominal set with straight leg raise  Lie on your back on a firm bed or the floor. Bend one of your knees and keep your other leg straight. Tense your abdominal muscles and lift your straight leg up, 4-6 inches (10-15 cm) off the ground. Keep your abdominal muscles tight and hold this position for __________ seconds. Do not hold your breath. Do not arch your back. Keep it flat against the ground. Keep your abdominal muscles tense as you slowly lower your leg back to the starting position. Repeat with your other leg. Repeat __________ times. Complete this exercise __________ times a day. Single leg lower with bent knees Lie on your back on a firm bed or  the floor. Tense your abdominal muscles and lift your feet off the floor, one foot at a time, so your knees and hips are bent in 90-degree angles (right angles). Your knees should be over your hips and your lower legs should be parallel to the floor. Keeping your abdominal muscles tense and your knee bent, slowly lower one of your legs so your toe touches the ground. Lift your leg back up to return to the starting position. Do not hold your breath. Do not let your back arch. Keep your back flat against the ground. Repeat with your other leg. Repeat __________ times. Complete this exercise __________ times a day. Posture and body mechanics Good posture and healthy body mechanics can help to relieve stress in your body's tissues and joints. Body mechanics refers to the movements and positions of your body while you do your daily activities. Posture is part of body mechanics. Good posture means: Your spine is in its natural S-curve position (neutral). Your shoulders are pulled back slightly. Your head is not tipped forward (neutral). Follow these guidelines to improve your posture and body mechanics in your everyday  activities. Standing  When standing, keep your spine neutral and your feet about hip-width apart. Keep a slight bend in your knees. Your ears, shoulders, and hips should line up. When you do a task in which you stand in one place for a long time, place one foot up on a stable object that is 2-4 inches (5-10 cm) high, such as a footstool. This helps keep your spine neutral. Sitting  When sitting, keep your spine neutral and keep your feet flat on the floor. Use a footrest, if necessary, and keep your thighs parallel to the floor. Avoid rounding your shoulders, and avoid tilting your head forward. When working at a desk or a computer, keep your desk at a height where your hands are slightly lower than your elbows. Slide your chair under your desk so you are close enough to maintain good posture. When working at a computer, place your monitor at a height where you are looking straight ahead and you do not have to tilt your head forward or downward to look at the screen. Resting When lying down and resting, avoid positions that are most painful for you. If you have pain with activities such as sitting, bending, stooping, or squatting, lie in a position in which your body does not bend very much. For example, avoid curling up on your side with your arms and knees near your chest (fetal position). If you have pain with activities such as standing for a long time or reaching with your arms, lie with your spine in a neutral position and bend your knees slightly. Try the following positions: Lying on your side with a pillow between your knees. Lying on your back with a pillow under your knees. Lifting  When lifting objects, keep your feet at least shoulder-width apart and tighten your abdominal muscles. Bend your knees and hips and keep your spine neutral. It is important to lift using the strength of your legs, not your back. Do not lock your knees straight out. Always ask for help to lift heavy or  awkward objects. This information is not intended to replace advice given to you by your health care provider. Make sure you discuss any questions you have with your health care provider. Document Revised: 01/02/2023 Document Reviewed: 11/16/2020 Elsevier Patient Education  2024 [employer].

## 2023-04-19 ENCOUNTER — Ambulatory Visit: Payer: Medicare Other | Attending: Rheumatology

## 2023-04-19 ENCOUNTER — Other Ambulatory Visit: Payer: Self-pay

## 2023-04-19 DIAGNOSIS — M8589 Other specified disorders of bone density and structure, multiple sites: Secondary | ICD-10-CM | POA: Diagnosis not present

## 2023-04-19 DIAGNOSIS — M5136 Other intervertebral disc degeneration, lumbar region: Secondary | ICD-10-CM | POA: Insufficient documentation

## 2023-04-19 DIAGNOSIS — R2681 Unsteadiness on feet: Secondary | ICD-10-CM | POA: Diagnosis not present

## 2023-04-19 NOTE — Therapy (Signed)
OUTPATIENT PHYSICAL THERAPY EVALUATION   Patient Name: KEYLYN COLESTOCK MRN: [id number] DOB:Nov 25, 1940, 82 y.o., female Today's Date: 04/19/2023  END OF SESSION:  PT End of Session - 04/19/23 0840     Visit Number 1    Date for PT Re-Evaluation 06/14/23    Progress Note Due on Visit 10    PT Start Time [phone]    PT Stop Time 0930    PT Time Calculation (min) 48 min    Activity Tolerance Patient tolerated treatment well    Behavior During Therapy [facility] for tasks assessed/performed             Past Medical History:  Diagnosis Date   Allergy    Anemia    in her 20's   Aortic insufficiency    i   Arthritis    not dx'd- just per pt    Asthma    AS A CHILD   Cancer (HCC)    endometrial cancer with hysterectomy in 1987   Cancer (HCC)    melanoma on face    Cataract    removed both eyes    DCM (dilated cardiomyopathy) (HCC)    EF 50-55%   GERD (gastroesophageal reflux disease)    occ has with diet    History of syncope    HTN (hypertension)    Hyperlipidemia    Mitral regurgitation    mild by echo 03/2023   OSA on CPAP    Pulmonary HTN (HCC)    PASP on echo 03/2023   TIA (transient ischemic attack)    Tricuspid regurgitation    moderate by echo 03/2023   Past Surgical History:  Procedure Laterality Date   ABDOMINAL HYSTERECTOMY     ANKLE FRACTURE SURGERY Left    APPENDECTOMY     CATARACT EXTRACTION, BILATERAL     CHOLECYSTECTOMY     COLONOSCOPY  08/01/2007   Dr Marina Goodell    DENTAL SURGERY     MELANOMA EXCISION     face   ovary removal     right leg  fracture surgery      SKIN BIOPSY     pre-cancerous on face   Patient Active Problem List   Diagnosis Date Noted   Tricuspid regurgitation    Mitral regurgitation    Pulmonary HTN (HCC)    Prediabetes 03/15/2023   History of dehydration 03/15/2023   Osteopenia 09/14/2022   Low back pain with sciatica 08/19/2022   Gait instability 08/19/2022   Hospital discharge follow-up 09/28/2021   Traumatic  ecchymosis of head 09/28/2021   Chronic post-traumatic headache, not intractable 09/28/2021   Subdural hematoma (HCC) 09/24/2021   Encephalopathy acute 09/24/2021   Normocytic anemia 09/14/2021   Seasonal allergic rhinitis due to pollen 03/11/2021   DCM (dilated cardiomyopathy) (HCC)    Hyponatremia 06/21/2019   TIA (transient ischemic attack) 06/20/2019   Aortic insufficiency 08/17/2018   Hyperlipidemia    Right thyroid nodule 02/07/2013   Right carotid bruit 07/16/2012   Ocular migraine 11/04/2011   Hypercholesterolemia 06/01/2011   Essential hypertension    History of syncope    Asthma     PCP: Nadene Rubins, MD  REFERRING PROVIDER: Pollyann Savoy, MD  REFERRING DIAG: unsteady gait, osteopenia  Rationale for Evaluation and Treatment: Rehabilitation  THERAPY DIAG:  No diagnosis found.  ONSET DATE: January 2024   SUBJECTIVE:  SUBJECTIVE STATEMENT: Reports fell on wet steps in January and has noticed a decline in her balance since then.  Reports her  back hurts when she bends down too much and with prolonged walking but she can it down and the pain goes away quickly  PERTINENT HISTORY:  H/o osteoporosis with overall height loss of 3 ",  referred by rheumatologist due to unsteady gait  PAIN:  Are you having pain? Yes: NPRS scale: 0 to 5/10 Pain location: lower back Pain description: hurts with specific bending and walking Aggravating factors: above Relieving factors: sitting  PRECAUTIONS:  Fall and Other: osteoporosis  RED FLAGS: Compression fracture: Yes: possible old remote lumbar compression fx    WEIGHT BEARING RESTRICTIONS:  No  FALLS:  Has patient fallen in last 6 months? Yes. Number of falls 1  LIVING ENVIRONMENT: Lives with: lives alone Lives in:  House/apartment Stairs: No Has following equipment at home: Single point cane  OCCUPATION:  Work in retail 5 hrs in dressing room  PLOF:  Independent  PATIENT GOALS:  To avoid falling  NEXT MD VISIT: 2 months   OBJECTIVE:   DIAGNOSTIC FINDINGS:  Osteopenia of multiple sites - 08/23/22: DEXA scan left femoral neck -1.9, no comparison.  No history of a stress fracture.   X-rays showed multilevel spondylosis, L4-5 spondylolisthesis and facet joint arthropathy.  PATIENT SURVEYS:  Modified Oswestry 5/50   COGNITIVE STATUS: Within functional limits for tasks assessed   SENSATION: WFL, patient reports new numbness R post lower leg, foot, thigh , intermittent in intensity  POSTURE:  Wide base of support, B hip  HAND DOMINANCE:  Right  GAIT: Distance walked: 23' in clinic  Assistive device utilized: None Level of assistance: Complete Independence Comments: wide base, arms mid guard several times, shortened stride length B Range of motion : L ankle pflexion, d flexion limited to 15 degrees each B hip flexion to 92  MMT:  R hip abduction 3-/5,  B hamstrings 3+/5 L ankle 4-/5  Functional tests: Berg 40/56 30 sec sit to stand: 9 reps Gait with horizontal head turns, mild deviation from path Gait with vertical head movements with mod dysfunction, frequent stops   TREATMENT:                                                                                                                               DATE: 04/19/23: Instructed in green t band hip ER/abd in sitting, to engage weak R hip musculature    PATIENT EDUCATION:  Education details: POC, goals Person educated: Patient Education method: Explanation, Demonstration, and Tactile cues Education comprehension: verbalized understanding and tactile cues required  HOME EXERCISE PROGRAM: TBD   ASSESSMENT:  CLINICAL IMPRESSION: Patient is a 82 y.o. female who was evaluated today by skilled physical therapy due to  unsteady gait, with concurrent diagnosis of osteoporosis and chronic LBP.   She demonstrates weakness R hip abductors, B hamstrings.  Also decreased mobility L ankle  due to old injury.  She had increased  sway, was more challenged with her balance when adding head movements and closing her eyes so may benefit from vestibular habituation/ training.  Her Berg score was 4056 indicating mod fall risk.  She should benefit from skilled PT to address these deficits.  As she lives alone and has osteoporosis it is importance to address her fall risk.  She works 4 mornings a week so will schedule around her work schedule.   OBJECTIVE IMPAIRMENTS: decreased activity tolerance, decreased balance, difficulty walking, decreased strength, and increased edema.   ACTIVITY LIMITATIONS: carrying, lifting, bending, squatting, stairs, and locomotion level  PARTICIPATION LIMITATIONS: community activity, occupation, and yard work  PERSONAL FACTORS: Age, Behavior pattern, Past/current experiences, Time since onset of injury/illness/exacerbation, and 1-2 comorbidities: pulmonary hypertension, osteoporosis  are also affecting patient's functional outcome.   REHAB POTENTIAL: Good  CLINICAL DECISION MAKING: Stable/uncomplicated  EVALUATION COMPLEXITY: Low   GOALS: Goals reviewed with patient? Yes  SHORT TERM GOALS: Target date: 2 weeks, 05/03/23  I HEP Baseline: Goal status: INITIAL  LONG TERM GOALS: Target date: 06/14/23  Improve Berg from 40 to 45/56 Baseline:  Goal status: INITIAL  2.  Improve gait and balance with patient able to walk 20' with horizontal and vertical head movements without deviation from path Baseline:  Goal status: INITIAL  3.  Improve R hip abduction strength to 4+/5 for better stability for gait  Baseline: 3-/5 Goal status: INITIAL  4.  Improve B hamstrings strength to 4/5 for better control for reaching to floor Baseline: 3+/5 B Goal status: INITIAL   PLAN:  PT FREQUENCY:  2x/week  PT DURATION: 8 weeks  PLANNED INTERVENTIONS: Therapeutic exercises, Therapeutic activity, Neuromuscular re-education, Balance training, Gait training, Patient/Family education, Self Care, and Joint mobilization.  PLAN FOR NEXT SESSION: progress with specific strengthening for R hip abductors, B hamstrings, add VOR ex in standing    Emersynn Deatley L Evalynn Hankins, PTDPT [facility] 04/19/2023, 12:12 PM

## 2023-04-21 ENCOUNTER — Ambulatory Visit: Payer: Medicare Other | Admitting: Family Medicine

## 2023-04-28 ENCOUNTER — Ambulatory Visit (INDEPENDENT_AMBULATORY_CARE_PROVIDER_SITE_OTHER): Payer: Medicare Other | Admitting: Family Medicine

## 2023-04-28 ENCOUNTER — Encounter: Payer: Self-pay | Admitting: Family Medicine

## 2023-04-28 VITALS — BP 136/62 | HR 69 | Temp 97.9°F | Ht 63.0 in | Wt 188.8 lb

## 2023-04-28 DIAGNOSIS — E871 Hypo-osmolality and hyponatremia: Secondary | ICD-10-CM

## 2023-04-28 DIAGNOSIS — L03031 Cellulitis of right toe: Secondary | ICD-10-CM | POA: Diagnosis not present

## 2023-04-28 DIAGNOSIS — R7303 Prediabetes: Secondary | ICD-10-CM

## 2023-04-28 MED ORDER — CEPHALEXIN 500 MG PO CAPS: 500.0000 mg | ORAL_CAPSULE | Freq: Three times a day (TID) | ORAL | 0 refills | Status: AC

## 2023-04-28 NOTE — Progress Notes (Unsigned)
Established Patient Office Visit   Subjective:  Patient ID: Kerry [name], female    DOB: 1941/05/31  Age: 82 y.o. MRN: [id number]  Chief Complaint  Patient presents with   Follow-up    HPI Encounter Diagnoses  Name Primary?   Prediabetes Yes   Hyponatremia    Cellulitis of toe of right foot    For follow-up of above.  No prior history of diabetes.  No history of gestational diabetes.  Mother was diagnosed with diabetes at age 1.  Blood pressure is well-controlled with lisinopril HCTZ and carvedilol. {History (Optional):23778}  Review of Systems  Constitutional: Negative.   HENT: Negative.    Eyes:  Negative for blurred vision, discharge and redness.  Respiratory: Negative.    Cardiovascular: Negative.   Gastrointestinal:  Negative for abdominal pain.  Genitourinary: Negative.   Musculoskeletal: Negative.  Negative for myalgias.  Skin:  Negative for rash.  Neurological:  Negative for tingling, loss of consciousness and weakness.  Endo/Heme/Allergies:  Negative for polydipsia.     Current Outpatient Medications:    acetaminophen (TYLENOL) 500 MG tablet, Take 500 mg by mouth every 6 (six) hours as needed for moderate pain or headache. , Disp: , Rfl:    alendronate (FOSAMAX) 70 MG tablet, Take 1 tablet (70 mg total) by mouth once a week. Take with a full glass of water on an empty stomach., Disp: 12 tablet, Rfl: 1   Ascorbic Acid (VITAMIN C PO), Take 1 tablet by mouth daily., Disp: , Rfl:    aspirin 81 MG chewable tablet, Chew 81 mg by mouth every other day., Disp: , Rfl:    atorvastatin (LIPITOR) 20 MG tablet, TAKE 1 TABLET(20 MG) BY MOUTH DAILY, Disp: 90 tablet, Rfl: 0   carvedilol (COREG) 12.5 MG tablet, TAKE 1 TABLET(12.5 MG) BY MOUTH TWICE DAILY. KEEP APPOINTMENT FOR FURTHER REFILLS, Disp: 180 tablet, Rfl: 3   cephALEXin (KEFLEX) 500 MG capsule, Take 1 capsule (500 mg total) by mouth 3 (three) times daily for 7 days., Disp: 21 capsule, Rfl: 0   Cholecalciferol  (VITAMIN D3) 2000 units capsule, Take 2,000 Units by mouth daily., Disp: , Rfl:    Coenzyme Q10-Vitamin E (QUNOL ULTRA COQ10 PO), Take 1 capsule by mouth daily., Disp: , Rfl:    Cyanocobalamin 500 MCG CHEW, Take one daily, Disp: 90 tablet, Rfl: 1   fexofenadine (ALLEGRA) 180 MG tablet, Take 180 mg by mouth every morning., Disp: , Rfl:    hydrochlorothiazide (HYDRODIURIL) 25 MG tablet, Take 1 tablet (25 mg total) by mouth daily., Disp: 90 tablet, Rfl: 3   lisinopril (ZESTRIL) 20 MG tablet, Take 1 tablet (20 mg total) by mouth daily., Disp: 90 tablet, Rfl: 3   Misc Natural Products (NEURIVA PO), Take 1 tablet by mouth daily., Disp: , Rfl:    Polyethyl Glycol-Propyl Glycol (SYSTANE) 0.4-0.3 % SOLN, Place 1 drop into both eyes daily as needed (dry eyes)., Disp: , Rfl:    Multiple Vitamin (MULTIVITAMIN WITH MINERALS) TABS tablet, Take 1 tablet by mouth daily., Disp: , Rfl:    Objective:     BP 136/62   Pulse 69   Temp 97.9 F (36.6 C) (Temporal)   Ht 5\' 3"  (1.6 m)   Wt 188 lb 12.8 oz (85.6 kg)   SpO2 97%   BMI 33.44 kg/m  {Vitals History (Optional):23777}  Physical Exam Constitutional:      General: She is not in acute distress.    Appearance: Normal appearance. She is not ill-appearing, toxic-appearing  or diaphoretic.  HENT:     Head: Normocephalic and atraumatic.     Right Ear: External ear normal.     Left Ear: External ear normal.  Eyes:     General: No scleral icterus.       Right eye: No discharge.        Left eye: No discharge.     Extraocular Movements: Extraocular movements intact.     Conjunctiva/sclera: Conjunctivae normal.  Pulmonary:     Effort: Pulmonary effort is normal. No respiratory distress.  Skin:    General: Skin is warm and dry.  Neurological:     Mental Status: She is alert and oriented to person, place, and time.  Psychiatric:        Mood and Affect: Mood normal.        Behavior: Behavior normal.      No results found for any visits on  04/28/23.  {Labs (Optional):23779}  The ASCVD Risk score (Arnett DK, et al., 2019) failed to calculate for the following reasons:   The 2019 ASCVD risk score is only valid for ages 43 to 46   The patient has a prior MI or stroke diagnosis    Assessment & Plan:   Prediabetes  Hyponatremia -     Basic metabolic panel -     Sodium, urine, random -     Osmolality, urine  Cellulitis of toe of right foot -     Ambulatory referral to Podiatry  Other orders -     Cephalexin; Take 1 capsule (500 mg total) by mouth 3 (three) times daily for 7 days.  Dispense: 21 capsule; Refill: 0    Return As directed..  Information was given on prediabetes.  Labs were hyponatremia.  May have to discontinue HCTZ and follow-up.  Keflex for cellulitis of toes.  Suspect possible ingrown toenail.  Podiatry referral  Mliss Sax, MD

## 2023-05-01 ENCOUNTER — Other Ambulatory Visit: Payer: Self-pay

## 2023-05-01 ENCOUNTER — Other Ambulatory Visit: Payer: Medicare Other

## 2023-05-01 ENCOUNTER — Ambulatory Visit: Payer: Medicare Other

## 2023-05-01 DIAGNOSIS — R2681 Unsteadiness on feet: Secondary | ICD-10-CM

## 2023-05-01 DIAGNOSIS — E871 Hypo-osmolality and hyponatremia: Secondary | ICD-10-CM

## 2023-05-01 DIAGNOSIS — M8589 Other specified disorders of bone density and structure, multiple sites: Secondary | ICD-10-CM

## 2023-05-01 DIAGNOSIS — M5136 Other intervertebral disc degeneration, lumbar region: Secondary | ICD-10-CM

## 2023-05-01 NOTE — Therapy (Signed)
OUTPATIENT PHYSICAL THERAPY EVALUATION   Patient Name: Kerry [name] MRN: [id number] DOB:September 03, 1941, 82 y.o., female Today's Date: 05/01/2023  END OF SESSION:  PT End of Session - 05/01/23 0846     Visit Number 2    Date for PT Re-Evaluation 06/14/23    Progress Note Due on Visit 10    PT Start Time 0844    PT Stop Time 0930    PT Time Calculation (min) 46 min              Past Medical History:  Diagnosis Date   Allergy    Anemia    in her 20's   Aortic insufficiency    i   Arthritis    not dx'd- just per pt    Asthma    AS A CHILD   Cancer (HCC)    endometrial cancer with hysterectomy in 1987   Cancer (HCC)    melanoma on face    Cataract    removed both eyes    DCM (dilated cardiomyopathy) (HCC)    EF 50-55%   GERD (gastroesophageal reflux disease)    occ has with diet    History of syncope    HTN (hypertension)    Hyperlipidemia    Mitral regurgitation    mild by echo 03/2023   OSA on CPAP    Pulmonary HTN (HCC)    PASP on echo 03/2023   TIA (transient ischemic attack)    Tricuspid regurgitation    moderate by echo 03/2023   Past Surgical History:  Procedure Laterality Date   ABDOMINAL HYSTERECTOMY     ANKLE FRACTURE SURGERY Left    APPENDECTOMY     CATARACT EXTRACTION, BILATERAL     CHOLECYSTECTOMY     COLONOSCOPY  08/01/2007   Dr Marina Goodell    DENTAL SURGERY     MELANOMA EXCISION     face   ovary removal     right leg  fracture surgery      SKIN BIOPSY     pre-cancerous on face   Patient Active Problem List   Diagnosis Date Noted   Tricuspid regurgitation    Mitral regurgitation    Pulmonary HTN (HCC)    Prediabetes 03/15/2023   History of dehydration 03/15/2023   Osteopenia 09/14/2022   Low back pain with sciatica 08/19/2022   Gait instability 08/19/2022   Hospital discharge follow-up 09/28/2021   Traumatic ecchymosis of head 09/28/2021   Chronic post-traumatic headache, not intractable 09/28/2021   Subdural hematoma (HCC)  09/24/2021   Encephalopathy acute 09/24/2021   Normocytic anemia 09/14/2021   Seasonal allergic rhinitis due to pollen 03/11/2021   DCM (dilated cardiomyopathy) (HCC)    Hyponatremia 06/21/2019   TIA (transient ischemic attack) 06/20/2019   Aortic insufficiency 08/17/2018   Hyperlipidemia    Right thyroid nodule 02/07/2013   Right carotid bruit 07/16/2012   Ocular migraine 11/04/2011   Hypercholesterolemia 06/01/2011   Essential hypertension    History of syncope    Asthma     PCP: Nadene Rubins, MD  REFERRING PROVIDER: Pollyann Savoy, MD  REFERRING DIAG: unsteady gait, osteopenia  Rationale for Evaluation and Treatment: Rehabilitation  THERAPY DIAG:  Unstable gait  DDD (degenerative disc disease), lumbar  Osteopenia of multiple sites  ONSET DATE: January 2024   SUBJECTIVE:  SUBJECTIVE STATEMENT:05/01/23:  no new complaints Reports fell on wet steps in January and has noticed a decline in her balance since then.  Reports her  back hurts when she bends down too much and with prolonged walking but she can it down and the pain goes away quickly  PERTINENT HISTORY:  H/o osteoporosis with overall height loss of 3 ",  referred by rheumatologist due to unsteady gait  PAIN:  Are you having pain? Yes: NPRS scale: 0 to 5/10 Pain location: lower back Pain description: hurts with specific bending and walking Aggravating factors: above Relieving factors: sitting  PRECAUTIONS:  Fall and Other: osteoporosis  RED FLAGS: Compression fracture: Yes: possible old remote lumbar compression fx    WEIGHT BEARING RESTRICTIONS:  No  FALLS:  Has patient fallen in last 6 months? Yes. Number of falls 1  LIVING ENVIRONMENT: Lives with: lives alone Lives in: House/apartment Stairs: No Has  following equipment at home: Single point cane  OCCUPATION:  Work in retail 5 hrs in dressing room  PLOF:  Independent  PATIENT GOALS:  To avoid falling  NEXT MD VISIT: 2 months   OBJECTIVE:   DIAGNOSTIC FINDINGS:  Osteopenia of multiple sites - 08/23/22: DEXA scan left femoral neck -1.9, no comparison.  No history of a stress fracture.   X-rays showed multilevel spondylosis, L4-5 spondylolisthesis and facet joint arthropathy.  PATIENT SURVEYS:  Modified Oswestry 5/50   COGNITIVE STATUS: Within functional limits for tasks assessed   SENSATION: WFL, patient reports new numbness R post lower leg, foot, thigh , intermittent in intensity  POSTURE:  Wide base of support, B hip  HAND DOMINANCE:  Right  GAIT: Distance walked: 26' in clinic  Assistive device utilized: None Level of assistance: Complete Independence Comments: wide base, arms mid guard several times, shortened stride length B Range of motion : L ankle pflexion, d flexion limited to 15 degrees each B hip flexion to 92  MMT:  R hip abduction 3-/5,  B hamstrings 3+/5 L ankle 4-/5  Functional tests: Berg 40/56 30 sec sit to stand: 9 reps Gait with horizontal head turns, mild deviation from path Gait with vertical head movements with mod dysfunction, frequent stops   TREATMENT:                                                                                                                               DATE:  05/01/23: Nustep LE's only level 5, 6 min Seated hamstring curls red t band 3 x 15 Standing in corner, semi tandem, horizontal head turns, alternating lead foot, added this ex to home program Penguins in ll bars with red t band around thighs, to fatigue, mass practice with B hand support Seated long arc quads with 2# 10 reps x 3 Standing on airex in ll bars for heel toe rocks 10 x 3 Lateral travelling in ll bars over airex Standing alt toe taps, cone, mass practice in ll bars  04/19/23: Instructed in green t band hip ER/abd in sitting, to engage weak R hip musculature    PATIENT EDUCATION:  Education details: POC, goals Person educated: Patient Education method: Explanation, Demonstration, and Tactile cues Education comprehension: verbalized understanding and tactile cues required  HOME EXERCISE PROGRAM: Access Code: [email or web address] URL: [email or web address] Date: 05/01/2023 Prepared by: Linton Rump Mordecai Tindol  Exercises - Gaze Stability (VOR) x 2 Feet Together With Horizontal Head Turns  - 1 x daily - 7 x weekly - 3 sets - 10 reps TBD   ASSESSMENT:  CLINICAL IMPRESSION: Patient is a 82 y.o. female who was participated in skilled physical therapy session today due to unsteady gait, with concurrent diagnosis of osteoporosis and chronic LBP.   Today was her first full session following her initial evaluation.  Addressed strength LE's and stability, balance, VOR. She should benefit from skilled PT to address these deficits.  As she lives alone and has osteoporosis it is important to address her fall risk.  She works 4 mornings a week so will schedule around her work schedule.   OBJECTIVE IMPAIRMENTS: decreased activity tolerance, decreased balance, difficulty walking, decreased strength, and increased edema.   ACTIVITY LIMITATIONS: carrying, lifting, bending, squatting, stairs, and locomotion level  PARTICIPATION LIMITATIONS: community activity, occupation, and yard work  PERSONAL FACTORS: Age, Behavior pattern, Past/current experiences, Time since onset of injury/illness/exacerbation, and 1-2 comorbidities: pulmonary hypertension, osteoporosis  are also affecting patient's functional outcome.   REHAB POTENTIAL: Good  CLINICAL DECISION MAKING: Stable/uncomplicated  EVALUATION COMPLEXITY: Low   GOALS: Goals reviewed with patient? Yes  SHORT TERM GOALS: Target date: 2 weeks, 05/03/23  I HEP Baseline: Goal status: INITIAL  LONG TERM GOALS: Target  date: 06/14/23  Improve Berg from 40 to 45/56 Baseline:  Goal status: INITIAL  2.  Improve gait and balance with patient able to walk 20' with horizontal and vertical head movements without deviation from path Baseline:  Goal status: INITIAL  3.  Improve R hip abduction strength to 4+/5 for better stability for gait  Baseline: 3-/5 Goal status: INITIAL  4.  Improve B hamstrings strength to 4/5 for better control for reaching to floor Baseline: 3+/5 B Goal status: INITIAL   PLAN:  PT FREQUENCY: 2x/week  PT DURATION: 8 weeks  PLANNED INTERVENTIONS: Therapeutic exercises, Therapeutic activity, Neuromuscular re-education, Balance training, Gait training, Patient/Family education, Self Care, and Joint mobilization.  PLAN FOR NEXT SESSION: progress with specific strengthening for R hip abductors, B hamstrings, add VOR ex in standing    Genie Wenke L Pauleen Goleman, PTDPT [facility] 05/01/2023, 12:09 PM

## 2023-05-02 LAB — OSMOLALITY, URINE: Osmolality, Ur: 353 mOsm/kg (ref 50–1200)

## 2023-05-02 LAB — EXTRA URINE SPECIMEN

## 2023-05-02 LAB — BASIC METABOLIC PANEL
BUN/Creatinine Ratio: 33 (calc) — ABNORMAL HIGH (ref 6–22)
BUN: 28 mg/dL — ABNORMAL HIGH (ref 7–25)
CO2: 22 mmol/L (ref 20–32)
Calcium: 9.9 mg/dL (ref 8.6–10.4)
Chloride: 103 mmol/L (ref 98–110)
Creat: 0.84 mg/dL (ref 0.60–0.95)
Glucose, Bld: 91 mg/dL (ref 65–99)
Potassium: 5 mmol/L (ref 3.5–5.3)
Sodium: 138 mmol/L (ref 135–146)

## 2023-05-02 LAB — SODIUM, URINE, RANDOM: Sodium, Ur: 81 mmol/L (ref 28–272)

## 2023-05-03 ENCOUNTER — Ambulatory Visit: Payer: Medicare Other | Admitting: Physical Therapy

## 2023-05-03 ENCOUNTER — Encounter: Payer: Self-pay | Admitting: Physical Therapy

## 2023-05-03 DIAGNOSIS — M8589 Other specified disorders of bone density and structure, multiple sites: Secondary | ICD-10-CM | POA: Diagnosis not present

## 2023-05-03 DIAGNOSIS — M5136 Other intervertebral disc degeneration, lumbar region: Secondary | ICD-10-CM | POA: Diagnosis not present

## 2023-05-03 DIAGNOSIS — R2681 Unsteadiness on feet: Secondary | ICD-10-CM

## 2023-05-03 NOTE — Therapy (Signed)
OUTPATIENT PHYSICAL THERAPY EVALUATION   Patient Name: Kerry [name] MRN: [id number] DOB:1941/08/20, 82 y.o., female Today's Date: 05/03/2023  END OF SESSION:  PT End of Session - 05/03/23 1427     Visit Number 3    Date for PT Re-Evaluation 06/14/23    PT Start Time 1430    PT Stop Time 1515    PT Time Calculation (min) 45 min    Activity Tolerance Patient tolerated treatment well    Behavior During Therapy WFL for tasks assessed/performed              Past Medical History:  Diagnosis Date   Allergy    Anemia    in her 20's   Aortic insufficiency    i   Arthritis    not dx'd- just per pt    Asthma    AS A CHILD   Cancer (HCC)    endometrial cancer with hysterectomy in 1987   Cancer (HCC)    melanoma on face    Cataract    removed both eyes    DCM (dilated cardiomyopathy) (HCC)    EF 50-55%   GERD (gastroesophageal reflux disease)    occ has with diet    History of syncope    HTN (hypertension)    Hyperlipidemia    Mitral regurgitation    mild by echo 03/2023   OSA on CPAP    Pulmonary HTN (HCC)    PASP on echo 03/2023   TIA (transient ischemic attack)    Tricuspid regurgitation    moderate by echo 03/2023   Past Surgical History:  Procedure Laterality Date   ABDOMINAL HYSTERECTOMY     ANKLE FRACTURE SURGERY Left    APPENDECTOMY     CATARACT EXTRACTION, BILATERAL     CHOLECYSTECTOMY     COLONOSCOPY  08/01/2007   Dr Marina Goodell    DENTAL SURGERY     MELANOMA EXCISION     face   ovary removal     right leg  fracture surgery      SKIN BIOPSY     pre-cancerous on face   Patient Active Problem List   Diagnosis Date Noted   Tricuspid regurgitation    Mitral regurgitation    Pulmonary HTN (HCC)    Prediabetes 03/15/2023   History of dehydration 03/15/2023   Osteopenia 09/14/2022   Low back pain with sciatica 08/19/2022   Gait instability 08/19/2022   Hospital discharge follow-up 09/28/2021   Traumatic ecchymosis of head 09/28/2021    Chronic post-traumatic headache, not intractable 09/28/2021   Subdural hematoma (HCC) 09/24/2021   Encephalopathy acute 09/24/2021   Normocytic anemia 09/14/2021   Seasonal allergic rhinitis due to pollen 03/11/2021   DCM (dilated cardiomyopathy) (HCC)    Hyponatremia 06/21/2019   TIA (transient ischemic attack) 06/20/2019   Aortic insufficiency 08/17/2018   Hyperlipidemia    Right thyroid nodule 02/07/2013   Right carotid bruit 07/16/2012   Ocular migraine 11/04/2011   Hypercholesterolemia 06/01/2011   Essential hypertension    History of syncope    Asthma     PCP: Nadene Rubins, MD  REFERRING PROVIDER: Pollyann Savoy, MD  REFERRING DIAG: unsteady gait, osteopenia  Rationale for Evaluation and Treatment: Rehabilitation  THERAPY DIAG:  Unstable gait  DDD (degenerative disc disease), lumbar  Osteopenia of multiple sites  ONSET DATE: January 2024   SUBJECTIVE:  SUBJECTIVE STATEMENT: "Tired" no falls or stumbles   PERTINENT HISTORY:  H/o osteoporosis with overall height loss of 3 ",  referred by rheumatologist due to unsteady gait  PAIN:  Are you having pain? Yes: NPRS scale: 0 /10 Pain location: lower back Pain description: hurts with specific bending and walking Aggravating factors: above Relieving factors: sitting  PRECAUTIONS:  Fall and Other: osteoporosis  RED FLAGS: Compression fracture: Yes: possible old remote lumbar compression fx    WEIGHT BEARING RESTRICTIONS:  No  FALLS:  Has patient fallen in last 6 months? Yes. Number of falls 1  LIVING ENVIRONMENT: Lives with: lives alone Lives in: House/apartment Stairs: No Has following equipment at home: Single point cane  OCCUPATION:  Work in retail 5 hrs in dressing room  PLOF:  Independent  PATIENT  GOALS:  To avoid falling  NEXT MD VISIT: 2 months   OBJECTIVE:   DIAGNOSTIC FINDINGS:  Osteopenia of multiple sites - 08/23/22: DEXA scan left femoral neck -1.9, no comparison.  No history of a stress fracture.   X-rays showed multilevel spondylosis, L4-5 spondylolisthesis and facet joint arthropathy.  PATIENT SURVEYS:  Modified Oswestry 5/50   COGNITIVE STATUS: Within functional limits for tasks assessed   SENSATION: WFL, patient reports new numbness R post lower leg, foot, thigh , intermittent in intensity  POSTURE:  Wide base of support, B hip  HAND DOMINANCE:  Right  GAIT: Distance walked: 47' in clinic  Assistive device utilized: None Level of assistance: Complete Independence Comments: wide base, arms mid guard several times, shortened stride length B Range of motion : L ankle pflexion, d flexion limited to 15 degrees each B hip flexion to 92  MMT:  R hip abduction 3-/5,  B hamstrings 3+/5 L ankle 4-/5  Functional tests: Berg 40/56 30 sec sit to stand: 9 reps Gait with horizontal head turns, mild deviation from path Gait with vertical head movements with mod dysfunction, frequent stops   TREATMENT:          DATE:  05/03/23 NuStep L5 x 6 min  Resisted gait forward and backward x3  S2S x10, then x10 LE on airex On airex alt 6in taps 2x10  Hs curls 20lb 2x10  Leg Ext 5lb 2x10 Seated rows red 2x0   05/01/23: Nustep LE's only level 5, 6 min Seated hamstring curls red t band 3 x 15 Standing in corner, semi tandem, horizontal head turns, alternating lead foot, added this ex to home program Penguins in ll bars with red t band around thighs, to fatigue, mass practice with B hand support Seated long arc quads with 2# 10 reps x 3 Standing on airex in ll bars for heel toe rocks 10 x 3 Lateral travelling in ll bars over airex Standing alt toe taps, cone, mass practice in ll bars   04/19/23: Instructed in green t band hip ER/abd in sitting, to engage weak R hip  musculature    PATIENT EDUCATION:  Education details: POC, goals Person educated: Patient Education method: Explanation, Demonstration, and Tactile cues Education comprehension: verbalized understanding and tactile cues required  HOME EXERCISE PROGRAM: Access Code: [email or web address] URL: [email or web address] Date: 05/01/2023 Prepared by: Linton Rump Speaks  Exercises - Gaze Stability (VOR) x 2 Feet Together With Horizontal Head Turns  - 1 x daily - 7 x weekly - 3 sets - 10 reps TBD   ASSESSMENT:  CLINICAL IMPRESSION: Patient is a 82 y.o. female who was participated in skilled physical therapy session today due to unsteady gait, with  concurrent diagnosis of osteoporosis and chronic LBP.   Today was her second full session following her initial evaluation.  Addressed LE strength and balance. Increase fatigue reported with resisted gait. Cha needed at times with alt box taps form airex. Pt stated she was tire post session.  She should benefit from skilled PT to address these deficits.  As she lives alone and has osteoporosis it is important to address her fall risk.  She works 4 mornings a week so will schedule around her work schedule.   OBJECTIVE IMPAIRMENTS: decreased activity tolerance, decreased balance, difficulty walking, decreased strength, and increased edema.   ACTIVITY LIMITATIONS: carrying, lifting, bending, squatting, stairs, and locomotion level  PARTICIPATION LIMITATIONS: community activity, occupation, and yard work  PERSONAL FACTORS: Age, Behavior pattern, Past/current experiences, Time since onset of injury/illness/exacerbation, and 1-2 comorbidities: pulmonary hypertension, osteoporosis  are also affecting patient's functional outcome.   REHAB POTENTIAL: Good  CLINICAL DECISION MAKING: Stable/uncomplicated  EVALUATION COMPLEXITY: Low   GOALS: Goals reviewed with patient? Yes  SHORT TERM GOALS: Target date: 2 weeks, 05/03/23  I HEP Baseline: Goal status:  INITIAL  LONG TERM GOALS: Target date: 06/14/23  Improve Berg from 40 to 45/56 Baseline:  Goal status: INITIAL  2.  Improve gait and balance with patient able to walk 20' with horizontal and vertical head movements without deviation from path Baseline:  Goal status: INITIAL  3.  Improve R hip abduction strength to 4+/5 for better stability for gait  Baseline: 3-/5 Goal status: INITIAL  4.  Improve B hamstrings strength to 4/5 for better control for reaching to floor Baseline: 3+/5 B Goal status: INITIAL   PLAN:  PT FREQUENCY: 2x/week  PT DURATION: 8 weeks  PLANNED INTERVENTIONS: Therapeutic exercises, Therapeutic activity, Neuromuscular re-education, Balance training, Gait training, Patient/Family education, Self Care, and Joint mobilization.  PLAN FOR NEXT SESSION: progress with specific strengthening for R hip abductors, B hamstrings, add VOR ex in standing    Grayce Sessions, PTADPT [facility] 05/03/2023, 2:28 PM

## 2023-05-08 ENCOUNTER — Other Ambulatory Visit: Payer: Self-pay | Admitting: Cardiology

## 2023-05-10 ENCOUNTER — Other Ambulatory Visit: Payer: Self-pay

## 2023-05-10 ENCOUNTER — Ambulatory Visit: Payer: Medicare Other

## 2023-05-10 DIAGNOSIS — M8589 Other specified disorders of bone density and structure, multiple sites: Secondary | ICD-10-CM

## 2023-05-10 DIAGNOSIS — M5136 Other intervertebral disc degeneration, lumbar region: Secondary | ICD-10-CM

## 2023-05-10 DIAGNOSIS — R2681 Unsteadiness on feet: Secondary | ICD-10-CM

## 2023-05-10 NOTE — Therapy (Signed)
OUTPATIENT PHYSICAL THERAPY EVALUATION   Patient Name: Kerry [name] MRN: [id number] DOB:01/29/1941, 82 y.o., female Today's Date: 05/10/2023  END OF SESSION:  PT End of Session - 05/10/23 1121     Visit Number 4    Date for PT Re-Evaluation 06/14/23    Progress Note Due on Visit 10    PT Start Time 1020    PT Stop Time 1102    PT Time Calculation (min) 42 min    Activity Tolerance Patient tolerated treatment well    Behavior During Therapy WFL for tasks assessed/performed               Past Medical History:  Diagnosis Date   Allergy    Anemia    in her 20's   Aortic insufficiency    i   Arthritis    not dx'd- just per pt    Asthma    AS A CHILD   Cancer (HCC)    endometrial cancer with hysterectomy in 1987   Cancer (HCC)    melanoma on face    Cataract    removed both eyes    DCM (dilated cardiomyopathy) (HCC)    EF 50-55%   GERD (gastroesophageal reflux disease)    occ has with diet    History of syncope    HTN (hypertension)    Hyperlipidemia    Mitral regurgitation    mild by echo 03/2023   OSA on CPAP    Pulmonary HTN (HCC)    PASP on echo 03/2023   TIA (transient ischemic attack)    Tricuspid regurgitation    moderate by echo 03/2023   Past Surgical History:  Procedure Laterality Date   ABDOMINAL HYSTERECTOMY     ANKLE FRACTURE SURGERY Left    APPENDECTOMY     CATARACT EXTRACTION, BILATERAL     CHOLECYSTECTOMY     COLONOSCOPY  08/01/2007   Dr Marina Goodell    DENTAL SURGERY     MELANOMA EXCISION     face   ovary removal     right leg  fracture surgery      SKIN BIOPSY     pre-cancerous on face   Patient Active Problem List   Diagnosis Date Noted   Tricuspid regurgitation    Mitral regurgitation    Pulmonary HTN (HCC)    Prediabetes 03/15/2023   History of dehydration 03/15/2023   Osteopenia 09/14/2022   Low back pain with sciatica 08/19/2022   Gait instability 08/19/2022   Hospital discharge follow-up 09/28/2021   Traumatic  ecchymosis of head 09/28/2021   Chronic post-traumatic headache, not intractable 09/28/2021   Subdural hematoma (HCC) 09/24/2021   Encephalopathy acute 09/24/2021   Normocytic anemia 09/14/2021   Seasonal allergic rhinitis due to pollen 03/11/2021   DCM (dilated cardiomyopathy) (HCC)    Hyponatremia 06/21/2019   TIA (transient ischemic attack) 06/20/2019   Aortic insufficiency 08/17/2018   Hyperlipidemia    Right thyroid nodule 02/07/2013   Right carotid bruit 07/16/2012   Ocular migraine 11/04/2011   Hypercholesterolemia 06/01/2011   Essential hypertension    History of syncope    Asthma     PCP: Nadene Rubins, MD  REFERRING PROVIDER: Pollyann Savoy, MD  REFERRING DIAG: unsteady gait, osteopenia  Rationale for Evaluation and Treatment: Rehabilitation  THERAPY DIAG:  Unstable gait  DDD (degenerative disc disease), lumbar  Osteopenia of multiple sites  ONSET DATE: January 2024   SUBJECTIVE:  SUBJECTIVE STATEMENT: Able to manage curb ok this week, have been practicing the head turns at work    PERTINENT HISTORY:  H/o osteoporosis with overall height loss of 3 ",  referred by rheumatologist due to unsteady gait  PAIN:  Are you having pain? Yes: NPRS scale: 0 /10 Pain location: lower back Pain description: hurts with specific bending and walking Aggravating factors: above Relieving factors: sitting  PRECAUTIONS:  Fall and Other: osteoporosis  RED FLAGS: Compression fracture: Yes: possible old remote lumbar compression fx    WEIGHT BEARING RESTRICTIONS:  No  FALLS:  Has patient fallen in last 6 months? Yes. Number of falls 1  LIVING ENVIRONMENT: Lives with: lives alone Lives in: House/apartment Stairs: No Has following equipment at home: Single point  cane  OCCUPATION:  Work in retail 5 hrs in dressing room  PLOF:  Independent  PATIENT GOALS:  To avoid falling  NEXT MD VISIT: 2 months   OBJECTIVE:   DIAGNOSTIC FINDINGS:  Osteopenia of multiple sites - 08/23/22: DEXA scan left femoral neck -1.9, no comparison.  No history of a stress fracture.   X-rays showed multilevel spondylosis, L4-5 spondylolisthesis and facet joint arthropathy.  PATIENT SURVEYS:  Modified Oswestry 5/50   COGNITIVE STATUS: Within functional limits for tasks assessed   SENSATION: WFL, patient reports new numbness R post lower leg, foot, thigh , intermittent in intensity  POSTURE:  Wide base of support, B hip  HAND DOMINANCE:  Right  GAIT: Distance walked: 31' in clinic  Assistive device utilized: None Level of assistance: Complete Independence Comments: wide base, arms mid guard several times, shortened stride length B Range of motion : L ankle pflexion, d flexion limited to 15 degrees each B hip flexion to 92  MMT:  R hip abduction 3-/5,  B hamstrings 3+/5 L ankle 4-/5  Functional tests: Berg 40/56 30 sec sit to stand: 9 reps Gait with horizontal head turns, mild deviation from path Gait with vertical head movements with mod dysfunction, frequent stops   TREATMENT:          DATE:  05/10/23:Therapeutic exercise:  the patient was instructed in the following ex designed to improve her vestibular function, stability for dynamic balance challenges, and gait, bulk LE strength:  Nustep level 5, 5 min UE's and LE's  Standing in corner, narrowed stance, with horizontal head turns 30 reps, able to perform well without balance loss In ll bars for forward heel taps from 2" step light UE support In ll bars for washcloth slides cw, ccw  Seated for long arc quads 3# each leg, 10 reps, 3 sets Standing in ll bars for penguins with red t band around thighs, and for/back rocking with theraband around thighs, to tolerance Standing on airex for alt toe  taps to cone, to tolerance, in ll bars Resisted gait with 20#, lateral, for/back, 5 reps each  05/03/23 NuStep L5 x 6 min  Resisted gait forward and backward x3  S2S x10, then x10 LE on airex On airex alt 6in taps 2x10  Hs curls 20lb 2x10  Leg Ext 5lb 2x10 Seated rows red 2x0   05/01/23: Nustep LE's only level 5, 6 min Seated hamstring curls red t band 3 x 15 Standing in corner, semi tandem, horizontal head turns, alternating lead foot, added this ex to home program Penguins in ll bars with red t band around thighs, to fatigue, mass practice with B hand support Seated long arc quads with 2# 10 reps x 3 Standing on  airex in ll bars for heel toe rocks 10 x 3 Lateral travelling in ll bars over airex Standing alt toe taps, cone, mass practice in ll bars   04/19/23: Instructed in green t band hip ER/abd in sitting, to engage weak R hip musculature    PATIENT EDUCATION:  Education details: POC, goals Person educated: Patient Education method: Explanation, Demonstration, and Tactile cues Education comprehension: verbalized understanding and tactile cues required  HOME EXERCISE PROGRAM: Access Code: [email or web address] URL: [email or web address] Date: 05/01/2023 Prepared by: Linton Rump Notnamed Scholz  Exercises - Gaze Stability (VOR) x 2 Feet Together With Horizontal Head Turns  - 1 x daily - 7 x weekly - 3 sets - 10 reps TBD   ASSESSMENT:  CLINICAL IMPRESSION: Patient is a 82 y.o. female who was participated in skilled physical therapy session today due to unsteady gait, with concurrent diagnosis of osteoporosis and chronic LBP.   Today was her third full session following her initial evaluation.  She tolerated all activities well, on recheck of horizontal head turns she was much more steady than at initial evaluation.    She should benefit from skilled PT to address these deficits.  As she lives alone and has osteoporosis it is important to address her fall risk.  She works 4 mornings a week  so will schedule around her work schedule.   OBJECTIVE IMPAIRMENTS: decreased activity tolerance, decreased balance, difficulty walking, decreased strength, and increased edema.   ACTIVITY LIMITATIONS: carrying, lifting, bending, squatting, stairs, and locomotion level  PARTICIPATION LIMITATIONS: community activity, occupation, and yard work  PERSONAL FACTORS: Age, Behavior pattern, Past/current experiences, Time since onset of injury/illness/exacerbation, and 1-2 comorbidities: pulmonary hypertension, osteoporosis  are also affecting patient's functional outcome.   REHAB POTENTIAL: Good  CLINICAL DECISION MAKING: Stable/uncomplicated  EVALUATION COMPLEXITY: Low   GOALS: Goals reviewed with patient? Yes  SHORT TERM GOALS: Target date: 2 weeks, 05/03/23  I HEP Baseline: Goal status: INITIAL  LONG TERM GOALS: Target date: 06/14/23  Improve Berg from 40 to 45/56 Baseline:  Goal status: INITIAL  2.  Improve gait and balance with patient able to walk 20' with horizontal and vertical head movements without deviation from path Baseline:  Goal status: INITIAL  3.  Improve R hip abduction strength to 4+/5 for better stability for gait  Baseline: 3-/5 Goal status: INITIAL  4.  Improve B hamstrings strength to 4/5 for better control for reaching to floor Baseline: 3+/5 B Goal status: INITIAL   PLAN:  PT FREQUENCY: 2x/week  PT DURATION: 8 weeks  PLANNED INTERVENTIONS: Therapeutic exercises, Therapeutic activity, Neuromuscular re-education, Balance training, Gait training, Patient/Family education, Self Care, and Joint mobilization.  PLAN FOR NEXT SESSION: progress with specific strengthening for R hip abductors, B hamstrings, add VOR ex in standing    Shalea Tomczak L Erasmus Bistline, PTDPT [facility] 05/10/2023, 11:31 AM

## 2023-05-10 NOTE — Therapy (Deleted)
OUTPATIENT PHYSICAL THERAPY EVALUATION   Patient Name: Kerry [name] MRN: [id number] DOB:1941-06-08, 82 y.o., female Today's Date: 05/10/2023  END OF SESSION:     Past Medical History:  Diagnosis Date   Allergy    Anemia    in her 80's   Aortic insufficiency    i   Arthritis    not dx'd- just per pt    Asthma    AS A CHILD   Cancer (HCC)    endometrial cancer with hysterectomy in 1987   Cancer (HCC)    melanoma on face    Cataract    removed both eyes    DCM (dilated cardiomyopathy) (HCC)    EF 50-55%   GERD (gastroesophageal reflux disease)    occ has with diet    History of syncope    HTN (hypertension)    Hyperlipidemia    Mitral regurgitation    mild by echo 03/2023   OSA on CPAP    Pulmonary HTN (HCC)    PASP on echo 03/2023   TIA (transient ischemic attack)    Tricuspid regurgitation    moderate by echo 03/2023   Past Surgical History:  Procedure Laterality Date   ABDOMINAL HYSTERECTOMY     ANKLE FRACTURE SURGERY Left    APPENDECTOMY     CATARACT EXTRACTION, BILATERAL     CHOLECYSTECTOMY     COLONOSCOPY  08/01/2007   Dr Marina Goodell    DENTAL SURGERY     MELANOMA EXCISION     face   ovary removal     right leg  fracture surgery      SKIN BIOPSY     pre-cancerous on face   Patient Active Problem List   Diagnosis Date Noted   Tricuspid regurgitation    Mitral regurgitation    Pulmonary HTN (HCC)    Prediabetes 03/15/2023   History of dehydration 03/15/2023   Osteopenia 09/14/2022   Low back pain with sciatica 08/19/2022   Gait instability 08/19/2022   Hospital discharge follow-up 09/28/2021   Traumatic ecchymosis of head 09/28/2021   Chronic post-traumatic headache, not intractable 09/28/2021   Subdural hematoma (HCC) 09/24/2021   Encephalopathy acute 09/24/2021   Normocytic anemia 09/14/2021   Seasonal allergic rhinitis due to pollen 03/11/2021   DCM (dilated cardiomyopathy) (HCC)    Hyponatremia 06/21/2019   TIA (transient ischemic  attack) 06/20/2019   Aortic insufficiency 08/17/2018   Hyperlipidemia    Right thyroid nodule 02/07/2013   Right carotid bruit 07/16/2012   Ocular migraine 11/04/2011   Hypercholesterolemia 06/01/2011   Essential hypertension    History of syncope    Asthma     PCP: Nadene Rubins, MD  REFERRING PROVIDER: Pollyann Savoy, MD  REFERRING DIAG: unsteady gait, osteopenia  Rationale for Evaluation and Treatment: Rehabilitation  THERAPY DIAG:  No diagnosis found.  ONSET DATE: January 2024   SUBJECTIVE:  SUBJECTIVE STATEMENT: "Tired" no falls or stumbles   PERTINENT HISTORY:  H/o osteoporosis with overall height loss of 3 ",  referred by rheumatologist due to unsteady gait  PAIN:  Are you having pain? Yes: NPRS scale: 0 /10 Pain location: lower back Pain description: hurts with specific bending and walking Aggravating factors: above Relieving factors: sitting  PRECAUTIONS:  Fall and Other: osteoporosis  RED FLAGS: Compression fracture: Yes: possible old remote lumbar compression fx    WEIGHT BEARING RESTRICTIONS:  No  FALLS:  Has patient fallen in last 6 months? Yes. Number of falls 1  LIVING ENVIRONMENT: Lives with: lives alone Lives in: House/apartment Stairs: No Has following equipment at home: Single point cane  OCCUPATION:  Work in retail 5 hrs in dressing room  PLOF:  Independent  PATIENT GOALS:  To avoid falling  NEXT MD VISIT: 2 months   OBJECTIVE:   DIAGNOSTIC FINDINGS:  Osteopenia of multiple sites - 08/23/22: DEXA scan left femoral neck -1.9, no comparison.  No history of a stress fracture.   X-rays showed multilevel spondylosis, L4-5 spondylolisthesis and facet joint arthropathy.  PATIENT SURVEYS:  Modified Oswestry 5/50   COGNITIVE STATUS: Within  functional limits for tasks assessed   SENSATION: WFL, patient reports new numbness R post lower leg, foot, thigh , intermittent in intensity  POSTURE:  Wide base of support, B hip  HAND DOMINANCE:  Right  GAIT: Distance walked: 22' in clinic  Assistive device utilized: None Level of assistance: Complete Independence Comments: wide base, arms mid guard several times, shortened stride length B Range of motion : L ankle pflexion, d flexion limited to 15 degrees each B hip flexion to 92  MMT:  R hip abduction 3-/5,  B hamstrings 3+/5 L ankle 4-/5  Functional tests: Berg 40/56 30 sec sit to stand: 9 reps Gait with horizontal head turns, mild deviation from path Gait with vertical head movements with mod dysfunction, frequent stops   TREATMENT:          DATE:  05/03/23 NuStep L5 x 6 min  Resisted gait forward and backward x3  S2S x10, then x10 LE on airex On airex alt 6in taps 2x10  Hs curls 20lb 2x10  Leg Ext 5lb 2x10 Seated rows red 2x0   05/01/23: Nustep LE's only level 5, 6 min Seated hamstring curls red t band 3 x 15 Standing in corner, semi tandem, horizontal head turns, alternating lead foot, added this ex to home program Penguins in ll bars with red t band around thighs, to fatigue, mass practice with B hand support Seated long arc quads with 2# 10 reps x 3 Standing on airex in ll bars for heel toe rocks 10 x 3 Lateral travelling in ll bars over airex Standing alt toe taps, cone, mass practice in ll bars   04/19/23: Instructed in green t band hip ER/abd in sitting, to engage weak R hip musculature    PATIENT EDUCATION:  Education details: POC, goals Person educated: Patient Education method: Explanation, Demonstration, and Tactile cues Education comprehension: verbalized understanding and tactile cues required  HOME EXERCISE PROGRAM: Access Code: [email or web address] URL: [email or web address] Date: 05/01/2023 Prepared by: Linton Rump Lenay Lovejoy  Exercises -  Gaze Stability (VOR) x 2 Feet Together With Horizontal Head Turns  - 1 x daily - 7 x weekly - 3 sets - 10 reps TBD   ASSESSMENT:  CLINICAL IMPRESSION: Patient is a 82 y.o. female who was participated in skilled physical therapy session today due to unsteady gait, with  concurrent diagnosis of osteoporosis and chronic LBP.   Today was her second full session following her initial evaluation.  Addressed LE strength and balance. Increase fatigue reported with resisted gait. Cha needed at times with alt box taps form airex. Pt stated she was tire post session.  She should benefit from skilled PT to address these deficits.  As she lives alone and has osteoporosis it is important to address her fall risk.  She works 4 mornings a week so will schedule around her work schedule.   OBJECTIVE IMPAIRMENTS: decreased activity tolerance, decreased balance, difficulty walking, decreased strength, and increased edema.   ACTIVITY LIMITATIONS: carrying, lifting, bending, squatting, stairs, and locomotion level  PARTICIPATION LIMITATIONS: community activity, occupation, and yard work  PERSONAL FACTORS: Age, Behavior pattern, Past/current experiences, Time since onset of injury/illness/exacerbation, and 1-2 comorbidities: pulmonary hypertension, osteoporosis  are also affecting patient's functional outcome.   REHAB POTENTIAL: Good  CLINICAL DECISION MAKING: Stable/uncomplicated  EVALUATION COMPLEXITY: Low   GOALS: Goals reviewed with patient? Yes  SHORT TERM GOALS: Target date: 2 weeks, 05/03/23  I HEP Baseline: Goal status: INITIAL  LONG TERM GOALS: Target date: 06/14/23  Improve Berg from 40 to 45/56 Baseline:  Goal status: INITIAL  2.  Improve gait and balance with patient able to walk 20' with horizontal and vertical head movements without deviation from path Baseline:  Goal status: INITIAL  3.  Improve R hip abduction strength to 4+/5 for better stability for gait  Baseline: 3-/5 Goal  status: INITIAL  4.  Improve B hamstrings strength to 4/5 for better control for reaching to floor Baseline: 3+/5 B Goal status: INITIAL   PLAN:  PT FREQUENCY: 2x/week  PT DURATION: 8 weeks  PLANNED INTERVENTIONS: Therapeutic exercises, Therapeutic activity, Neuromuscular re-education, Balance training, Gait training, Patient/Family education, Self Care, and Joint mobilization.  PLAN FOR NEXT SESSION: progress with specific strengthening for R hip abductors, B hamstrings, add VOR ex in standing    Yang Rack L Wahid Holley, PTDPT [facility] 05/10/2023, 11:21 AM

## 2023-05-11 ENCOUNTER — Ambulatory Visit: Payer: Medicare Other | Admitting: Physical Therapy

## 2023-05-11 DIAGNOSIS — R2681 Unsteadiness on feet: Secondary | ICD-10-CM

## 2023-05-11 DIAGNOSIS — M5136 Other intervertebral disc degeneration, lumbar region: Secondary | ICD-10-CM | POA: Diagnosis not present

## 2023-05-11 DIAGNOSIS — M8589 Other specified disorders of bone density and structure, multiple sites: Secondary | ICD-10-CM

## 2023-05-11 NOTE — Therapy (Signed)
OUTPATIENT PHYSICAL THERAPY    Patient Name: Kerry [name] MRN: [id number] DOB:[birth year], 82 y.o., female Today's Date: 05/11/2023  END OF SESSION:  PT End of Session - 05/11/23 1607     Visit Number 5    Date for PT Re-Evaluation 06/14/23    PT Start Time 1608    PT Stop Time 1652    PT Time Calculation (min) 44 min               Past Medical History:  Diagnosis Date   Allergy    Anemia    in her 20's   Aortic insufficiency    i   Arthritis    not dx'd- just per pt    Asthma    AS A CHILD   Cancer (HCC)    endometrial cancer with hysterectomy in 1987   Cancer (HCC)    melanoma on face    Cataract    removed both eyes    DCM (dilated cardiomyopathy) (HCC)    EF 50-55%   GERD (gastroesophageal reflux disease)    occ has with diet    History of syncope    HTN (hypertension)    Hyperlipidemia    Mitral regurgitation    mild by echo 03/2023   OSA on CPAP    Pulmonary HTN (HCC)    PASP on echo 03/2023   TIA (transient ischemic attack)    Tricuspid regurgitation    moderate by echo 03/2023   Past Surgical History:  Procedure Laterality Date   ABDOMINAL HYSTERECTOMY     ANKLE FRACTURE SURGERY Left    APPENDECTOMY     CATARACT EXTRACTION, BILATERAL     CHOLECYSTECTOMY     COLONOSCOPY  08/01/2007   Dr Marina Goodell    DENTAL SURGERY     MELANOMA EXCISION     face   ovary removal     right leg  fracture surgery      SKIN BIOPSY     pre-cancerous on face   Patient Active Problem List   Diagnosis Date Noted   Tricuspid regurgitation    Mitral regurgitation    Pulmonary HTN (HCC)    Prediabetes 03/15/2023   History of dehydration 03/15/2023   Osteopenia 09/14/2022   Low back pain with sciatica 08/19/2022   Gait instability 08/19/2022   Hospital discharge follow-up 09/28/2021   Traumatic ecchymosis of head 09/28/2021   Chronic post-traumatic headache, not intractable 09/28/2021   Subdural hematoma (HCC) 09/24/2021   Encephalopathy acute  09/24/2021   Normocytic anemia 09/14/2021   Seasonal allergic rhinitis due to pollen 03/11/2021   DCM (dilated cardiomyopathy) (HCC)    Hyponatremia 06/21/2019   TIA (transient ischemic attack) 06/20/2019   Aortic insufficiency 08/17/2018   Hyperlipidemia    Right thyroid nodule 02/07/2013   Right carotid bruit 07/16/2012   Ocular migraine 11/04/2011   Hypercholesterolemia 06/01/2011   Essential hypertension    History of syncope    Asthma     PCP: Nadene Rubins, MD  REFERRING PROVIDER: Pollyann Savoy, MD  REFERRING DIAG: unsteady gait, osteopenia  Rationale for Evaluation and Treatment: Rehabilitation  THERAPY DIAG:  Unstable gait  Osteopenia of multiple sites  ONSET DATE: January 2024   SUBJECTIVE:  SUBJECTIVE STATEMENT: Doing okay, just hear yesterday , worked today    PERTINENT HISTORY:  H/o osteoporosis with overall height loss of 3 ",  referred by rheumatologist due to unsteady gait  PAIN:  Are you having pain? Yes: NPRS scale: 0 /10 Pain location: lower back Pain description: hurts with specific bending and walking Aggravating factors: above Relieving factors: sitting  PRECAUTIONS:  Fall and Other: osteoporosis  RED FLAGS: Compression fracture: Yes: possible old remote lumbar compression fx    WEIGHT BEARING RESTRICTIONS:  No  FALLS:  Has patient fallen in last 6 months? Yes. Number of falls 1  LIVING ENVIRONMENT: Lives with: lives alone Lives in: House/apartment Stairs: No Has following equipment at home: Single point cane  OCCUPATION:  Work in retail 5 hrs in dressing room  PLOF:  Independent  PATIENT GOALS:  To avoid falling  NEXT MD VISIT: 2 months   OBJECTIVE:   DIAGNOSTIC FINDINGS:  Osteopenia of multiple sites - 08/23/22: DEXA scan  left femoral neck -1.9, no comparison.  No history of a stress fracture.   X-rays showed multilevel spondylosis, L4-5 spondylolisthesis and facet joint arthropathy.  PATIENT SURVEYS:  Modified Oswestry 5/50   COGNITIVE STATUS: Within functional limits for tasks assessed   SENSATION: WFL, patient reports new numbness R post lower leg, foot, thigh , intermittent in intensity  POSTURE:  Wide base of support, B hip  HAND DOMINANCE:  Right  GAIT: Distance walked: 69' in clinic  Assistive device utilized: None Level of assistance: Complete Independence Comments: wide base, arms mid guard several times, shortened stride length B Range of motion : L ankle pflexion, d flexion limited to 15 degrees each B hip flexion to 92  MMT:  R hip abduction 3-/5,  B hamstrings 3+/5 L ankle 4-/5  Functional tests: Berg 40/56 30 sec sit to stand: 9 reps Gait with horizontal head turns, mild deviation from path Gait with vertical head movements with mod dysfunction, frequent stops   TREATMENT:          DATE:   05/11/23 Nustep L 5 STS on airex 3 sets 5, slightly elevated mat CGA 2# ankle wts HHA on airex alt LE 20 x marching , hip flex,ext and abd Ball toss on airex sup, good righting reaction and no dizziness Red tband hip flex,ext and abd in // bars 12 each Alt step tap 6 in 20 x 2 sets  HS curls 20lb 2x10  Leg Ext 5lb 2x10   05/10/23:Therapeutic exercise:  the patient was instructed in the following ex designed to improve her vestibular function, stability for dynamic balance challenges, and gait, bulk LE strength:  Nustep level 5, 5 min UE's and LE's  Standing in corner, narrowed stance, with horizontal head turns 30 reps, able to perform well without balance loss In ll bars for forward heel taps from 2" step light UE support In ll bars for washcloth slides cw, ccw  Seated for long arc quads 3# each leg, 10 reps, 3 sets Standing in ll bars for penguins with red t band around  thighs, and for/back rocking with theraband around thighs, to tolerance Standing on airex for alt toe taps to cone, to tolerance, in ll bars Resisted gait with 20#, lateral, for/back, 5 reps each  05/03/23 NuStep L5 x 6 min  Resisted gait forward and backward x3  S2S x10, then x10 LE on airex On airex alt 6in taps 2x10  Hs curls 20lb 2x10  Leg Ext 5lb 2x10 Seated rows  red 2x0   05/01/23: Nustep LE's only level 5, 6 min Seated hamstring curls red t band 3 x 15 Standing in corner, semi tandem, horizontal head turns, alternating lead foot, added this ex to home program Penguins in ll bars with red t band around thighs, to fatigue, mass practice with B hand support Seated long arc quads with 2# 10 reps x 3 Standing on airex in ll bars for heel toe rocks 10 x 3 Lateral travelling in ll bars over airex Standing alt toe taps, cone, mass practice in ll bars   04/19/23: Instructed in green t band hip ER/abd in sitting, to engage weak R hip musculature    PATIENT EDUCATION:  Education details: POC, goals Person educated: Patient Education method: Explanation, Demonstration, and Tactile cues Education comprehension: verbalized understanding and tactile cues required  HOME EXERCISE PROGRAM: Access Code: [email or web address] URL: [email or web address] Date: 05/01/2023 Prepared by: Linton Rump Speaks  Exercises - Gaze Stability (VOR) x 2 Feet Together With Horizontal Head Turns  - 1 x daily - 7 x weekly - 3 sets - 10 reps TBD   ASSESSMENT:  CLINICAL IMPRESSION: Patient is a 82 y.o. female who was participated in skilled physical therapy session today due to unsteady gait, with concurrent diagnosis of osteoporosis and chronic LBP.     She tolerated all activities well, but did comment taht 2 days in a row was challenging.   She should benefit from skilled PT to address these deficits.  As she lives alone and has osteoporosis it is important to address her fall risk.   STG met    OBJECTIVE  IMPAIRMENTS: decreased activity tolerance, decreased balance, difficulty walking, decreased strength, and increased edema.   ACTIVITY LIMITATIONS: carrying, lifting, bending, squatting, stairs, and locomotion level  PARTICIPATION LIMITATIONS: community activity, occupation, and yard work  PERSONAL FACTORS: Age, Behavior pattern, Past/current experiences, Time since onset of injury/illness/exacerbation, and 1-2 comorbidities: pulmonary hypertension, osteoporosis  are also affecting patient's functional outcome.   REHAB POTENTIAL: Good  CLINICAL DECISION MAKING: Stable/uncomplicated  EVALUATION COMPLEXITY: Low   GOALS: Goals reviewed with patient? Yes  SHORT TERM GOALS: Target date: 2 weeks, 05/03/23  I HEP Baseline: Goal status: 05/11/23 MET  LONG TERM GOALS: Target date: 06/14/23  Improve Berg from 40 to 45/56 Baseline:  Goal status: INITIAL  2.  Improve gait and balance with patient able to walk 20' with horizontal and vertical head movements without deviation from path Baseline:  Goal status: INITIAL  3.  Improve R hip abduction strength to 4+/5 for better stability for gait  Baseline: 3-/5 Goal status: INITIAL  4.  Improve B hamstrings strength to 4/5 for better control for reaching to floor Baseline: 3+/5 B Goal status: INITIAL   PLAN:  PT FREQUENCY: 2x/week  PT DURATION: 8 weeks  PLANNED INTERVENTIONS: Therapeutic exercises, Therapeutic activity, Neuromuscular re-education, Balance training, Gait training, Patient/Family education, Self Care, and Joint mobilization.  PLAN FOR NEXT SESSION: progress with specific strengthening for R hip abductors, B hamstrings, add VOR ex in standing    Isabel Ardila,ANGIE, PTA 05/11/2023, 4:09 PM [facility] [address] [facility]. [place], [name], [address] Phone: [phone]   Fax:  [phone]  Patient Details  Name: Kerry [name] MRN: [id number] Date of Birth:  1941/01/14 Referring Provider:  Pollyann Savoy, MD

## 2023-05-17 ENCOUNTER — Encounter: Payer: Self-pay | Admitting: Physical Therapy

## 2023-05-17 ENCOUNTER — Ambulatory Visit: Payer: Medicare Other | Attending: Rheumatology | Admitting: Physical Therapy

## 2023-05-17 DIAGNOSIS — R2681 Unsteadiness on feet: Secondary | ICD-10-CM | POA: Diagnosis not present

## 2023-05-17 DIAGNOSIS — M8589 Other specified disorders of bone density and structure, multiple sites: Secondary | ICD-10-CM | POA: Diagnosis not present

## 2023-05-17 DIAGNOSIS — M5136 Other intervertebral disc degeneration, lumbar region: Secondary | ICD-10-CM | POA: Diagnosis not present

## 2023-05-17 NOTE — Therapy (Signed)
OUTPATIENT PHYSICAL THERAPY    Patient Name: Kerry [name] MRN: [id number] DOB:07-30-1941, 82 y.o., female Today's Date: 05/17/2023  END OF SESSION:  PT End of Session - 05/17/23 0802     Visit Number 6    Date for PT Re-Evaluation 06/14/23    PT Start Time 0800    PT Stop Time 0845    PT Time Calculation (min) 45 min    Activity Tolerance Patient tolerated treatment well    Behavior During Therapy WFL for tasks assessed/performed               Past Medical History:  Diagnosis Date   Allergy    Anemia    in her 20's   Aortic insufficiency    i   Arthritis    not dx'd- just per pt    Asthma    AS A CHILD   Cancer (HCC)    endometrial cancer with hysterectomy in 1987   Cancer (HCC)    melanoma on face    Cataract    removed both eyes    DCM (dilated cardiomyopathy) (HCC)    EF 50-55%   GERD (gastroesophageal reflux disease)    occ has with diet    History of syncope    HTN (hypertension)    Hyperlipidemia    Mitral regurgitation    mild by echo 03/2023   OSA on CPAP    Pulmonary HTN (HCC)    PASP on echo 03/2023   TIA (transient ischemic attack)    Tricuspid regurgitation    moderate by echo 03/2023   Past Surgical History:  Procedure Laterality Date   ABDOMINAL HYSTERECTOMY     ANKLE FRACTURE SURGERY Left    APPENDECTOMY     CATARACT EXTRACTION, BILATERAL     CHOLECYSTECTOMY     COLONOSCOPY  08/01/2007   Dr Marina Goodell    DENTAL SURGERY     MELANOMA EXCISION     face   ovary removal     right leg  fracture surgery      SKIN BIOPSY     pre-cancerous on face   Patient Active Problem List   Diagnosis Date Noted   Tricuspid regurgitation    Mitral regurgitation    Pulmonary HTN (HCC)    Prediabetes 03/15/2023   History of dehydration 03/15/2023   Osteopenia 09/14/2022   Low back pain with sciatica 08/19/2022   Gait instability 08/19/2022   Hospital discharge follow-up 09/28/2021   Traumatic ecchymosis of head 09/28/2021   Chronic  post-traumatic headache, not intractable 09/28/2021   Subdural hematoma (HCC) 09/24/2021   Encephalopathy acute 09/24/2021   Normocytic anemia 09/14/2021   Seasonal allergic rhinitis due to pollen 03/11/2021   DCM (dilated cardiomyopathy) (HCC)    Hyponatremia 06/21/2019   TIA (transient ischemic attack) 06/20/2019   Aortic insufficiency 08/17/2018   Hyperlipidemia    Right thyroid nodule 02/07/2013   Right carotid bruit 07/16/2012   Ocular migraine 11/04/2011   Hypercholesterolemia 06/01/2011   Essential hypertension    History of syncope    Asthma     PCP: Nadene Rubins, MD  REFERRING PROVIDER: Pollyann Savoy, MD  REFERRING DIAG: unsteady gait, osteopenia  Rationale for Evaluation and Treatment: Rehabilitation  THERAPY DIAG:  Unstable gait  Osteopenia of multiple sites  DDD (degenerative disc disease), lumbar  ONSET DATE: January 2024   SUBJECTIVE:  SUBJECTIVE STATEMENT:  "No too bad"   PERTINENT HISTORY:  H/o osteoporosis with overall height loss of 3 ",  referred by rheumatologist due to unsteady gait  PAIN:  Are you having pain? Yes: NPRS scale: 4/10 Pain location: Back of legs, HS area Pain description: hurts with specific bending and walking Aggravating factors: above Relieving factors: sitting  PRECAUTIONS:  Fall and Other: osteoporosis  RED FLAGS: Compression fracture: Yes: possible old remote lumbar compression fx    WEIGHT BEARING RESTRICTIONS:  No  FALLS:  Has patient fallen in last 6 months? Yes. Number of falls 1  LIVING ENVIRONMENT: Lives with: lives alone Lives in: House/apartment Stairs: No Has following equipment at home: Single point cane  OCCUPATION:  Work in retail 5 hrs in dressing room  PLOF:  Independent  PATIENT GOALS:  To avoid  falling  NEXT MD VISIT: 2 months   OBJECTIVE:   DIAGNOSTIC FINDINGS:  Osteopenia of multiple sites - 08/23/22: DEXA scan left femoral neck -1.9, no comparison.  No history of a stress fracture.   X-rays showed multilevel spondylosis, L4-5 spondylolisthesis and facet joint arthropathy.  PATIENT SURVEYS:  Modified Oswestry 5/50   COGNITIVE STATUS: Within functional limits for tasks assessed   SENSATION: WFL, patient reports new numbness R post lower leg, foot, thigh , intermittent in intensity  POSTURE:  Wide base of support, B hip  HAND DOMINANCE:  Right  GAIT: Distance walked: 66' in clinic  Assistive device utilized: None Level of assistance: Complete Independence Comments: wide base, arms mid guard several times, shortened stride length B Range of motion : L ankle pflexion, d flexion limited to 15 degrees each B hip flexion to 92  MMT:  R hip abduction 3-/5,  B hamstrings 3+/5 L ankle 4-/5  Functional tests: Berg 40/56 30 sec sit to stand: 9 reps Gait with horizontal head turns, mild deviation from path Gait with vertical head movements with mod dysfunction, frequent stops   TREATMENT:          DATE:  05/17/23 Nustep L 5 6 min Resisted gait 20lb 4 way x 3 each 6in step ups x 10 each HS curls 20lb 2x12  Leg Ext 5lb 2x12 Stairs alt pattern 1 rail R 4 & 6 in stairs 20 steps  S2S LE on airex slightly elevated mat 2x10  05/11/23 Nustep L 5 STS on airex 3 sets 5, slightly elevated mat CGA 2# ankle wts HHA on airex alt LE 20 x marching , hip flex,ext and abd Ball toss on airex sup, good righting reaction and no dizziness Red tband hip flex,ext and abd in // bars 12 each Alt step tap 6 in 20 x 2 sets  HS curls 20lb 2x10  Leg Ext 5lb 2x10   05/10/23:Therapeutic exercise:  the patient was instructed in the following ex designed to improve her vestibular function, stability for dynamic balance challenges, and gait, bulk LE strength:  Nustep level 5, 5 min  UE's and LE's  Standing in corner, narrowed stance, with horizontal head turns 30 reps, able to perform well without balance loss In ll bars for forward heel taps from 2" step light UE support In ll bars for washcloth slides cw, ccw  Seated for long arc quads 3# each leg, 10 reps, 3 sets Standing in ll bars for penguins with red t band around thighs, and for/back rocking with theraband around thighs, to tolerance Standing on airex for alt toe taps to cone, to tolerance, in ll bars Resisted  gait with 20#, lateral, for/back, 5 reps each  05/03/23 NuStep L5 x 6 min  Resisted gait forward and backward x3  S2S x10, then x10 LE on airex On airex alt 6in taps 2x10  Hs curls 20lb 2x10  Leg Ext 5lb 2x10 Seated rows red 2x0   05/01/23: Nustep LE's only level 5, 6 min Seated hamstring curls red t band 3 x 15 Standing in corner, semi tandem, horizontal head turns, alternating lead foot, added this ex to home program Penguins in ll bars with red t band around thighs, to fatigue, mass practice with B hand support Seated long arc quads with 2# 10 reps x 3 Standing on airex in ll bars for heel toe rocks 10 x 3 Lateral travelling in ll bars over airex Standing alt toe taps, cone, mass practice in ll bars   04/19/23: Instructed in green t band hip ER/abd in sitting, to engage weak R hip musculature    PATIENT EDUCATION:  Education details: POC, goals Person educated: Patient Education method: Explanation, Demonstration, and Tactile cues Education comprehension: verbalized understanding and tactile cues required  HOME EXERCISE PROGRAM: Access Code: [email or web address] URL: [email or web address] Date: 05/01/2023 Prepared by: Linton Rump Speaks  Exercises - Gaze Stability (VOR) x 2 Feet Together With Horizontal Head Turns  - 1 x daily - 7 x weekly - 3 sets - 10 reps TBD   ASSESSMENT:  CLINICAL IMPRESSION: Patient is a 82 y.o. female who was participated in skilled physical therapy session  today due to unsteady gait, with concurrent diagnosis of osteoporosis and chronic LBP.     She tolerated all activities well, but did comment that resisted side step was "work". Cue to prevent valgus stress at knee wit sit to stands. Some uncertainty with step ups. She should benefit from skilled PT to address these deficits.  As she lives alone and has osteoporosis it is important to address her fall risk.   STG met    OBJECTIVE IMPAIRMENTS: decreased activity tolerance, decreased balance, difficulty walking, decreased strength, and increased edema.   ACTIVITY LIMITATIONS: carrying, lifting, bending, squatting, stairs, and locomotion level  PARTICIPATION LIMITATIONS: community activity, occupation, and yard work  PERSONAL FACTORS: Age, Behavior pattern, Past/current experiences, Time since onset of injury/illness/exacerbation, and 1-2 comorbidities: pulmonary hypertension, osteoporosis  are also affecting patient's functional outcome.   REHAB POTENTIAL: Good  CLINICAL DECISION MAKING: Stable/uncomplicated  EVALUATION COMPLEXITY: Low   GOALS: Goals reviewed with patient? Yes  SHORT TERM GOALS: Target date: 2 weeks, 05/03/23  I HEP Baseline: Goal status: 05/11/23 MET  LONG TERM GOALS: Target date: 06/14/23  Improve Berg from 40 to 45/56 Baseline:  Goal status: INITIAL  2.  Improve gait and balance with patient able to walk 20' with horizontal and vertical head movements without deviation from path Baseline:  Goal status: INITIAL  3.  Improve R hip abduction strength to 4+/5 for better stability for gait  Baseline: 3-/5 Goal status: INITIAL  4.  Improve B hamstrings strength to 4/5 for better control for reaching to floor Baseline: 3+/5 B Goal status: INITIAL   PLAN:  PT FREQUENCY: 2x/week  PT DURATION: 8 weeks  PLANNED INTERVENTIONS: Therapeutic exercises, Therapeutic activity, Neuromuscular re-education, Balance training, Gait training, Patient/Family education, Self  Care, and Joint mobilization.  PLAN FOR NEXT SESSION: progress with specific strengthening for R hip abductors, B hamstrings, add VOR ex in standing    Grayce Sessions, PTA

## 2023-05-19 ENCOUNTER — Other Ambulatory Visit: Payer: Self-pay

## 2023-05-19 ENCOUNTER — Ambulatory Visit: Payer: Medicare Other

## 2023-05-19 DIAGNOSIS — M8589 Other specified disorders of bone density and structure, multiple sites: Secondary | ICD-10-CM | POA: Diagnosis not present

## 2023-05-19 DIAGNOSIS — M5136 Other intervertebral disc degeneration, lumbar region: Secondary | ICD-10-CM | POA: Diagnosis not present

## 2023-05-19 DIAGNOSIS — R2681 Unsteadiness on feet: Secondary | ICD-10-CM | POA: Diagnosis not present

## 2023-05-19 NOTE — Therapy (Signed)
OUTPATIENT PHYSICAL THERAPY    Patient Name: Kerry [name] MRN: [id number] DOB:04-06-1941, 82 y.o., female Today's Date: 05/19/2023  END OF SESSION:  PT End of Session - 05/19/23 0835     Visit Number 7    Date for PT Re-Evaluation 06/14/23    PT Start Time 0800    PT Stop Time 0845    PT Time Calculation (min) 45 min    Activity Tolerance Patient tolerated treatment well    Behavior During Therapy [facility] for tasks assessed/performed                Past Medical History:  Diagnosis Date   Allergy    Anemia    in her 20's   Aortic insufficiency    i   Arthritis    not dx'd- just per pt    Asthma    AS A CHILD   Cancer (HCC)    endometrial cancer with hysterectomy in 1987   Cancer (HCC)    melanoma on face    Cataract    removed both eyes    DCM (dilated cardiomyopathy) (HCC)    EF 50-55%   GERD (gastroesophageal reflux disease)    occ has with diet    History of syncope    HTN (hypertension)    Hyperlipidemia    Mitral regurgitation    mild by echo 03/2023   OSA on CPAP    Pulmonary HTN (HCC)    PASP on echo 03/2023   TIA (transient ischemic attack)    Tricuspid regurgitation    moderate by echo 03/2023   Past Surgical History:  Procedure Laterality Date   ABDOMINAL HYSTERECTOMY     ANKLE FRACTURE SURGERY Left    APPENDECTOMY     CATARACT EXTRACTION, BILATERAL     CHOLECYSTECTOMY     COLONOSCOPY  08/01/2007   Dr Marina Goodell    DENTAL SURGERY     MELANOMA EXCISION     face   ovary removal     right leg  fracture surgery      SKIN BIOPSY     pre-cancerous on face   Patient Active Problem List   Diagnosis Date Noted   Tricuspid regurgitation    Mitral regurgitation    Pulmonary HTN (HCC)    Prediabetes 03/15/2023   History of dehydration 03/15/2023   Osteopenia 09/14/2022   Low back pain with sciatica 08/19/2022   Gait instability 08/19/2022   Hospital discharge follow-up 09/28/2021   Traumatic ecchymosis of head 09/28/2021   Chronic  post-traumatic headache, not intractable 09/28/2021   Subdural hematoma (HCC) 09/24/2021   Encephalopathy acute 09/24/2021   Normocytic anemia 09/14/2021   Seasonal allergic rhinitis due to pollen 03/11/2021   DCM (dilated cardiomyopathy) (HCC)    Hyponatremia 06/21/2019   TIA (transient ischemic attack) 06/20/2019   Aortic insufficiency 08/17/2018   Hyperlipidemia    Right thyroid nodule 02/07/2013   Right carotid bruit 07/16/2012   Ocular migraine 11/04/2011   Hypercholesterolemia 06/01/2011   Essential hypertension    History of syncope    Asthma     PCP: Nadene Rubins, MD  REFERRING PROVIDER: Pollyann Savoy, MD  REFERRING DIAG: unsteady gait, osteopenia  Rationale for Evaluation and Treatment: Rehabilitation  THERAPY DIAG:  Unstable gait  Osteopenia of multiple sites  DDD (degenerative disc disease), lumbar  ONSET DATE: January 2024   SUBJECTIVE:  SUBJECTIVE STATEMENT:  Some improvement noted in overall balance, negotiating curbs better.  Not getting too sore when finished with PT sessions   PERTINENT HISTORY:  H/o osteoporosis with overall height loss of 3 ",  referred by rheumatologist due to unsteady gait  PAIN:  Are you having pain? Yes: NPRS scale: 4/10 Pain location: Back of legs, HS area Pain description: hurts with specific bending and walking Aggravating factors: above Relieving factors: sitting  PRECAUTIONS:  Fall and Other: osteoporosis  RED FLAGS: Compression fracture: Yes: possible old remote lumbar compression fx    WEIGHT BEARING RESTRICTIONS:  No  FALLS:  Has patient fallen in last 6 months? Yes. Number of falls 1  LIVING ENVIRONMENT: Lives with: lives alone Lives in: House/apartment Stairs: No Has following equipment at home: Single point  cane  OCCUPATION:  Work in retail 5 hrs in dressing room  PLOF:  Independent  PATIENT GOALS:  To avoid falling  NEXT MD VISIT: 2 months   OBJECTIVE:   DIAGNOSTIC FINDINGS:  Osteopenia of multiple sites - 08/23/22: DEXA scan left femoral neck -1.9, no comparison.  No history of a stress fracture.   X-rays showed multilevel spondylosis, L4-5 spondylolisthesis and facet joint arthropathy.  PATIENT SURVEYS:  Modified Oswestry 5/50   COGNITIVE STATUS: Within functional limits for tasks assessed   SENSATION: WFL, patient reports new numbness R post lower leg, foot, thigh , intermittent in intensity  POSTURE:  Wide base of support, B hip  HAND DOMINANCE:  Right  GAIT: Distance walked: 75' in clinic  Assistive device utilized: None Level of assistance: Complete Independence Comments: wide base, arms mid guard several times, shortened stride length B Range of motion : L ankle pflexion, d flexion limited to 15 degrees each B hip flexion to 92  MMT:  R hip abduction 3-/5,  B hamstrings 3+/5 L ankle 4-/5  Functional tests: Berg 40/56 30 sec sit to stand: 9 reps Gait with horizontal head turns, mild deviation from path Gait with vertical head movements with mod dysfunction, frequent stops   TREATMENT:          DATE:  05/19/23: Therapeutic exercises: instructed in the following therex to improve her stability and LE strength, reduce fall risk: Recumbent bicycle, level 2.3 6 min Seated long arc quads, 5# cuff wts, 2 x12 Gait outdoors on sidewalk, and down /up curb, cues to step closer to curb for safety Sit to stand elevated mat , feet on airex 5 x 1, the 5 x with 2# mediball punches Resisted gait, 5 x F, B, side, 20# Hamstring curls 20#,  x12 In ll bars for penguin side to side and for/back for proximal hip and spine stability, mass practice, green t band around thighs. Forward step ups 6" step with rails 10 x each  05/17/23 Nustep L 5 6 min Resisted gait 20lb 4 way x  3 each 6in step ups x 10 each HS curls 20lb 2x12  Leg Ext 5lb 2x12 Stairs alt pattern 1 rail R 4 & 6 in stairs 20 steps  S2S LE on airex slightly elevated mat 2x10  05/11/23 Nustep L 5 STS on airex 3 sets 5, slightly elevated mat CGA 2# ankle wts HHA on airex alt LE 20 x marching , hip flex,ext and abd Ball toss on airex sup, good righting reaction and no dizziness Red tband hip flex,ext and abd in // bars 12 each Alt step tap 6 in 20 x 2 sets  HS curls 20lb 2x10  Leg Ext 5lb 2x10   05/10/23:Therapeutic exercise:  the patient was instructed in the following ex designed to improve her vestibular function, stability for dynamic balance challenges, and gait, bulk LE strength:  Nustep level 5, 5 min UE's and LE's  Standing in corner, narrowed stance, with horizontal head turns 30 reps, able to perform well without balance loss In ll bars for forward heel taps from 2" step light UE support In ll bars for washcloth slides cw, ccw  Seated for long arc quads 3# each leg, 10 reps, 3 sets Standing in ll bars for penguins with red t band around thighs, and for/back rocking with theraband around thighs, to tolerance Standing on airex for alt toe taps to cone, to tolerance, in ll bars Resisted gait with 20#, lateral, for/back, 5 reps each  05/03/23 NuStep L5 x 6 min  Resisted gait forward and backward x3  S2S x10, then x10 LE on airex On airex alt 6in taps 2x10  Hs curls 20lb 2x10  Leg Ext 5lb 2x10 Seated rows red 2x0   05/01/23: Nustep LE's only level 5, 6 min Seated hamstring curls red t band 3 x 15 Standing in corner, semi tandem, horizontal head turns, alternating lead foot, added this ex to home program Penguins in ll bars with red t band around thighs, to fatigue, mass practice with B hand support Seated long arc quads with 2# 10 reps x 3 Standing on airex in ll bars for heel toe rocks 10 x 3 Lateral travelling in ll bars over airex Standing alt toe taps, cone, mass practice in  ll bars   04/19/23: Instructed in green t band hip ER/abd in sitting, to engage weak R hip musculature    PATIENT EDUCATION:  Education details: POC, goals Person educated: Patient Education method: Explanation, Demonstration, and Tactile cues Education comprehension: verbalized understanding and tactile cues required  HOME EXERCISE PROGRAM: Access Code: [email or web address] URL: [email or web address] Date: 05/01/2023 Prepared by: Linton Rump Shakenya Stoneberg  Exercises - Gaze Stability (VOR) x 2 Feet Together With Horizontal Head Turns  - 1 x daily - 7 x weekly - 3 sets - 10 reps TBD   ASSESSMENT:  CLINICAL IMPRESSION: Patient is a 81 y.o. female who was participated in skilled physical therapy session today due to unsteady gait, with concurrent diagnosis of osteoporosis and chronic LBP.     She is tolerating  activities well,we are gradually advancing the intensity of strengthening and balance activities. She should benefit from skilled PT to address these deficits.  As she lives alone and has osteoporosis it is important to address her fall risk.   STG met    OBJECTIVE IMPAIRMENTS: decreased activity tolerance, decreased balance, difficulty walking, decreased strength, and increased edema.   ACTIVITY LIMITATIONS: carrying, lifting, bending, squatting, stairs, and locomotion level  PARTICIPATION LIMITATIONS: community activity, occupation, and yard work  PERSONAL FACTORS: Age, Behavior pattern, Past/current experiences, Time since onset of injury/illness/exacerbation, and 1-2 comorbidities: pulmonary hypertension, osteoporosis  are also affecting patient's functional outcome.   REHAB POTENTIAL: Good  CLINICAL DECISION MAKING: Stable/uncomplicated  EVALUATION COMPLEXITY: Low   GOALS: Goals reviewed with patient? Yes  SHORT TERM GOALS: Target date: 2 weeks, 05/03/23  I HEP Baseline: Goal status: 05/11/23 MET  LONG TERM GOALS: Target date: 06/14/23  Improve Berg from 40 to  45/56 Baseline:  Goal status: INITIAL  2.  Improve gait and balance with patient able to walk 20' with horizontal and vertical head movements without deviation from path Baseline:  Goal status:  INITIAL  3.  Improve R hip abduction strength to 4+/5 for better stability for gait  Baseline: 3-/5 Goal status: INITIAL  4.  Improve B hamstrings strength to 4/5 for better control for reaching to floor Baseline: 3+/5 B Goal status: INITIAL   PLAN:  PT FREQUENCY: 2x/week  PT DURATION: 8 weeks  PLANNED INTERVENTIONS: Therapeutic exercises, Therapeutic activity, Neuromuscular re-education, Balance training, Gait training, Patient/Family education, Self Care, and Joint mobilization.  PLAN FOR NEXT SESSION: progress with specific strengthening for R hip abductors, B hamstrings, add VOR ex in standing    Torianne Laflam L Sashia Campas, PT

## 2023-05-24 ENCOUNTER — Ambulatory Visit: Payer: Medicare Other

## 2023-05-24 ENCOUNTER — Other Ambulatory Visit: Payer: Self-pay

## 2023-05-24 DIAGNOSIS — R2681 Unsteadiness on feet: Secondary | ICD-10-CM

## 2023-05-24 DIAGNOSIS — M5136 Other intervertebral disc degeneration, lumbar region: Secondary | ICD-10-CM

## 2023-05-24 DIAGNOSIS — M8589 Other specified disorders of bone density and structure, multiple sites: Secondary | ICD-10-CM

## 2023-05-24 NOTE — Therapy (Signed)
OUTPATIENT PHYSICAL THERAPY    Patient Name: Kerry [name] MRN: [id number] DOB:1941/01/31, 82 y.o., female Today's Date: 05/24/2023  END OF SESSION:  PT End of Session - 05/24/23 1259     Visit Number 8    Date for PT Re-Evaluation 06/14/23    Progress Note Due on Visit 10    PT Start Time 1300    PT Stop Time 1343    PT Time Calculation (min) 43 min    Activity Tolerance Patient tolerated treatment well    Behavior During Therapy WFL for tasks assessed/performed                 Past Medical History:  Diagnosis Date   Allergy    Anemia    in her 20's   Aortic insufficiency    i   Arthritis    not dx'd- just per pt    Asthma    AS A CHILD   Cancer (HCC)    endometrial cancer with hysterectomy in 1987   Cancer (HCC)    melanoma on face    Cataract    removed both eyes    DCM (dilated cardiomyopathy) (HCC)    EF 50-55%   GERD (gastroesophageal reflux disease)    occ has with diet    History of syncope    HTN (hypertension)    Hyperlipidemia    Mitral regurgitation    mild by echo 03/2023   OSA on CPAP    Pulmonary HTN (HCC)    PASP on echo 03/2023   TIA (transient ischemic attack)    Tricuspid regurgitation    moderate by echo 03/2023   Past Surgical History:  Procedure Laterality Date   ABDOMINAL HYSTERECTOMY     ANKLE FRACTURE SURGERY Left    APPENDECTOMY     CATARACT EXTRACTION, BILATERAL     CHOLECYSTECTOMY     COLONOSCOPY  08/01/2007   Dr Marina Goodell    DENTAL SURGERY     MELANOMA EXCISION     face   ovary removal     right leg  fracture surgery      SKIN BIOPSY     pre-cancerous on face   Patient Active Problem List   Diagnosis Date Noted   Tricuspid regurgitation    Mitral regurgitation    Pulmonary HTN (HCC)    Prediabetes 03/15/2023   History of dehydration 03/15/2023   Osteopenia 09/14/2022   Low back pain with sciatica 08/19/2022   Gait instability 08/19/2022   Hospital discharge follow-up 09/28/2021   Traumatic  ecchymosis of head 09/28/2021   Chronic post-traumatic headache, not intractable 09/28/2021   Subdural hematoma (HCC) 09/24/2021   Encephalopathy acute 09/24/2021   Normocytic anemia 09/14/2021   Seasonal allergic rhinitis due to pollen 03/11/2021   DCM (dilated cardiomyopathy) (HCC)    Hyponatremia 06/21/2019   TIA (transient ischemic attack) 06/20/2019   Aortic insufficiency 08/17/2018   Hyperlipidemia    Right thyroid nodule 02/07/2013   Right carotid bruit 07/16/2012   Ocular migraine 11/04/2011   Hypercholesterolemia 06/01/2011   Essential hypertension    History of syncope    Asthma     PCP: Nadene Rubins, MD  REFERRING PROVIDER: Pollyann Savoy, MD  REFERRING DIAG: unsteady gait, osteopenia  Rationale for Evaluation and Treatment: Rehabilitation  THERAPY DIAG:  Unstable gait  Osteopenia of multiple sites  DDD (degenerative disc disease), lumbar  ONSET DATE: January 2024   SUBJECTIVE:  SUBJECTIVE STATEMENT:  Some improvement noted in overall balance, lower back is more sore recently unable whter to attribute to activities in PT or just in general.  PERTINENT HISTORY:  H/o osteoporosis with overall height loss of 3 ",  referred by rheumatologist due to unsteady gait  PAIN:  Are you having pain? Yes: NPRS scale: 4/10 Pain location: Back of legs, HS area Pain description: hurts with specific bending and walking Aggravating factors: above Relieving factors: sitting  PRECAUTIONS:  Fall and Other: osteoporosis  RED FLAGS: Compression fracture: Yes: possible old remote lumbar compression fx    WEIGHT BEARING RESTRICTIONS:  No  FALLS:  Has patient fallen in last 6 months? Yes. Number of falls 1  LIVING ENVIRONMENT: Lives with: lives alone Lives in:  House/apartment Stairs: No Has following equipment at home: Single point cane  OCCUPATION:  Work in retail 5 hrs in dressing room  PLOF:  Independent  PATIENT GOALS:  To avoid falling  NEXT MD VISIT: 2 months   OBJECTIVE:   DIAGNOSTIC FINDINGS:  Osteopenia of multiple sites - 08/23/22: DEXA scan left femoral neck -1.9, no comparison.  No history of a stress fracture.   X-rays showed multilevel spondylosis, L4-5 spondylolisthesis and facet joint arthropathy.  PATIENT SURVEYS:  Modified Oswestry 5/50   COGNITIVE STATUS: Within functional limits for tasks assessed   SENSATION: WFL, patient reports new numbness R post lower leg, foot, thigh , intermittent in intensity  POSTURE:  Wide base of support, B hip  HAND DOMINANCE:  Right  GAIT: Distance walked: 4' in clinic  Assistive device utilized: None Level of assistance: Complete Independence Comments: wide base, arms mid guard several times, shortened stride length B Range of motion : L ankle pflexion, d flexion limited to 15 degrees each B hip flexion to 92  MMT:  R hip abduction 3-/5,  B hamstrings 3+/5 L ankle 4-/5  Functional tests: Berg 40/56 30 sec sit to stand: 9 reps Gait with horizontal head turns, mild deviation from path Gait with vertical head movements with mod dysfunction, frequent stops   TREATMENT:          DATE:  05/24/23: Nustep Ue's and LE's 6 min, level 5 Seated B hamstring curls 20#, 3 x 12 Seated B long arc quads 5#, 3 x 12 Sit to stand from slightly elevated mat with feet on airex, onset 5  Then one set 5 with red mediball forward punches In ll bars for penguin side to side and for/back for proximal hip and spine stability, mass practice, green t band around thighs. In ll bars for staggered standing lead foot on 6" step with horizontal head turns 15 reps each Wall ladder climbs, ipsilateral arm and leg up wall, 15 x each side  Side stepping laterally on 8" step 12 x each side   Forward lunges in ll bars, lead foot on compliant side of BOSU, 15 x , CGA from PT  05/19/23: Therapeutic exercises: instructed in the following therex to improve her stability and LE strength, reduce fall risk: Recumbent bicycle, level 2.3 6 min Seated long arc quads, 5# cuff wts, 2 x12 Gait outdoors on sidewalk, and down /up curb, cues to step closer to curb for safety Sit to stand elevated mat , feet on airex 5 x 1, the 5 x with 2# mediball punches Resisted gait, 5 x F, B, side, 20# Hamstring curls 20#,  x12 In ll bars for penguin side to side and for/back for proximal hip and spine stability,  mass practice, green t band around thighs. Forward step ups 6" step with rails 10 x each  05/17/23 Nustep L 5 6 min Resisted gait 20lb 4 way x 3 each 6in step ups x 10 each HS curls 20lb 2x12  Leg Ext 5lb 2x12 Stairs alt pattern 1 rail R 4 & 6 in stairs 20 steps  S2S LE on airex slightly elevated mat 2x10  05/11/23 Nustep L 5 STS on airex 3 sets 5, slightly elevated mat CGA 2# ankle wts HHA on airex alt LE 20 x marching , hip flex,ext and abd Ball toss on airex sup, good righting reaction and no dizziness Red tband hip flex,ext and abd in // bars 12 each Alt step tap 6 in 20 x 2 sets  HS curls 20lb 2x10  Leg Ext 5lb 2x10   05/10/23:Therapeutic exercise:  the patient was instructed in the following ex designed to improve her vestibular function, stability for dynamic balance challenges, and gait, bulk LE strength:  Nustep level 5, 5 min UE's and LE's  Standing in corner, narrowed stance, with horizontal head turns 30 reps, able to perform well without balance loss In ll bars for forward heel taps from 2" step light UE support In ll bars for washcloth slides cw, ccw  Seated for long arc quads 3# each leg, 10 reps, 3 sets Standing in ll bars for penguins with red t band around thighs, and for/back rocking with theraband around thighs, to tolerance Standing on airex for alt toe taps to  cone, to tolerance, in ll bars Resisted gait with 20#, lateral, for/back, 5 reps each  05/03/23 NuStep L5 x 6 min  Resisted gait forward and backward x3  S2S x10, then x10 LE on airex On airex alt 6in taps 2x10  Hs curls 20lb 2x10  Leg Ext 5lb 2x10 Seated rows red 2x0   05/01/23: Nustep LE's only level 5, 6 min Seated hamstring curls red t band 3 x 15 Standing in corner, semi tandem, horizontal head turns, alternating lead foot, added this ex to home program Penguins in ll bars with red t band around thighs, to fatigue, mass practice with B hand support Seated long arc quads with 2# 10 reps x 3 Standing on airex in ll bars for heel toe rocks 10 x 3 Lateral travelling in ll bars over airex Standing alt toe taps, cone, mass practice in ll bars   04/19/23: Instructed in green t band hip ER/abd in sitting, to engage weak R hip musculature    PATIENT EDUCATION:  Education details: POC, goals Person educated: Patient Education method: Explanation, Demonstration, and Tactile cues Education comprehension: verbalized understanding and tactile cues required  HOME EXERCISE PROGRAM: Access Code: [email or web address] URL: [email or web address] Date: 05/01/2023 Prepared by: Linton Rump Malyah Ohlrich  Exercises - Gaze Stability (VOR) x 2 Feet Together With Horizontal Head Turns  - 1 x daily - 7 x weekly - 3 sets - 10 reps TBD   ASSESSMENT:  CLINICAL IMPRESSION: Patient is a 82 y.o. female who was participated in skilled physical therapy session today due to unsteady gait, with concurrent diagnosis of osteoporosis and chronic LBP.     She is tolerating  activities well,although experiencing moe lower back pain in the last week so we adapted program today, did not utilize resisted gait.  She also had difficulty with lateral step ups, leading with R LE vs L. We are  gradually advancing the intensity of strengthening and balance activities. She should continue to benefit from  skilled PT to address these  deficits.  As she lives alone and has osteoporosis it is important to address her fall risk.   STG met    OBJECTIVE IMPAIRMENTS: decreased activity tolerance, decreased balance, difficulty walking, decreased strength, and increased edema.   ACTIVITY LIMITATIONS: carrying, lifting, bending, squatting, stairs, and locomotion level  PARTICIPATION LIMITATIONS: community activity, occupation, and yard work  PERSONAL FACTORS: Age, Behavior pattern, Past/current experiences, Time since onset of injury/illness/exacerbation, and 1-2 comorbidities: pulmonary hypertension, osteoporosis  are also affecting patient's functional outcome.   REHAB POTENTIAL: Good  CLINICAL DECISION MAKING: Stable/uncomplicated  EVALUATION COMPLEXITY: Low   GOALS: Goals reviewed with patient? Yes  SHORT TERM GOALS: Target date: 2 weeks, 05/03/23  I HEP Baseline: Goal status: 05/11/23 MET  LONG TERM GOALS: Target date: 06/14/23  Improve Berg from 40 to 45/56 Baseline:  Goal status: INITIAL  2.  Improve gait and balance with patient able to walk 20' with horizontal and vertical head movements without deviation from path Baseline:  Goal status: INITIAL  3.  Improve R hip abduction strength to 4+/5 for better stability for gait  Baseline: 3-/5 Goal status: INITIAL  4.  Improve B hamstrings strength to 4/5 for better control for reaching to floor Baseline: 3+/5 B Goal status: INITIAL   PLAN:  PT FREQUENCY: 2x/week  PT DURATION: 8 weeks  PLANNED INTERVENTIONS: Therapeutic exercises, Therapeutic activity, Neuromuscular re-education, Balance training, Gait training, Patient/Family education, Self Care, and Joint mobilization.  PLAN FOR NEXT SESSION: progress with specific strengthening for R hip abductors, B hamstrings, add VOR ex in standing    Clent Damore L Maysoon Lozada, PT

## 2023-05-26 ENCOUNTER — Encounter: Payer: Self-pay | Admitting: Physical Therapy

## 2023-05-26 ENCOUNTER — Ambulatory Visit: Payer: Medicare Other | Admitting: Physical Therapy

## 2023-05-26 DIAGNOSIS — M8589 Other specified disorders of bone density and structure, multiple sites: Secondary | ICD-10-CM | POA: Diagnosis not present

## 2023-05-26 DIAGNOSIS — R2681 Unsteadiness on feet: Secondary | ICD-10-CM

## 2023-05-26 DIAGNOSIS — M5136 Other intervertebral disc degeneration, lumbar region: Secondary | ICD-10-CM | POA: Diagnosis not present

## 2023-05-26 NOTE — Therapy (Signed)
OUTPATIENT PHYSICAL THERAPY    Patient Name: Kerry [name] MRN: [id number] DOB:06-06-1941, 82 y.o., female Today's Date: 05/26/2023  END OF SESSION:  PT End of Session - 05/26/23 0800     Visit Number 9    Date for PT Re-Evaluation 06/14/23    PT Start Time 0800    PT Stop Time 0845    PT Time Calculation (min) 45 min    Activity Tolerance Patient tolerated treatment well    Behavior During Therapy WFL for tasks assessed/performed                 Past Medical History:  Diagnosis Date   Allergy    Anemia    in her 20's   Aortic insufficiency    i   Arthritis    not dx'd- just per pt    Asthma    AS A CHILD   Cancer (HCC)    endometrial cancer with hysterectomy in 1987   Cancer (HCC)    melanoma on face    Cataract    removed both eyes    DCM (dilated cardiomyopathy) (HCC)    EF 50-55%   GERD (gastroesophageal reflux disease)    occ has with diet    History of syncope    HTN (hypertension)    Hyperlipidemia    Mitral regurgitation    mild by echo 03/2023   OSA on CPAP    Pulmonary HTN (HCC)    PASP on echo 03/2023   TIA (transient ischemic attack)    Tricuspid regurgitation    moderate by echo 03/2023   Past Surgical History:  Procedure Laterality Date   ABDOMINAL HYSTERECTOMY     ANKLE FRACTURE SURGERY Left    APPENDECTOMY     CATARACT EXTRACTION, BILATERAL     CHOLECYSTECTOMY     COLONOSCOPY  08/01/2007   Dr Marina Goodell    DENTAL SURGERY     MELANOMA EXCISION     face   ovary removal     right leg  fracture surgery      SKIN BIOPSY     pre-cancerous on face   Patient Active Problem List   Diagnosis Date Noted   Tricuspid regurgitation    Mitral regurgitation    Pulmonary HTN (HCC)    Prediabetes 03/15/2023   History of dehydration 03/15/2023   Osteopenia 09/14/2022   Low back pain with sciatica 08/19/2022   Gait instability 08/19/2022   Hospital discharge follow-up 09/28/2021   Traumatic ecchymosis of head 09/28/2021   Chronic  post-traumatic headache, not intractable 09/28/2021   Subdural hematoma (HCC) 09/24/2021   Encephalopathy acute 09/24/2021   Normocytic anemia 09/14/2021   Seasonal allergic rhinitis due to pollen 03/11/2021   DCM (dilated cardiomyopathy) (HCC)    Hyponatremia 06/21/2019   TIA (transient ischemic attack) 06/20/2019   Aortic insufficiency 08/17/2018   Hyperlipidemia    Right thyroid nodule 02/07/2013   Right carotid bruit 07/16/2012   Ocular migraine 11/04/2011   Hypercholesterolemia 06/01/2011   Essential hypertension    History of syncope    Asthma     PCP: Nadene Rubins, MD  REFERRING PROVIDER: Pollyann Savoy, MD  REFERRING DIAG: unsteady gait, osteopenia  Rationale for Evaluation and Treatment: Rehabilitation  THERAPY DIAG:  Unstable gait  Osteopenia of multiple sites  DDD (degenerative disc disease), lumbar  ONSET DATE: January 2024   SUBJECTIVE:  SUBJECTIVE STATEMENT: "Im up and I'm walking"  PERTINENT HISTORY:  H/o osteoporosis with overall height loss of 3 ",  referred by rheumatologist due to unsteady gait  PAIN:  Are you having pain? Yes: NPRS scale: 2/10 Pain location: 2 Pain description: hurts with specific bending and walking Aggravating factors: above Relieving factors: sitting  PRECAUTIONS:  Fall and Other: osteoporosis  RED FLAGS: Compression fracture: Yes: possible old remote lumbar compression fx    WEIGHT BEARING RESTRICTIONS:  No  FALLS:  Has patient fallen in last 6 months? Yes. Number of falls 1  LIVING ENVIRONMENT: Lives with: lives alone Lives in: House/apartment Stairs: No Has following equipment at home: Single point cane  OCCUPATION:  Work in retail 5 hrs in dressing room  PLOF:  Independent  PATIENT GOALS:  To avoid  falling  NEXT MD VISIT: 2 months   OBJECTIVE:   DIAGNOSTIC FINDINGS:  Osteopenia of multiple sites - 08/23/22: DEXA scan left femoral neck -1.9, no comparison.  No history of a stress fracture.   X-rays showed multilevel spondylosis, L4-5 spondylolisthesis and facet joint arthropathy.  PATIENT SURVEYS:  Modified Oswestry 5/50   COGNITIVE STATUS: Within functional limits for tasks assessed   SENSATION: WFL, patient reports new numbness R post lower leg, foot, thigh , intermittent in intensity  POSTURE:  Wide base of support, B hip  HAND DOMINANCE:  Right  GAIT: Distance walked: 57' in clinic  Assistive device utilized: None Level of assistance: Complete Independence Comments: wide base, arms mid guard several times, shortened stride length B Range of motion : L ankle pflexion, d flexion limited to 15 degrees each B hip flexion to 92  MMT:  R hip abduction 3-/5,  B hamstrings 3+/5 L ankle 4-/5  Functional tests: Berg 40/56 30 sec sit to stand: 9 reps Gait with horizontal head turns, mild deviation from path Gait with vertical head movements with mod dysfunction, frequent stops   TREATMENT:          DATE:  05/26/23 Bike L 1.5 x 4 min NuStep L5 x 3 min Checked Goals MMT  Hip Add 4+/5  R HS 4/5  L HS 4/5 HS curls 25lb 2x12 Leg Ext 5lb 2x12 Alt 6in box taps from airex 2x10 Side step on/off airex   05/24/23: Nustep Ue's and LE's 6 min, level 5 Seated B hamstring curls 20#, 3 x 12 Seated B long arc quads 5#, 3 x 12 Sit to stand from slightly elevated mat with feet on airex, onset 5  Then one set 5 with red mediball forward punches In ll bars for penguin side to side and for/back for proximal hip and spine stability, mass practice, green t band around thighs. In ll bars for staggered standing lead foot on 6" step with horizontal head turns 15 reps each Wall ladder climbs, ipsilateral arm and leg up wall, 15 x each side  Side stepping laterally on 8" step 12 x  each side  Forward lunges in ll bars, lead foot on compliant side of BOSU, 15 x , CGA from PT  05/19/23: Therapeutic exercises: instructed in the following therex to improve her stability and LE strength, reduce fall risk: Recumbent bicycle, level 2.3 6 min Seated long arc quads, 5# cuff wts, 2 x12 Gait outdoors on sidewalk, and down /up curb, cues to step closer to curb for safety Sit to stand elevated mat , feet on airex 5 x 1, the 5 x with 2# mediball punches Resisted gait, 5 x F,  B, side, 20# Hamstring curls 20#,  x12 In ll bars for penguin side to side and for/back for proximal hip and spine stability, mass practice, green t band around thighs. Forward step ups 6" step with rails 10 x each  05/17/23 Nustep L 5 6 min Resisted gait 20lb 4 way x 3 each 6in step ups x 10 each HS curls 20lb 2x12  Leg Ext 5lb 2x12 Stairs alt pattern 1 rail R 4 & 6 in stairs 20 steps  S2S LE on airex slightly elevated mat 2x10  05/11/23 Nustep L 5 STS on airex 3 sets 5, slightly elevated mat CGA 2# ankle wts HHA on airex alt LE 20 x marching , hip flex,ext and abd Ball toss on airex sup, good righting reaction and no dizziness Red tband hip flex,ext and abd in // bars 12 each Alt step tap 6 in 20 x 2 sets  HS curls 20lb 2x10  Leg Ext 5lb 2x10   05/10/23:Therapeutic exercise:  the patient was instructed in the following ex designed to improve her vestibular function, stability for dynamic balance challenges, and gait, bulk LE strength:  Nustep level 5, 5 min UE's and LE's  Standing in corner, narrowed stance, with horizontal head turns 30 reps, able to perform well without balance loss In ll bars for forward heel taps from 2" step light UE support In ll bars for washcloth slides cw, ccw  Seated for long arc quads 3# each leg, 10 reps, 3 sets Standing in ll bars for penguins with red t band around thighs, and for/back rocking with theraband around thighs, to tolerance Standing on airex for alt toe  taps to cone, to tolerance, in ll bars Resisted gait with 20#, lateral, for/back, 5 reps each  05/03/23 NuStep L5 x 6 min  Resisted gait forward and backward x3  S2S x10, then x10 LE on airex On airex alt 6in taps 2x10  Hs curls 20lb 2x10  Leg Ext 5lb 2x10 Seated rows red 2x0  PATIENT EDUCATION:  Education details: POC, goals Person educated: Patient Education method: Explanation, Demonstration, and Tactile cues Education comprehension: verbalized understanding and tactile cues required  HOME EXERCISE PROGRAM: Access Code: [email or web address] URL: [email or web address] Date: 05/01/2023 Prepared by: Linton Rump Speaks  Exercises - Gaze Stability (VOR) x 2 Feet Together With Horizontal Head Turns  - 1 x daily - 7 x weekly - 3 sets - 10 reps TBD   ASSESSMENT:  CLINICAL IMPRESSION: Patient is a 82 y.o. female who was participated in skilled physical therapy session today due to unsteady gait, with concurrent diagnosis of osteoporosis and chronic LBP. Pt has progressed increasing her hamstring and hip abd strength meeting goal. She has also increased her BERG score meeting goal. She had some difficulty with side steps on ans off airex. Cue for full ROM needed with HS curls. She should continue to benefit from skilled PT to address these deficits.  As she lives alone and has osteoporosis it is important to address her fall risk.   STG met    OBJECTIVE IMPAIRMENTS: decreased activity tolerance, decreased balance, difficulty walking, decreased strength, and increased edema.   ACTIVITY LIMITATIONS: carrying, lifting, bending, squatting, stairs, and locomotion level  PARTICIPATION LIMITATIONS: community activity, occupation, and yard work  PERSONAL FACTORS: Age, Behavior pattern, Past/current experiences, Time since onset of injury/illness/exacerbation, and 1-2 comorbidities: pulmonary hypertension, osteoporosis  are also affecting patient's functional outcome.   REHAB POTENTIAL:  Good  CLINICAL DECISION MAKING: Stable/uncomplicated  EVALUATION COMPLEXITY: Low  GOALS: Goals reviewed with patient? Yes  SHORT TERM GOALS: Target date: 2 weeks, 05/03/23  I HEP Baseline: Goal status: 05/11/23 MET  LONG TERM GOALS: Target date: 06/14/23  Improve Berg from 40 to 45/56 Baseline:  Goal status: 45/56 Met 05/26/23  2.  Improve gait and balance with patient able to walk 20' with horizontal and vertical head movements without deviation from path Baseline:  Goal status: INITIAL  3.  Improve R hip abduction strength to 4+/5 for better stability for gait  Baseline: 3-/5 Goal status: Met 05/26/23  4.  Improve B hamstrings strength to 4/5 for better control for reaching to floor Baseline: 3+/5 B Goal status: Met 05/26/23   PLAN:  PT FREQUENCY: 2x/week  PT DURATION: 8 weeks  PLANNED INTERVENTIONS: Therapeutic exercises, Therapeutic activity, Neuromuscular re-education, Balance training, Gait training, Patient/Family education, Self Care, and Joint mobilization.  PLAN FOR NEXT SESSION: progress with specific strengthening for R hip abductors, B hamstrings, add VOR ex in standing    Grayce Sessions, PTA

## 2023-05-29 ENCOUNTER — Ambulatory Visit (HOSPITAL_COMMUNITY)
Admission: RE | Admit: 2023-05-29 | Discharge: 2023-05-29 | Disposition: A | Discharge status: Home / Self Care | Payer: Medicare Other | Source: Ambulatory Visit | Attending: Cardiology | Admitting: Cardiology

## 2023-05-29 ENCOUNTER — Encounter (HOSPITAL_COMMUNITY)
Admission: RE | Admit: 2023-05-29 | Discharge: 2023-05-29 | Disposition: A | Discharge status: Home / Self Care | Payer: Medicare Other | Source: Ambulatory Visit | Attending: Cardiology | Admitting: Cardiology

## 2023-05-29 DIAGNOSIS — I272 Pulmonary hypertension, unspecified: Secondary | ICD-10-CM | POA: Diagnosis not present

## 2023-05-29 DIAGNOSIS — J45909 Unspecified asthma, uncomplicated: Secondary | ICD-10-CM | POA: Diagnosis not present

## 2023-05-29 DIAGNOSIS — J4520 Mild intermittent asthma, uncomplicated: Secondary | ICD-10-CM

## 2023-05-29 DIAGNOSIS — R0602 Shortness of breath: Secondary | ICD-10-CM | POA: Diagnosis not present

## 2023-05-29 LAB — PULMONARY FUNCTION TEST
DL/VA % pred: 73 %
DL/VA: 3.01 ml/min/mmHg/L
DLCO unc % pred: 54 %
DLCO unc: 9.88 ml/min/mmHg
FEF 25-75 Post: 1.41 L/s
FEF 25-75 Pre: 1.14 L/s
FEF2575-%Change-Post: 23 %
FEF2575-%Pred-Post: 108 %
FEF2575-%Pred-Pre: 87 %
FEV1-%Change-Post: 4 %
FEV1-%Pred-Post: 83 %
FEV1-%Pred-Pre: 79 %
FEV1-Post: 1.51 L
FEV1-Pre: 1.45 L
FEV1FVC-%Change-Post: 0 %
FEV1FVC-%Pred-Pre: 105 %
FEV6-%Change-Post: 6 %
FEV6-%Pred-Post: 85 %
FEV6-%Pred-Pre: 80 %
FEV6-Post: 1.97 L
FEV6-Pre: 1.85 L
FEV6FVC-%Change-Post: 0 %
FEV6FVC-%Pred-Post: 106 %
FEV6FVC-%Pred-Pre: 105 %
FVC-%Change-Post: 5 %
FVC-%Pred-Post: 80 %
FVC-%Pred-Pre: 76 %
FVC-Post: 1.97 L
FVC-Pre: 1.87 L
Post FEV1/FVC ratio: 77 %
Post FEV6/FVC ratio: 100 %
Pre FEV1/FVC ratio: 77 %
Pre FEV6/FVC Ratio: 99 %
RV % pred: 105 %
RV: 2.49 L
TLC % pred: 90 %
TLC: 4.43 L

## 2023-05-29 MED ORDER — TECHNETIUM TO 99M ALBUMIN AGGREGATED
4.3000 | Freq: Once | INTRAVENOUS | Status: AC | PRN
  Administered 2023-05-29: 4.3 via INTRAVENOUS

## 2023-05-29 MED ORDER — ALBUTEROL SULFATE (2.5 MG/3ML) 0.083% IN NEBU
2.5000 mg | INHALATION_SOLUTION | Freq: Once | RESPIRATORY_TRACT | Status: AC
Start: 2023-05-29 — End: 2023-05-29
  Administered 2023-05-29: 2.5 mg via RESPIRATORY_TRACT

## 2023-05-31 ENCOUNTER — Other Ambulatory Visit: Payer: Self-pay

## 2023-05-31 ENCOUNTER — Ambulatory Visit: Payer: Medicare Other

## 2023-05-31 DIAGNOSIS — R2681 Unsteadiness on feet: Secondary | ICD-10-CM

## 2023-05-31 DIAGNOSIS — M5136 Other intervertebral disc degeneration, lumbar region: Secondary | ICD-10-CM | POA: Diagnosis not present

## 2023-05-31 DIAGNOSIS — M8589 Other specified disorders of bone density and structure, multiple sites: Secondary | ICD-10-CM

## 2023-05-31 NOTE — Therapy (Signed)
OUTPATIENT PHYSICAL THERAPY   Progress Note Reporting Period 04/19/23 to 05/31/23  See note below for Objective Data and Assessment of Progress/Goals.     Patient Name: Kerry [name] MRN: [id number] DOB:Jul 07, 1941, 82 y.o., female Today's Date: 05/31/2023  END OF SESSION:  PT End of Session - 05/31/23 0847     Visit Number 10    Date for PT Re-Evaluation 06/14/23    Progress Note Due on Visit 20    PT Start Time 0847    PT Stop Time 0930    PT Time Calculation (min) 43 min    Activity Tolerance Patient tolerated treatment well                  Past Medical History:  Diagnosis Date   Allergy    Anemia    in her 20's   Aortic insufficiency    i   Arthritis    not dx'd- just per pt    Asthma    AS A CHILD   Cancer (HCC)    endometrial cancer with hysterectomy in 1987   Cancer (HCC)    melanoma on face    Cataract    removed both eyes    DCM (dilated cardiomyopathy) (HCC)    EF 50-55%   GERD (gastroesophageal reflux disease)    occ has with diet    History of syncope    HTN (hypertension)    Hyperlipidemia    Mitral regurgitation    mild by echo 03/2023   OSA on CPAP    Pulmonary HTN (HCC)    PASP on echo 03/2023   TIA (transient ischemic attack)    Tricuspid regurgitation    moderate by echo 03/2023   Past Surgical History:  Procedure Laterality Date   ABDOMINAL HYSTERECTOMY     ANKLE FRACTURE SURGERY Left    APPENDECTOMY     CATARACT EXTRACTION, BILATERAL     CHOLECYSTECTOMY     COLONOSCOPY  08/01/2007   Dr Marina Goodell    DENTAL SURGERY     MELANOMA EXCISION     face   ovary removal     right leg  fracture surgery      SKIN BIOPSY     pre-cancerous on face   Patient Active Problem List   Diagnosis Date Noted   Tricuspid regurgitation    Mitral regurgitation    Pulmonary HTN (HCC)    Prediabetes 03/15/2023   History of dehydration 03/15/2023   Osteopenia 09/14/2022   Low back pain with sciatica 08/19/2022   Gait instability  08/19/2022   Hospital discharge follow-up 09/28/2021   Traumatic ecchymosis of head 09/28/2021   Chronic post-traumatic headache, not intractable 09/28/2021   Subdural hematoma (HCC) 09/24/2021   Encephalopathy acute 09/24/2021   Normocytic anemia 09/14/2021   Seasonal allergic rhinitis due to pollen 03/11/2021   DCM (dilated cardiomyopathy) (HCC)    Hyponatremia 06/21/2019   TIA (transient ischemic attack) 06/20/2019   Aortic insufficiency 08/17/2018   Hyperlipidemia    Right thyroid nodule 02/07/2013   Right carotid bruit 07/16/2012   Ocular migraine 11/04/2011   Hypercholesterolemia 06/01/2011   Essential hypertension    History of syncope    Asthma     PCP: Nadene Rubins, MD  REFERRING PROVIDER: Pollyann Savoy, MD  REFERRING DIAG: unsteady gait, osteopenia  Rationale for Evaluation and Treatment: Rehabilitation  THERAPY DIAG:  Unstable gait  Osteopenia of multiple sites  DDD (degenerative disc disease), lumbar  ONSET DATE: January 2024  SUBJECTIVE:                                                                                                                                                                                           SUBJECTIVE STATEMENT: "Im up and I'm walking, no problems, still sore lower back but that is all the time  PERTINENT HISTORY:  H/o osteoporosis with overall height loss of 3 ",  referred by rheumatologist due to unsteady gait  PAIN:  Are you having pain? Yes: NPRS scale: 2/10 Pain location: 2 Pain description: hurts with specific bending and walking Aggravating factors: above Relieving factors: sitting  PRECAUTIONS:  Fall and Other: osteoporosis  RED FLAGS: Compression fracture: Yes: possible old remote lumbar compression fx    WEIGHT BEARING RESTRICTIONS:  No  FALLS:  Has patient fallen in last 6 months? Yes. Number of falls 1  LIVING ENVIRONMENT: Lives with: lives alone Lives in: House/apartment Stairs:  No Has following equipment at home: Single point cane  OCCUPATION:  Work in retail 5 hrs in dressing room  PLOF:  Independent  PATIENT GOALS:  To avoid falling  NEXT MD VISIT: 2 months   OBJECTIVE:   DIAGNOSTIC FINDINGS:  Osteopenia of multiple sites - 08/23/22: DEXA scan left femoral neck -1.9, no comparison.  No history of a stress fracture.   X-rays showed multilevel spondylosis, L4-5 spondylolisthesis and facet joint arthropathy.  PATIENT SURVEYS:  Modified Oswestry 5/50   COGNITIVE STATUS: Within functional limits for tasks assessed   SENSATION: WFL, patient reports new numbness R post lower leg, foot, thigh , intermittent in intensity  POSTURE:  Wide base of support, B hip  HAND DOMINANCE:  Right  GAIT: Distance walked: 66' in clinic  Assistive device utilized: None Level of assistance: Complete Independence Comments: wide base, arms mid guard several times, shortened stride length B Range of motion : L ankle pflexion, d flexion limited to 15 degrees each B hip flexion to 92  MMT:  R hip abduction 3-/5,  B hamstrings 3+/5 L ankle 4-/5  Functional tests: Berg 40/56 30 sec sit to stand: 9 reps Gait with horizontal head turns, mild deviation from path Gait with vertical head movements with mod dysfunction, frequent stops   TREATMENT:          DATE:  05/31/23:  Reassessed BERG Gait with horizontal and vertical head movements Nustep level 5, 6 min UE;s and LE's  B knee flexion 25# 2 x 12 B knee ext 10#, 2 x 12 In ll bars for lunges on compliant side of BOSU 15 x each, light UE support. In ll bars for red theraband around  thighs, for penguins, for. Back rocking to engage hip stabilizers  05/26/23 Bike L 1.5 x 4 min NuStep L5 x 3 min Checked Goals MMT  Hip Add 4+/5  R HS 4/5  L HS 4/5 HS curls 25lb 2x12 Leg Ext 5lb 2x12 Alt 6in box taps from airex 2x10 Side step on/off airex   05/24/23: Nustep Ue's and LE's 6 min, level 5 Seated B hamstring  curls 20#, 3 x 12 Seated B long arc quads 5#, 3 x 12 Sit to stand from slightly elevated mat with feet on airex, onset 5  Then one set 5 with red mediball forward punches In ll bars for penguin side to side and for/back for proximal hip and spine stability, mass practice, green t band around thighs. In ll bars for staggered standing lead foot on 6" step with horizontal head turns 15 reps each Wall ladder climbs, ipsilateral arm and leg up wall, 15 x each side  Side stepping laterally on 8" step 12 x each side  Forward lunges in ll bars, lead foot on compliant side of BOSU, 15 x , CGA from PT  05/19/23: Therapeutic exercises: instructed in the following therex to improve her stability and LE strength, reduce fall risk: Recumbent bicycle, level 2.3 6 min Seated long arc quads, 5# cuff wts, 2 x12 Gait outdoors on sidewalk, and down /up curb, cues to step closer to curb for safety Sit to stand elevated mat , feet on airex 5 x 1, the 5 x with 2# mediball punches Resisted gait, 5 x F, B, side, 20# Hamstring curls 20#,  x12 In ll bars for penguin side to side and for/back for proximal hip and spine stability, mass practice, green t band around thighs. Forward step ups 6" step with rails 10 x each  05/17/23 Nustep L 5 6 min Resisted gait 20lb 4 way x 3 each 6in step ups x 10 each HS curls 20lb 2x12  Leg Ext 5lb 2x12 Stairs alt pattern 1 rail R 4 & 6 in stairs 20 steps  S2S LE on airex slightly elevated mat 2x10  05/11/23 Nustep L 5 STS on airex 3 sets 5, slightly elevated mat CGA 2# ankle wts HHA on airex alt LE 20 x marching , hip flex,ext and abd Ball toss on airex sup, good righting reaction and no dizziness Red tband hip flex,ext and abd in // bars 12 each Alt step tap 6 in 20 x 2 sets  HS curls 20lb 2x10  Leg Ext 5lb 2x10   05/10/23:Therapeutic exercise:  the patient was instructed in the following ex designed to improve her vestibular function, stability for dynamic balance  challenges, and gait, bulk LE strength:  Nustep level 5, 5 min UE's and LE's  Standing in corner, narrowed stance, with horizontal head turns 30 reps, able to perform well without balance loss In ll bars for forward heel taps from 2" step light UE support In ll bars for washcloth slides cw, ccw  Seated for long arc quads 3# each leg, 10 reps, 3 sets Standing in ll bars for penguins with red t band around thighs, and for/back rocking with theraband around thighs, to tolerance Standing on airex for alt toe taps to cone, to tolerance, in ll bars Resisted gait with 20#, lateral, for/back, 5 reps each  05/03/23 NuStep L5 x 6 min  Resisted gait forward and backward x3  S2S x10, then x10 LE on airex On airex alt 6in taps 2x10  Hs curls  20lb 2x10  Leg Ext 5lb 2x10 Seated rows red 2x0  PATIENT EDUCATION:  Education details: POC, goals Person educated: Patient Education method: Explanation, Demonstration, and Tactile cues Education comprehension: verbalized understanding and tactile cues required  HOME EXERCISE PROGRAM: Access Code: [email or web address] URL: [email or web address] Date: 05/01/2023 Prepared by: Linton Rump Alinna Siple  Exercises - Gaze Stability (VOR) x 2 Feet Together With Horizontal Head Turns  - 1 x daily - 7 x weekly - 3 sets - 10 reps TBD   ASSESSMENT:  CLINICAL IMPRESSION: Patient is a 82 y.o. female who was participated in skilled physical therapy session today due to unsteady gait, with concurrent diagnosis of osteoporosis and chronic LBP. Pt has progressed increasing her hamstring and hip abd strength meeting goal. She has also increased her BERG score meeting goal. We discussed her progress today, and recommended to her continuing through a gym membership to maintain her strength gains and thereby assist her with maintaining her safety and balance. She is going to continue for 2 more weeks, 1 x week then dc.  OBJECTIVE IMPAIRMENTS: decreased activity tolerance, decreased  balance, difficulty walking, decreased strength, and increased edema.   ACTIVITY LIMITATIONS: carrying, lifting, bending, squatting, stairs, and locomotion level  PARTICIPATION LIMITATIONS: community activity, occupation, and yard work  PERSONAL FACTORS: Age, Behavior pattern, Past/current experiences, Time since onset of injury/illness/exacerbation, and 1-2 comorbidities: pulmonary hypertension, osteoporosis  are also affecting patient's functional outcome.   REHAB POTENTIAL: Good  CLINICAL DECISION MAKING: Stable/uncomplicated  EVALUATION COMPLEXITY: Low   GOALS: Goals reviewed with patient? Yes  SHORT TERM GOALS: Target date: 2 weeks, 05/03/23  I HEP Baseline: Goal status: 05/11/23 MET  LONG TERM GOALS: Target date: 06/14/23  Improve Berg from 40 to 45/56 Baseline:  Goal status: 45/56 Met 05/26/23 05/31/23: 51/56  2.  Improve gait and balance with patient able to walk 20' with horizontal and vertical head movements without deviation from path Baseline:  Goal status: INITIAL9/18/24:   3.  Improve R hip abduction strength to 4+/5 for better stability for gait  Baseline: 3-/5 Goal status: Met 05/26/23  4.  Improve B hamstrings strength to 4/5 for better control for reaching to floor Baseline: 3+/5 B Goal status: Met 05/26/23   PLAN:  PT FREQUENCY: 2x/week  PT DURATION: 8 weeks  PLANNED INTERVENTIONS: Therapeutic exercises, Therapeutic activity, Neuromuscular re-education, Balance training, Gait training, Patient/Family education, Self Care, and Joint mobilization.  PLAN FOR NEXT SESSION: progress with specific strengthening for R hip abductors, B hamstrings, add VOR ex in standing    Kjersti Dittmer L Jaila Schellhorn, PT,DPT, OCS

## 2023-06-02 ENCOUNTER — Telehealth: Payer: Self-pay

## 2023-06-02 DIAGNOSIS — J849 Interstitial pulmonary disease, unspecified: Secondary | ICD-10-CM

## 2023-06-02 NOTE — Telephone Encounter (Signed)
-----   Message from Nurse Sherri P sent at 05/31/2023  6:26 PM EDT ----- Alcario Drought, forwarding to you as Tresa Endo said you were in follow up tomorrow. ----- Message ----- From: Quintella Reichert, MD Sent: 05/30/2023   3:23 PM EDT To: Mliss Sax, MD; [address] Triage  PFTs show severe reduction in Diffusing capacity which indicates some type of interstitial lung disease >>please get in with Dr. Judeth Horn

## 2023-06-02 NOTE — Telephone Encounter (Signed)
Call to patient to discuss results of PFT's. No answer, left detailed message per DPR explaining that PFTs show severe reduction in Diffusing capacity which indicates some type of interstitial lung disease. Dr. Mayford Knife recommends referral to Dr. Judeth Horn, referral placed. Advised patient to call our office if any questions and someone from Dr. Laurena Spies office would be calling to schedule.

## 2023-06-06 ENCOUNTER — Ambulatory Visit: Payer: Medicare Other | Admitting: Physical Therapy

## 2023-06-06 ENCOUNTER — Encounter: Payer: Self-pay | Admitting: Physical Therapy

## 2023-06-06 DIAGNOSIS — M8589 Other specified disorders of bone density and structure, multiple sites: Secondary | ICD-10-CM | POA: Diagnosis not present

## 2023-06-06 DIAGNOSIS — M5136 Other intervertebral disc degeneration, lumbar region: Secondary | ICD-10-CM | POA: Diagnosis not present

## 2023-06-06 DIAGNOSIS — R2681 Unsteadiness on feet: Secondary | ICD-10-CM

## 2023-06-06 NOTE — Therapy (Signed)
OUTPATIENT PHYSICAL THERAPY    Patient Name: Kerry [name] MRN: [id number] DOB:05-Jul-1941, 82 y.o., female Today's Date: 06/06/2023  END OF SESSION:  PT End of Session - 06/06/23 0800     Visit Number 11    Date for PT Re-Evaluation 06/14/23    PT Start Time 0800    PT Stop Time 0845    PT Time Calculation (min) 45 min    Activity Tolerance Patient tolerated treatment well    Behavior During Therapy WFL for tasks assessed/performed                  Past Medical History:  Diagnosis Date   Allergy    Anemia    in her 20's   Aortic insufficiency    i   Arthritis    not dx'd- just per pt    Asthma    AS A CHILD   Cancer (HCC)    endometrial cancer with hysterectomy in 1987   Cancer (HCC)    melanoma on face    Cataract    removed both eyes    DCM (dilated cardiomyopathy) (HCC)    EF 50-55%   GERD (gastroesophageal reflux disease)    occ has with diet    History of syncope    HTN (hypertension)    Hyperlipidemia    Mitral regurgitation    mild by echo 03/2023   OSA on CPAP    Pulmonary HTN (HCC)    PASP on echo 03/2023   TIA (transient ischemic attack)    Tricuspid regurgitation    moderate by echo 03/2023   Past Surgical History:  Procedure Laterality Date   ABDOMINAL HYSTERECTOMY     ANKLE FRACTURE SURGERY Left    APPENDECTOMY     CATARACT EXTRACTION, BILATERAL     CHOLECYSTECTOMY     COLONOSCOPY  08/01/2007   Dr Marina Goodell    DENTAL SURGERY     MELANOMA EXCISION     face   ovary removal     right leg  fracture surgery      SKIN BIOPSY     pre-cancerous on face   Patient Active Problem List   Diagnosis Date Noted   Tricuspid regurgitation    Mitral regurgitation    Pulmonary HTN (HCC)    Prediabetes 03/15/2023   History of dehydration 03/15/2023   Osteopenia 09/14/2022   Low back pain with sciatica 08/19/2022   Gait instability 08/19/2022   Hospital discharge follow-up 09/28/2021   Traumatic ecchymosis of head 09/28/2021    Chronic post-traumatic headache, not intractable 09/28/2021   Subdural hematoma (HCC) 09/24/2021   Encephalopathy acute 09/24/2021   Normocytic anemia 09/14/2021   Seasonal allergic rhinitis due to pollen 03/11/2021   DCM (dilated cardiomyopathy) (HCC)    Hyponatremia 06/21/2019   TIA (transient ischemic attack) 06/20/2019   Aortic insufficiency 08/17/2018   Hyperlipidemia    Right thyroid nodule 02/07/2013   Right carotid bruit 07/16/2012   Ocular migraine 11/04/2011   Hypercholesterolemia 06/01/2011   Essential hypertension    History of syncope    Asthma     PCP: Nadene Rubins, MD  REFERRING PROVIDER: Pollyann Savoy, MD  REFERRING DIAG: unsteady gait, osteopenia  Rationale for Evaluation and Treatment: Rehabilitation  THERAPY DIAG:  Osteopenia of multiple sites  DDD (degenerative disc disease), lumbar  Unstable gait  ONSET DATE: January 2024   SUBJECTIVE:  SUBJECTIVE STATEMENT: "Not bad" lower back has not been bothering her at all  PERTINENT HISTORY:  H/o osteoporosis with overall height loss of 3 ",  referred by rheumatologist due to unsteady gait  PAIN:  Are you having pain? Yes: NPRS scale: 0/10 Pain location: 2 Pain description: hurts with specific bending and walking Aggravating factors: above Relieving factors: sitting  PRECAUTIONS:  Fall and Other: osteoporosis  RED FLAGS: Compression fracture: Yes: possible old remote lumbar compression fx    WEIGHT BEARING RESTRICTIONS:  No  FALLS:  Has patient fallen in last 6 months? Yes. Number of falls 1  LIVING ENVIRONMENT: Lives with: lives alone Lives in: House/apartment Stairs: No Has following equipment at home: Single point cane  OCCUPATION:  Work in retail 5 hrs in dressing room  PLOF:   Independent  PATIENT GOALS:  To avoid falling  NEXT MD VISIT: 2 months   OBJECTIVE:   DIAGNOSTIC FINDINGS:  Osteopenia of multiple sites - 08/23/22: DEXA scan left femoral neck -1.9, no comparison.  No history of a stress fracture.   X-rays showed multilevel spondylosis, L4-5 spondylolisthesis and facet joint arthropathy.  PATIENT SURVEYS:  Modified Oswestry 5/50   COGNITIVE STATUS: Within functional limits for tasks assessed   SENSATION: WFL, patient reports new numbness R post lower leg, foot, thigh , intermittent in intensity  POSTURE:  Wide base of support, B hip  HAND DOMINANCE:  Right  GAIT: Distance walked: 64' in clinic  Assistive device utilized: None Level of assistance: Complete Independence Comments: wide base, arms mid guard several times, shortened stride length B Range of motion : L ankle pflexion, d flexion limited to 15 degrees each B hip flexion to 92  MMT:  R hip abduction 3-/5,  B hamstrings 3+/5 L ankle 4-/5  Functional tests: Berg 40/56 30 sec sit to stand: 9 reps Gait with horizontal head turns, mild deviation from path Gait with vertical head movements with mod dysfunction, frequent stops   TREATMENT:          DATE:  06/06/23 NuStep L5 x 3 min Resisted gait 30lb 4 forward/ backward x3  Resisted side steps 20lb x4  6in step ups form airex x10 each HS curls 20lb 2x10 Leg Ext 5lb 2x10 S2S on airex w/ yellow ball chest press 2x10  05/31/23:  Reassessed BERG Gait with horizontal and vertical head movements Nustep level 5, 6 min UE;s and LE's  B knee flexion 25# 2 x 12 B knee ext 10#, 2 x 12 In ll bars for lunges on compliant side of BOSU 15 x each, light UE support. In ll bars for red theraband around thighs, for penguins, for. Back rocking to engage hip stabilizers  05/26/23 Bike L 1.5 x 4 min NuStep L5 x 3 min Checked Goals MMT  Hip Add 4+/5  R HS 4/5  L HS 4/5 HS curls 25lb 2x12 Leg Ext 5lb 2x12 Alt 6in box taps from  airex 2x10 Side step on/off airex   05/24/23: Nustep Ue's and LE's 6 min, level 5 Seated B hamstring curls 20#, 3 x 12 Seated B long arc quads 5#, 3 x 12 Sit to stand from slightly elevated mat with feet on airex, onset 5  Then one set 5 with red mediball forward punches In ll bars for penguin side to side and for/back for proximal hip and spine stability, mass practice, green t band around thighs. In ll bars for staggered standing lead foot on 6" step with horizontal head turns 15  reps each Wall ladder climbs, ipsilateral arm and leg up wall, 15 x each side  Side stepping laterally on 8" step 12 x each side  Forward lunges in ll bars, lead foot on compliant side of BOSU, 15 x , CGA from PT  05/19/23: Therapeutic exercises: instructed in the following therex to improve her stability and LE strength, reduce fall risk: Recumbent bicycle, level 2.3 6 min Seated long arc quads, 5# cuff wts, 2 x12 Gait outdoors on sidewalk, and down /up curb, cues to step closer to curb for safety Sit to stand elevated mat , feet on airex 5 x 1, the 5 x with 2# mediball punches Resisted gait, 5 x F, B, side, 20# Hamstring curls 20#,  x12 In ll bars for penguin side to side and for/back for proximal hip and spine stability, mass practice, green t band around thighs. Forward step ups 6" step with rails 10 x each  05/17/23 Nustep L 5 6 min Resisted gait 20lb 4 way x 3 each 6in step ups x 10 each HS curls 20lb 2x12  Leg Ext 5lb 2x12 Stairs alt pattern 1 rail R 4 & 6 in stairs 20 steps  S2S LE on airex slightly elevated mat 2x10  05/11/23 Nustep L 5 STS on airex 3 sets 5, slightly elevated mat CGA 2# ankle wts HHA on airex alt LE 20 x marching , hip flex,ext and abd Ball toss on airex sup, good righting reaction and no dizziness Red tband hip flex,ext and abd in // bars 12 each Alt step tap 6 in 20 x 2 sets  HS curls 20lb 2x10  Leg Ext 5lb 2x10   05/10/23:Therapeutic exercise:  the patient was  instructed in the following ex designed to improve her vestibular function, stability for dynamic balance challenges, and gait, bulk LE strength:  Nustep level 5, 5 min UE's and LE's  Standing in corner, narrowed stance, with horizontal head turns 30 reps, able to perform well without balance loss In ll bars for forward heel taps from 2" step light UE support In ll bars for washcloth slides cw, ccw  Seated for long arc quads 3# each leg, 10 reps, 3 sets Standing in ll bars for penguins with red t band around thighs, and for/back rocking with theraband around thighs, to tolerance Standing on airex for alt toe taps to cone, to tolerance, in ll bars Resisted gait with 20#, lateral, for/back, 5 reps each  05/03/23 NuStep L5 x 6 min  Resisted gait forward and backward x3  S2S x10, then x10 LE on airex On airex alt 6in taps 2x10  Hs curls 20lb 2x10  Leg Ext 5lb 2x10 Seated rows red 2x0  PATIENT EDUCATION:  Education details: POC, goals Person educated: Patient Education method: Explanation, Demonstration, and Tactile cues Education comprehension: verbalized understanding and tactile cues required  HOME EXERCISE PROGRAM: Access Code: [email or web address] URL: [email or web address] Date: 05/01/2023 Prepared by: Linton Rump Speaks  Exercises - Gaze Stability (VOR) x 2 Feet Together With Horizontal Head Turns  - 1 x daily - 7 x weekly - 3 sets - 10 reps TBD   ASSESSMENT:  CLINICAL IMPRESSION: Patient is a 82 y.o. female who was participated in skilled physical therapy session today due to unsteady gait, with concurrent diagnosis of osteoporosis and chronic LBP. She continues to progress well overall. LE fatigue reported wit resisted gait. No pain during session. Cues for full ROM needed with leg curls and extension. Lob x 1 with step ups from  airex. OBJECTIVE IMPAIRMENTS: decreased activity tolerance, decreased balance, difficulty walking, decreased strength, and increased edema.   ACTIVITY  LIMITATIONS: carrying, lifting, bending, squatting, stairs, and locomotion level  PARTICIPATION LIMITATIONS: community activity, occupation, and yard work  PERSONAL FACTORS: Age, Behavior pattern, Past/current experiences, Time since onset of injury/illness/exacerbation, and 1-2 comorbidities: pulmonary hypertension, osteoporosis  are also affecting patient's functional outcome.   REHAB POTENTIAL: Good  CLINICAL DECISION MAKING: Stable/uncomplicated  EVALUATION COMPLEXITY: Low   GOALS: Goals reviewed with patient? Yes  SHORT TERM GOALS: Target date: 2 weeks, 05/03/23  I HEP Baseline: Goal status: 05/11/23 MET  LONG TERM GOALS: Target date: 06/14/23  Improve Berg from 40 to 45/56 Baseline:  Goal status: 45/56 Met 05/26/23 05/31/23: 51/56  2.  Improve gait and balance with patient able to walk 20' with horizontal and vertical head movements without deviation from path Baseline:  Goal status: INITIAL9/18/24:   3.  Improve R hip abduction strength to 4+/5 for better stability for gait  Baseline: 3-/5 Goal status: Met 05/26/23  4.  Improve B hamstrings strength to 4/5 for better control for reaching to floor Baseline: 3+/5 B Goal status: Met 05/26/23   PLAN:  PT FREQUENCY: 2x/week  PT DURATION: 8 weeks  PLANNED INTERVENTIONS: Therapeutic exercises, Therapeutic activity, Neuromuscular re-education, Balance training, Gait training, Patient/Family education, Self Care, and Joint mobilization.  PLAN FOR NEXT SESSION: progress with specific strengthening for R hip abductors, B hamstrings, add VOR ex in standing    Grayce Sessions, PTA,DPT, OCS

## 2023-06-07 ENCOUNTER — Encounter: Payer: Self-pay | Admitting: Podiatry

## 2023-06-07 ENCOUNTER — Ambulatory Visit (INDEPENDENT_AMBULATORY_CARE_PROVIDER_SITE_OTHER): Payer: Medicare Other

## 2023-06-07 ENCOUNTER — Ambulatory Visit: Payer: Medicare Other | Admitting: Podiatry

## 2023-06-07 DIAGNOSIS — M19079 Primary osteoarthritis, unspecified ankle and foot: Secondary | ICD-10-CM

## 2023-06-07 DIAGNOSIS — M778 Other enthesopathies, not elsewhere classified: Secondary | ICD-10-CM | POA: Diagnosis not present

## 2023-06-07 DIAGNOSIS — M5431 Sciatica, right side: Secondary | ICD-10-CM | POA: Diagnosis not present

## 2023-06-07 DIAGNOSIS — M79674 Pain in right toe(s): Secondary | ICD-10-CM | POA: Diagnosis not present

## 2023-06-07 DIAGNOSIS — B351 Tinea unguium: Secondary | ICD-10-CM

## 2023-06-07 DIAGNOSIS — M79675 Pain in left toe(s): Secondary | ICD-10-CM

## 2023-06-07 DIAGNOSIS — D2371 Other benign neoplasm of skin of right lower limb, including hip: Secondary | ICD-10-CM | POA: Diagnosis not present

## 2023-06-07 NOTE — Progress Notes (Signed)
Chief Complaint  Patient presents with   Foot Pain    Np- bilateral numbness and tingling/ corn on bottom of right foot/ toenails long and thick     SUBJECTIVE Patient presents to office today complaining of elongated, thickened nails that cause pain while ambulating in shoes.  Patient is unable to trim their own nails.  Patient also has developed a symptomatic skin lesion to the plantar aspect of the right foot.  She tries to trim it herself but is unable to reach her feet  Patient also states that over the last several months she has began noticing numbness with tingling sensation beginning in her right foot and extending all the way up to her right hip.  This has become a persistent sharp shooting electrical sensation.  She also says that a few weeks ago she felt as if her foot weighed "100 lbs".  Denies a history of injury.  Patient is here for further evaluation and treatment.  Past Medical History:  Diagnosis Date   Allergy    Anemia    in her 20's   Aortic insufficiency    i   Arthritis    not dx'd- just per pt    Asthma    AS A CHILD   Cancer (HCC)    endometrial cancer with hysterectomy in 1987   Cancer (HCC)    melanoma on face    Cataract    removed both eyes    DCM (dilated cardiomyopathy) (HCC)    EF 50-55%   GERD (gastroesophageal reflux disease)    occ has with diet    History of syncope    HTN (hypertension)    Hyperlipidemia    Mitral regurgitation    mild by echo 03/2023   OSA on CPAP    Pulmonary HTN (HCC)    PASP on echo 03/2023   TIA (transient ischemic attack)    Tricuspid regurgitation    moderate by echo 03/2023    Allergies  Allergen Reactions   Lovastatin Other (See Comments)    myalgias   Sudafed [Pseudoephedrine Hcl] Other (See Comments)    Hallucinations      OBJECTIVE General Patient is awake, alert, and oriented x 3 and in no acute distress. Derm Skin is dry and supple bilateral. Negative open lesions or macerations.  Remaining integument unremarkable. Nails are tender, long, thickened and dystrophic with subungual debris, consistent with onychomycosis, 1-5 bilateral. No signs of infection noted.  Symptomatic hyperkeratotic lesion with a central nucleated core also noted to the plantar aspect of the right forefoot Vasc  DP and PT pedal pulses palpable bilaterally. Temperature gradient within normal limits.  Neuro grossly intact via light touch Musculoskeletal Exam No symptomatic pedal deformities noted bilateral. Muscular strength within normal limits. Radiographic exam B/L feet 06/07/2023 advanced degenerative changes noted to the bilateral ankles.  Orthopedic hardware noted to the bilateral ankles.  No acute fractures identified  ASSESSMENT 1.  Pain due to onychomycosis of toenails both 2.  Eccrine poroma right 3.  Advanced DJD bilateral 4.  History of ORIF bilateral ankles  PLAN OF CARE -Patient evaluated today.  -Instructed to maintain good pedal hygiene and foot care.  -Mechanical debridement of nails 1-5 bilaterally performed using a nail nipper. Filed with dremel without incident.  -Excisional debridement of the hyperkeratotic tissue was performed to the right forefoot using a 3 Telle scalpel without incident or bleeding.  Salicylic acid applied.  Recommend OTC salicylic acid as needed -Advised against going  barefoot.  Recommend good supportive shoes and sneakers -Recommend follow-up with neuro spine or orthopedic to have her right hip and lumbar spine evaluated.  I do believe a lot of the patient's symptoms that she is explaining are related to more proximal pathology of the hip or lumbar spine -Return to clinic as needed    Felecia Shelling, DPM [facility]  Dr. Felecia Shelling, DPM    [address] [place], [name] [address]                Office [phone]  Fax [phone]

## 2023-06-12 NOTE — Telephone Encounter (Signed)
Pt was returning nurse Erica's call regarding results and is requesting a callback. Please advise

## 2023-06-12 NOTE — Telephone Encounter (Signed)
PFT results reviewed with patient.  Patient made aware referral has been placed and Dr Hunsucker's office would contact her to schedule appointment.

## 2023-06-13 ENCOUNTER — Encounter: Payer: Self-pay | Admitting: Physical Therapy

## 2023-06-13 ENCOUNTER — Encounter: Payer: Self-pay | Admitting: *Deleted

## 2023-06-13 ENCOUNTER — Ambulatory Visit: Payer: Medicare Other | Attending: Family Medicine | Admitting: Physical Therapy

## 2023-06-13 DIAGNOSIS — M8589 Other specified disorders of bone density and structure, multiple sites: Secondary | ICD-10-CM | POA: Insufficient documentation

## 2023-06-13 DIAGNOSIS — R2681 Unsteadiness on feet: Secondary | ICD-10-CM | POA: Insufficient documentation

## 2023-06-13 NOTE — Therapy (Addendum)
OUTPATIENT PHYSICAL THERAPY    Patient Name: Kerry [name] MRN: [id number] DOB:1941/06/08, 82 y.o., female Today's Date: 06/13/2023  END OF SESSION:  PT End of Session - 06/13/23 0758     Visit Number 12    Date for PT Re-Evaluation 06/14/23    PT Start Time 0800    PT Stop Time 0845    PT Time Calculation (min) 45 min    Activity Tolerance Patient tolerated treatment well    Behavior During Therapy [facility] for tasks assessed/performed                  Past Medical History:  Diagnosis Date   Allergy    Anemia    in her 20's   Aortic insufficiency    i   Arthritis    not dx'd- just per pt    Asthma    AS A CHILD   Cancer (HCC)    endometrial cancer with hysterectomy in 1987   Cancer (HCC)    melanoma on face    Cataract    removed both eyes    DCM (dilated cardiomyopathy) (HCC)    EF 50-55%   GERD (gastroesophageal reflux disease)    occ has with diet    History of syncope    HTN (hypertension)    Hyperlipidemia    Mitral regurgitation    mild by echo 03/2023   OSA on CPAP    Pulmonary HTN (HCC)    PASP on echo 03/2023   TIA (transient ischemic attack)    Tricuspid regurgitation    moderate by echo 03/2023   Past Surgical History:  Procedure Laterality Date   ABDOMINAL HYSTERECTOMY     ANKLE FRACTURE SURGERY Left    APPENDECTOMY     CATARACT EXTRACTION, BILATERAL     CHOLECYSTECTOMY     COLONOSCOPY  08/01/2007   Dr Marina Goodell    DENTAL SURGERY     MELANOMA EXCISION     face   ovary removal     right leg  fracture surgery      SKIN BIOPSY     pre-cancerous on face   Patient Active Problem List   Diagnosis Date Noted   Tricuspid regurgitation    Mitral regurgitation    Pulmonary HTN (HCC)    Prediabetes 03/15/2023   History of dehydration 03/15/2023   Osteopenia 09/14/2022   Low back pain with sciatica 08/19/2022   Gait instability 08/19/2022   Hospital discharge follow-up 09/28/2021   Traumatic ecchymosis of head 09/28/2021    Chronic post-traumatic headache, not intractable 09/28/2021   Subdural hematoma (HCC) 09/24/2021   Encephalopathy acute 09/24/2021   Normocytic anemia 09/14/2021   Seasonal allergic rhinitis due to pollen 03/11/2021   DCM (dilated cardiomyopathy) (HCC)    Hyponatremia 06/21/2019   TIA (transient ischemic attack) 06/20/2019   Aortic insufficiency 08/17/2018   Hyperlipidemia    Right thyroid nodule 02/07/2013   Right carotid bruit 07/16/2012   Ocular migraine 11/04/2011   Hypercholesterolemia 06/01/2011   Essential hypertension    History of syncope    Asthma     PCP: Nadene Rubins, MD  REFERRING PROVIDER: Pollyann Savoy, MD  REFERRING DIAG: unsteady gait, osteopenia  Rationale for Evaluation and Treatment: Rehabilitation  THERAPY DIAG:  Osteopenia of multiple sites  Unstable gait  ONSET DATE: January 2024   SUBJECTIVE:  SUBJECTIVE STATEMENT: Stomach is a little queasy today. Back pain yesterday form picking up twigs after the storm  PERTINENT HISTORY:  H/o osteoporosis with overall height loss of 3 ",  referred by rheumatologist due to unsteady gait  PAIN:  Are you having pain? Yes: NPRS scale: 3/10 Pain location: back of R leg Pain description: hurts with specific bending and walking Aggravating factors: above Relieving factors: sitting  PRECAUTIONS:  Fall and Other: osteoporosis  RED FLAGS: Compression fracture: Yes: possible old remote lumbar compression fx    WEIGHT BEARING RESTRICTIONS:  No  FALLS:  Has patient fallen in last 6 months? Yes. Number of falls 1  LIVING ENVIRONMENT: Lives with: lives alone Lives in: House/apartment Stairs: No Has following equipment at home: Single point cane  OCCUPATION:  Work in retail 5 hrs in dressing room  PLOF:   Independent  PATIENT GOALS:  To avoid falling  NEXT MD VISIT: 2 months   OBJECTIVE:   DIAGNOSTIC FINDINGS:  Osteopenia of multiple sites - 08/23/22: DEXA scan left femoral neck -1.9, no comparison.  No history of a stress fracture.   X-rays showed multilevel spondylosis, L4-5 spondylolisthesis and facet joint arthropathy.  PATIENT SURVEYS:  Modified Oswestry 5/50   COGNITIVE STATUS: Within functional limits for tasks assessed   SENSATION: WFL, patient reports new numbness R post lower leg, foot, thigh , intermittent in intensity  POSTURE:  Wide base of support, B hip  HAND DOMINANCE:  Right  GAIT: Distance walked: 59' in clinic  Assistive device utilized: None Level of assistance: Complete Independence Comments: wide base, arms mid guard several times, shortened stride length B Range of motion : L ankle pflexion, d flexion limited to 15 degrees each B hip flexion to 92  MMT:  R hip abduction 3-/5,  B hamstrings 3+/5 L ankle 4-/5  Functional tests: Berg 40/56 30 sec sit to stand: 9 reps Gait with horizontal head turns, mild deviation from path Gait with vertical head movements with mod dysfunction, frequent stops   TREATMENT:          DATE:  06/13/23 NuStep L5 x 3 min HS curls 25lb 2x10 Leg Ext 10lb 2x10 Shoulder Ext 5lb 2x10 Seated Rows & Lats 2lb 2x10 S2S w/ yellow wball chest press   06/06/23 NuStep L5 x 3 min Resisted gait 30lb 4 forward/ backward x3  Resisted side steps 20lb x4  6in step ups form airex x10 each HS curls 20lb 2x10 Leg Ext 5lb 2x10 S2S on airex w/ yellow ball chest press 2x10  05/31/23:  Reassessed BERG Gait with horizontal and vertical head movements Nustep level 5, 6 min UE;s and LE's  B knee flexion 25# 2 x 12 B knee ext 10#, 2 x 12 In ll bars for lunges on compliant side of BOSU 15 x each, light UE support. In ll bars for red theraband around thighs, for penguins, for. Back rocking to engage hip  stabilizers  05/26/23 Bike L 1.5 x 4 min NuStep L5 x 3 min Checked Goals MMT  Hip Add 4+/5  R HS 4/5  L HS 4/5 HS curls 25lb 2x12 Leg Ext 5lb 2x12 Alt 6in box taps from airex 2x10 Side step on/off airex   05/24/23: Nustep Ue's and LE's 6 min, level 5 Seated B hamstring curls 20#, 3 x 12 Seated B long arc quads 5#, 3 x 12 Sit to stand from slightly elevated mat with feet on airex, onset 5  Then one set 5 with red mediball forward  punches In ll bars for penguin side to side and for/back for proximal hip and spine stability, mass practice, green t band around thighs. In ll bars for staggered standing lead foot on 6" step with horizontal head turns 15 reps each Wall ladder climbs, ipsilateral arm and leg up wall, 15 x each side  Side stepping laterally on 8" step 12 x each side  Forward lunges in ll bars, lead foot on compliant side of BOSU, 15 x , CGA from PT  05/19/23: Therapeutic exercises: instructed in the following therex to improve her stability and LE strength, reduce fall risk: Recumbent bicycle, level 2.3 6 min Seated long arc quads, 5# cuff wts, 2 x12 Gait outdoors on sidewalk, and down /up curb, cues to step closer to curb for safety Sit to stand elevated mat , feet on airex 5 x 1, the 5 x with 2# mediball punches Resisted gait, 5 x F, B, side, 20# Hamstring curls 20#,  x12 In ll bars for penguin side to side and for/back for proximal hip and spine stability, mass practice, green t band around thighs. Forward step ups 6" step with rails 10 x each  05/17/23 Nustep L 5 6 min Resisted gait 20lb 4 way x 3 each 6in step ups x 10 each HS curls 20lb 2x12  Leg Ext 5lb 2x12 Stairs alt pattern 1 rail R 4 & 6 in stairs 20 steps  S2S LE on airex slightly elevated mat 2x10  05/11/23 Nustep L 5 STS on airex 3 sets 5, slightly elevated mat CGA 2# ankle wts HHA on airex alt LE 20 x marching , hip flex,ext and abd Ball toss on airex sup, good righting reaction and no  dizziness Red tband hip flex,ext and abd in // bars 12 each Alt step tap 6 in 20 x 2 sets  HS curls 20lb 2x10  Leg Ext 5lb 2x10   05/10/23:Therapeutic exercise:  the patient was instructed in the following ex designed to improve her vestibular function, stability for dynamic balance challenges, and gait, bulk LE strength:  Nustep level 5, 5 min UE's and LE's  Standing in corner, narrowed stance, with horizontal head turns 30 reps, able to perform well without balance loss In ll bars for forward heel taps from 2" step light UE support In ll bars for washcloth slides cw, ccw  Seated for long arc quads 3# each leg, 10 reps, 3 sets Standing in ll bars for penguins with red t band around thighs, and for/back rocking with theraband around thighs, to tolerance Standing on airex for alt toe taps to cone, to tolerance, in ll bars Resisted gait with 20#, lateral, for/back, 5 reps each  05/03/23 NuStep L5 x 6 min  Resisted gait forward and backward x3  S2S x10, then x10 LE on airex On airex alt 6in taps 2x10  Hs curls 20lb 2x10  Leg Ext 5lb 2x10 Seated rows red 2x0  PATIENT EDUCATION:  Education details: POC, goals Person educated: Patient Education method: Explanation, Demonstration, and Tactile cues Education comprehension: verbalized understanding and tactile cues required  HOME EXERCISE PROGRAM: Access Code: [email or web address] URL: [email or web address] Date: 05/01/2023 Prepared by: Linton Rump Speaks  Exercises - Gaze Stability (VOR) x 2 Feet Together With Horizontal Head Turns  - 1 x daily - 7 x weekly - 3 sets - 10 reps TBD   ASSESSMENT:  CLINICAL IMPRESSION: Patient is a 82 y.o. female who was participated in skilled physical therapy session today due to unsteady gait, with  concurrent diagnosis of osteoporosis and chronic LBP. She has progressed meeting all goals. All interventions completed well Some instability with side steps on airex but pt able to maintain balance on her  own.    OBJECTIVE IMPAIRMENTS: decreased activity tolerance, decreased balance, difficulty walking, decreased strength, and increased edema.   ACTIVITY LIMITATIONS: carrying, lifting, bending, squatting, stairs, and locomotion level  PARTICIPATION LIMITATIONS: community activity, occupation, and yard work  PERSONAL FACTORS: Age, Behavior pattern, Past/current experiences, Time since onset of injury/illness/exacerbation, and 1-2 comorbidities: pulmonary hypertension, osteoporosis  are also affecting patient's functional outcome.   REHAB POTENTIAL: Good  CLINICAL DECISION MAKING: Stable/uncomplicated  EVALUATION COMPLEXITY: Low   GOALS: Goals reviewed with patient? Yes  SHORT TERM GOALS: Target date: 2 weeks, 05/03/23  I HEP Baseline: Goal status: 05/11/23 MET  LONG TERM GOALS: Target date: 06/14/23  Improve Berg from 40 to 45/56 Baseline:  Goal status: 45/56 Met 05/26/23 05/31/23: 51/56  2.  Improve gait and balance with patient able to walk 20' with horizontal and vertical head movements without deviation from path Baseline:  Goal status:  met 06/13/23   3.  Improve R hip abduction strength to 4+/5 for better stability for gait  Baseline: 3-/5 Goal status: Met 05/26/23  4.  Improve B hamstrings strength to 4/5 for better control for reaching to floor Baseline: 3+/5 B Goal status: Met 05/26/23   PLAN:  PT FREQUENCY: 2x/week  PT DURATION: 8 weeks  PLANNED INTERVENTIONS: Therapeutic exercises, Therapeutic activity, Neuromuscular re-education, Balance training, Gait training, Patient/Family education, Self Care, and Joint mobilization.  PLAN FOR NEXT SESSION: progress with specific strengthening for R hip abductors, B hamstrings, add VOR ex in standing   PHYSICAL THERAPY DISCHARGE SUMMARY  Visits from Start of Care: 12  Patient agrees to discharge. Patient goals were met. Patient is being discharged due to meeting the stated rehab goals.  Cassie Freer, DPT Grayce Sessions, PTA

## 2023-06-15 ENCOUNTER — Ambulatory Visit: Payer: Medicare Other | Admitting: Family Medicine

## 2023-06-15 ENCOUNTER — Encounter: Payer: Self-pay | Admitting: Family Medicine

## 2023-06-15 VITALS — BP 123/44 | HR 67 | Wt 190.4 lb

## 2023-06-15 DIAGNOSIS — M5441 Lumbago with sciatica, right side: Secondary | ICD-10-CM

## 2023-06-15 DIAGNOSIS — R942 Abnormal results of pulmonary function studies: Secondary | ICD-10-CM | POA: Diagnosis not present

## 2023-06-15 DIAGNOSIS — R7303 Prediabetes: Secondary | ICD-10-CM | POA: Diagnosis not present

## 2023-06-15 LAB — HEMOGLOBIN A1C: Hgb A1c MFr Bld: 5.9 % (ref 4.6–6.5)

## 2023-06-15 NOTE — Progress Notes (Signed)
Established Patient Office Visit   Subjective:  Patient ID: Kerry [name], female    DOB: 02/17/1941  Age: 82 y.o. MRN: [id number]  Chief Complaint  Patient presents with   Medical Management of Chronic Issues    Pt states she is having lower back pain as well as in the back of her R leg. Both issues have been ongoing for a while.     HPI Encounter Diagnoses  Name Primary?   Right-sided low back pain with right-sided sciatica, unspecified chronicity Yes   Prediabetes    Abnormal PFT    Follow-up of prediabetes.  23-month history of intermittent lower back pain on the right greater than left side.  There is sometimes movement of the pain down through the back of the leg to the heel.  Pain sometimes moves up the leg into the buttock as well.  Denies provocative or alleviating factors.  There are no saddle paresthesias.  Denies weakness in her lower extremities.  There is tingling sometimes in the right leg and foot.  No bowel or bladder incontinence.  She just finished physical therapy for gait stabilization.  Recent abnormal PFT showed decreased diffusion capacity.  Appointment with pulmonology is pending.   Review of Systems  Constitutional: Negative.   HENT: Negative.    Eyes:  Negative for blurred vision, discharge and redness.  Respiratory: Negative.    Cardiovascular: Negative.   Gastrointestinal:  Negative for abdominal pain.  Genitourinary: Negative.   Musculoskeletal:  Positive for back pain. Negative for myalgias.  Skin:  Negative for rash.  Neurological:  Negative for tingling, loss of consciousness and weakness.  Endo/Heme/Allergies:  Negative for polydipsia.     Current Outpatient Medications:    acetaminophen (TYLENOL) 500 MG tablet, Take 500 mg by mouth every 6 (six) hours as needed for moderate pain or headache. , Disp: , Rfl:    alendronate (FOSAMAX) 70 MG tablet, Take 1 tablet (70 mg total) by mouth once a week. Take with a full glass of water on an empty  stomach., Disp: 12 tablet, Rfl: 1   Ascorbic Acid (VITAMIN C PO), Take 1 tablet by mouth daily., Disp: , Rfl:    aspirin 81 MG chewable tablet, Chew 81 mg by mouth every other day., Disp: , Rfl:    atorvastatin (LIPITOR) 20 MG tablet, TAKE 1 TABLET(20 MG) BY MOUTH DAILY, Disp: 90 tablet, Rfl: 2   carvedilol (COREG) 12.5 MG tablet, TAKE 1 TABLET(12.5 MG) BY MOUTH TWICE DAILY. KEEP APPOINTMENT FOR FURTHER REFILLS, Disp: 180 tablet, Rfl: 3   Cholecalciferol (VITAMIN D3) 2000 units capsule, Take 2,000 Units by mouth daily., Disp: , Rfl:    Coenzyme Q10-Vitamin E (QUNOL ULTRA COQ10 PO), Take 1 capsule by mouth daily., Disp: , Rfl:    Cyanocobalamin 500 MCG CHEW, Take one daily, Disp: 90 tablet, Rfl: 1   fexofenadine (ALLEGRA) 180 MG tablet, Take 180 mg by mouth every morning., Disp: , Rfl:    hydrochlorothiazide (HYDRODIURIL) 25 MG tablet, Take 1 tablet (25 mg total) by mouth daily., Disp: 90 tablet, Rfl: 3   lisinopril (ZESTRIL) 20 MG tablet, Take 1 tablet (20 mg total) by mouth daily., Disp: 90 tablet, Rfl: 3   Misc Natural Products (NEURIVA PO), Take 1 tablet by mouth daily., Disp: , Rfl:    Multiple Vitamin (MULTIVITAMIN WITH MINERALS) TABS tablet, Take 1 tablet by mouth daily., Disp: , Rfl:    Polyethyl Glycol-Propyl Glycol (SYSTANE) 0.4-0.3 % SOLN, Place 1 drop into both eyes daily  as needed (dry eyes)., Disp: , Rfl:    Objective:     BP (!) 123/44   Pulse 67   Wt 190 lb 6.4 oz (86.4 kg)   SpO2 97%   BMI 33.73 kg/m  BP Readings from Last 3 Encounters:  06/15/23 (!) 123/44  04/28/23 136/62  04/11/23 120/69   Wt Readings from Last 3 Encounters:  06/15/23 190 lb 6.4 oz (86.4 kg)  04/28/23 188 lb 12.8 oz (85.6 kg)  04/11/23 191 lb 12.8 oz (87 kg)      Physical Exam Constitutional:      General: She is not in acute distress.    Appearance: Normal appearance. She is not ill-appearing, toxic-appearing or diaphoretic.  HENT:     Head: Normocephalic and atraumatic.     Right Ear:  External ear normal.     Left Ear: External ear normal.  Eyes:     General: No scleral icterus.       Right eye: No discharge.        Left eye: No discharge.     Extraocular Movements: Extraocular movements intact.     Conjunctiva/sclera: Conjunctivae normal.  Cardiovascular:     Rate and Rhythm: Normal rate and regular rhythm.  Pulmonary:     Effort: Pulmonary effort is normal. No respiratory distress.     Breath sounds: No wheezing or rhonchi.  Musculoskeletal:     Lumbar back: No tenderness or bony tenderness. Normal range of motion. Negative right straight leg raise test and negative left straight leg raise test.     Right hip: Normal range of motion. Normal strength.     Left hip: Normal range of motion. Normal strength.  Skin:    General: Skin is warm and dry.  Neurological:     Mental Status: She is alert and oriented to person, place, and time.     Motor: No weakness.     Deep Tendon Reflexes:     Reflex Scores:      Patellar reflexes are 1+ on the right side and 1+ on the left side.      Achilles reflexes are 1+ on the right side and 1+ on the left side. Psychiatric:        Mood and Affect: Mood normal.        Behavior: Behavior normal.      No results found for any visits on 06/15/23.  Last metabolic panel Lab Results  Component Value Date   GLUCOSE 91 04/28/2023   NA 138 04/28/2023   K 5.0 04/28/2023   CL 103 04/28/2023   CO2 22 04/28/2023   BUN 28 (H) 04/28/2023   CREATININE 0.84 04/28/2023   GFR 71.29 03/15/2023   CALCIUM 9.9 04/28/2023   PHOS 4.0 03/21/2023   PROT 7.2 03/21/2023   ALBUMIN 4.0 03/15/2023   LABGLOB 3.0 07/10/2020   AGRATIO 1.4 07/10/2020   BILITOT 0.8 03/15/2023   ALKPHOS 98 03/15/2023   AST 21 03/15/2023   ALT 16 03/15/2023   ANIONGAP 8 03/08/2023      The ASCVD Risk score (Arnett DK, et al., 2019) failed to calculate for the following reasons:   The 2019 ASCVD risk score is only valid for ages 21 to 24   The patient has a  prior MI or stroke diagnosis    Assessment & Plan:   Right-sided low back pain with right-sided sciatica, unspecified chronicity -     Ambulatory referral to Physical Therapy  Prediabetes -  Hemoglobin A1c  Abnormal PFT    Return in about 3 months (around 09/19/2023).  Information was given on prediabetes.  Appointment with pulmonology for decreased diffusion capacity is pending.  Physical therapy for ongoing lower back pain with sciatica on the right side.  Mliss Sax, MD

## 2023-06-19 ENCOUNTER — Ambulatory Visit (INDEPENDENT_AMBULATORY_CARE_PROVIDER_SITE_OTHER): Payer: Medicare Other

## 2023-06-19 VITALS — BP 118/58 | HR 72 | Temp 98.5°F | Ht 63.0 in | Wt 189.6 lb

## 2023-06-19 DIAGNOSIS — Z0000 Encounter for general adult medical examination without abnormal findings: Secondary | ICD-10-CM | POA: Diagnosis not present

## 2023-06-19 NOTE — Progress Notes (Signed)
Subjective:   Kerry [name] is a 83 y.o. female who presents for Medicare Annual (Subsequent) preventive examination.  Visit Complete: In person    Cardiac Risk Factors include: advanced age (>38men, >28 women);dyslipidemia;hypertension;obesity (BMI >30kg/m2)     Objective:    Today's Vitals   06/19/23 0806  BP: (!) 118/58  Pulse: 72  Temp: 98.5 F (36.9 C)  TempSrc: Oral  SpO2: 96%  Weight: 189 lb 9.6 oz (86 kg)  Height: 5\' 3"  (1.6 m)   Body mass index is 33.59 kg/m.     06/19/2023    8:13 AM 03/08/2023    9:57 PM 06/14/2022    8:57 AM 09/24/2021    6:52 PM 06/05/2021   10:04 AM 08/10/2020    8:41 AM 06/20/2019    6:56 PM  Advanced Directives  Does Patient Have a Medical Advance Directive? No No No No No No No  Would patient like information on creating a medical advance directive? No - Patient declined No - Patient declined No - Patient declined No - Patient declined No - Patient declined No - Patient declined     Current Medications (verified) Outpatient Encounter Medications as of 06/19/2023  Medication Sig   acetaminophen (TYLENOL) 500 MG tablet Take 500 mg by mouth every 6 (six) hours as needed for moderate pain or headache.    alendronate (FOSAMAX) 70 MG tablet Take 1 tablet (70 mg total) by mouth once a week. Take with a full glass of water on an empty stomach.   Ascorbic Acid (VITAMIN C PO) Take 1 tablet by mouth daily.   aspirin 81 MG chewable tablet Chew 81 mg by mouth every other day.   atorvastatin (LIPITOR) 20 MG tablet TAKE 1 TABLET(20 MG) BY MOUTH DAILY   carvedilol (COREG) 12.5 MG tablet TAKE 1 TABLET(12.5 MG) BY MOUTH TWICE DAILY. KEEP APPOINTMENT FOR FURTHER REFILLS   Cholecalciferol (VITAMIN D3) 2000 units capsule Take 2,000 Units by mouth daily.   Coenzyme Q10-Vitamin E (QUNOL ULTRA COQ10 PO) Take 1 capsule by mouth daily.   Cyanocobalamin 500 MCG CHEW Take one daily   fexofenadine (ALLEGRA) 180 MG tablet Take 180 mg by mouth every morning.    hydrochlorothiazide (HYDRODIURIL) 25 MG tablet Take 1 tablet (25 mg total) by mouth daily.   lisinopril (ZESTRIL) 20 MG tablet Take 1 tablet (20 mg total) by mouth daily.   Misc Natural Products (NEURIVA PO) Take 1 tablet by mouth daily.   Multiple Vitamin (MULTIVITAMIN WITH MINERALS) TABS tablet Take 1 tablet by mouth daily.   Polyethyl Glycol-Propyl Glycol (SYSTANE) 0.4-0.3 % SOLN Place 1 drop into both eyes daily as needed (dry eyes).   No facility-administered encounter medications on file as of 06/19/2023.    Allergies (verified) Lovastatin and Sudafed [pseudoephedrine hcl]   History: Past Medical History:  Diagnosis Date   Allergy    Anemia    in her 20's   Aortic insufficiency    i   Arthritis    not dx'd- just per pt    Asthma    AS A CHILD   Cancer (HCC)    endometrial cancer with hysterectomy in 1987   Cancer (HCC)    melanoma on face    Cataract    removed both eyes    DCM (dilated cardiomyopathy) (HCC)    EF 50-55%   GERD (gastroesophageal reflux disease)    occ has with diet    History of syncope    HTN (hypertension)  Hyperlipidemia    Mitral regurgitation    mild by echo 03/2023   OSA on CPAP    Pulmonary HTN (HCC)    PASP on echo 03/2023   TIA (transient ischemic attack)    Tricuspid regurgitation    moderate by echo 03/2023   Past Surgical History:  Procedure Laterality Date   ABDOMINAL HYSTERECTOMY     ANKLE FRACTURE SURGERY Left    APPENDECTOMY     CATARACT EXTRACTION, BILATERAL     CHOLECYSTECTOMY     COLONOSCOPY  08/01/2007   Dr Marina Goodell    DENTAL SURGERY     MELANOMA EXCISION     face   ovary removal     right leg  fracture surgery      SKIN BIOPSY     pre-cancerous on face   Family History  Problem Relation Age of Onset   Heart disease Mother    Kidney failure Mother    Diabetes Mother    Hyperlipidemia Mother    Hypertension Mother    Uterine cancer Mother    Dementia Father    Diabetes Sister    Multiple sclerosis  Sister    Stomach cancer Sister    Dementia Sister    Throat cancer Brother        smoker   Diabetes Paternal Grandmother    Mitral valve prolapse Daughter    Colon cancer Neg Hx    Colon polyps Neg Hx    Esophageal cancer Neg Hx    Rectal cancer Neg Hx    Social History   Socioeconomic History   Marital status: Widowed    Spouse name: Not on file   Number of children: 2   Years of education: Not on file   Highest education level: Not on file  Occupational History   Not on file  Tobacco Use   Smoking status: Never    Passive exposure: Past   Smokeless tobacco: Never  Vaping Use   Vaping status: Never Used  Substance and Sexual Activity   Alcohol use: No   Drug use: No   Sexual activity: Not Currently  Other Topics Concern   Not on file  Social History Narrative   Lives at home alone   1 cup of coffee per day    Works at [employer]   Financial Resource Strain: Low Risk  (06/19/2023)   Overall Financial Resource Strain (CARDIA)    Difficulty of Paying Living Expenses: Not hard at all  Food Insecurity: No Food Insecurity (06/19/2023)   Hunger Vital Sign    Worried About Running Out of Food in the Last Year: Never true    Ran Out of Food in the Last Year: Never true  Transportation Needs: No Transportation Needs (06/19/2023)   PRAPARE - Administrator, Civil Service (Medical): No    Lack of Transportation (Non-Medical): No  Physical Activity: Insufficiently Active (06/19/2023)   Exercise Vital Sign    Days of Exercise per Week: 7 days    Minutes of Exercise per Session: 10 min  Stress: No Stress Concern Present (06/19/2023)   [employer] of Occupational Health - Occupational Stress Questionnaire    Feeling of Stress : Not at all  Social Connections: Moderately Isolated (06/19/2023)   Social Connection and Isolation Panel [NHANES]    Frequency of Communication with Friends and Family: Twice a week    Frequency  of Social Gatherings with Friends and  Family: More than three times a week    Attends Religious Services: More than 4 times per year    Active Member of Clubs or Organizations: No    Attends Banker Meetings: Never    Marital Status: Widowed    Tobacco Counseling Counseling given: Not Answered   Clinical Intake:  Pre-visit preparation completed: Yes  Pain : No/denies pain     Nutritional Status: BMI > 30  Obese Nutritional Risks: Nausea/ vomitting/ diarrhea (a little diarrhea this morning due to what she ate) Diabetes: No  How often do you need to have someone help you when you read instructions, pamphlets, or other written materials from your doctor or pharmacy?: 1 - Never  Interpreter Needed?: No  Information entered by :: NAllen LPN   Activities of Daily Living    06/19/2023    8:08 AM  In your present state of health, do you have any difficulty performing the following activities:  Hearing? 0  Vision? 0  Difficulty concentrating or making decisions? 1  Comment a little  Walking or climbing stairs? 0  Dressing or bathing? 0  Doing errands, shopping? 0  Preparing Food and eating ? N  Using the Toilet? N  In the past six months, have you accidently leaked urine? N  Do you have problems with loss of bowel control? N  Managing your Medications? N  Managing your Finances? N  Housekeeping or managing your Housekeeping? N    Patient Care Team: Mliss Sax, MD as PCP - General (Family Medicine) Richardean Chimera, MD as Consulting Physician (Obstetrics and Gynecology) Quintella Reichert, MD as Consulting Physician (Cardiology) [facility]  Indicate any recent Medical Services you may have received from other than [facility] providers in the past year (date may be approximate).     Assessment:   This is a routine wellness examination for [place].  Hearing/Vision screen Hearing Screening - Comments:: Denies hearing issues Vision  Screening - Comments:: Regular eye exams, [facility]   Goals Addressed             This Visit's Progress    Patient Stated       06/19/2023, work on pain in legs and back       Depression Screen    06/19/2023    8:15 AM 04/28/2023    3:05 PM 09/14/2022    8:17 AM 08/12/2022   11:32 AM 06/14/2022    8:56 AM 09/28/2021    2:32 PM 06/05/2021   10:02 AM  PHQ 2/9 Scores  PHQ - 2 Score 0 0 0 0 0 0 2  PHQ- 9 Score 0      4    Fall Risk    06/19/2023    8:15 AM 04/28/2023    3:05 PM 03/15/2023    8:06 AM 09/14/2022    8:17 AM 08/12/2022   11:32 AM  Fall Risk   Falls in the past year? 0 0 0 0 1  Number falls in past yr: 0 0 0 0 1  Injury with Fall? 0 0 0 0 1  Risk for fall due to : Medication side effect No Fall Risks History of fall(s) No Fall Risks No Fall Risks  Follow up Falls prevention discussed;Falls evaluation completed Falls prevention discussed Falls evaluation completed Falls evaluation completed Falls evaluation completed    MEDICARE RISK AT HOME: Medicare Risk at Home Any stairs in or around the home?: No If so, are there any  without handrails?: No Home free of loose throw rugs in walkways, pet beds, electrical cords, etc?: Yes Adequate lighting in your home to reduce risk of falls?: Yes Life alert?: No Use of a cane, walker or w/c?: No Grab bars in the bathroom?: Yes Shower chair or bench in shower?: No Elevated toilet seat or a handicapped toilet?: No  TIMED UP AND GO:  Was the test performed?  Yes  Length of time to ambulate 10 feet: 5 sec Gait slow and steady without use of assistive device    Cognitive Function:        06/19/2023    8:17 AM 06/14/2022    8:58 AM 06/05/2021   10:07 AM  6CIT Screen  What Year? 0 points 0 points 0 points  What month? 0 points 0 points 0 points  What time? 0 points 0 points 0 points  Count back from 20 0 points 0 points 0 points  Months in reverse 0 points 0 points 0 points  Repeat phrase 2 points 0 points 0 points   Total Score 2 points 0 points 0 points    Immunizations Immunization History  Administered Date(s) Administered   Fluad Quad(high Dose 65+) 05/31/2022   Influenza Split 06/19/2014, 06/05/2017   Influenza, High Dose Seasonal PF 07/16/2018   Influenza-Unspecified 05/31/2015, 06/13/2016, 06/23/2020, 07/04/2021   Moderna Sars-Covid-2 Vaccination 08/01/2020   PFIZER(Purple Top)SARS-COV-2 Vaccination 11/06/2019, 11/27/2019   Pneumococcal Conjugate-13 04/14/2017   Pneumococcal Polysaccharide-23 06/28/2009   Tdap 01/06/2023    TDAP status: Up to date  Flu Vaccine status: Declined, Education has been provided regarding the importance of this vaccine but patient still declined. Advised may receive this vaccine at local pharmacy or Health Dept. Aware to provide a copy of the vaccination record if obtained from local pharmacy or Health Dept. Verbalized acceptance and understanding.  Pneumococcal vaccine status: Up to date  Covid-19 vaccine status: Information provided on how to obtain vaccines.   Qualifies for Shingles Vaccine? Yes   Zostavax completed No   Shingrix Completed?: No.    Education has been provided regarding the importance of this vaccine. Patient has been advised to call insurance company to determine out of pocket expense if they have not yet received this vaccine. Advised may also receive vaccine at local pharmacy or Health Dept. Verbalized acceptance and understanding.  Screening Tests Health Maintenance  Topic Date Due   Zoster Vaccines- Shingrix (1 of 2) Never done   INFLUENZA VACCINE  12/11/2023 (Originally 04/13/2023)   Medicare Annual Wellness (AWV)  06/18/2024   Pneumonia Vaccine 59+ Years old  Completed   DEXA SCAN  Completed   HPV VACCINES  Aged Out   DTaP/Tdap/Td  Discontinued   COVID-19 Vaccine  Discontinued    Health Maintenance  Health Maintenance Due  Topic Date Due   Zoster Vaccines- Shingrix (1 of 2) Never done    Colorectal cancer screening: No  longer required.   Mammogram status: No longer required due to age.  Bone Density status: Completed 08/23/2022.   Lung Cancer Screening: (Low Dose CT Chest recommended if Age 6-80 years, 20 pack-year currently smoking OR have quit w/in 15years.) does not qualify.   Lung Cancer Screening Referral: no  Additional Screening:  Hepatitis C Screening: does not qualify;   Vision Screening: Recommended annual ophthalmology exams for early detection of glaucoma and other disorders of the eye. Is the patient up to date with their annual eye exam?  Yes  Who is the provider or what is the  name of the office in which the patient attends annual eye exams? [facility] If pt is not established with a provider, would they like to be referred to a provider to establish care? No .   Dental Screening: Recommended annual dental exams for proper oral hygiene  Diabetic Foot Exam: n/a  Community Resource Referral / Chronic Care Management: CRR required this visit?  No   CCM required this visit?  No     Plan:     I have personally reviewed and noted the following in the patient's chart:   Medical and social history Use of alcohol, tobacco or illicit drugs  Current medications and supplements including opioid prescriptions. Patient is not currently taking opioid prescriptions. Functional ability and status Nutritional status Physical activity Advanced directives List of other physicians Hospitalizations, surgeries, and ER visits in previous 12 months Vitals Screenings to include cognitive, depression, and falls Referrals and appointments  In addition, I have reviewed and discussed with patient certain preventive protocols, quality metrics, and best practice recommendations. A written personalized care plan for preventive services as well as general preventive health recommendations were provided to patient.     Barb Merino, LPN   16/09/958   After Visit Summary: (In  Person-Printed) AVS printed and given to the patient  Nurse Notes: none

## 2023-06-19 NOTE — Patient Instructions (Signed)
Ms. Ballowe , Thank you for taking time to come for your Medicare Wellness Visit. I appreciate your ongoing commitment to your health goals. Please review the following plan we discussed and let me know if I can assist you in the future.   Referrals/Orders/Follow-Ups/Clinician Recommendations: none  This is a list of the screening recommended for you and due dates:  Health Maintenance  Topic Date Due   Zoster (Shingles) Vaccine (1 of 2) Never done   Flu Shot  12/11/2023*   Medicare Annual Wellness Visit  06/18/2024   Pneumonia Vaccine  Completed   DEXA scan (bone density measurement)  Completed   HPV Vaccine  Aged Out   DTaP/Tdap/Td vaccine  Discontinued   COVID-19 Vaccine  Discontinued  *Topic was postponed. The date shown is not the original due date.    Advanced directives: (Declined) Advance directive discussed with you today. Even though you declined this today, please call our office should you change your mind, and we can give you the proper paperwork for you to fill out.  Next Medicare Annual Wellness Visit scheduled for next year: Yes  Insert Preventive Care attachment Insert FALL PREVENTION attachment if needed

## 2023-06-27 DIAGNOSIS — L578 Other skin changes due to chronic exposure to nonionizing radiation: Secondary | ICD-10-CM | POA: Diagnosis not present

## 2023-06-27 DIAGNOSIS — D225 Melanocytic nevi of trunk: Secondary | ICD-10-CM | POA: Diagnosis not present

## 2023-06-27 DIAGNOSIS — D485 Neoplasm of uncertain behavior of skin: Secondary | ICD-10-CM | POA: Diagnosis not present

## 2023-06-27 DIAGNOSIS — L565 Disseminated superficial actinic porokeratosis (DSAP): Secondary | ICD-10-CM | POA: Diagnosis not present

## 2023-06-27 DIAGNOSIS — L570 Actinic keratosis: Secondary | ICD-10-CM | POA: Diagnosis not present

## 2023-06-27 DIAGNOSIS — D0439 Carcinoma in situ of skin of other parts of face: Secondary | ICD-10-CM | POA: Diagnosis not present

## 2023-06-27 DIAGNOSIS — L219 Seborrheic dermatitis, unspecified: Secondary | ICD-10-CM | POA: Diagnosis not present

## 2023-06-28 NOTE — Progress Notes (Signed)
Office Visit Note  Patient: Kerry [name]             Date of Birth: December 20, 1940           MRN: [id number]             PCP: Mliss Sax, MD Referring: Mliss Sax,* Visit Date: 07/12/2023 Occupation: @GUAROCC @  Subjective:  Medication management   History of Present Illness: [name] is a 82 y.o. female with osteoarthritis, degenerative disc disease and osteopenia.  She was a started on Fosamax on July 30 04/2019 for which she has been tolerating well.  She also takes calcium and vitamin D.  She is still working part-time and also exercising some.  She states recently she had a spasm in her right foot and her feet felt heavy but then the symptoms resolved.  She still gets some tingling and muscle spasms in her legs.  She went to physical therapy for unstable gait.  She states she finished the physical therapy.  She was having some lower back pain which has gradually improved.  Does not have much discomfort in her hands now.    Activities of Daily Living:  Patient reports morning stiffness for 0 minute.   Patient Denies nocturnal pain.  Difficulty dressing/grooming: Denies Difficulty climbing stairs: Denies Difficulty getting out of chair: Denies Difficulty using hands for taps, buttons, cutlery, and/or writing: Denies  Review of Systems  Constitutional:  Negative for fatigue.  HENT:  Negative for mouth sores and mouth dryness.   Eyes:  Positive for dryness.  Respiratory:  Negative for shortness of breath.   Cardiovascular:  Negative for chest pain and palpitations.  Gastrointestinal:  Negative for blood in stool, constipation and diarrhea.  Endocrine: Negative for increased urination.  Genitourinary:  Positive for involuntary urination.  Musculoskeletal:  Positive for muscle tenderness. Negative for joint pain, gait problem, joint pain, joint swelling, myalgias, muscle weakness, morning stiffness and myalgias.  Skin:  Positive for sensitivity to  sunlight. Negative for color change, rash and hair loss.  Allergic/Immunologic: Negative for susceptible to infections.  Neurological:  Positive for dizziness. Negative for headaches.  Hematological:  Negative for swollen glands.  Psychiatric/Behavioral:  Negative for depressed mood and sleep disturbance. The patient is not nervous/anxious.     PMFS History:  Patient Active Problem List   Diagnosis Date Noted   Abnormal PFT 06/15/2023   Tricuspid regurgitation    Mitral regurgitation    Pulmonary HTN (HCC)    Prediabetes 03/15/2023   History of dehydration 03/15/2023   Osteopenia 09/14/2022   Low back pain with sciatica 08/19/2022   Gait instability 08/19/2022   Hospital discharge follow-up 09/28/2021   Traumatic ecchymosis of head 09/28/2021   Chronic post-traumatic headache, not intractable 09/28/2021   Subdural hematoma (HCC) 09/24/2021   Encephalopathy acute 09/24/2021   Normocytic anemia 09/14/2021   Seasonal allergic rhinitis due to pollen 03/11/2021   DCM (dilated cardiomyopathy) (HCC)    Hyponatremia 06/21/2019   TIA (transient ischemic attack) 06/20/2019   Aortic insufficiency 08/17/2018   Hyperlipidemia    Right thyroid nodule 02/07/2013   Right carotid bruit 07/16/2012   Ocular migraine 11/04/2011   Hypercholesterolemia 06/01/2011   Essential hypertension    History of syncope    Asthma     Past Medical History:  Diagnosis Date   Allergy    Anemia    in her 20's   Aortic insufficiency    i   Arthritis  not dx'd- just per pt    Asthma    AS A CHILD   Cancer (HCC)    endometrial cancer with hysterectomy in 1987   Cancer (HCC)    melanoma on face    Cataract    removed both eyes    DCM (dilated cardiomyopathy) (HCC)    EF 50-55%   GERD (gastroesophageal reflux disease)    occ has with diet    History of syncope    HTN (hypertension)    Hyperlipidemia    Mitral regurgitation    mild by echo 03/2023   OSA on CPAP    Pulmonary HTN (HCC)    PASP  on echo 03/2023   TIA (transient ischemic attack)    Tricuspid regurgitation    moderate by echo 03/2023    Family History  Problem Relation Age of Onset   Heart disease Mother    Kidney failure Mother    Diabetes Mother    Hyperlipidemia Mother    Hypertension Mother    Uterine cancer Mother    Dementia Father    Diabetes Sister    Multiple sclerosis Sister    Stomach cancer Sister    Dementia Sister    Throat cancer Brother        smoker   Diabetes Paternal Grandmother    Mitral valve prolapse Daughter    Colon cancer Neg Hx    Colon polyps Neg Hx    Esophageal cancer Neg Hx    Rectal cancer Neg Hx    Past Surgical History:  Procedure Laterality Date   ABDOMINAL HYSTERECTOMY     ANKLE FRACTURE SURGERY Left    APPENDECTOMY     CATARACT EXTRACTION, BILATERAL     CHOLECYSTECTOMY     COLONOSCOPY  08/01/2007   Dr Marina Goodell    DENTAL SURGERY     MELANOMA EXCISION     face   ovary removal     right leg  fracture surgery      SKIN BIOPSY     pre-cancerous on face   Social History   Social History Narrative   Lives at home alone   1 cup of coffee per day    Works at [employer] History  Administered Date(s) Administered   Fluad Quad(high Dose 65+) 05/31/2022   Influenza Split 06/19/2014, 06/05/2017   Influenza, High Dose Seasonal PF 07/16/2018   Influenza-Unspecified 05/31/2015, 06/13/2016, 06/23/2020, 07/04/2021   Moderna Sars-Covid-2 Vaccination 08/01/2020   PFIZER(Purple Top)SARS-COV-2 Vaccination 11/06/2019, 11/27/2019   Pneumococcal Conjugate-13 04/14/2017   Pneumococcal Polysaccharide-23 06/28/2009   Tdap 01/06/2023     Objective: Vital Signs: BP (!) 146/70 (BP Location: Left Arm, Patient Position: Sitting, Cuff Size: Normal)   Pulse 72   Resp 14   Ht 5\' 3"  (1.6 m)   Wt 190 lb (86.2 kg)   BMI 33.66 kg/m    Physical Exam Vitals and nursing note reviewed.  Constitutional:      Appearance: She is well-developed.  HENT:      Head: Normocephalic and atraumatic.  Eyes:     Conjunctiva/sclera: Conjunctivae normal.  Cardiovascular:     Rate and Rhythm: Normal rate and regular rhythm.     Heart sounds: Normal heart sounds.  Pulmonary:     Effort: Pulmonary effort is normal.     Breath sounds: Normal breath sounds.  Abdominal:     General: Bowel sounds are normal.     Palpations: Abdomen is soft.  Musculoskeletal:  Cervical back: Normal range of motion.     Right lower leg: Edema present.     Left lower leg: Edema present.  Lymphadenopathy:     Cervical: No cervical adenopathy.  Skin:    General: Skin is warm and dry.     Capillary Refill: Capillary refill takes less than 2 seconds.  Neurological:     Mental Status: She is alert and oriented to person, place, and time.  Psychiatric:        Behavior: Behavior normal.      Musculoskeletal Exam: Patient had limited range of motion of the cervical spine.  Thoracic kyphosis was noted.  She had no tenderness over thoracic region.  She has some discomfort range of motion of her lumbar spine.  There was no point tenderness.  Shoulders, elbows, wrist joints with good range of motion.  She had bilateral CMC PIP and DIP thickening with no synovitis.  Hip joints and knee joints were in good range of motion.  She had pitting edema over bilateral lower extremities.  There was no synovitis of ankles or MTPs.  CDAI Exam: CDAI Score: -- Patient Global: --; Provider Global: -- Swollen: --; Tender: -- Joint Exam 07/12/2023   No joint exam has been documented for this visit   There is currently no information documented on the homunculus. Go to the Rheumatology activity and complete the homunculus joint exam.  Investigation: No additional findings.  Imaging: No results found.  Recent Labs: Lab Results  Component Value Date   WBC 8.3 03/15/2023   HGB 11.8 (L) 03/15/2023   PLT 242.0 03/15/2023   NA 138 04/28/2023   K 5.0 04/28/2023   CL 103 04/28/2023   CO2  22 04/28/2023   GLUCOSE 91 04/28/2023   BUN 28 (H) 04/28/2023   CREATININE 0.84 04/28/2023   BILITOT 0.8 03/15/2023   ALKPHOS 98 03/15/2023   AST 21 03/15/2023   ALT 16 03/15/2023   PROT 7.2 03/21/2023   ALBUMIN 4.0 03/15/2023   CALCIUM 9.9 04/28/2023   GFRAA 82 07/10/2020    Speciality Comments: Fosamax started July 30,2024  Procedures:  No procedures performed Allergies: Lovastatin and Sudafed [pseudoephedrine hcl]   Assessment / Plan:     Visit Diagnoses: Osteopenia of multiple sites - 08/23/22: DEXA scan left femoral neck -1.9, no comparison.  No history of a stress fracture. Fosamax 70 mg p.o. weekly started on April 11, 2023.  She has been tolerating Fosamax without any side effects. she has been taking calcium and vitamin D.  She also walks for exercise and works part-time.  Patient reports that she had recent labs through her PCP.  Will request those labs.  Vitamin D deficiency-her vitamin D was normal at 41 on March 21, 2023.  Unstable gait - Due to history of left ankle fracture in 1983 and right tibia fracture 1999 after significant falls.  She was referred to physical therapy.  She noticed some improvement in her gait.  She still has difficulty with her gait due to neuropathy.  Primary osteoarthritis of both hands-she has bilateral PIP and DIP thickening and stiffness in her hands but not much pain.  She has Dupuytren's contracture which are currently not bothersome.  History of fracture of left ankle - 1983 after jumping in the swimming pool requiring surgery.  Paresthesia of both feet - Intermittent paresthesias in feet.  Spondylosis of lumbar spine -she continues to have some lower back pain.  She states her back flared recently which is gradually  getting better.  X-rays showed multilevel spondylosis, L4-5 spondylolisthesis and facet joint arthropathy.  She plans to go back to physical therapy for lower back pain.  Pedal edema-pitting edema was noted.  Patient states  she has difficulty with tighter compression socks.  She has been using compression socks with less compression.  Other medical problems are listed as follows:  Subdural hematoma (HCC) - 2023 after a fall.  TIA (transient ischemic attack)  Essential hypertension-blood pressure was 146/70.  Nonrheumatic aortic valve insufficiency  Hypercholesterolemia  Mild intermittent asthma, unspecified whether complicated  DCM (dilated cardiomyopathy) (HCC)  Seasonal allergic rhinitis due to pollen  Ocular migraine  History of melanoma  Right thyroid nodule  Orders: No orders of the defined types were placed in this encounter.  No orders of the defined types were placed in this encounter.   Follow-Up Instructions: Return in about 6 months (around 01/10/2024) for Osteoporosis, Osteoarthritis.   Pollyann Savoy, MD  Note - This record has been created using Animal nutritionist.  Chart creation errors have been sought, but may not always  have been located. Such creation errors do not reflect on  the standard of medical care.

## 2023-06-29 ENCOUNTER — Telehealth: Payer: Self-pay

## 2023-06-29 NOTE — Telephone Encounter (Signed)
Patient states she is returning your call. Please advise

## 2023-06-29 NOTE — Telephone Encounter (Signed)
-----   Message from Nurse Alcario Drought E sent at 06/14/2023 11:50 AM EDT ----- Regarding: follow up pulmonary appt Referred to Dr. Judeth Horn.

## 2023-06-29 NOTE — Telephone Encounter (Signed)
Spoke to [place] at [facility], she states patient was referred to Dr. Judeth Horn for ILD but Dr. Marchelle Gearing is who sees taht condition. Referral transferred to Dr. Marchelle Gearing.

## 2023-07-03 NOTE — Telephone Encounter (Signed)
Called, no answer. Left detailed message per DPR explaining that referral was changed from Dr. Judeth Horn to Dr. Marchelle Gearing.

## 2023-07-10 ENCOUNTER — Ambulatory Visit: Payer: Medicare Other

## 2023-07-11 ENCOUNTER — Telehealth: Payer: Self-pay

## 2023-07-11 NOTE — Telephone Encounter (Signed)
Call to Dr. Jane Canary office  to check on referral status, was able to get patient scheduled for Dec. 3 at 3:30 with Dr. Marchelle Gearing at [facility]. Location. Call to patient to confirm she can make this appointment, patient states this will work for her.

## 2023-07-12 ENCOUNTER — Ambulatory Visit: Payer: Medicare Other | Attending: Rheumatology | Admitting: Rheumatology

## 2023-07-12 ENCOUNTER — Encounter: Payer: Self-pay | Admitting: Rheumatology

## 2023-07-12 VITALS — BP 156/71 | HR 72 | Resp 14 | Ht 63.0 in | Wt 190.0 lb

## 2023-07-12 DIAGNOSIS — R2681 Unsteadiness on feet: Secondary | ICD-10-CM | POA: Diagnosis not present

## 2023-07-12 DIAGNOSIS — M47816 Spondylosis without myelopathy or radiculopathy, lumbar region: Secondary | ICD-10-CM

## 2023-07-12 DIAGNOSIS — J4520 Mild intermittent asthma, uncomplicated: Secondary | ICD-10-CM

## 2023-07-12 DIAGNOSIS — M19041 Primary osteoarthritis, right hand: Secondary | ICD-10-CM | POA: Diagnosis not present

## 2023-07-12 DIAGNOSIS — Z85820 Personal history of malignant melanoma of skin: Secondary | ICD-10-CM

## 2023-07-12 DIAGNOSIS — R600 Localized edema: Secondary | ICD-10-CM

## 2023-07-12 DIAGNOSIS — E559 Vitamin D deficiency, unspecified: Secondary | ICD-10-CM

## 2023-07-12 DIAGNOSIS — I351 Nonrheumatic aortic (valve) insufficiency: Secondary | ICD-10-CM

## 2023-07-12 DIAGNOSIS — G43109 Migraine with aura, not intractable, without status migrainosus: Secondary | ICD-10-CM

## 2023-07-12 DIAGNOSIS — J301 Allergic rhinitis due to pollen: Secondary | ICD-10-CM

## 2023-07-12 DIAGNOSIS — I420 Dilated cardiomyopathy: Secondary | ICD-10-CM

## 2023-07-12 DIAGNOSIS — I10 Essential (primary) hypertension: Secondary | ICD-10-CM

## 2023-07-12 DIAGNOSIS — E7800 Pure hypercholesterolemia, unspecified: Secondary | ICD-10-CM

## 2023-07-12 DIAGNOSIS — M19042 Primary osteoarthritis, left hand: Secondary | ICD-10-CM

## 2023-07-12 DIAGNOSIS — R202 Paresthesia of skin: Secondary | ICD-10-CM

## 2023-07-12 DIAGNOSIS — G459 Transient cerebral ischemic attack, unspecified: Secondary | ICD-10-CM

## 2023-07-12 DIAGNOSIS — S065XAA Traumatic subdural hemorrhage with loss of consciousness status unknown, initial encounter: Secondary | ICD-10-CM

## 2023-07-12 DIAGNOSIS — M8589 Other specified disorders of bone density and structure, multiple sites: Secondary | ICD-10-CM | POA: Diagnosis not present

## 2023-07-12 DIAGNOSIS — E041 Nontoxic single thyroid nodule: Secondary | ICD-10-CM

## 2023-07-12 DIAGNOSIS — Z8781 Personal history of (healed) traumatic fracture: Secondary | ICD-10-CM

## 2023-07-17 NOTE — Therapy (Unsigned)
OUTPATIENT PHYSICAL THERAPY THORACOLUMBAR EVALUATION   Patient Name: Kerry [name] MRN: [id number] DOB:May 25, 1941, 82 y.o., female Today's Date: 07/18/2023  END OF SESSION:  PT End of Session - 07/18/23 0953     Visit Number 1    Date for PT Re-Evaluation 09/12/23    PT Start Time 0755    PT Stop Time 0838    PT Time Calculation (min) 43 min    Activity Tolerance Patient tolerated treatment well    Behavior During Therapy [facility] for tasks assessed/performed             Past Medical History:  Diagnosis Date   Allergy    Anemia    in her 20's   Aortic insufficiency    i   Arthritis    not dx'd- just per pt    Asthma    AS A CHILD   Cancer (HCC)    endometrial cancer with hysterectomy in 1987   Cancer (HCC)    melanoma on face    Cataract    removed both eyes    DCM (dilated cardiomyopathy) (HCC)    EF 50-55%   GERD (gastroesophageal reflux disease)    occ has with diet    History of syncope    HTN (hypertension)    Hyperlipidemia    Mitral regurgitation    mild by echo 03/2023   OSA on CPAP    Pulmonary HTN (HCC)    PASP on echo 03/2023   TIA (transient ischemic attack)    Tricuspid regurgitation    moderate by echo 03/2023   Past Surgical History:  Procedure Laterality Date   ABDOMINAL HYSTERECTOMY     ANKLE FRACTURE SURGERY Left    APPENDECTOMY     CATARACT EXTRACTION, BILATERAL     CHOLECYSTECTOMY     COLONOSCOPY  08/01/2007   Dr Marina Goodell    DENTAL SURGERY     MELANOMA EXCISION     face   ovary removal     right leg  fracture surgery      SKIN BIOPSY     pre-cancerous on face   Patient Active Problem List   Diagnosis Date Noted   Abnormal PFT 06/15/2023   Tricuspid regurgitation    Mitral regurgitation    Pulmonary HTN (HCC)    Prediabetes 03/15/2023   History of dehydration 03/15/2023   Osteopenia 09/14/2022   Low back pain with sciatica 08/19/2022   Gait instability 08/19/2022   Hospital discharge follow-up 09/28/2021    Traumatic ecchymosis of head 09/28/2021   Chronic post-traumatic headache, not intractable 09/28/2021   Subdural hematoma (HCC) 09/24/2021   Encephalopathy acute 09/24/2021   Normocytic anemia 09/14/2021   Seasonal allergic rhinitis due to pollen 03/11/2021   DCM (dilated cardiomyopathy) (HCC)    Hyponatremia 06/21/2019   TIA (transient ischemic attack) 06/20/2019   Aortic insufficiency 08/17/2018   Hyperlipidemia    Right thyroid nodule 02/07/2013   Right carotid bruit 07/16/2012   Ocular migraine 11/04/2011   Hypercholesterolemia 06/01/2011   Essential hypertension    History of syncope    Asthma     PCP: Mliss Sax, MD  REFERRING PROVIDER: Mliss Sax, MD  REFERRING DIAG: M54.41 (ICD-10-CM) - Right-sided low back pain with right-sided sciatica, unspecified chronicity   Rationale for Evaluation and Treatment: Rehabilitation  THERAPY DIAG:  Degeneration of intervertebral disc of lumbar region with discogenic back pain and lower extremity pain  Unstable gait  Muscle weakness (generalized)  Abnormal posture  ONSET DATE: 07/17/23  SUBJECTIVE:                                                                                                                                                                                           SUBJECTIVE STATEMENT: Patient reports that she has a long history of LBP, was told recently that she has arthritis in her back. Inconsistent, but does radiate into her foot at times.  PERTINENT HISTORY:  Per referring physician note: 90-month history of intermittent lower back pain on the right greater than left side. There is sometimes movement of the pain down through the back of the leg to the heel. Pain sometimes moves up the leg into the buttock as well. Denies provocative or alleviating factors. There are no saddle paresthesias. Denies weakness in her lower extremities. There is tingling sometimes in the right leg and foot.  No bowel or bladder incontinence. She just finished physical therapy for gait stabilization.   PAIN:  Are you having pain? Yes: NPRS scale: 5/10 Pain location: LBP, with T & N into R foot Pain description: cramping in calf and groin Aggravating factors: Bending/reaching to the floor Relieving factors: Standing still for a few minutes  PRECAUTIONS: Back, Fall, and Other: osteopenia  RED FLAGS: None   WEIGHT BEARING RESTRICTIONS: No  FALLS:  Has patient fallen in last 6 months? Yes. Number of falls 1  LIVING ENVIRONMENT: Lives with: lives alone Lives in: House/apartment Stairs: No Has following equipment at home: Single point cane  OCCUPATION: Work in retail 5 hrs in dressing room.   PLOF: Independent, sleeps in a recliner for many years.  PATIENT GOALS: Patient would like to get relief of her back pain and to manage it if she has a flare up.  NEXT MD VISIT: about 3 months  OBJECTIVE:  Note: Objective measures were completed at Evaluation unless otherwise noted.  DIAGNOSTIC FINDINGS:  Osteopenia of multiple sites - 08/23/22: DEXA scan left femoral neck -1.9, no comparison.  No history of a stress fracture.   X-rays showed multilevel spondylosis, L4-5 spondylolisthesis and facet joint arthropathy.  COGNITION: Overall cognitive status: Within functional limits for tasks assessed   SENSATION: Patient reports T and N into her R foot, intermittent, no known pattern of what causes it.   MUSCLE LENGTH: Hamstrings: Right 75 deg; Left 75 deg Thomas test: B hip flexor tightness at neutral ext.  POSTURE: rounded shoulders and anterior pelvic tilt  PALPATION: TTP B lumbar paraspinals, R QL, piriformis, gluts, ITB  LUMBAR ROM: WNL in all planes with mild pain at end of range  LOWER EXTREMITY ROM: L hip flexor tightness,  felt in her back, R knee flexion limited due to pain, but > 120    LOWER EXTREMITY MMT:  BLE strength at least 4/5, but reports pain in back with hip  motions.   LUMBAR SPECIAL TESTS:  Straight leg raise test: Negative, Slump test: Negative, and FABER test: Negative  FUNCTIONAL TESTS:  5 times sit to stand: 17.24 OWESTRY 32%  GAIT: Distance walked: In clinic distances Assistive device utilized: None Level of assistance: Modified independence Comments: Patient moves slightly slowly, cautious, decreased B step length, increased lateral trunk sway.  TODAY'S TREATMENT:                                                                                                                              DATE:  07/18/23  Education   PATIENT EDUCATION:  Education details: POC Person educated: Patient Education method: Explanation Education comprehension: verbalized understanding  HOME EXERCISE PROGRAM: Previous [id number]   ASSESSMENT:  CLINICAL IMPRESSION: Patient is a 82 y.o. who was seen today for physical therapy evaluation and treatment for chronic LBP with R sciatica. Her symptoms are intermittent, with cramping and pain into RLE to her foot. She cannot determine exactly what causes the pain, but it is sometimes triggered when she reaches down to the floor. She demonstrates some proximal weakness, unable to perform a Single leg bridge on either side, as well as some tightness, particularly in hip flexors due to sleeping in a recliner for many years. She also has evidence of some spondylosis in her spine, and flexed posture. She works in a dressing room, sitting/standing, walking around to return clothing. She says standing is the most difficult. She is 32%/moderately impaired according to the Owestry scale. She will benefit from PT to improve her trunk stability, posture, and LE strength, as well as for education on correct lifting techniques to minimize the strain in her back.  OBJECTIVE IMPAIRMENTS: Abnormal gait, decreased activity tolerance, decreased balance, decreased endurance, difficulty walking, decreased ROM, decreased strength,  postural dysfunction, and pain.   ACTIVITY LIMITATIONS: carrying, lifting, bending, sitting, squatting, stairs, transfers, and locomotion level  PARTICIPATION LIMITATIONS: meal prep, cleaning, laundry, driving, shopping, community activity, and occupation  PERSONAL FACTORS: Past/current experiences are also affecting patient's functional outcome.   REHAB POTENTIAL: Good  CLINICAL DECISION MAKING: Stable/uncomplicated  EVALUATION COMPLEXITY: Low   GOALS: Goals reviewed with patient? Yes  SHORT TERM GOALS: Target date: 08/02/23  I with initial HEP Baseline: Goal status: INITIAL  LONG TERM GOALS: Target date: 09/12/23  I with final HEP Baseline:  Goal status: INITIAL  2.  Improve OWESTRY by to 20% or less, indicative of min disability Baseline: 32% Goal status: INITIAL  3.  Patient will be able to walk at least 400' with LRAD, MI, including turns and reaching to simulate work conditions, no pain in back or RLE Baseline:  Goal status: INITIAL  4.  Patient will complete 5 x STS in < 12 sec Baseline: 17.24  Goal status: INITIAL  5.  Patient will return demonstration of appropriate lifting techniques for work. Baseline:  Goal status: INITIAL  PLAN:  PT FREQUENCY: 1x/week  PT DURATION: 8 weeks  PLANNED INTERVENTIONS: 97110-Therapeutic exercises, 97530- Therapeutic activity, [id number]- Neuromuscular re-education, 97535- Self Care, 16109- Manual therapy, [id number]- Gait training, 97014- Electrical stimulation (unattended), [phone]- Ionotophoresis 4mg /ml Dexamethasone, Patient/Family education, Dry Needling, Joint mobilization, Spinal mobilization, Cryotherapy, and Moist heat.  PLAN FOR NEXT SESSION: HEP   Iona Beard, DPT 07/18/2023, 9:58 AM

## 2023-07-18 ENCOUNTER — Ambulatory Visit: Payer: Medicare Other | Attending: Family Medicine | Admitting: Physical Therapy

## 2023-07-18 ENCOUNTER — Encounter: Payer: Self-pay | Admitting: Physical Therapy

## 2023-07-18 DIAGNOSIS — M5441 Lumbago with sciatica, right side: Secondary | ICD-10-CM | POA: Diagnosis not present

## 2023-07-18 DIAGNOSIS — R293 Abnormal posture: Secondary | ICD-10-CM | POA: Insufficient documentation

## 2023-07-18 DIAGNOSIS — M6281 Muscle weakness (generalized): Secondary | ICD-10-CM | POA: Diagnosis not present

## 2023-07-18 DIAGNOSIS — M51362 Other intervertebral disc degeneration, lumbar region with discogenic back pain and lower extremity pain: Secondary | ICD-10-CM | POA: Diagnosis not present

## 2023-07-18 DIAGNOSIS — M5459 Other low back pain: Secondary | ICD-10-CM | POA: Diagnosis not present

## 2023-07-18 DIAGNOSIS — R2681 Unsteadiness on feet: Secondary | ICD-10-CM | POA: Diagnosis not present

## 2023-07-26 ENCOUNTER — Encounter: Payer: Self-pay | Admitting: Physical Therapy

## 2023-07-26 ENCOUNTER — Ambulatory Visit: Payer: Medicare Other | Admitting: Physical Therapy

## 2023-07-26 DIAGNOSIS — R2681 Unsteadiness on feet: Secondary | ICD-10-CM | POA: Diagnosis not present

## 2023-07-26 DIAGNOSIS — M6281 Muscle weakness (generalized): Secondary | ICD-10-CM | POA: Diagnosis not present

## 2023-07-26 DIAGNOSIS — M51362 Other intervertebral disc degeneration, lumbar region with discogenic back pain and lower extremity pain: Secondary | ICD-10-CM

## 2023-07-26 DIAGNOSIS — R293 Abnormal posture: Secondary | ICD-10-CM

## 2023-07-26 DIAGNOSIS — M5459 Other low back pain: Secondary | ICD-10-CM | POA: Diagnosis not present

## 2023-07-26 DIAGNOSIS — M5441 Lumbago with sciatica, right side: Secondary | ICD-10-CM | POA: Diagnosis not present

## 2023-07-26 NOTE — Therapy (Signed)
OUTPATIENT PHYSICAL THERAPY THORACOLUMBAR TREATMENT   Patient Name: Kerry [name] MRN: [id number] DOB:04/30/1941, 82 y.o., female Today's Date: 07/26/2023  END OF SESSION:  PT End of Session - 07/26/23 0801     Visit Number 2    Date for PT Re-Evaluation 09/12/23    PT Start Time 0755    PT Stop Time 0839    PT Time Calculation (min) 44 min    Activity Tolerance Patient tolerated treatment well    Behavior During Therapy [facility] for tasks assessed/performed             Past Medical History:  Diagnosis Date   Allergy    Anemia    in her 20's   Aortic insufficiency    i   Arthritis    not dx'd- just per pt    Asthma    AS A CHILD   Cancer (HCC)    endometrial cancer with hysterectomy in 1987   Cancer (HCC)    melanoma on face    Cataract    removed both eyes    DCM (dilated cardiomyopathy) (HCC)    EF 50-55%   GERD (gastroesophageal reflux disease)    occ has with diet    History of syncope    HTN (hypertension)    Hyperlipidemia    Mitral regurgitation    mild by echo 03/2023   OSA on CPAP    Pulmonary HTN (HCC)    PASP on echo 03/2023   TIA (transient ischemic attack)    Tricuspid regurgitation    moderate by echo 03/2023   Past Surgical History:  Procedure Laterality Date   ABDOMINAL HYSTERECTOMY     ANKLE FRACTURE SURGERY Left    APPENDECTOMY     CATARACT EXTRACTION, BILATERAL     CHOLECYSTECTOMY     COLONOSCOPY  08/01/2007   Dr Marina Goodell    DENTAL SURGERY     MELANOMA EXCISION     face   ovary removal     right leg  fracture surgery      SKIN BIOPSY     pre-cancerous on face   Patient Active Problem List   Diagnosis Date Noted   Abnormal PFT 06/15/2023   Tricuspid regurgitation    Mitral regurgitation    Pulmonary HTN (HCC)    Prediabetes 03/15/2023   History of dehydration 03/15/2023   Osteopenia 09/14/2022   Low back pain with sciatica 08/19/2022   Gait instability 08/19/2022   Hospital discharge follow-up 09/28/2021    Traumatic ecchymosis of head 09/28/2021   Chronic post-traumatic headache, not intractable 09/28/2021   Subdural hematoma (HCC) 09/24/2021   Encephalopathy acute 09/24/2021   Normocytic anemia 09/14/2021   Seasonal allergic rhinitis due to pollen 03/11/2021   DCM (dilated cardiomyopathy) (HCC)    Hyponatremia 06/21/2019   TIA (transient ischemic attack) 06/20/2019   Aortic insufficiency 08/17/2018   Hyperlipidemia    Right thyroid nodule 02/07/2013   Right carotid bruit 07/16/2012   Ocular migraine 11/04/2011   Hypercholesterolemia 06/01/2011   Essential hypertension    History of syncope    Asthma     PCP: Mliss Sax, MD  REFERRING PROVIDER: Mliss Sax, MD  REFERRING DIAG: M54.41 (ICD-10-CM) - Right-sided low back pain with right-sided sciatica, unspecified chronicity   Rationale for Evaluation and Treatment: Rehabilitation  THERAPY DIAG:  Degeneration of intervertebral disc of lumbar region with discogenic back pain and lower extremity pain  Unstable gait  Muscle weakness (generalized)  Abnormal posture  Other low back pain  ONSET DATE: 07/17/23  SUBJECTIVE:                                                                                                                                                                                           SUBJECTIVE STATEMENT: Patient reports that she is doing okay, reports that the past 4 days she has had some HS pain feels very tight  PERTINENT HISTORY:  Per referring physician note: 25-month history of intermittent lower back pain on the right greater than left side. There is sometimes movement of the pain down through the back of the leg to the heel. Pain sometimes moves up the leg into the buttock as well. Denies provocative or alleviating factors. There are no saddle paresthesias. Denies weakness in her lower extremities. There is tingling sometimes in the right leg and foot. No bowel or bladder  incontinence. She just finished physical therapy for gait stabilization.   PAIN:  Are you having pain? Yes: NPRS scale: 5/10 Pain location: LBP, with T & N into R foot Pain description: cramping in calf and groin Aggravating factors: Bending/reaching to the floor Relieving factors: Standing still for a few minutes  PRECAUTIONS: Back, Fall, and Other: osteopenia  RED FLAGS: None   WEIGHT BEARING RESTRICTIONS: No  FALLS:  Has patient fallen in last 6 months? Yes. Number of falls 1  LIVING ENVIRONMENT: Lives with: lives alone Lives in: House/apartment Stairs: No Has following equipment at home: Single point cane  OCCUPATION: Work in retail 5 hrs in dressing room.   PLOF: Independent, sleeps in a recliner for many years.  PATIENT GOALS: Patient would like to get relief of her back pain and to manage it if she has a flare up.  NEXT MD VISIT: about 3 months  OBJECTIVE:  Note: Objective measures were completed at Evaluation unless otherwise noted.  DIAGNOSTIC FINDINGS:  Osteopenia of multiple sites - 08/23/22: DEXA scan left femoral neck -1.9, no comparison.  No history of a stress fracture.   X-rays showed multilevel spondylosis, L4-5 spondylolisthesis and facet joint arthropathy.  COGNITION: Overall cognitive status: Within functional limits for tasks assessed   SENSATION: Patient reports T and N into her R foot, intermittent, no known pattern of what causes it.   MUSCLE LENGTH: Hamstrings: Right 75 deg; Left 75 deg Thomas test: B hip flexor tightness at neutral ext.  POSTURE: rounded shoulders and anterior pelvic tilt  PALPATION: TTP B lumbar paraspinals, R QL, piriformis, gluts, ITB  LUMBAR ROM: WNL in all planes with mild pain at end of range  LOWER EXTREMITY ROM: L hip flexor tightness, felt in  her back, R knee flexion limited due to pain, but > 120    LOWER EXTREMITY MMT:  BLE strength at least 4/5, but reports pain in back with hip motions.   LUMBAR  SPECIAL TESTS:  Straight leg raise test: Negative, Slump test: Negative, and FABER test: Negative  FUNCTIONAL TESTS:  5 times sit to stand: 17.24 OWESTRY 32%  GAIT: Distance walked: In clinic distances Assistive device utilized: None Level of assistance: Modified independence Comments: Patient moves slightly slowly, cautious, decreased B step length, increased lateral trunk sway.  TODAY'S TREATMENT:                                                                                                                              DATE:  07/26/23 Nustep level 5 x 5 minutes Seated row 10# 2x10 Lats 15# 2x10 Standing 5# straight arm pulls 2.5# hip abduction very difficult for her 2.5# hip extension Feet on ball K2C, rotation, small posterior activation and isometric abs HS and piriformis stretches, hip flexor stretches all in supine Green tband clam shells in hooklying Ball b/n knees squeeze STM with Tgun to the HS and hip flexors  07/18/23  Education   PATIENT EDUCATION:  Education details: POC Person educated: Patient Education method: Explanation Education comprehension: verbalized understanding  HOME EXERCISE PROGRAM: Previous [id number]   ASSESSMENT:  CLINICAL IMPRESSION: Patient is a 82 y.o. who was seen today for physical therapy evaluation and treatment for chronic LBP with R sciatica. Her symptoms are intermittent, with cramping and pain into RLE to her foot. She cannot determine exactly what causes the pain, but it is sometimes triggered when she reaches down to the floor. She demonstrates some proximal weakness, unable to perform a Single leg bridge on either side, as well as some tightness, particularly in hip flexors due to sleeping in a recliner for many years. I initiated exercises in the gym to work on core and overall strength, her hips are very weak, struggled with hip extension and abduction due to weakness and fatigue, she was tight in the LE's and tolerated the  stretching but needed to be light, started some STM very tender in the HS.  She will benefit from PT to improve her trunk stability, posture, and LE strength, as well as for education on correct lifting techniques to minimize the strain in her back.  OBJECTIVE IMPAIRMENTS: Abnormal gait, decreased activity tolerance, decreased balance, decreased endurance, difficulty walking, decreased ROM, decreased strength, postural dysfunction, and pain.   ACTIVITY LIMITATIONS: carrying, lifting, bending, sitting, squatting, stairs, transfers, and locomotion level  PARTICIPATION LIMITATIONS: meal prep, cleaning, laundry, driving, shopping, community activity, and occupation  PERSONAL FACTORS: Past/current experiences are also affecting patient's functional outcome.   REHAB POTENTIAL: Good  CLINICAL DECISION MAKING: Stable/uncomplicated  EVALUATION COMPLEXITY: Low   GOALS: Goals reviewed with patient? Yes  SHORT TERM GOALS: Target date: 08/02/23  I with initial HEP Baseline: Goal status: INITIAL  LONG TERM GOALS: Target  date: 09/12/23  I with final HEP Baseline:  Goal status: INITIAL  2.  Improve OWESTRY by to 20% or less, indicative of min disability Baseline: 32% Goal status: INITIAL  3.  Patient will be able to walk at least 400' with LRAD, MI, including turns and reaching to simulate work conditions, no pain in back or RLE Baseline:  Goal status: INITIAL  4.  Patient will complete 5 x STS in < 12 sec Baseline: 17.24 Goal status: INITIAL  5.  Patient will return demonstration of appropriate lifting techniques for work. Baseline:  Goal status: INITIAL  PLAN:  PT FREQUENCY: 1x/week  PT DURATION: 8 weeks  PLANNED INTERVENTIONS: 97110-Therapeutic exercises, 97530- Therapeutic activity, [id number]- Neuromuscular re-education, 97535- Self Care, 25956- Manual therapy, [id number]- Gait training, 97014- Electrical stimulation (unattended), [id number]- Ionotophoresis 4mg /ml Dexamethasone,  Patient/Family education, Dry Needling, Joint mobilization, Spinal mobilization, Cryotherapy, and Moist heat.  PLAN FOR NEXT SESSION: HEP, assess treatment   Stacie Glaze, PT 07/26/2023, 8:02 AM

## 2023-08-02 DIAGNOSIS — C44329 Squamous cell carcinoma of skin of other parts of face: Secondary | ICD-10-CM | POA: Diagnosis not present

## 2023-08-07 ENCOUNTER — Ambulatory Visit: Payer: Medicare Other | Admitting: Physical Therapy

## 2023-08-15 ENCOUNTER — Encounter: Payer: Self-pay | Admitting: Internal Medicine

## 2023-08-15 ENCOUNTER — Ambulatory Visit: Payer: Medicare Other | Admitting: Internal Medicine

## 2023-08-15 VITALS — BP 134/68 | HR 78 | Ht 63.0 in | Wt 190.0 lb

## 2023-08-15 DIAGNOSIS — R942 Abnormal results of pulmonary function studies: Secondary | ICD-10-CM

## 2023-08-15 DIAGNOSIS — R053 Chronic cough: Secondary | ICD-10-CM | POA: Diagnosis not present

## 2023-08-15 DIAGNOSIS — I5189 Other ill-defined heart diseases: Secondary | ICD-10-CM | POA: Diagnosis not present

## 2023-08-15 DIAGNOSIS — Z862 Personal history of diseases of the blood and blood-forming organs and certain disorders involving the immune mechanism: Secondary | ICD-10-CM

## 2023-08-15 DIAGNOSIS — R0609 Other forms of dyspnea: Secondary | ICD-10-CM | POA: Diagnosis not present

## 2023-08-15 LAB — POCT EXHALED NITRIC OXIDE: FeNO level (ppb): 40

## 2023-08-15 NOTE — Progress Notes (Signed)
OV 08/15/2023  Subjective:  Patient ID: Kerry [name], female , DOB: Jul 02, 1941 , age 82 y.o. , MRN: [id number] , ADDRESS: [address] [name] [address] PCP Mliss Sax, MD Patient Care Team: Mliss Sax, MD as PCP - General (Family Medicine) Richardean Chimera, MD as Consulting Physician (Obstetrics and Gynecology) Quintella Reichert, MD as Consulting Physician (Cardiology) [facility]  This Provider for this visit: Treatment Team:  Attending Provider: [name] Shan, MD    08/15/2023 -   Chief Complaint  Patient presents with   Consult    IDS, having sob when talking, and sob when rushing , not able walk for exercise like she use too.      HPI [name] 82 y.o. -82 year old widow.  She lives alone.  She is originally from [place].  She joined her husband here in 1984.  And then she relocated back to [place] in 1993 and then relocated back to [place] in 2000.  Shortly after she relocated here her husband was a heavy smoker developed lung cancer and passed away.  Since then she has been living alone she has 2 children one of them lives in [place] the other is relocated back to [place].  She tells me she had asthma as a child but she outgrew that and she has been on no medications for 30 years.  She only deals with some seasonal allergies for which she takes over-the-counter antihistamine as needed.  Current problems shortness of breath for the last 5 years.  Recently pulmonary function test was abnormal [restriction with low DLCO] and therefore referred here.  She says it is progressive.  She states when she walks around for a while or sometimes when she takes a shower and changes close she will feel short of breath.  Associated with that she also has a cough for the last 5 years.  The cough is much milder and its only that once in a while.  This is 1-2 times a month.  She does bring some green sputum  with it.  There is no wheezing with this.  However exhaled nitric oxide today slightly elevated 40 ppb.  She does have childhood history of asthma.  She does not have any orthopnea but she does sleep in a recliner because of her back issues.  She is been doing this for 18 months.  She does not know if she snores because she lives alone.  She is not smoker but I believe she was passively smoking up until 24 years ago when her husband died from lung cancer.   Cardiac workup reveals -2019 cardiac stress test with a low risk -July 2024 cardiac echo which is stable with EF 50-55% grade 1 diastolic dysfunction and elevated pulmonary pressures  Other -Normal autoimmune workup in 2024 -Normal kidney function August 2024 -Mild anemia in July 2024 which is chronic and 11 g% approximate  Simple office walk 224 (66+46 x 2) feet Pod A at [facility] x  3 laps goal with forehead probe 08/15/2023    O2 used ra   Number laps completed Sit stand x 10   Comments about pace normal   Resting Pulse Ox/HR 98% and 77/min   Final Pulse Ox/HR 97% and 87/min   Desaturated </= 88% no   Desaturated <= 3% points no   Got Tachycardic >/= 90/min no   Symptoms at end of test Mild dyspnea   Miscellaneous comments x  CT Chest data from date:  10/21/19 CT CORONARIES  - personally visualized and independently interpreted : yes - my findings are: agree Narrative & Impression  EXAM: OVER-READ INTERPRETATION  CT CHEST   The following report is an over-read performed by radiologist Dr. Irish Lack of [facility] on 10/21/2019. This over-read does not include interpretation of cardiac or coronary anatomy or pathology. The coronary CTA interpretation by the cardiologist is attached.   COMPARISON:  None.   FINDINGS: Vascular: No incidental findings.   Mediastinum/Nodes: Visualized mediastinum and hilar regions demonstrate no masses or lymphadenopathy. There is a moderate-sized hiatal hernia.    Lungs/Pleura: Visualized lungs show no evidence of pulmonary edema, consolidation, pneumothorax, nodule or pleural fluid.   Upper Abdomen: No acute abnormality.   Musculoskeletal: No chest wall mass or suspicious bone lesions identified.   IMPRESSION: Moderate hiatal hernia.   Electronically Signed: By: Irish Lack M.D. On: 10/21/2019 08:21    PFT    Latest Ref Rng & Units 05/29/2023    7:45 AM  PFT Results  FVC-Pre L 1.87   FVC-Predicted Pre % 76   FVC-Post L 1.97   FVC-Predicted Post % 80   Pre FEV1/FVC % % 77   Post FEV1/FCV % % 77   FEV1-Pre L 1.45   FEV1-Predicted Pre % 79   FEV1-Post L 1.51   DLCO uncorrected ml/min/mmHg 9.88   DLCO UNC% % 54   DLVA Predicted % 73   TLC L 4.43   TLC % Predicted % 90   RV % Predicted % 105      Latest Reference Range & Units 04/11/23 11:44  Sed Rate 0 - 40 mm/hr 17  Anti Nuclear Antibody (ANA) Negative  Negative  RA Latex Turbid. <14.0 IU/mL <10.0     LAB RESULTS last 96 hours No results found.  LAB RESULTS last 90 days Recent Results (from the past 2160 hour(s))  Pulmonary function test     Status: None   Collection Time: 05/29/23  7:45 AM  Result Value Ref Range   FVC-Pre 1.87 L   FVC-%Pred-Pre 76 %   FVC-Post 1.97 L   FVC-%Pred-Post 80 %   FVC-%Change-Post 5 %   FEV1-Pre 1.45 L   FEV1-%Pred-Pre 79 %   FEV1-Post 1.51 L   FEV1-%Pred-Post 83 %   FEV1-%Change-Post 4 %   FEV6-Pre 1.85 L   FEV6-%Pred-Pre 80 %   FEV6-Post 1.97 L   FEV6-%Pred-Post 85 %   FEV6-%Change-Post 6 %   Pre FEV1/FVC ratio 77 %   FEV1FVC-%Pred-Pre 105 %   Post FEV1/FVC ratio 77 %   FEV1FVC-%Change-Post 0 %   Pre FEV6/FVC Ratio 99 %   FEV6FVC-%Pred-Pre 105 %   Post FEV6/FVC ratio 100 %   FEV6FVC-%Pred-Post 106 %   FEV6FVC-%Change-Post 0 %   FEF 25-75 Pre 1.14 L/sec   FEF2575-%Pred-Pre 87 %   FEF 25-75 Post 1.41 L/sec   FEF2575-%Pred-Post 108 %   FEF2575-%Change-Post 23 %   RV 2.49 L   RV % pred 105 %   TLC 4.43 L   TLC %  pred 90 %   DLCO unc 9.88 ml/min/mmHg   DLCO unc % pred 54 %   DL/VA 3.29 ml/min/mmHg/L   DL/VA % pred 73 %  Hemoglobin A1c     Status: None   Collection Time: 06/15/23  9:42 AM  Result Value Ref Range   Hgb A1c MFr Bld 5.9 4.6 - 6.5 %    Comment: Glycemic Control  Guidelines for People with Diabetes:Non Diabetic:  <6%Goal of Therapy: <7%Additional Action Suggested:  >8%   POCT EXHALED NITRIC OXIDE     Status: None   Collection Time: 08/15/23  4:06 PM  Result Value Ref Range   FeNO level (ppb) 40          has a past medical history of Allergy, Anemia, Aortic insufficiency, Arthritis, Asthma, Cancer (HCC), Cancer (HCC), Cataract, DCM (dilated cardiomyopathy) (HCC), GERD (gastroesophageal reflux disease), History of syncope, HTN (hypertension), Hyperlipidemia, Mitral regurgitation, OSA on CPAP, Pulmonary HTN (HCC), TIA (transient ischemic attack), and Tricuspid regurgitation.   reports that she has never smoked. She has been exposed to tobacco smoke. She has never used smokeless tobacco.  Past Surgical History:  Procedure Laterality Date   ABDOMINAL HYSTERECTOMY     ANKLE FRACTURE SURGERY Left    APPENDECTOMY     CATARACT EXTRACTION, BILATERAL     CHOLECYSTECTOMY     COLONOSCOPY  08/01/2007   Dr Marina Goodell    DENTAL SURGERY     MELANOMA EXCISION     face   ovary removal     right leg  fracture surgery      SKIN BIOPSY     pre-cancerous on face    Allergies  Allergen Reactions   Lovastatin Other (See Comments)    myalgias   Sudafed [Pseudoephedrine Hcl] Other (See Comments)    Hallucinations     Immunization History  Administered Date(s) Administered   Fluad Quad(high Dose 65+) 05/31/2022   Influenza Split 06/19/2014, 06/05/2017   Influenza, High Dose Seasonal PF 07/16/2018   Influenza-Unspecified 05/31/2015, 06/13/2016, 06/23/2020, 07/04/2021   Moderna Sars-Covid-2 Vaccination 08/01/2020   PFIZER(Purple Top)SARS-COV-2 Vaccination 11/06/2019, 11/27/2019    Pneumococcal Conjugate-13 04/14/2017   Pneumococcal Polysaccharide-23 06/28/2009   Tdap 01/06/2023    Family History  Problem Relation Age of Onset   Heart disease Mother    Kidney failure Mother    Diabetes Mother    Hyperlipidemia Mother    Hypertension Mother    Uterine cancer Mother    Dementia Father    Diabetes Sister    Multiple sclerosis Sister    Stomach cancer Sister    Dementia Sister    Throat cancer Brother        smoker   Diabetes Paternal Grandmother    Mitral valve prolapse Daughter    Colon cancer Neg Hx    Colon polyps Neg Hx    Esophageal cancer Neg Hx    Rectal cancer Neg Hx      Current Outpatient Medications:    acetaminophen (TYLENOL) 500 MG tablet, Take 500 mg by mouth every 6 (six) hours as needed for moderate pain or headache. , Disp: , Rfl:    alendronate (FOSAMAX) 70 MG tablet, Take 1 tablet (70 mg total) by mouth once a week. Take with a full glass of water on an empty stomach., Disp: 12 tablet, Rfl: 1   Ascorbic Acid (VITAMIN C PO), Take 1 tablet by mouth daily., Disp: , Rfl:    aspirin 81 MG chewable tablet, Chew 81 mg by mouth every other day., Disp: , Rfl:    atorvastatin (LIPITOR) 20 MG tablet, TAKE 1 TABLET(20 MG) BY MOUTH DAILY, Disp: 90 tablet, Rfl: 2   carvedilol (COREG) 12.5 MG tablet, TAKE 1 TABLET(12.5 MG) BY MOUTH TWICE DAILY. KEEP APPOINTMENT FOR FURTHER REFILLS, Disp: 180 tablet, Rfl: 3   Cholecalciferol (VITAMIN D3) 2000 units capsule, Take 2,000 Units by mouth daily., Disp: , Rfl:  Coenzyme Q10-Vitamin E (QUNOL ULTRA COQ10 PO), Take 1 capsule by mouth daily., Disp: , Rfl:    Cyanocobalamin 500 MCG CHEW, Take one daily, Disp: 90 tablet, Rfl: 1   fexofenadine (ALLEGRA) 180 MG tablet, Take 180 mg by mouth every morning., Disp: , Rfl:    hydrochlorothiazide (HYDRODIURIL) 25 MG tablet, Take 1 tablet (25 mg total) by mouth daily., Disp: 90 tablet, Rfl: 3   lisinopril (ZESTRIL) 20 MG tablet, Take 1 tablet (20 mg total) by mouth daily.,  Disp: 90 tablet, Rfl: 3   Misc Natural Products (NEURIVA PO), Take 1 tablet by mouth daily., Disp: , Rfl:    Multiple Vitamin (MULTIVITAMIN WITH MINERALS) TABS tablet, Take 1 tablet by mouth daily., Disp: , Rfl:    Polyethyl Glycol-Propyl Glycol (SYSTANE) 0.4-0.3 % SOLN, Place 1 drop into both eyes daily as needed (dry eyes)., Disp: , Rfl:       Objective:   Vitals:   08/15/23 1522  BP: 134/68  Pulse: 78  SpO2: 98%  Weight: 190 lb (86.2 kg)  Height: 5\' 3"  (1.6 m)    Estimated body mass index is 33.66 kg/m as calculated from the following:   Height as of this encounter: 5\' 3"  (1.6 m).   Weight as of this encounter: 190 lb (86.2 kg).  @WEIGHTCHANGE @  Filed Weights   08/15/23 1522  Weight: 190 lb (86.2 kg)     Physical Exam   General: No distress. Looks well but her age O2 at rest: no Cane present: no Sitting in wheel chair: no Frail: no Obese: no Neuro: Alert and Oriented x 3. GCS 15. Speech normal Psych: Pleasant Resp:  Barrel Chest - no.  Wheeze - no, Crackles - no, No overt respiratory distress CVS: Normal heart sounds. Murmurs - no Ext: Stigmata of Connective Tissue Disease - no HEENT: Normal upper airway. PEERL +. No post nasal drip        Assessment:       ICD-10-CM   1. DOE (dyspnea on exertion)  R06.09 POCT EXHALED NITRIC OXIDE    2. Chronic cough  R05.3     3. Abnormal PFT  R94.2     4. Grade I diastolic dysfunction  I51.89     5. History of anemia  Z86.2          Plan:     Patient Instructions     ICD-10-CM   1. DOE (dyspnea on exertion)  R06.09 POCT EXHALED NITRIC OXIDE    2. Chronic cough  R05.3     3. Abnormal PFT  R94.2     4. Grade I diastolic dysfunction  I51.89     5. History of anemia  Z86.2      Unclear reasons for shortness of breath.  Could be physical conditioning related versus cardiac [stiff heart muscle or increased pressures] versus lung related.  Airway realted. Fenot 40 pbb and slightly high and could be  asthma  Plan - Do blood work for CBC differential and RAST allergy panel -Do blood work for BNP - Do high-resolution CT chest supine and prone - start empric trelegy inhaler (100) to see if symptoms improve between now and next vsiti   Follow-up - Return to see nurse practitioner in the next 4-8 weeks to discuss test results in the next steps  -     FOLLOWUP Return in about 6 weeks (around 09/26/2023) for 15 min visit, with any of the APPS, Face to Face Visit, Dyspnea.    SIGNATURE  Dr. [name] Shan, M.D., F.C.C.P,  Pulmonary and Critical Care Medicine Staff Physician, [facility] Center Director - Interstitial Lung Disease  Program  Pulmonary Fibrosis [facility] at [facility] [place], [name], [address]  Pager: [phone], If no answer or between  15:00h - 7:00h: call [phone] Telephone: [phone]  4:09 PM 08/15/2023

## 2023-08-15 NOTE — Patient Instructions (Addendum)
ICD-10-CM   1. DOE (dyspnea on exertion)  R06.09 POCT EXHALED NITRIC OXIDE    2. Chronic cough  R05.3     3. Abnormal PFT  R94.2     4. Grade I diastolic dysfunction  I51.89     5. History of anemia  Z86.2      Unclear reasons for shortness of breath.  Could be physical conditioning related versus cardiac [stiff heart muscle or increased pressures] versus lung related.  Airway realted. Fenot 40 pbb and slightly high and could be asthma  Plan - Do blood work for CBC differential and RAST allergy panel -Do blood work for BNP - Do high-resolution CT chest supine and prone - start empric trelegy inhaler (100) to see if symptoms improve between now and next vsiti   Follow-up - Return to see nurse practitioner in the next 4-8 weeks to discuss test results in the next steps  -

## 2023-08-16 LAB — CBC WITH DIFFERENTIAL/PLATELET
Basophils Absolute: 0.1 10*3/uL (ref 0.0–0.1)
Basophils Relative: 0.8 % (ref 0.0–3.0)
Eosinophils Absolute: 0.4 10*3/uL (ref 0.0–0.7)
Eosinophils Relative: 4.8 % (ref 0.0–5.0)
HCT: 35.7 % — ABNORMAL LOW (ref 36.0–46.0)
Hemoglobin: 12 g/dL (ref 12.0–15.0)
Lymphocytes Relative: 20.1 % (ref 12.0–46.0)
Lymphs Abs: 1.7 10*3/uL (ref 0.7–4.0)
MCHC: 33.5 g/dL (ref 30.0–36.0)
MCV: 92.7 fL (ref 78.0–100.0)
Monocytes Absolute: 0.8 10*3/uL (ref 0.1–1.0)
Monocytes Relative: 8.8 % (ref 3.0–12.0)
Neutro Abs: 5.6 10*3/uL (ref 1.4–7.7)
Neutrophils Relative %: 65.5 % (ref 43.0–77.0)
Platelets: 265 10*3/uL (ref 150.0–400.0)
RBC: 3.85 Mil/uL — ABNORMAL LOW (ref 3.87–5.11)
RDW: 13.2 % (ref 11.5–15.5)
WBC: 8.5 10*3/uL (ref 4.0–10.5)

## 2023-08-16 LAB — BRAIN NATRIURETIC PEPTIDE: Pro B Natriuretic peptide (BNP): 186 pg/mL — ABNORMAL HIGH (ref 0.0–100.0)

## 2023-08-18 LAB — ALLERGEN PROFILE, PERENNIAL ALLERGEN IGE
Alternaria Alternata IgE: <0.1 kU/L
Aspergillus Fumigatus IgE: <0.1 kU/L
Aureobasidi Pullulans IgE: <0.1 kU/L
Candida Albicans IgE: <0.1 kU/L
Cat Dander IgE: 0.35 kU/L — AB
Chicken Feathers IgE: <0.1 kU/L
Cladosporium Herbarum IgE: <0.1 kU/L
Cow Dander IgE: <0.1 kU/L
D Farinae IgE: 0.77 kU/L — AB
D Pteronyssinus IgE: 0.99 kU/L — AB
Dog Dander IgE: <0.1 kU/L
Duck Feathers IgE: <0.1 kU/L
Goose Feathers IgE: <0.1 kU/L
Mouse Urine IgE: <0.1 kU/L
Mucor Racemosus IgE: <0.1 kU/L
Penicillium Chrysogen IgE: <0.1 kU/L
Phoma Betae IgE: <0.1 kU/L
Setomelanomma Rostrat: <0.1 kU/L
Stemphylium Herbarum IgE: <0.1 kU/L

## 2023-08-22 NOTE — Progress Notes (Signed)
Tested positive for dust mite and cat dander and also slight high eosinophils suggesting possible asthma.  In addition BNP slightly high suggesting stiff heart muscle.  We will discuss this at follow-up.

## 2023-08-24 ENCOUNTER — Encounter: Payer: Self-pay | Admitting: Physical Therapy

## 2023-08-24 ENCOUNTER — Ambulatory Visit: Payer: Medicare Other | Attending: Family Medicine | Admitting: Physical Therapy

## 2023-08-24 DIAGNOSIS — R293 Abnormal posture: Secondary | ICD-10-CM | POA: Diagnosis not present

## 2023-08-24 DIAGNOSIS — M8589 Other specified disorders of bone density and structure, multiple sites: Secondary | ICD-10-CM | POA: Diagnosis not present

## 2023-08-24 DIAGNOSIS — M5459 Other low back pain: Secondary | ICD-10-CM | POA: Insufficient documentation

## 2023-08-24 DIAGNOSIS — L929 Granulomatous disorder of the skin and subcutaneous tissue, unspecified: Secondary | ICD-10-CM | POA: Diagnosis not present

## 2023-08-24 DIAGNOSIS — M51362 Other intervertebral disc degeneration, lumbar region with discogenic back pain and lower extremity pain: Secondary | ICD-10-CM | POA: Insufficient documentation

## 2023-08-24 DIAGNOSIS — M6281 Muscle weakness (generalized): Secondary | ICD-10-CM | POA: Diagnosis not present

## 2023-08-24 DIAGNOSIS — R2681 Unsteadiness on feet: Secondary | ICD-10-CM | POA: Diagnosis not present

## 2023-08-24 NOTE — Therapy (Signed)
OUTPATIENT PHYSICAL THERAPY THORACOLUMBAR TREATMENT   Patient Name: Kerry [name] MRN: [id number] DOB:[birth year], 82 y.o., female Today's Date: 08/24/2023  END OF SESSION:  PT End of Session - 08/24/23 0803     Visit Number 3    Date for PT Re-Evaluation 09/12/23    PT Start Time 0800    PT Stop Time 0840    PT Time Calculation (min) 40 min              Past Medical History:  Diagnosis Date   Allergy    Anemia    in her 20's   Aortic insufficiency    i   Arthritis    not dx'd- just per pt    Asthma    AS A CHILD   Cancer (HCC)    endometrial cancer with hysterectomy in 1987   Cancer (HCC)    melanoma on face    Cataract    removed both eyes    DCM (dilated cardiomyopathy) (HCC)    EF 50-55%   GERD (gastroesophageal reflux disease)    occ has with diet    History of syncope    HTN (hypertension)    Hyperlipidemia    Mitral regurgitation    mild by echo 03/2023   OSA on CPAP    Pulmonary HTN (HCC)    PASP on echo 03/2023   TIA (transient ischemic attack)    Tricuspid regurgitation    moderate by echo 03/2023   Past Surgical History:  Procedure Laterality Date   ABDOMINAL HYSTERECTOMY     ANKLE FRACTURE SURGERY Left    APPENDECTOMY     CATARACT EXTRACTION, BILATERAL     CHOLECYSTECTOMY     COLONOSCOPY  08/01/2007   Dr Marina Goodell    DENTAL SURGERY     MELANOMA EXCISION     face   ovary removal     right leg  fracture surgery      SKIN BIOPSY     pre-cancerous on face   Patient Active Problem List   Diagnosis Date Noted   Abnormal PFT 06/15/2023   Tricuspid regurgitation    Mitral regurgitation    Pulmonary HTN (HCC)    Prediabetes 03/15/2023   History of dehydration 03/15/2023   Osteopenia 09/14/2022   Low back pain with sciatica 08/19/2022   Gait instability 08/19/2022   Hospital discharge follow-up 09/28/2021   Traumatic ecchymosis of head 09/28/2021   Chronic post-traumatic headache, not intractable 09/28/2021   Subdural hematoma  (HCC) 09/24/2021   Encephalopathy acute 09/24/2021   Normocytic anemia 09/14/2021   Seasonal allergic rhinitis due to pollen 03/11/2021   DCM (dilated cardiomyopathy) (HCC)    Hyponatremia 06/21/2019   TIA (transient ischemic attack) 06/20/2019   Aortic insufficiency 08/17/2018   Hyperlipidemia    Right thyroid nodule 02/07/2013   Right carotid bruit 07/16/2012   Ocular migraine 11/04/2011   Hypercholesterolemia 06/01/2011   Essential hypertension    History of syncope    Asthma     PCP: Mliss Sax, MD  REFERRING PROVIDER: Mliss Sax, MD  REFERRING DIAG: M54.41 (ICD-10-CM) - Right-sided low back pain with right-sided sciatica, unspecified chronicity   Rationale for Evaluation and Treatment: Rehabilitation  THERAPY DIAG:  Degeneration of intervertebral disc of lumbar region with discogenic back pain and lower extremity pain  Unstable gait  Muscle weakness (generalized)  Abnormal posture  Other low back pain  Osteopenia of multiple sites  ONSET DATE: 07/17/23  SUBJECTIVE:  SUBJECTIVE STATEMENT: Patient reports that she is doing okay, reports that the past 4 days she has had some HS pain feels very tight  PERTINENT HISTORY:  Per referring physician note: 18-month history of intermittent lower back pain on the right greater than left side. There is sometimes movement of the pain down through the back of the leg to the heel. Pain sometimes moves up the leg into the buttock as well. Denies provocative or alleviating factors. There are no saddle paresthesias. Denies weakness in her lower extremities. There is tingling sometimes in the right leg and foot. No bowel or bladder incontinence. She just finished physical therapy for gait stabilization.   PAIN:  Are you having  pain? Yes: NPRS scale: 5/10 Pain location: LBP, with T & N into R foot Pain description: cramping in calf and groin Aggravating factors: Bending/reaching to the floor Relieving factors: Standing still for a few minutes  PRECAUTIONS: Back, Fall, and Other: osteopenia  RED FLAGS: None   WEIGHT BEARING RESTRICTIONS: No  FALLS:  Has patient fallen in last 6 months? Yes. Number of falls 1  LIVING ENVIRONMENT: Lives with: lives alone Lives in: House/apartment Stairs: No Has following equipment at home: Single point cane  OCCUPATION: Work in retail 5 hrs in dressing room.   PLOF: Independent, sleeps in a recliner for many years.  PATIENT GOALS: Patient would like to get relief of her back pain and to manage it if she has a flare up.  NEXT MD VISIT: about 3 months  OBJECTIVE:  Note: Objective measures were completed at Evaluation unless otherwise noted.  DIAGNOSTIC FINDINGS:  Osteopenia of multiple sites - 08/23/22: DEXA scan left femoral neck -1.9, no comparison.  No history of a stress fracture.   X-rays showed multilevel spondylosis, L4-5 spondylolisthesis and facet joint arthropathy.  COGNITION: Overall cognitive status: Within functional limits for tasks assessed   SENSATION: Patient reports T and N into her R foot, intermittent, no known pattern of what causes it.   MUSCLE LENGTH: Hamstrings: Right 75 deg; Left 75 deg Thomas test: B hip flexor tightness at neutral ext.  POSTURE: rounded shoulders and anterior pelvic tilt  PALPATION: TTP B lumbar paraspinals, R QL, piriformis, gluts, ITB  LUMBAR ROM: WNL in all planes with mild pain at end of range  LOWER EXTREMITY ROM: L hip flexor tightness, felt in her back, R knee flexion limited due to pain, but > 120    LOWER EXTREMITY MMT:  BLE strength at least 4/5, but reports pain in back with hip motions.   LUMBAR SPECIAL TESTS:  Straight leg raise test: Negative, Slump test: Negative, and FABER test:  Negative  FUNCTIONAL TESTS:  5 times sit to stand: 17.24 OWESTRY 32%  GAIT: Distance walked: In clinic distances Assistive device utilized: None Level of assistance: Modified independence Comments: Patient moves slightly slowly, cautious, decreased B step length, increased lateral trunk sway.  TODAY'S TREATMENT:  DATE:  07/25/23 NuStep L3 x 5 minutes Deep STM to R gluts, piriformis, ITB F/B active stretch Supine stretching- SKTC, figure 4, glut stretch Supine strengthening- clamshells against G tband, pelvic tilt, bridge, x 5-10 each  07/26/23 Nustep level 5 x 5 minutes Seated row 10# 2x10 Lats 15# 2x10 Standing 5# straight arm pulls 2.5# hip abduction very difficult for her 2.5# hip extension Feet on ball K2C, rotation, small posterior activation and isometric abs HS and piriformis stretches, hip flexor stretches all in supine Green tband clam shells in hooklying Ball b/n knees squeeze STM with Tgun to the HS and hip flexors  07/18/23  Education   PATIENT EDUCATION:  Education details: POC Person educated: Patient Education method: Explanation Education comprehension: verbalized understanding  HOME EXERCISE PROGRAM: Previous [id number]   ASSESSMENT:  CLINICAL IMPRESSION: Patient is a 82 y.o. who was seen today for physical therapy evaluation and treatment for chronic LBP with R sciatica. Her symptoms are intermittent, with cramping and pain into RLE to her foot. She cannot determine exactly what causes the pain, but it is sometimes triggered when she reaches down to the floor. She demonstrates some proximal weakness, unable to perform a Single leg bridge on either side, as well as some tightness, particularly in hip flexors due to sleeping in a recliner for many years.  Provided deep STM to R gluts, piriformis, and ITB followed by stretching.  Then initiated Hep for stretching and strengthening of lower body. She was still quite sore after treatment, possibly due to reported increase in pain after lifitng up a package of kitty litter and feeling a strain. Will monitor her response to treatment today and tomorrow and adjust treatment activities accordingly.  OBJECTIVE IMPAIRMENTS: Abnormal gait, decreased activity tolerance, decreased balance, decreased endurance, difficulty walking, decreased ROM, decreased strength, postural dysfunction, and pain.   ACTIVITY LIMITATIONS: carrying, lifting, bending, sitting, squatting, stairs, transfers, and locomotion level  PARTICIPATION LIMITATIONS: meal prep, cleaning, laundry, driving, shopping, community activity, and occupation  PERSONAL FACTORS: Past/current experiences are also affecting patient's functional outcome.   REHAB POTENTIAL: Good  CLINICAL DECISION MAKING: Stable/uncomplicated  EVALUATION COMPLEXITY: Low   GOALS: Goals reviewed with patient? Yes  SHORT TERM GOALS: Target date: 08/02/23  I with initial HEP Baseline: Goal status: INITIAL  LONG TERM GOALS: Target date: 09/12/23  I with final HEP Baseline:  Goal status: INITIAL  2.  Improve OWESTRY by to 20% or less, indicative of min disability Baseline: 32% Goal status: INITIAL  3.  Patient will be able to walk at least 400' with LRAD, MI, including turns and reaching to simulate work conditions, no pain in back or RLE Baseline:  Goal status: INITIAL  4.  Patient will complete 5 x STS in < 12 sec Baseline: 17.24 Goal status: INITIAL  5.  Patient will return demonstration of appropriate lifting techniques for work. Baseline:  Goal status: INITIAL  PLAN:  PT FREQUENCY: 1x/week  PT DURATION: 8 weeks  PLANNED INTERVENTIONS: 97110-Therapeutic exercises, 97530- Therapeutic activity, [id number]- Neuromuscular re-education, 97535- Self Care, [id number]- Manual therapy, [id number]- Gait training, 97014- Electrical  stimulation (unattended), [phone]- Ionotophoresis 4mg /ml Dexamethasone, Patient/Family education, Dry Needling, Joint mobilization, Spinal mobilization, Cryotherapy, and Moist heat.  PLAN FOR NEXT SESSION: HEP, assess treatment  Oley Balm DPT 08/24/23 8:48 AM

## 2023-08-30 ENCOUNTER — Encounter: Payer: Self-pay | Admitting: Physical Therapy

## 2023-08-30 ENCOUNTER — Ambulatory Visit: Payer: Medicare Other | Admitting: Physical Therapy

## 2023-08-30 DIAGNOSIS — R2681 Unsteadiness on feet: Secondary | ICD-10-CM

## 2023-08-30 DIAGNOSIS — M8589 Other specified disorders of bone density and structure, multiple sites: Secondary | ICD-10-CM | POA: Diagnosis not present

## 2023-08-30 DIAGNOSIS — R293 Abnormal posture: Secondary | ICD-10-CM

## 2023-08-30 DIAGNOSIS — M5459 Other low back pain: Secondary | ICD-10-CM | POA: Diagnosis not present

## 2023-08-30 DIAGNOSIS — M51362 Other intervertebral disc degeneration, lumbar region with discogenic back pain and lower extremity pain: Secondary | ICD-10-CM

## 2023-08-30 DIAGNOSIS — M6281 Muscle weakness (generalized): Secondary | ICD-10-CM

## 2023-08-30 NOTE — Therapy (Signed)
OUTPATIENT PHYSICAL THERAPY THORACOLUMBAR TREATMENT   Patient Name: Kerry [name] MRN: [id number] DOB:1940-09-17, 82 y.o., female Today's Date: 08/30/2023  END OF SESSION:  PT End of Session - 08/30/23 1723     Visit Number 4    Date for PT Re-Evaluation 09/12/23    PT Start Time 1716    PT Stop Time 1755    PT Time Calculation (min) 39 min    Activity Tolerance Patient tolerated treatment well    Behavior During Therapy WFL for tasks assessed/performed            Past Medical History:  Diagnosis Date   Allergy    Anemia    in her 20's   Aortic insufficiency    i   Arthritis    not dx'd- just per pt    Asthma    AS A CHILD   Cancer (HCC)    endometrial cancer with hysterectomy in 1987   Cancer (HCC)    melanoma on face    Cataract    removed both eyes    DCM (dilated cardiomyopathy) (HCC)    EF 50-55%   GERD (gastroesophageal reflux disease)    occ has with diet    History of syncope    HTN (hypertension)    Hyperlipidemia    Mitral regurgitation    mild by echo 03/2023   OSA on CPAP    Pulmonary HTN (HCC)    PASP on echo 03/2023   TIA (transient ischemic attack)    Tricuspid regurgitation    moderate by echo 03/2023   Past Surgical History:  Procedure Laterality Date   ABDOMINAL HYSTERECTOMY     ANKLE FRACTURE SURGERY Left    APPENDECTOMY     CATARACT EXTRACTION, BILATERAL     CHOLECYSTECTOMY     COLONOSCOPY  08/01/2007   Dr Marina Goodell    DENTAL SURGERY     MELANOMA EXCISION     face   ovary removal     right leg  fracture surgery      SKIN BIOPSY     pre-cancerous on face   Patient Active Problem List   Diagnosis Date Noted   Abnormal PFT 06/15/2023   Tricuspid regurgitation    Mitral regurgitation    Pulmonary HTN (HCC)    Prediabetes 03/15/2023   History of dehydration 03/15/2023   Osteopenia 09/14/2022   Low back pain with sciatica 08/19/2022   Gait instability 08/19/2022   Hospital discharge follow-up 09/28/2021   Traumatic  ecchymosis of head 09/28/2021   Chronic post-traumatic headache, not intractable 09/28/2021   Subdural hematoma (HCC) 09/24/2021   Encephalopathy acute 09/24/2021   Normocytic anemia 09/14/2021   Seasonal allergic rhinitis due to pollen 03/11/2021   DCM (dilated cardiomyopathy) (HCC)    Hyponatremia 06/21/2019   TIA (transient ischemic attack) 06/20/2019   Aortic insufficiency 08/17/2018   Hyperlipidemia    Right thyroid nodule 02/07/2013   Right carotid bruit 07/16/2012   Ocular migraine 11/04/2011   Hypercholesterolemia 06/01/2011   Essential hypertension    History of syncope    Asthma     PCP: Mliss Sax, MD  REFERRING PROVIDER: Mliss Sax, MD  REFERRING DIAG: M54.41 (ICD-10-CM) - Right-sided low back pain with right-sided sciatica, unspecified chronicity   Rationale for Evaluation and Treatment: Rehabilitation  THERAPY DIAG:  Degeneration of intervertebral disc of lumbar region with discogenic back pain and lower extremity pain  Unstable gait  Muscle weakness (generalized)  Abnormal posture  Other  low back pain  ONSET DATE: 07/17/23  SUBJECTIVE:                                                                                                                                                                                           SUBJECTIVE STATEMENT: Patient reports that she raked leaves today and had to put them in a bin for removal, arrives limping. She says she was feeling. Overall she is feeling better overall.   PERTINENT HISTORY:  Per referring physician note: 52-month history of intermittent lower back pain on the right greater than left side. There is sometimes movement of the pain down through the back of the leg to the heel. Pain sometimes moves up the leg into the buttock as well. Denies provocative or alleviating factors. There are no saddle paresthesias. Denies weakness in her lower extremities. There is tingling sometimes in the  right leg and foot. No bowel or bladder incontinence. She just finished physical therapy for gait stabilization.   PAIN:  Are you having pain? Yes: NPRS scale: 5/10 Pain location: LBP, with T & N into R foot Pain description: cramping in calf and groin Aggravating factors: Bending/reaching to the floor Relieving factors: Standing still for a few minutes  PRECAUTIONS: Back, Fall, and Other: osteopenia  RED FLAGS: None   WEIGHT BEARING RESTRICTIONS: No  FALLS:  Has patient fallen in last 6 months? Yes. Number of falls 1  LIVING ENVIRONMENT: Lives with: lives alone Lives in: House/apartment Stairs: No Has following equipment at home: Single point cane  OCCUPATION: Work in retail 5 hrs in dressing room.   PLOF: Independent, sleeps in a recliner for many years.  PATIENT GOALS: Patient would like to get relief of her back pain and to manage it if she has a flare up.  NEXT MD VISIT: about 3 months  OBJECTIVE:  Note: Objective measures were completed at Evaluation unless otherwise noted.  DIAGNOSTIC FINDINGS:  Osteopenia of multiple sites - 08/23/22: DEXA scan left femoral neck -1.9, no comparison.  No history of a stress fracture.   X-rays showed multilevel spondylosis, L4-5 spondylolisthesis and facet joint arthropathy.  COGNITION: Overall cognitive status: Within functional limits for tasks assessed   SENSATION: Patient reports T and N into her R foot, intermittent, no known pattern of what causes it.   MUSCLE LENGTH: Hamstrings: Right 75 deg; Left 75 deg Thomas test: B hip flexor tightness at neutral ext.  POSTURE: rounded shoulders and anterior pelvic tilt  PALPATION: TTP B lumbar paraspinals, R QL, piriformis, gluts, ITB  LUMBAR ROM: WNL in all planes with mild pain at end of range  LOWER  EXTREMITY ROM: L hip flexor tightness, felt in her back, R knee flexion limited due to pain, but > 120    LOWER EXTREMITY MMT:  BLE strength at least 4/5, but reports pain in  back with hip motions.   LUMBAR SPECIAL TESTS:  Straight leg raise test: Negative, Slump test: Negative, and FABER test: Negative  FUNCTIONAL TESTS:  5 times sit to stand: 17.24 OWESTRY 32%  GAIT: Distance walked: In clinic distances Assistive device utilized: None Level of assistance: Modified independence Comments: Patient moves slightly slowly, cautious, decreased B step length, increased lateral trunk sway.  TODAY'S TREATMENT:                                                                                                                              DATE:  08/30/23 NuStep L4 x 5 min Deep STM to R ITB and lateral HS muscle with tightness and pain the length of the muscle, but not in the tendon. Medial HS, glut, piriformis not tight or TTP Stretch for R hamstring and ITB with strap Gentle strengthening for R HS with leg over Red physioball, knee flexion into the ball, hold x 5 sec. Seated resisted knee flex and ext against Red tband x 10 each  07/25/23 NuStep L3 x 5 minutes Deep STM to R gluts, piriformis, ITB F/B active stretch Supine stretching- SKTC, figure 4, glut stretch Supine strengthening- clamshells against G tband, pelvic tilt, bridge, x 5-10 each  07/26/23 Nustep level 5 x 5 minutes Seated row 10# 2x10 Lats 15# 2x10 Standing 5# straight arm pulls 2.5# hip abduction very difficult for her 2.5# hip extension Feet on ball K2C, rotation, small posterior activation and isometric abs HS and piriformis stretches, hip flexor stretches all in supine Green tband clam shells in hooklying Ball b/n knees squeeze STM with Tgun to the HS and hip flexors  07/18/23  Education   PATIENT EDUCATION:  Education details: POC Person educated: Patient Education method: Explanation Education comprehension: verbalized understanding  HOME EXERCISE PROGRAM: Previous [id number]   ASSESSMENT:  CLINICAL IMPRESSION: Patient is a 82 y.o. who was seen today for physical  therapy evaluation and treatment for chronic LBP with R sciatica. Her symptoms are intermittent, with cramping and pain into RLE to her foot. She cannot determine exactly what causes the pain, but it is sometimes triggered when she reaches down to the floor. She demonstrates some proximal weakness, unable to perform a Single leg bridge on either side, as well as some tightness, particularly in hip flexors due to sleeping in a recliner for many years.  Patient arrived limping. States she felt ok until she raked leaves today. R med HS and ITB both sore and tight. Provided some gentle STM and stretch, and then gentle HS activation, finishing with MH for pain relief. Updated HEP as well  OBJECTIVE IMPAIRMENTS: Abnormal gait, decreased activity tolerance, decreased balance, decreased endurance, difficulty walking, decreased ROM, decreased strength, postural dysfunction, and pain.  ACTIVITY LIMITATIONS: carrying, lifting, bending, sitting, squatting, stairs, transfers, and locomotion level  PARTICIPATION LIMITATIONS: meal prep, cleaning, laundry, driving, shopping, community activity, and occupation  PERSONAL FACTORS: Past/current experiences are also affecting patient's functional outcome.   REHAB POTENTIAL: Good  CLINICAL DECISION MAKING: Stable/uncomplicated  EVALUATION COMPLEXITY: Low   GOALS: Goals reviewed with patient? Yes  SHORT TERM GOALS: Target date: 08/02/23  I with initial HEP Baseline: Goal status: 08/30/23- met  LONG TERM GOALS: Target date: 09/12/23  I with final HEP Baseline:  Goal status: INITIAL  2.  Improve OWESTRY by to 20% or less, indicative of min disability Baseline: 32% Goal status: INITIAL  3.  Patient will be able to walk at least 400' with LRAD, MI, including turns and reaching to simulate work conditions, no pain in back or RLE Baseline:  Goal status: INITIAL  4.  Patient will complete 5 x STS in < 12 sec Baseline: 17.24 Goal status: INITIAL  5.   Patient will return demonstration of appropriate lifting techniques for work. Baseline:  Goal status: INITIAL  PLAN:  PT FREQUENCY: 1x/week  PT DURATION: 8 weeks  PLANNED INTERVENTIONS: 97110-Therapeutic exercises, 97530- Therapeutic activity, [id number]- Neuromuscular re-education, 97535- Self Care, 16109- Manual therapy, [id number]- Gait training, 97014- Electrical stimulation (unattended), [phone]- Ionotophoresis 4mg /ml Dexamethasone, Patient/Family education, Dry Needling, Joint mobilization, Spinal mobilization, Cryotherapy, and Moist heat.  PLAN FOR NEXT SESSION: HEP, assess treatment  Oley Balm DPT 08/30/23 5:54 PM

## 2023-09-11 DIAGNOSIS — Z5189 Encounter for other specified aftercare: Secondary | ICD-10-CM | POA: Diagnosis not present

## 2023-09-14 ENCOUNTER — Ambulatory Visit: Payer: Medicare Other | Attending: Family Medicine | Admitting: Physical Therapy

## 2023-09-14 ENCOUNTER — Encounter: Payer: Self-pay | Admitting: Physical Therapy

## 2023-09-14 DIAGNOSIS — R2681 Unsteadiness on feet: Secondary | ICD-10-CM | POA: Diagnosis not present

## 2023-09-14 DIAGNOSIS — M5459 Other low back pain: Secondary | ICD-10-CM | POA: Insufficient documentation

## 2023-09-14 DIAGNOSIS — R293 Abnormal posture: Secondary | ICD-10-CM | POA: Diagnosis not present

## 2023-09-14 DIAGNOSIS — M51362 Other intervertebral disc degeneration, lumbar region with discogenic back pain and lower extremity pain: Secondary | ICD-10-CM | POA: Insufficient documentation

## 2023-09-14 DIAGNOSIS — M6281 Muscle weakness (generalized): Secondary | ICD-10-CM | POA: Diagnosis not present

## 2023-09-14 NOTE — Therapy (Signed)
 OUTPATIENT PHYSICAL THERAPY THORACOLUMBAR TREATMENT   PHYSICAL THERAPY DISCHARGE SUMMARY  Visits from Start of Care: 5  Current functional level related to goals / functional outcomes: Progress toward all LTG   Remaining deficits: Chronic pain in back and hip that she manages with strength and stretch   Education / Equipment: HEP   Patient agrees to discharge. Patient goals were partially met. Patient is being discharged due to being pleased with the current functional level.  Patient Name: Kerry [name] MRN: [id number] DOB:05-Jan-1941, 83 y.o., female Today's Date: 09/14/2023  END OF SESSION:   Past Medical History:  Diagnosis Date   Allergy    Anemia    in her 65's   Aortic insufficiency    i   Arthritis    not dx'd- just per pt    Asthma    AS A CHILD   Cancer (HCC)    endometrial cancer with hysterectomy in 1987   Cancer (HCC)    melanoma on face    Cataract    removed both eyes    DCM (dilated cardiomyopathy) (HCC)    EF 50-55%   GERD (gastroesophageal reflux disease)    occ has with diet    History of syncope    HTN (hypertension)    Hyperlipidemia    Mitral regurgitation    mild by echo 03/2023   OSA on CPAP    Pulmonary HTN (HCC)    PASP on echo 03/2023   TIA (transient ischemic attack)    Tricuspid regurgitation    moderate by echo 03/2023   Past Surgical History:  Procedure Laterality Date   ABDOMINAL HYSTERECTOMY     ANKLE FRACTURE SURGERY Left    APPENDECTOMY     CATARACT EXTRACTION, BILATERAL     CHOLECYSTECTOMY     COLONOSCOPY  08/01/2007   Dr Abran    DENTAL SURGERY     MELANOMA EXCISION     face   ovary removal     right leg  fracture surgery      SKIN BIOPSY     pre-cancerous on face   Patient Active Problem List   Diagnosis Date Noted   Abnormal PFT 06/15/2023   Tricuspid regurgitation    Mitral regurgitation    Pulmonary HTN (HCC)    Prediabetes 03/15/2023   History of dehydration 03/15/2023   Osteopenia  09/14/2022   Low back pain with sciatica 08/19/2022   Gait instability 08/19/2022   Hospital discharge follow-up 09/28/2021   Traumatic ecchymosis of head 09/28/2021   Chronic post-traumatic headache, not intractable 09/28/2021   Subdural hematoma (HCC) 09/24/2021   Encephalopathy acute 09/24/2021   Normocytic anemia 09/14/2021   Seasonal allergic rhinitis due to pollen 03/11/2021   DCM (dilated cardiomyopathy) (HCC)    Hyponatremia 06/21/2019   TIA (transient ischemic attack) 06/20/2019   Aortic insufficiency 08/17/2018   Hyperlipidemia    Right thyroid  nodule 02/07/2013   Right carotid bruit 07/16/2012   Ocular migraine 11/04/2011   Hypercholesterolemia 06/01/2011   Essential hypertension    History of syncope    Asthma     PCP: Berneta Elsie Sayre, MD  REFERRING PROVIDER: Berneta Elsie Sayre, MD  REFERRING DIAG: M54.41 (ICD-10-CM) - Right-sided low back pain with right-sided sciatica, unspecified chronicity   Rationale for Evaluation and Treatment: Rehabilitation  THERAPY DIAG:  No diagnosis found.  ONSET DATE: 07/17/23  SUBJECTIVE:  SUBJECTIVE STATEMENT: Patient reports that she raked leaves today and had to put them in a bin for removal, arrives limping. She says she was feeling. Overall she is feeling better overall.   PERTINENT HISTORY:  Per referring physician note: 4-month history of intermittent lower back pain on the right greater than left side. There is sometimes movement of the pain down through the back of the leg to the heel. Pain sometimes moves up the leg into the buttock as well. Denies provocative or alleviating factors. There are no saddle paresthesias. Denies weakness in her lower extremities. There is tingling sometimes in the right leg and foot. No bowel or  bladder incontinence. She just finished physical therapy for gait stabilization.   PAIN:  Are you having pain? Yes: NPRS scale: 5/10 Pain location: LBP, with T & N into R foot Pain description: cramping in calf and groin Aggravating factors: Bending/reaching to the floor Relieving factors: Standing still for a few minutes  PRECAUTIONS: Back, Fall, and Other: osteopenia  RED FLAGS: None   WEIGHT BEARING RESTRICTIONS: No  FALLS:  Has patient fallen in last 6 months? Yes. Number of falls 1  LIVING ENVIRONMENT: Lives with: lives alone Lives in: House/apartment Stairs: No Has following equipment at home: Single point cane  OCCUPATION: Work in retail 5 hrs in dressing room.   PLOF: Independent, sleeps in a recliner for many years.  PATIENT GOALS: Patient would like to get relief of her back pain and to manage it if she has a flare up.  NEXT MD VISIT: about 3 months  OBJECTIVE:  Note: Objective measures were completed at Evaluation unless otherwise noted.  DIAGNOSTIC FINDINGS:  Osteopenia of multiple sites - 08/23/22: DEXA scan left femoral neck -1.9, no comparison.  No history of a stress fracture.   X-rays showed multilevel spondylosis, L4-5 spondylolisthesis and facet joint arthropathy.  COGNITION: Overall cognitive status: Within functional limits for tasks assessed   SENSATION: Patient reports T and N into her R foot, intermittent, no known pattern of what causes it.   MUSCLE LENGTH: Hamstrings: Right 75 deg; Left 75 deg Thomas test: B hip flexor tightness at neutral ext.  POSTURE: rounded shoulders and anterior pelvic tilt  PALPATION: TTP B lumbar paraspinals, R QL, piriformis, gluts, ITB  LUMBAR ROM: WNL in all planes with mild pain at end of range  LOWER EXTREMITY ROM: L hip flexor tightness, felt in her back, R knee flexion limited due to pain, but > 120    LOWER EXTREMITY MMT:  BLE strength at least 4/5, but reports pain in back with hip  motions.   LUMBAR SPECIAL TESTS:  Straight leg raise test: Negative, Slump test: Negative, and FABER test: Negative  FUNCTIONAL TESTS:  5 times sit to stand: 17.24 OWESTRY 32%  GAIT: Distance walked: In clinic distances Assistive device utilized: None Level of assistance: Modified independence Comments: Patient moves slightly slowly, cautious, decreased B step length, increased lateral trunk sway.  TODAY'S TREATMENT:  DATE:  09/14/23 Goals re-assessed for D/C Updated HEP with additional strengthening to be performed in sitting and standing  Ball squeeze Sit to stand Side to side stepping against G tband at knees Heel raises, hip; ext, marching, all with BUE support 10 of each exercise.   08/30/23 NuStep L4 x 5 min Deep STM to R ITB and lateral HS muscle with tightness and pain the length of the muscle, but not in the tendon. Medial HS, glut, piriformis not tight or TTP Stretch for R hamstring and ITB with strap Gentle strengthening for R HS with leg over Red physioball, knee flexion into the ball, hold x 5 sec. Seated resisted knee flex and ext against Red tband x 10 each  07/25/23 NuStep L3 x 5 minutes Deep STM to R gluts, piriformis, ITB F/B active stretch Supine stretching- SKTC, figure 4, glut stretch Supine strengthening- clamshells against G tband, pelvic tilt, bridge, x 5-10 each  07/26/23 Nustep level 5 x 5 minutes Seated row 10# 2x10 Lats 15# 2x10 Standing 5# straight arm pulls 2.5# hip abduction very difficult for her 2.5# hip extension Feet on ball K2C, rotation, small posterior activation and isometric abs HS and piriformis stretches, hip flexor stretches all in supine Green tband clam shells in hooklying Ball b/n knees squeeze STM with Tgun to the HS and hip flexors  07/18/23  Education   PATIENT EDUCATION:  Education details:  POC Person educated: Patient Education method: Explanation Education comprehension: verbalized understanding  HOME EXERCISE PROGRAM: Previous [id number]   ASSESSMENT:  CLINICAL IMPRESSION: Patient is a 83 y.o. who was seen today for physical therapy evaluation and treatment for chronic LBP with R sciatica. Her symptoms are intermittent, with cramping and pain into RLE to her foot. She cannot determine exactly what causes the pain, but it is sometimes triggered when she reaches down to the floor. She demonstrates some proximal weakness, unable to perform a Single leg bridge on either side, as well as some tightness, particularly in hip flexors due to sleeping in a recliner for many years.  Patient reports some improvement in her pain, has been very busy at work lately, limited ability to perform HEP, but she tries to incorporate some exercises at work. Goals were mostly met. Her HEP was updated to include some additional strengthening to build stability in her hips. She feels that with the stretching and strengthening exercises, she has the tools she needs to manage her pain at this point on her own.  OBJECTIVE IMPAIRMENTS: Abnormal gait, decreased activity tolerance, decreased balance, decreased endurance, difficulty walking, decreased ROM, decreased strength, postural dysfunction, and pain.   ACTIVITY LIMITATIONS: carrying, lifting, bending, sitting, squatting, stairs, transfers, and locomotion level  PARTICIPATION LIMITATIONS: meal prep, cleaning, laundry, driving, shopping, community activity, and occupation  PERSONAL FACTORS: Past/current experiences are also affecting patient's functional outcome.   REHAB POTENTIAL: Good  CLINICAL DECISION MAKING: Stable/uncomplicated  EVALUATION COMPLEXITY: Low   GOALS: Goals reviewed with patient? Yes  SHORT TERM GOALS: Target date: 08/02/23  I with initial HEP Baseline: Goal status: 08/30/23- met  LONG TERM GOALS: Target date:  09/12/23  I with final HEP Baseline:  Goal status: 09/14/23-met  2.  Improve OWESTRY by to 20% or less, indicative of min disability Baseline: 32% Goal status: 09/14/23-22%, partially met as she progressed significantly toward LTG  3.  Patient will be able to walk at least 400' with LRAD, MI, including turns and reaching to simulate work conditions, no pain in back or RLE Baseline:  Goal status: 09/14/23-met  4.  Patient will complete 5 x STS in < 12 sec Baseline: 17.24 Goal status: 09/14/23-11.25, met  5.  Patient will return demonstration of appropriate lifting techniques for work. Baseline:  Goal status: 09/14/23-met  PLAN:  PT FREQUENCY: 1x/week  PT DURATION: 8 weeks  PLANNED INTERVENTIONS: 97110-Therapeutic exercises, 97530- Therapeutic activity, [id number]- Neuromuscular re-education, 97535- Self Care, [id number]- Manual therapy, [id number]- Gait training, 97014- Electrical stimulation (unattended), [phone]- Ionotophoresis 4mg /ml Dexamethasone, Patient/Family education, Dry Needling, Joint mobilization, Spinal mobilization, Cryotherapy, and Moist heat.  PLAN FOR NEXT SESSION: HEP, assess treatment  Devere Mean DPT 09/14/23 1:02 PM

## 2023-09-18 ENCOUNTER — Ambulatory Visit: Payer: Medicare Other | Admitting: Family Medicine

## 2023-09-19 DIAGNOSIS — Z6835 Body mass index (BMI) 35.0-35.9, adult: Secondary | ICD-10-CM | POA: Diagnosis not present

## 2023-09-19 DIAGNOSIS — Z01419 Encounter for gynecological examination (general) (routine) without abnormal findings: Secondary | ICD-10-CM | POA: Diagnosis not present

## 2023-09-19 DIAGNOSIS — Z1231 Encounter for screening mammogram for malignant neoplasm of breast: Secondary | ICD-10-CM | POA: Diagnosis not present

## 2023-09-19 DIAGNOSIS — Z1272 Encounter for screening for malignant neoplasm of vagina: Secondary | ICD-10-CM | POA: Diagnosis not present

## 2023-09-23 ENCOUNTER — Other Ambulatory Visit: Payer: Self-pay | Admitting: Rheumatology

## 2023-09-23 DIAGNOSIS — M8589 Other specified disorders of bone density and structure, multiple sites: Secondary | ICD-10-CM

## 2023-09-25 ENCOUNTER — Ambulatory Visit (INDEPENDENT_AMBULATORY_CARE_PROVIDER_SITE_OTHER): Payer: Medicare Other | Admitting: Family Medicine

## 2023-09-25 ENCOUNTER — Encounter: Payer: Self-pay | Admitting: Family Medicine

## 2023-09-25 VITALS — BP 154/74 | HR 76 | Temp 97.2°F | Ht 63.0 in | Wt 194.2 lb

## 2023-09-25 DIAGNOSIS — H6992 Unspecified Eustachian tube disorder, left ear: Secondary | ICD-10-CM | POA: Insufficient documentation

## 2023-09-25 DIAGNOSIS — I10 Essential (primary) hypertension: Secondary | ICD-10-CM

## 2023-09-25 MED ORDER — FLUTICASONE PROPIONATE 50 MCG/ACT NA SUSP
2.0000 | Freq: Every day | NASAL | 6 refills | Status: DC
Start: 2023-09-25 — End: 2024-04-18

## 2023-09-25 NOTE — Telephone Encounter (Signed)
 Last Fill: 04/11/2023  Labs: 08/15/2023 RBC 3.85, Hct 35.7, 03/15/2023 Sodium 129, Chloride 94  Next Visit: 01/02/2024  Last Visit: 07/12/2023  DX: Osteopenia of multiple sites   Current Dose per office note 07/12/2023: Fosamax  70 mg p.o. weekly started on April 11, 2023   Okay to refill Fosamax ?

## 2023-09-25 NOTE — Progress Notes (Signed)
 Established Patient Office Visit   Subjective:  Patient ID: Kerry  Kerry [name], female    DOB: 1940-12-08  Age: 83 y.o. MRN: [id number]  Chief Complaint  Patient presents with   Medical Management of Chronic Issues    3 month follow up. Pt is not fasting. Benign skin removal of forehead. Elevated home BP readings.     HPI Encounter Diagnoses  Name Primary?   Hypertension, uncontrolled Yes   Dysfunction of left eustachian tube    Blood pressure at home has been higher recently.  She is compliant with all 3 of her medications.  Admits to increased sodium in the diet with the holidays and going out to eat more often.  Recent echo showed low normal left ventricular function.  Left ear feels congested.   Review of Systems  Constitutional: Negative.   HENT: Negative.    Eyes:  Negative for blurred vision, discharge and redness.  Respiratory: Negative.    Cardiovascular: Negative.   Gastrointestinal:  Negative for abdominal pain.  Genitourinary: Negative.   Musculoskeletal: Negative.  Negative for myalgias.  Skin:  Negative for rash.  Neurological:  Negative for tingling, loss of consciousness and weakness.  Endo/Heme/Allergies:  Negative for polydipsia.     Current Outpatient Medications:    acetaminophen  (TYLENOL ) 500 MG tablet, Take 500 mg by mouth every 6 (six) hours as needed for moderate pain or headache. , Disp: , Rfl:    alendronate  (FOSAMAX ) 70 MG tablet, Take 1 tablet (70 mg total) by mouth once a week. Take with a full glass of water on an empty stomach., Disp: 12 tablet, Rfl: 1   Ascorbic Acid (VITAMIN C PO), Take 1 tablet by mouth daily., Disp: , Rfl:    aspirin  81 MG chewable tablet, Chew 81 mg by mouth every other day., Disp: , Rfl:    atorvastatin  (LIPITOR) 20 MG tablet, TAKE 1 TABLET(20 MG) BY MOUTH DAILY, Disp: 90 tablet, Rfl: 2   carvedilol  (COREG ) 12.5 MG tablet, TAKE 1 TABLET(12.5 MG) BY MOUTH TWICE DAILY. KEEP APPOINTMENT FOR FURTHER REFILLS, Disp: 180 tablet,  Rfl: 3   Cholecalciferol (VITAMIN D3) 2000 units capsule, Take 2,000 Units by mouth daily., Disp: , Rfl:    Coenzyme Q10-Vitamin E (QUNOL ULTRA COQ10 PO), Take 1 capsule by mouth daily., Disp: , Rfl:    Cyanocobalamin  500 MCG CHEW, Take one daily, Disp: 90 tablet, Rfl: 1   fexofenadine (ALLEGRA) 180 MG tablet, Take 180 mg by mouth every morning., Disp: , Rfl:    fluticasone  (FLONASE ) 50 MCG/ACT nasal spray, Place 2 sprays into both nostrils daily., Disp: 16 g, Rfl: 6   hydrochlorothiazide  (HYDRODIURIL ) 25 MG tablet, Take 1 tablet (25 mg total) by mouth daily., Disp: 90 tablet, Rfl: 3   lisinopril  (ZESTRIL ) 20 MG tablet, Take 1 tablet (20 mg total) by mouth daily., Disp: 90 tablet, Rfl: 3   Misc Natural Products (NEURIVA PO), Take 1 tablet by mouth daily., Disp: , Rfl:    Multiple Vitamin (MULTIVITAMIN WITH MINERALS) TABS tablet, Take 1 tablet by mouth daily., Disp: , Rfl:    Polyethyl Glycol-Propyl Glycol (SYSTANE) 0.4-0.3 % SOLN, Place 1 drop into both eyes daily as needed (dry eyes)., Disp: , Rfl:    Objective:     BP (!) 154/74   Pulse 76   Temp (!) 97.2 F (36.2 C)   Ht 5' 3 (1.6 m)   Wt 194 lb 3.2 oz (88.1 kg)   SpO2 96%   BMI 34.40 kg/m  BP  Readings from Last 3 Encounters:  09/25/23 (!) 154/74  08/15/23 134/68  07/12/23 (!) 156/71   Wt Readings from Last 3 Encounters:  09/25/23 194 lb 3.2 oz (88.1 kg)  08/15/23 190 lb (86.2 kg)  07/12/23 190 lb (86.2 kg)      Physical Exam Constitutional:      General: She is not in acute distress.    Appearance: Normal appearance. She is not ill-appearing, toxic-appearing or diaphoretic.  HENT:     Head: Normocephalic and atraumatic.     Right Ear: External ear normal.     Left Ear: External ear normal.  No middle ear effusion. Tympanic membrane is retracted. Tympanic membrane is not erythematous.     Mouth/Throat:     Mouth: Mucous membranes are moist.     Pharynx: Oropharynx is clear. No oropharyngeal exudate or posterior  oropharyngeal erythema.  Eyes:     General: No scleral icterus.       Right eye: No discharge.        Left eye: No discharge.     Extraocular Movements: Extraocular movements intact.     Conjunctiva/sclera: Conjunctivae normal.     Pupils: Pupils are equal, round, and reactive to light.  Cardiovascular:     Rate and Rhythm: Normal rate and regular rhythm.  Pulmonary:     Effort: Pulmonary effort is normal. No respiratory distress.     Breath sounds: Normal breath sounds. No wheezing, rhonchi or rales.  Musculoskeletal:        General: Swelling present.     Cervical back: No rigidity or tenderness.     Right lower leg: No edema.     Left lower leg: No edema.  Skin:    General: Skin is warm and dry.  Neurological:     Mental Status: She is alert and oriented to person, place, and time.  Psychiatric:        Mood and Affect: Mood normal.        Behavior: Behavior normal.      No results found for any visits on 09/25/23.    The ASCVD Risk score (Arnett DK, et al., 2019) failed to calculate for the following reasons:   The 2019 ASCVD risk score is only valid for ages 80 to 20   Risk score cannot be calculated because patient has a medical history suggesting prior/existing ASCVD    Assessment & Plan:   Hypertension, uncontrolled  Dysfunction of left eustachian tube -     Fluticasone  Propionate; Place 2 sprays into both nostrils daily.  Dispense: 16 g; Refill: 6    Return in about 6 weeks (around 11/06/2023), or Please decrease sodium in your diet..  Information was given on managing hypertension as well as eustachian tube dysfunction.  Will start Flonase  for ETD.  Be sure to take all blood pressure medications as directed.  Kerry Sim Lent, MD

## 2023-09-26 ENCOUNTER — Encounter: Payer: Self-pay | Admitting: Cardiology

## 2023-09-26 ENCOUNTER — Ambulatory Visit: Payer: Medicare Other | Attending: Cardiology | Admitting: Cardiology

## 2023-09-26 VITALS — BP 136/68 | HR 70 | Ht 63.0 in | Wt 197.0 lb

## 2023-09-26 DIAGNOSIS — Z87898 Personal history of other specified conditions: Secondary | ICD-10-CM | POA: Diagnosis not present

## 2023-09-26 DIAGNOSIS — E7800 Pure hypercholesterolemia, unspecified: Secondary | ICD-10-CM

## 2023-09-26 DIAGNOSIS — I2510 Atherosclerotic heart disease of native coronary artery without angina pectoris: Secondary | ICD-10-CM

## 2023-09-26 DIAGNOSIS — I10 Essential (primary) hypertension: Secondary | ICD-10-CM | POA: Diagnosis not present

## 2023-09-26 DIAGNOSIS — I4719 Other supraventricular tachycardia: Secondary | ICD-10-CM

## 2023-09-26 DIAGNOSIS — I420 Dilated cardiomyopathy: Secondary | ICD-10-CM

## 2023-09-26 DIAGNOSIS — I351 Nonrheumatic aortic (valve) insufficiency: Secondary | ICD-10-CM | POA: Diagnosis not present

## 2023-09-26 NOTE — Patient Instructions (Signed)
 Medication Instructions:  Your physician recommends that you continue on your current medications as directed. Please refer to the Current Medication list given to you today.  *If you need a refill on your cardiac medications before your next appointment, please call your pharmacy*   Lab Work: None.  If you have labs (blood work) drawn today and your tests are completely normal, you will receive your results only by: MyChart Message (if you have MyChart) OR A paper copy in the mail If you have any lab test that is abnormal or we need to change your treatment, we will call you to review the results.   Testing/Procedures: Your physician has requested that you have an echocardiogram in July 2025. Echocardiography is a painless test that uses sound waves to create images of your heart. It provides your doctor with information about the size and shape of your heart and how well your heart's chambers and valves are working. This procedure takes approximately one hour. There are no restrictions for this procedure. Please do NOT wear cologne, perfume, aftershave, or lotions (deodorant is allowed). Please arrive 15 minutes prior to your appointment time.  Please note: We ask at that you not bring children with you during ultrasound (echo/ vascular) testing. Due to room size and safety concerns, children are not allowed in the ultrasound rooms during exams. Our front office staff cannot provide observation of children in our lobby area while testing is being conducted. An adult accompanying a patient to their appointment will only be allowed in the ultrasound room at the discretion of the ultrasound technician under special circumstances. We apologize for any inconvenience.    Follow-Up: At [facility], you and your health needs are our priority.  As part of our continuing mission to provide you with exceptional heart care, we have created designated Provider Care Teams.  These Care Teams  include your primary Cardiologist (physician) and Advanced Practice Providers (APPs -  Physician Assistants and Nurse Practitioners) who all work together to provide you with the care you need, when you need it.  We recommend signing up for the patient portal called MyChart.  Sign up information is provided on this After Visit Summary.  MyChart is used to connect with patients for Virtual Visits (Telemedicine).  Patients are able to view lab/test results, encounter notes, upcoming appointments, etc.  Non-urgent messages can be sent to your provider as well.   To learn more about what you can do with MyChart, go to [email or web address].    Your next appointment:   1 year(s)  Provider:   Dr. Wilbert Bihari, MD

## 2023-09-26 NOTE — Progress Notes (Signed)
 Cardiology Office Note:    Date:  09/26/2023   ID:  Kerry [name], DOB Apr 07, 1941, MRN [id number]  PCP:  Kerry Elsie Sayre, MD  Cardiologist:  None    Referring MD: Kerry Rush, MD   Chief Complaint  Patient presents with   Coronary Artery Disease   Hypertension   Hyperlipidemia   Cardiomyopathy   Aortic Insuffiency    History of Present Illness:    Kerry  F [name] is a 83 y.o. female with a hx of HTN, syncope and hyperlipidemia (statin intolerant). She also has a hx of CVA and loop recorder was recommended but patient did not want to proceed.   2D echo at that time showed mild LV dysfunction with EF 45-50% which was new and mild AS/AI.   Repeat 2D echo to 2022 showed EF 50 to 55% with G1 DD, moderate left atrial enlargement, trivial MR and mild AR.  She is here today for followup and is doing well.  She denies any chest pain or pressure, SOB, DOE, PND, orthopnea, dizziness or syncope. TShe has had some mild LE edema at times. The other day she had some racing of her heart but only for a few seconds. She has been having some numbness and tingling in her legs but LE ABIs last summer were normal. She is compliant with her meds and is tolerating meds with no SE.    Past Medical History:  Diagnosis Date   Allergy    Anemia    in her 20's   Aortic insufficiency    i   Arthritis    not dx'd- just per pt    Asthma    AS A CHILD   Cancer (HCC)    endometrial cancer with hysterectomy in 1987   Cancer (HCC)    melanoma on face    Cataract    removed both eyes    DCM (dilated cardiomyopathy) (HCC)    EF 50-55%   GERD (gastroesophageal reflux disease)    occ has with diet    History of syncope    HTN (hypertension)    Hyperlipidemia    Mitral regurgitation    mild by echo 03/2023   OSA on CPAP    Pulmonary HTN (HCC)    PASP on echo 03/2023   TIA (transient ischemic attack)    Tricuspid regurgitation    moderate by echo 03/2023    Past Surgical History:   Procedure Laterality Date   ABDOMINAL HYSTERECTOMY     ANKLE FRACTURE SURGERY Left    APPENDECTOMY     CATARACT EXTRACTION, BILATERAL     CHOLECYSTECTOMY     COLONOSCOPY  08/01/2007   Dr Abran    DENTAL SURGERY     MELANOMA EXCISION     face   ovary removal     right leg  fracture surgery      SKIN BIOPSY     pre-cancerous on face    Current Medications: Current Meds  Medication Sig   acetaminophen  (TYLENOL ) 500 MG tablet Take 500 mg by mouth every 6 (six) hours as needed for moderate pain or headache.    alendronate  (FOSAMAX ) 70 MG tablet TAKE 1 TABLET(70 MG) BY MOUTH 1 TIME A WEEK WITH A FULL GLASS OF WATER AND ON AN EMPTY STOMACH   Ascorbic Acid (VITAMIN C PO) Take 1 tablet by mouth daily.   aspirin  81 MG chewable tablet Chew 81 mg by mouth every other day.   atorvastatin  (LIPITOR) 20 MG  tablet TAKE 1 TABLET(20 MG) BY MOUTH DAILY   carvedilol  (COREG ) 12.5 MG tablet TAKE 1 TABLET(12.5 MG) BY MOUTH TWICE DAILY. KEEP APPOINTMENT FOR FURTHER REFILLS   Cholecalciferol (VITAMIN D3) 2000 units capsule Take 2,000 Units by mouth daily.   Coenzyme Q10-Vitamin E (QUNOL ULTRA COQ10 PO) Take 1 capsule by mouth daily.   Cyanocobalamin  500 MCG CHEW Take one daily   fexofenadine (ALLEGRA) 180 MG tablet Take 180 mg by mouth every morning.   fluticasone  (FLONASE ) 50 MCG/ACT nasal spray Place 2 sprays into both nostrils daily.   hydrochlorothiazide  (HYDRODIURIL ) 25 MG tablet Take 1 tablet (25 mg total) by mouth daily.   lisinopril  (ZESTRIL ) 20 MG tablet Take 1 tablet (20 mg total) by mouth daily.   Misc Natural Products (NEURIVA PO) Take 1 tablet by mouth daily.   Multiple Vitamin (MULTIVITAMIN WITH MINERALS) TABS tablet Take 1 tablet by mouth daily.   Polyethyl Glycol-Propyl Glycol (SYSTANE) 0.4-0.3 % SOLN Place 1 drop into both eyes daily as needed (dry eyes).     Allergies:   Lovastatin and Sudafed [pseudoephedrine hcl]   Social History   Socioeconomic History   Marital status:  Widowed    Spouse name: Not on file   Number of children: 2   Years of education: Not on file   Highest education level: Not on file  Occupational History   Not on file  Tobacco Use   Smoking status: Never    Passive exposure: Past   Smokeless tobacco: Never  Vaping Use   Vaping status: Never Used  Substance and Sexual Activity   Alcohol use: No   Drug use: No   Sexual activity: Not Currently  Other Topics Concern   Not on file  Social History Narrative   Lives at home alone   1 cup of coffee per day    Works at [employer] Strain: Low Risk  (06/19/2023)   Overall Financial Resource Strain (CARDIA)    Difficulty of Paying Living Expenses: Not hard at all  Food Insecurity: No Food Insecurity (06/19/2023)   Hunger Vital Sign    Worried About Running Out of Food in the Last Year: Never true    Ran Out of Food in the Last Year: Never true  Transportation Needs: No Transportation Needs (06/19/2023)   PRAPARE - Administrator, Civil Service (Medical): No    Lack of Transportation (Non-Medical): No  Physical Activity: Insufficiently Active (06/19/2023)   Exercise Vital Sign    Days of Exercise per Week: 7 days    Minutes of Exercise per Session: 10 min  Stress: No Stress Concern Present (06/19/2023)   [employer] of Occupational Health - Occupational Stress Questionnaire    Feeling of Stress : Not at all  Social Connections: Moderately Isolated (06/19/2023)   Social Connection and Isolation Panel [NHANES]    Frequency of Communication with Friends and Family: Twice a week    Frequency of Social Gatherings with Friends and Family: More than three times a week    Attends Religious Services: More than 4 times per year    Active Member of [employer] or Organizations: No    Attends Banker Meetings: Never    Marital Status: Widowed     Family History: The patient's family history includes Dementia in  her father and sister; Diabetes in her mother, paternal grandmother, and sister; Heart disease in her mother; Hyperlipidemia in her mother;  Hypertension in her mother; Kidney failure in her mother; Mitral valve prolapse in her daughter; Multiple sclerosis in her sister; Stomach cancer in her sister; Throat cancer in her brother; Uterine cancer in her mother. There is no history of Colon cancer, Colon polyps, Esophageal cancer, or Rectal cancer.  ROS:   Please see the history of present illness.    ROS  All other systems reviewed and negative.   EKGs/Labs/Other Studies Reviewed:    The following studies were reviewed today:  Recent Labs: 03/15/2023: ALT 16 03/21/2023: TSH 2.24 04/28/2023: BUN 28; Creat 0.84; Potassium 5.0; Sodium 138 08/15/2023: Hemoglobin 12.0; Platelets 265.0; Pro B Natriuretic peptide (BNP) 186.0   Recent Lipid Panel    Component Value Date/Time   CHOL 135 03/15/2023 0843   CHOL 143 07/10/2020 1209   TRIG 90.0 03/15/2023 0843   HDL 54.40 03/15/2023 0843   HDL 65 07/10/2020 1209   CHOLHDL 2 03/15/2023 0843   VLDL 18.0 03/15/2023 0843   LDLCALC 62 03/15/2023 0843   LDLCALC 65 07/10/2020 1209   LDLDIRECT 64.0 09/23/2021 0757    Physical Exam:    VS:  BP 136/68   Pulse 70   Ht 5' 3 (1.6 m)   Wt 197 lb (89.4 kg)   SpO2 97%   BMI 34.90 kg/m     Wt Readings from Last 3 Encounters:  09/26/23 197 lb (89.4 kg)  09/25/23 194 lb 3.2 oz (88.1 kg)  08/15/23 190 lb (86.2 kg)    GEN: Well nourished, well developed in no acute distress HEENT: Normal NECK: No JVD; No carotid bruits LYMPHATICS: No lymphadenopathy CARDIAC:RRR, no murmurs, rubs, gallops RESPIRATORY:  Clear to auscultation without rales, wheezing or rhonchi  ABDOMEN: Soft, non-tender, non-distended MUSCULOSKELETAL:  trace edema; No deformity  SKIN: Warm and dry NEUROLOGIC:  Alert and oriented x 3 PSYCHIATRIC:  Normal affect   ASSESSMENT:    1. Essential hypertension   2. History of syncope   3.  Pure hypercholesterolemia   4. Nonrheumatic aortic valve insufficiency   5. DCM (dilated cardiomyopathy) (HCC)   6. Coronary artery disease involving native coronary artery of native heart without angina pectoris   7. PAT (paroxysmal atrial tachycardia) (HCC)     PLAN:    In order of problems listed above:  1.  HTN  -BP controlled on exam today -Continue drug management with carvedilol  12.5 mg twice daily, HCTZ 25 mg daily, Zestril  20 mg daily with as needed refills -I have personally reviewed and interpreted outside labs performed by patient's PCP which showed serum creatinine 0.84 and potassium 5 on 04/28/2023  2.  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  Hyperlipidemia -LDL goal less than 70  -Continue prescription management with atorvastatin  20 mg daily with as needed refills -I have personally reviewed and interpreted outside labs performed by patient's PCP which showed LDL 62 and HDL 54 ALT 16 on 03/15/2023  4.  Aortic insufficiency -2D echo 08/2019 showed moderate AI and mild AS -Repeat 2D echo 10/20/2020 showed mild AI with  no aortic stenosis -Repeat 2D echo 03/31/2023 showed mild to moderate AR and no aortic stenosis -Repeat 2D echo 03/31/2024  5.  Mild LV dysfunction -EF noted to be mildly decreased at 45-50% in October at time of her CVA and by repet echo 08/2019 -Repeat echo 10/20/2020 showed low normal LV function with EF 50 to 55% -Repeat echo 03/31/2023 with EF 50 to 55% with mild RVE and mild PASP 40.7 mmHg  6.   ASCAD -she has chronic DOE that is not felt to be related to CAD -Coronary CTA 10/2019 showed a coronary Ca score of 37 which was 40th % for age and sex matched controls and mild non obstructive plaque in the proximal LAD and RCA -She has not had any anginal symptoms since I saw her last -Continue prescription drug management with aspirin  81 mg daily, atorvastatin  20 mg daily, carvedilol  12.5 mg twice daily with as needed refills  7.  Nonsustained atrial tachycardia -She is had any palpitations since I saw her last -Continue drug managed with carvedilol  12.5 mg twice daily with as needed refills  8.  Claudication -symptoms have been vague and do not always occur with exertion in her legs -Lower extremity ABIs 04/04/2023 were normal  Medication Adjustments/Labs and Tests Ordered: Current medicines are reviewed at length with the patient today.  Concerns regarding medicines are outlined above.  No orders of the defined types were placed in this encounter.  No orders of the defined types were placed in this encounter.   Signed, Wilbert Bihari, MD  09/26/2023 11:15 AM    [facility]

## 2023-09-26 NOTE — Addendum Note (Signed)
 Addended by: Luellen Pucker on: 09/26/2023 11:26 AM   Modules accepted: Orders

## 2023-10-02 ENCOUNTER — Ambulatory Visit (HOSPITAL_COMMUNITY)
Admission: RE | Admit: 2023-10-02 | Discharge: 2023-10-02 | Disposition: A | Discharge status: Home / Self Care | Payer: Medicare Other | Source: Ambulatory Visit | Attending: Internal Medicine | Admitting: Internal Medicine

## 2023-10-02 DIAGNOSIS — K8689 Other specified diseases of pancreas: Secondary | ICD-10-CM | POA: Diagnosis not present

## 2023-10-02 DIAGNOSIS — E041 Nontoxic single thyroid nodule: Secondary | ICD-10-CM | POA: Diagnosis not present

## 2023-10-02 DIAGNOSIS — R0609 Other forms of dyspnea: Secondary | ICD-10-CM | POA: Insufficient documentation

## 2023-10-02 DIAGNOSIS — R918 Other nonspecific abnormal finding of lung field: Secondary | ICD-10-CM | POA: Diagnosis not present

## 2023-10-02 DIAGNOSIS — R942 Abnormal results of pulmonary function studies: Secondary | ICD-10-CM | POA: Diagnosis not present

## 2023-10-02 DIAGNOSIS — R053 Chronic cough: Secondary | ICD-10-CM | POA: Insufficient documentation

## 2023-10-05 ENCOUNTER — Ambulatory Visit: Payer: Medicare Other | Admitting: Internal Medicine

## 2023-10-05 ENCOUNTER — Encounter: Payer: Self-pay | Admitting: Internal Medicine

## 2023-10-05 VITALS — BP 152/76 | HR 75 | Ht 63.0 in | Wt 190.0 lb

## 2023-10-05 DIAGNOSIS — R0989 Other specified symptoms and signs involving the circulatory and respiratory systems: Secondary | ICD-10-CM | POA: Diagnosis not present

## 2023-10-05 DIAGNOSIS — R1310 Dysphagia, unspecified: Secondary | ICD-10-CM

## 2023-10-05 DIAGNOSIS — K449 Diaphragmatic hernia without obstruction or gangrene: Secondary | ICD-10-CM

## 2023-10-05 DIAGNOSIS — J45909 Unspecified asthma, uncomplicated: Secondary | ICD-10-CM | POA: Diagnosis not present

## 2023-10-05 DIAGNOSIS — R053 Chronic cough: Secondary | ICD-10-CM | POA: Diagnosis not present

## 2023-10-05 DIAGNOSIS — K862 Cyst of pancreas: Secondary | ICD-10-CM

## 2023-10-05 DIAGNOSIS — Z9109 Other allergy status, other than to drugs and biological substances: Secondary | ICD-10-CM | POA: Diagnosis not present

## 2023-10-05 DIAGNOSIS — E041 Nontoxic single thyroid nodule: Secondary | ICD-10-CM

## 2023-10-05 DIAGNOSIS — R0609 Other forms of dyspnea: Secondary | ICD-10-CM

## 2023-10-05 DIAGNOSIS — K2289 Other specified disease of esophagus: Secondary | ICD-10-CM

## 2023-10-05 NOTE — Progress Notes (Signed)
OV 08/15/2023  Subjective:  Patient ID: Kerry [name], female , DOB: 05/04/1941 , age 83 y.o. , MRN: [id number] , ADDRESS: [address] [name] [address] PCP Mliss Sax, MD Patient Care Team: Mliss Sax, MD as PCP - General (Family Medicine) Richardean Chimera, MD as Consulting Physician (Obstetrics and Gynecology) Quintella Reichert, MD as Consulting Physician (Cardiology) [facility]  This Provider for this visit: Treatment Team:  Attending Provider: [name] Shan, MD    08/15/2023 -   Chief Complaint  Patient presents with   Consult    IDS, having sob when talking, and sob when rushing , not able walk for exercise like she use too.      HPI [name] 83 y.o. -83 year old widow.  She lives alone.  She is originally from [place].  She joined her husband here in 1984.  And then she relocated back to [place] in 1993 and then relocated back to [place] in 2000.  Shortly after she relocated here her husband was a heavy smoker developed lung cancer and passed away.  Since then she has been living alone she has 2 children one of them lives in [place] the other is relocated back to [place].  She tells me she had asthma as a child but she outgrew that and she has been on no medications for 30 years.  She only deals with some seasonal allergies for which she takes over-the-counter antihistamine as needed.  Current problems shortness of breath for the last 5 years.  Recently pulmonary function test was abnormal [restriction with low DLCO] and therefore referred here.  She says it is progressive.  She states when she walks around for a while or sometimes when she takes a shower and changes close she will feel short of breath.  Associated with that she also has a cough for the last 5 years.  The cough is much milder and its only that once in a while.  This is 1-2 times a month.  She does bring some green sputum  with it.  There is no wheezing with this.  However exhaled nitric oxide today slightly elevated 40 ppb.  She does have childhood history of asthma.  She does not have any orthopnea but she does sleep in a recliner because of her back issues.  She is been doing this for 18 months.  She does not know if she snores because she lives alone.  She is not smoker but I believe she was passively smoking up until 24 years ago when her husband died from lung cancer.   Cardiac workup reveals -2019 cardiac stress test with a low risk -July 2024 cardiac echo which is stable with EF 50-55% grade 1 diastolic dysfunction and elevated pulmonary pressures  Other -Normal autoimmune workup in 2024 -Normal kidney function August 2024 -Mild anemia in July 2024 which is chronic and 11 g% approximate   CT Chest data from date:  10/21/19 CT CORONARIES  - personally visualized and independently interpreted : yes - my findings are: agree Narrative & Impression  EXAM: OVER-READ INTERPRETATION  CT CHEST   The following report is an over-read performed by radiologist Dr. Irish Lack of [facility] on 10/21/2019. This over-read does not include interpretation of cardiac or coronary anatomy or pathology. The coronary CTA interpretation by the cardiologist is attached.   COMPARISON:  None.   FINDINGS: Vascular: No incidental findings.   Mediastinum/Nodes: Visualized mediastinum and hilar regions  demonstrate no masses or lymphadenopathy. There is a moderate-sized hiatal hernia.   Lungs/Pleura: Visualized lungs show no evidence of pulmonary edema, consolidation, pneumothorax, nodule or pleural fluid.   Upper Abdomen: No acute abnormality.   Musculoskeletal: No chest wall mass or suspicious bone lesions identified.   IMPRESSION: Moderate hiatal hernia.   Electronically Signed: By: Irish Lack M.D. On: 10/21/2019 08:21     OV 10/05/2023  Subjective:  Patient ID: Kerry [name],  female , DOB: [birth year] , age 56 y.o. , MRN: [id number] , ADDRESS: [address] [name] [address] PCP Mliss Sax, MD Patient Care Team: Mliss Sax, MD as PCP - General (Family Medicine) Richardean Chimera, MD as Consulting Physician (Obstetrics and Gynecology) Quintella Reichert, MD as Consulting Physician (Cardiology) [facility]  This Provider for this visit: Treatment Team:  Attending Provider: [name] Shan, MD    10/05/2023 -   Chief Complaint  Patient presents with   Follow-up    Pt states she is feeling the same, sob during exertion     HPI [place] y.o. -returns for follow-up.  She tells me that her shortness of breath is still ongoing.  She does not recall [place] giving the Trelegy and I do not know if he actually gave it to her.  Her cough is not that significant but her shortness of breath is more significant.  The cough is only occasional.  Shortness with this present with exertion such as climbing a flight of stairs or going up the hill.  She does use a cane she is obese she is physically deconditioned.  She is also on Coreg and Zestril.  Nevertheless workup does show that her lung parenchyma is clean but she does have air trapping.  Her blood allergy test is positive for dust mites and cat dander.  She does have a cat.  Her pulmonary function test shows mild restriction.  Of note her Gardiner Fanti was borderline elevated last time.  Her BNP slightly elevated at 186.  An echo last July showed diastolic dysfunction grade 1.   Of note her CT chest shows multiple incidental findings that include esophageal thickening.  She is now reporting dysphagia for solids intermittently occasionally once a week for the last 1 month.  She also has punctate renal stone.  She has a thyroid nodule.  She has pancreatic cyst.  I shared all these results with her.  Simple office walk 224 (66+46 x 2) feet Pod A at [facility] x  3 laps goal with  forehead probe 08/15/2023    O2 used ra   Number laps completed Sit stand x 10   Comments about pace normal   Resting Pulse Ox/HR 98% and 77/min   Final Pulse Ox/HR 97% and 87/min   Desaturated </= 88% no   Desaturated <= 3% points no   Got Tachycardic >/= 90/min no   Symptoms at end of test Mild dyspnea   Miscellaneous comments x      PFT.      Latest Ref Rng & Units 05/29/2023    7:45 AM  PFT Results  FVC-Pre L 1.87   FVC-Predicted Pre % 76   FVC-Post L 1.97   FVC-Predicted Post % 80   Pre FEV1/FVC % % 77   Post FEV1/FCV % % 77   FEV1-Pre L 1.45   FEV1-Predicted Pre % 79   FEV1-Post L 1.51   DLCO uncorrected ml/min/mmHg 9.88   DLCO UNC% %  54   DLVA Predicted % 73   TLC L 4.43   TLC % Predicted % 90   RV % Predicted % 105       LAB RESULTS last 96 hours CT Chest High Resolution Result Date: 10/04/2023 CLINICAL DATA:  Shortness of breath on exertion. EXAM: CT CHEST WITHOUT CONTRAST TECHNIQUE: Multidetector CT imaging of the chest was performed following the standard protocol without intravenous contrast. High resolution imaging of the lungs, as well as inspiratory and expiratory imaging, was performed. RADIATION DOSE REDUCTION: This exam was performed according to the departmental dose-optimization program which includes automated exposure control, adjustment of the mA and/or kV according to patient size and/or use of iterative reconstruction technique. COMPARISON:  Cardiac CT 10/21/2019. FINDINGS: Cardiovascular: Atherosclerotic calcification of the aorta and coronary arteries. Heart is enlarged. No pericardial effusion. Mediastinum/Nodes: Asymmetrically enlarged right lobe of the thyroid with a 1.5 cm heterogeneous nodule. No pathologically enlarged mediastinal or axillary lymph nodes. Hilar regions are difficult to definitively evaluate without IV contrast. Esophagus is grossly unremarkable. There is a small hiatal hernia eccentric wall thickening (3/124), new from  10/21/2019. Lungs/Pleura: Mosaic pulmonary parenchymal attenuation with air trapping. Negative for subpleural reticulation, traction bronchiectasis/bronchiolectasis, ground glass, architectural distortion or honeycombing. No suspicious pulmonary nodules. No pleural fluid. Airway is unremarkable. Upper Abdomen: Cholecystectomy. Punctate left renal stone. Hiatal hernia with eccentric wall thickening, as discussed above. Cystic change in the uncinate process of the pancreas, measuring up to approximately 1.5 cm (5/161). Mild pancreatic ductal dilatation, up to 4 mm in the body. Visualized portions of the liver, adrenal glands, kidneys, spleen, pancreas, stomach and bowel are otherwise grossly unremarkable. No upper abdominal adenopathy. Musculoskeletal: Degenerative changes in the spine. IMPRESSION: 1. No evidence of interstitial lung disease. Air trapping is indicative of small airways disease. 2. Small hiatal hernia with eccentric wall thickening, new from 10/21/2019. Difficult to exclude malignancy. Please correlate clinically and consider initial evaluation with a fluoroscopic upper GI. 3. Cystic change in the uncinate process of the pancreas with mild pancreatic ductal dilatation. Consider further evaluation with MR or CT abdomen without and with contrast, as clinically indicated. 4. These results will be called to the ordering clinician or representative by the Radiologist Assistant, and communication documented in the PACS or [employer]. 5. Punctate left renal stone. 6. Asymmetrically enlarged right lobe of the thyroid with a 1.5 cm heterogeneous nodule. Previous thyroid ultrasound 04/07/2021 with biopsy 05/04/2021. No follow-up recommended unless clinically warranted. (Ref: J Am Coll Radiol. 2015 Feb;12(2): 143-50). 7. Aortic atherosclerosis (ICD10-I70.0). Coronary artery calcification. Electronically Signed   By: Leanna Battles M.D.   On: 10/04/2023 16:54    LAB RESULTS last 90 days Recent Results  (from the past 2160 hours)  POCT EXHALED NITRIC OXIDE     Status: None   Collection Time: 08/15/23  4:06 PM  Result Value Ref Range   FeNO level (ppb) 40   CBC w/Diff     Status: Abnormal   Collection Time: 08/15/23  4:22 PM  Result Value Ref Range   WBC 8.5 4.0 - 10.5 K/uL   RBC 3.85 (L) 3.87 - 5.11 Mil/uL   Hemoglobin 12.0 12.0 - 15.0 g/dL   HCT 87.5 (L) 64.3 - 32.9 %   MCV 92.7 78.0 - 100.0 fl   MCHC 33.5 30.0 - 36.0 g/dL   RDW 51.8 84.1 - 66.0 %   Platelets 265.0 150.0 - 400.0 K/uL   Neutrophils Relative % 65.5 43.0 - 77.0 %   Lymphocytes  Relative 20.1 12.0 - 46.0 %   Monocytes Relative 8.8 3.0 - 12.0 %   Eosinophils Relative 4.8 0.0 - 5.0 %   Basophils Relative 0.8 0.0 - 3.0 %   Neutro Abs 5.6 1.4 - 7.7 K/uL   Lymphs Abs 1.7 0.7 - 4.0 K/uL   Monocytes Absolute 0.8 0.1 - 1.0 K/uL   Eosinophils Absolute 0.4 0.0 - 0.7 K/uL   Basophils Absolute 0.1 0.0 - 0.1 K/uL  B Nat Peptide     Status: Abnormal   Collection Time: 08/15/23  4:22 PM  Result Value Ref Range   Pro B Natriuretic peptide (BNP) 186.0 (H) 0.0 - 100.0 pg/mL  Perennial allergen profile IgE     Status: Abnormal   Collection Time: 08/15/23  4:22 PM  Result Value Ref Range   Class Description Allergens Comment     Comment:     Levels of Specific IgE       Class  Description of Class     ---------------------------  -----  --------------------                    < 0.10         0         Negative            0.10 -    0.31         0/I       Equivocal/Low            0.32 -    0.55         I         Low            0.56 -    1.40         II        Moderate            1.41 -    3.90         III       High            3.91 -   19.00         IV        Very High           19.01 -  100.00         V         Very High                   >100.00         VI        Very High    D Pteronyssinus IgE 0.99 (A) Class II kU/L   D Farinae IgE 0.77 (A) Class II kU/L   Cat Dander IgE 0.35 (A) Class I kU/L   Dog Dander IgE <0.10 Class 0  kU/L   Cow Dander IgE <0.10 Class 0 kU/L   Goose Feathers IgE <0.10 Class 0 kU/L   Chicken Feathers IgE <0.10 Class 0 kU/L   Duck Feathers IgE <0.10 Class 0 kU/L   Penicillium Chrysogen IgE <0.10 Class 0 kU/L   Cladosporium Herbarum IgE <0.10 Class 0 kU/L   Aspergillus Fumigatus IgE <0.10 Class 0 kU/L   Mucor Racemosus IgE <0.10 Class 0 kU/L   Candida Albicans IgE <0.10 Class 0 kU/L   Alternaria Alternata IgE <0.10 Class 0 kU/L   Setomelanomma Rostrat <0.10 Class 0 kU/L   Aureobasidi Pullulans IgE <0.10  Class 0 kU/L   Phoma Betae IgE <0.10 Class 0 kU/L   Stemphylium Herbarum IgE <0.10 Class 0 kU/L   Mouse Urine IgE <0.10 Class 0 kU/L         has a past medical history of Allergy, Anemia, Aortic insufficiency, Arthritis, Asthma, Cancer (HCC), Cancer (HCC), Cataract, DCM (dilated cardiomyopathy) (HCC), GERD (gastroesophageal reflux disease), History of syncope, HTN (hypertension), Hyperlipidemia, Mitral regurgitation, OSA on CPAP, Pulmonary HTN (HCC), TIA (transient ischemic attack), and Tricuspid regurgitation.   reports that she has never smoked. She has been exposed to tobacco smoke. She has never used smokeless tobacco.  Past Surgical History:  Procedure Laterality Date   ABDOMINAL HYSTERECTOMY     ANKLE FRACTURE SURGERY Left    APPENDECTOMY     CATARACT EXTRACTION, BILATERAL     CHOLECYSTECTOMY     COLONOSCOPY  08/01/2007   Dr Marina Goodell    DENTAL SURGERY     MELANOMA EXCISION     face   ovary removal     right leg  fracture surgery      SKIN BIOPSY     pre-cancerous on face    Allergies  Allergen Reactions   Lovastatin Other (See Comments)    myalgias   Sudafed [Pseudoephedrine Hcl] Other (See Comments)    Hallucinations     Immunization History  Administered Date(s) Administered   Fluad Quad(high Dose 65+) 05/31/2022   Influenza Inj Mdck Quad With Preservative 06/05/2017   Influenza Split 06/19/2014, 06/05/2017   Influenza, High Dose Seasonal PF 07/16/2018,  08/25/2023   Influenza-Unspecified 05/31/2015, 06/13/2016, 06/23/2020, 07/04/2021   Moderna Sars-Covid-2 Vaccination 08/01/2020   PFIZER(Purple Top)SARS-COV-2 Vaccination 11/06/2019, 11/27/2019   Pneumococcal Conjugate-13 04/14/2017   Pneumococcal Polysaccharide-23 06/28/2009   Respiratory Syncytial Virus Vaccine,Recomb Aduvanted(Arexvy) 08/25/2023   Tdap 01/06/2023    Family History  Problem Relation Age of Onset   Heart disease Mother    Kidney failure Mother    Diabetes Mother    Hyperlipidemia Mother    Hypertension Mother    Uterine cancer Mother    Dementia Father    Diabetes Sister    Multiple sclerosis Sister    Stomach cancer Sister    Dementia Sister    Throat cancer Brother        smoker   Diabetes Paternal Grandmother    Mitral valve prolapse Daughter    Colon cancer Neg Hx    Colon polyps Neg Hx    Esophageal cancer Neg Hx    Rectal cancer Neg Hx      Current Outpatient Medications:    acetaminophen (TYLENOL) 500 MG tablet, Take 500 mg by mouth every 6 (six) hours as needed for moderate pain or headache. , Disp: , Rfl:    alendronate (FOSAMAX) 70 MG tablet, TAKE 1 TABLET(70 MG) BY MOUTH 1 TIME A WEEK WITH A FULL GLASS OF WATER AND ON AN EMPTY STOMACH, Disp: 12 tablet, Rfl: 0   Ascorbic Acid (VITAMIN C PO), Take 1 tablet by mouth daily., Disp: , Rfl:    aspirin 81 MG chewable tablet, Chew 81 mg by mouth every other day., Disp: , Rfl:    atorvastatin (LIPITOR) 20 MG tablet, TAKE 1 TABLET(20 MG) BY MOUTH DAILY, Disp: 90 tablet, Rfl: 2   carvedilol (COREG) 12.5 MG tablet, TAKE 1 TABLET(12.5 MG) BY MOUTH TWICE DAILY. KEEP APPOINTMENT FOR FURTHER REFILLS, Disp: 180 tablet, Rfl: 3   Cholecalciferol (VITAMIN D3) 2000 units capsule, Take 2,000 Units by mouth daily., Disp: , Rfl:  Coenzyme Q10-Vitamin E (QUNOL ULTRA COQ10 PO), Take 1 capsule by mouth daily., Disp: , Rfl:    Cyanocobalamin 500 MCG CHEW, Take one daily, Disp: 90 tablet, Rfl: 1   fexofenadine (ALLEGRA)  180 MG tablet, Take 180 mg by mouth every morning., Disp: , Rfl:    fluticasone (FLONASE) 50 MCG/ACT nasal spray, Place 2 sprays into both nostrils daily., Disp: 16 g, Rfl: 6   hydrochlorothiazide (HYDRODIURIL) 25 MG tablet, Take 1 tablet (25 mg total) by mouth daily., Disp: 90 tablet, Rfl: 3   lisinopril (ZESTRIL) 20 MG tablet, Take 1 tablet (20 mg total) by mouth daily., Disp: 90 tablet, Rfl: 3   Misc Natural Products (NEURIVA PO), Take 1 tablet by mouth daily., Disp: , Rfl:    Multiple Vitamin (MULTIVITAMIN WITH MINERALS) TABS tablet, Take 1 tablet by mouth daily., Disp: , Rfl:    Polyethyl Glycol-Propyl Glycol (SYSTANE) 0.4-0.3 % SOLN, Place 1 drop into both eyes daily as needed (dry eyes)., Disp: , Rfl:       Objective:   Vitals:   10/05/23 1450  BP: (!) 152/76  Pulse: 75  SpO2: 96%  Weight: 190 lb (86.2 kg)  Height: 5\' 3"  (1.6 m)    Estimated body mass index is 33.66 kg/m as calculated from the following:   Height as of this encounter: 5\' 3"  (1.6 m).   Weight as of this encounter: 190 lb (86.2 kg).  @WEIGHTCHANGE @  [employer]   10/05/23 1450  Weight: 190 lb (86.2 kg)     Physical Exam   General: No distress. obese O2 at rest: no Cane present: YES Sitting in wheel chair: no Frail: no Obese: YES Neuro: Alert and Oriented x 3. GCS 15. Speech normal Psych: Pleasant Resp:  Barrel Chest - no.  Wheeze - no, Crackles - no, No overt respiratory distress CVS: Normal heart sounds. Murmurs - no Ext: Stigmata of Connective Tissue Disease - no HEENT: Normal upper airway. PEERL +. No post nasal drip        Assessment:       ICD-10-CM   1. Mild asthma without complication, unspecified whether persistent  J45.909 Ambulatory referral to Gastroenterology    2. Chronic cough  R05.3 Ambulatory referral to Gastroenterology    3. Pulmonary air trapping  R09.89     4. House dust mite allergy  Z91.09 Ambulatory referral to Gastroenterology    5. Hiatal hernia  K44.9  Ambulatory referral to Gastroenterology    6. DOE (dyspnea on exertion)  R06.09 Ambulatory referral to Gastroenterology    7. Esophageal thickening  K22.89 Ambulatory referral to Gastroenterology    8. Dysphagia, unspecified type  R13.10 Ambulatory referral to Gastroenterology    9. Pancreatic cyst  K86.2 Ambulatory referral to Gastroenterology    10. Right thyroid nodule  E04.1 Ambulatory referral to Gastroenterology         Plan:     Patient Instructions     ICD-10-CM   1. Mild asthma without complication, unspecified whether persistent  J45.909 Ambulatory referral to Gastroenterology    2. Chronic cough  R05.3 Ambulatory referral to Gastroenterology    3. Pulmonary air trapping  R09.89     4. House dust mite allergy  Z91.09 Ambulatory referral to Gastroenterology    5. Hiatal hernia  K44.9 Ambulatory referral to Gastroenterology    6. DOE (dyspnea on exertion)  R06.09 Ambulatory referral to Gastroenterology    7. Esophageal thickening  K22.89 Ambulatory referral to Gastroenterology    8. Dysphagia,  unspecified type  R13.10 Ambulatory referral to Gastroenterology    9. Pancreatic cyst  K86.2 Ambulatory referral to Gastroenterology    10. Right thyroid nodule  E04.1 Ambulatory referral to Gastroenterology     Mild asthma without complication, unspecified whether persistent -  Chronic cough - Pulmonary air trapping House dust mite allergy -  -Evidence suggest that you have some amount of asthma contributing to your cough and possibly a shortness of breath as well.  Although we do need to keep in mind carvedilol and Zestril might be contributing factors   PLAN - Try sample Trelegy 100 mcg 1 puff once daily for 1 month and see if this makes any difference.  -This might be the simplest of the options or other than stopping Coreg and Zestril which could have adverse implications for your heart. - next visit dscuss dust mite and cat control   DOE (dyspnea on  exertion) -obesity, physical deconditioning, diastolic dysfunction.  -This most likely related to weight, physical deconditioning and cardiac muscle stiffness and possibly mild asthma  Plan - Continue to monitor -Talk to primary care physician about weight loss drugs  Hiatal hernia  Esophageal thickening -  Dysphagia, unspecified type - P Pancreatic cyst -    -Radiology is concerned that there might be potential for esophageal cancer  - Plan:  -  Ambulatory referral to Gastroenterology  Right thyroid nodule -seen on recent CT chest  -Seems you are aware of this problem from the past  Plan: - talk to PCP Doreene Burke, Talmadge Coventry, MD    Follow-up - Return to see Dr. Marchelle Gearing in a 15-minute visit or nurse practitioner in 3 months -    FOLLOWUP Return in about 3 months (around 01/03/2024) for 15 min visit, with Dr Marchelle Gearing, Face to Face Visit.  ( Level 05 visit E&M 2024: Estb >= 40 min  in  visit type: on-site physical face to visit  in total care time and counseling or/and coordination of care by this undersigned MD - Dr [name] Shan. This includes one or more of the following on this same day 10/05/2023: pre-charting, chart review, note writing, documentation discussion of test results, diagnostic or treatment recommendations, prognosis, risks and benefits of management options, instructions, education, compliance or risk-factor reduction. It excludes time spent by the CMA or office staff in the care of the patient. Actual time 40 min)   SIGNATURE    Dr. [name] Shan, M.D., F.C.C.P,  Pulmonary and Critical Care Medicine Staff Physician, [facility] Director - Interstitial Lung Disease  Program  Pulmonary Fibrosis [facility] at [facility] [place], [name], [address]  Pager: [phone], If no answer or between  15:00h - 7:00h: call [phone] Telephone: [phone]  5:45 PM 10/05/2023

## 2023-10-05 NOTE — Patient Instructions (Addendum)
ICD-10-CM   1. Mild asthma without complication, unspecified whether persistent  J45.909 Ambulatory referral to Gastroenterology    2. Chronic cough  R05.3 Ambulatory referral to Gastroenterology    3. Pulmonary air trapping  R09.89     4. House dust mite allergy  Z91.09 Ambulatory referral to Gastroenterology    5. Hiatal hernia  K44.9 Ambulatory referral to Gastroenterology    6. DOE (dyspnea on exertion)  R06.09 Ambulatory referral to Gastroenterology    7. Esophageal thickening  K22.89 Ambulatory referral to Gastroenterology    8. Dysphagia, unspecified type  R13.10 Ambulatory referral to Gastroenterology    9. Pancreatic cyst  K86.2 Ambulatory referral to Gastroenterology    10. Right thyroid nodule  E04.1 Ambulatory referral to Gastroenterology     Mild asthma without complication, unspecified whether persistent -  Chronic cough - Pulmonary air trapping House dust mite allergy -  -Evidence suggest that you have some amount of asthma contributing to your cough and possibly a shortness of breath as well.  Although we do need to keep in mind carvedilol and Zestril might be contributing factors   PLAN - Try sample Trelegy 100 mcg 1 puff once daily for 1 month and see if this makes any difference.  -This might be the simplest of the options or other than stopping Coreg and Zestril which could have adverse implications for your heart. - next visit dscuss dust mite and cat control   DOE (dyspnea on exertion) -obesity, physical deconditioning, diastolic dysfunction.  -This most likely related to weight, physical deconditioning and cardiac muscle stiffness and possibly mild asthma  Plan - Continue to monitor -Talk to primary care physician about weight loss drugs  Hiatal hernia  Esophageal thickening -  Dysphagia, unspecified type - P Pancreatic cyst -    -Radiology is concerned that there might be potential for esophageal cancer  - Plan:  -  Ambulatory referral to  Gastroenterology  Right thyroid nodule -seen on recent CT chest  -Seems you are aware of this problem from the past  Plan: - talk to PCP Doreene Burke Talmadge Coventry, MD    Follow-up - Return to see Dr. Marchelle Gearing in a 15-minute visit or nurse practitioner in 3 months -

## 2023-10-24 ENCOUNTER — Encounter: Payer: Self-pay | Admitting: Physician Assistant

## 2023-10-31 DIAGNOSIS — L570 Actinic keratosis: Secondary | ICD-10-CM | POA: Diagnosis not present

## 2023-10-31 DIAGNOSIS — D485 Neoplasm of uncertain behavior of skin: Secondary | ICD-10-CM | POA: Diagnosis not present

## 2023-11-06 ENCOUNTER — Ambulatory Visit (INDEPENDENT_AMBULATORY_CARE_PROVIDER_SITE_OTHER): Payer: Medicare Other | Admitting: Family Medicine

## 2023-11-06 ENCOUNTER — Encounter: Payer: Self-pay | Admitting: Family Medicine

## 2023-11-06 VITALS — BP 132/68 | HR 73 | Temp 97.3°F | Ht 63.0 in | Wt 194.8 lb

## 2023-11-06 DIAGNOSIS — E538 Deficiency of other specified B group vitamins: Secondary | ICD-10-CM | POA: Diagnosis not present

## 2023-11-06 DIAGNOSIS — I10 Essential (primary) hypertension: Secondary | ICD-10-CM | POA: Diagnosis not present

## 2023-11-06 DIAGNOSIS — D649 Anemia, unspecified: Secondary | ICD-10-CM

## 2023-11-06 DIAGNOSIS — R7303 Prediabetes: Secondary | ICD-10-CM

## 2023-11-06 LAB — CBC WITH DIFFERENTIAL/PLATELET
Basophils Absolute: 0 10*3/uL (ref 0.0–0.1)
Basophils Relative: 0.6 % (ref 0.0–3.0)
Eosinophils Absolute: 0.2 10*3/uL (ref 0.0–0.7)
Eosinophils Relative: 2.5 % (ref 0.0–5.0)
HCT: 35 % — ABNORMAL LOW (ref 36.0–46.0)
Hemoglobin: 12 g/dL (ref 12.0–15.0)
Lymphocytes Relative: 22.1 % (ref 12.0–46.0)
Lymphs Abs: 1.7 10*3/uL (ref 0.7–4.0)
MCHC: 34.2 g/dL (ref 30.0–36.0)
MCV: 92.4 fL (ref 78.0–100.0)
Monocytes Absolute: 0.7 10*3/uL (ref 0.1–1.0)
Monocytes Relative: 8.7 % (ref 3.0–12.0)
Neutro Abs: 5 10*3/uL (ref 1.4–7.7)
Neutrophils Relative %: 66.1 % (ref 43.0–77.0)
Platelets: 223 10*3/uL (ref 150.0–400.0)
RBC: 3.79 Mil/uL — ABNORMAL LOW (ref 3.87–5.11)
RDW: 12.7 % (ref 11.5–15.5)
WBC: 7.6 10*3/uL (ref 4.0–10.5)

## 2023-11-06 LAB — BASIC METABOLIC PANEL
BUN: 22 mg/dL (ref 6–23)
CO2: 26 meq/L (ref 19–32)
Calcium: 9 mg/dL (ref 8.4–10.5)
Chloride: 100 meq/L (ref 96–112)
Creatinine, Ser: 0.72 mg/dL (ref 0.40–1.20)
GFR: 78.12 mL/min (ref >60.00)
Glucose, Bld: 95 mg/dL (ref 70–99)
Potassium: 4.5 meq/L (ref 3.5–5.1)
Sodium: 134 meq/L — ABNORMAL LOW (ref 135–145)

## 2023-11-06 LAB — VITAMIN B12: Vitamin B-12: 637 pg/mL (ref 211–911)

## 2023-11-06 LAB — HEMOGLOBIN A1C: Hgb A1c MFr Bld: 5.9 % (ref 4.6–6.5)

## 2023-11-06 NOTE — Progress Notes (Signed)
 Established Patient Office Visit   Subjective:  Patient ID: Kerry [name], female    DOB: 22-Mar-1941  Age: 83 y.o. MRN: [id number]  Chief Complaint  Patient presents with   Medical Management of Chronic Issues    6 week follow. HTN. Pt states she did cut donw on her sodium intake.     HPI Encounter Diagnoses  Name Primary?   Prediabetes Yes   Normocytic anemia    B12 deficiency    Essential hypertension    For follow-up of above.  Blood pressure under much better control.  Her GYN physician referred her to neurology for lower extremity neuropathy.  Thyroid function, B12 level and iron levels have all been normal.  There has been normocytic anemia.  Continues follow-up with cardiology for hypertension.  Exercise is difficult for her.  To her lower extremity paresthesias.    Review of Systems  Constitutional: Negative.   HENT: Negative.    Eyes:  Negative for blurred vision, discharge and redness.  Respiratory: Negative.    Cardiovascular: Negative.   Gastrointestinal:  Negative for abdominal pain.  Genitourinary: Negative.   Musculoskeletal: Negative.  Negative for myalgias.  Skin:  Negative for rash.  Neurological:  Positive for tingling. Negative for loss of consciousness and weakness.  Endo/Heme/Allergies:  Negative for polydipsia.     Current Outpatient Medications:    acetaminophen (TYLENOL) 500 MG tablet, Take 500 mg by mouth every 6 (six) hours as needed for moderate pain or headache. , Disp: , Rfl:    alendronate (FOSAMAX) 70 MG tablet, TAKE 1 TABLET(70 MG) BY MOUTH 1 TIME A WEEK WITH A FULL GLASS OF WATER AND ON AN EMPTY STOMACH, Disp: 12 tablet, Rfl: 0   Ascorbic Acid (VITAMIN C PO), Take 1 tablet by mouth daily., Disp: , Rfl:    aspirin 81 MG chewable tablet, Chew 81 mg by mouth every other day., Disp: , Rfl:    atorvastatin (LIPITOR) 20 MG tablet, TAKE 1 TABLET(20 MG) BY MOUTH DAILY, Disp: 90 tablet, Rfl: 2   carvedilol (COREG) 12.5 MG tablet, TAKE 1  TABLET(12.5 MG) BY MOUTH TWICE DAILY. KEEP APPOINTMENT FOR FURTHER REFILLS, Disp: 180 tablet, Rfl: 3   Cholecalciferol (VITAMIN D3) 2000 units capsule, Take 2,000 Units by mouth daily., Disp: , Rfl:    Coenzyme Q10-Vitamin E (QUNOL ULTRA COQ10 PO), Take 1 capsule by mouth daily., Disp: , Rfl:    Cyanocobalamin 500 MCG CHEW, Take one daily, Disp: 90 tablet, Rfl: 1   fexofenadine (ALLEGRA) 180 MG tablet, Take 180 mg by mouth every morning., Disp: , Rfl:    fluticasone (FLONASE) 50 MCG/ACT nasal spray, Place 2 sprays into both nostrils daily., Disp: 16 g, Rfl: 6   Fluticasone-Umeclidin-Vilant (TRELEGY ELLIPTA) 100-62.5-25 MCG/ACT AEPB, Inhale 1 puff into the lungs as needed (as needed)., Disp: , Rfl:    hydrochlorothiazide (HYDRODIURIL) 25 MG tablet, Take 1 tablet (25 mg total) by mouth daily., Disp: 90 tablet, Rfl: 3   lisinopril (ZESTRIL) 20 MG tablet, Take 1 tablet (20 mg total) by mouth daily., Disp: 90 tablet, Rfl: 3   Misc Natural Products (NEURIVA PO), Take 1 tablet by mouth daily., Disp: , Rfl:    Multiple Vitamin (MULTIVITAMIN WITH MINERALS) TABS tablet, Take 1 tablet by mouth daily., Disp: , Rfl:    Polyethyl Glycol-Propyl Glycol (SYSTANE) 0.4-0.3 % SOLN, Place 1 drop into both eyes daily as needed (dry eyes)., Disp: , Rfl:    Objective:     BP 132/68   Pulse  73   Temp (!) 97.3 F (36.3 C)   Ht 5\' 3"  (1.6 m)   Wt 194 lb 12.8 oz (88.4 kg)   SpO2 97%   BMI 34.51 kg/m    Physical Exam Constitutional:      General: She is not in acute distress.    Appearance: Normal appearance. She is not ill-appearing, toxic-appearing or diaphoretic.  HENT:     Head: Normocephalic and atraumatic.     Right Ear: External ear normal.     Left Ear: External ear normal.  Eyes:     General: No scleral icterus.       Right eye: No discharge.        Left eye: No discharge.     Extraocular Movements: Extraocular movements intact.     Conjunctiva/sclera: Conjunctivae normal.  Pulmonary:      Effort: Pulmonary effort is normal. No respiratory distress.  Skin:    General: Skin is warm and dry.  Neurological:     Mental Status: She is alert and oriented to person, place, and time.  Psychiatric:        Mood and Affect: Mood normal.        Behavior: Behavior normal.      No results found for any visits on 11/06/23.    The ASCVD Risk score (Arnett DK, et al., 2019) failed to calculate for the following reasons:   The 2019 ASCVD risk score is only valid for ages 20 to 9   Risk score cannot be calculated because patient has a medical history suggesting prior/existing ASCVD    Assessment & Plan:   Prediabetes -     Basic metabolic panel -     Hemoglobin A1c  Normocytic anemia -     CBC with Differential/Platelet -     Vitamin B12 -     Iron, TIBC and Ferritin Panel  B12 deficiency -     Vitamin B12  Essential hypertension    Return in about 6 months (around 05/05/2024).  Encouraged weight loss.  Encouraged moderation to hydrate intake.  Discussed using metformin with increasing A1c.  Rechecking B12 and iron levels.  Mliss Sax, MD

## 2023-11-07 LAB — IRON,TIBC AND FERRITIN PANEL
%SAT: 28 % (ref 16–45)
Ferritin: 36 ng/mL (ref 16–288)
Iron: 105 ug/dL (ref 45–160)
TIBC: 372 ug/dL (ref 250–450)

## 2023-11-07 MED ORDER — IRON (FERROUS SULFATE) 325 (65 FE) MG PO TABS
325.0000 mg | ORAL_TABLET | ORAL | 3 refills | Status: DC
Start: 2023-11-07 — End: 2024-04-25

## 2023-11-07 NOTE — Addendum Note (Signed)
 Addended by: Andrez Grime on: 11/07/2023 07:53 AM   Modules accepted: Orders

## 2023-12-05 NOTE — Progress Notes (Unsigned)
 12/06/2023 Kerry [name] [id number] Mar 20, 1941  Referring provider: [name] Shan, MD Primary GI doctor: Dr. Marina [name]  ASSESSMENT AND PLAN:   Chronic cough, esophageal thickening seen on CT chest high-resolution 10/02/2023 for SOB History of GERD remote has not had GERD for years, some hoarseness and SOB, no AB pain One episode of dysphagia with rice in Dec, otherwise no dysphagia No NSAIDS, no ETOH, no smoking history Possible LPR, rule out malignancy Has been on fosamax x 1 year, once a week -consider alternative for fosamax - start on pepcid 40 mg at night, consider PPI after EGD - EGD to evaluate esophagitis, rule out malignancy.  - likely okay for LEC but will send note to Kerry [name] for chart evaluation I discussed risks of EGD with patient today, including risk of sedation, bleeding or perforation.  Patient provides understanding and gave verbal consent to proceed.  Pancreatic cyst with mild pancreatic ductal dilation, 1.5 cm Seen on CT chest high-resolution 10/02/2023 S/p cholecystectomy No ETOH, no family history of pancreatitis, no personal history of pancreatitis Needs further imaging - will order MRCP to evaluate further, possible IPMN - check CA19-9, lipase  Fecal incontinence Smear loose stools, hard stools and decreased rectal tone on exam Likely pelvic floor Will do bowel purge and then miralax/fiber add on fiber/miralax  Personal history of colon polyp 05/07/2019 colonoscopy Dr. Marina [name] 1 mm polyp cecum, diverticula left and right colon otherwise unremarkable, benign polyp no further screening colonoscopies recommended due to age.  Shortness of breath with mild pulmonary hypertension 03/2023 echo EF 50 to 55% grade 1 diastolic dysfunction mildly elevated pulmonary artery systolic pressure RVSP 40.7 moderate TR no aortic stenosis but mild to moderate aortic regurgitation-pending repeat study VQ scan negative CT chest high-resolution no interstitial lung  disease - suggest sleep study, was on CPAP prior - getting repeat echo in July -Not on O2 - should be appropriate for LEC  Anemia normocytic 11/06/2023  HGB 12.0 MCV 92.4 Platelets 223.0 11/06/2023 Iron 105 Ferritin 36 started on iron after the Feb blood draw ,no iron def at that time. Can do trial off B12 637-  Recent Labs    03/08/23 2222 03/15/23 0843 08/15/23 1622 11/06/23 1058  HGB 10.9* 11.8* 12.0 12.0      Patient Care Team: Kerry Sax, MD as PCP - General (Family Medicine) Kerry Chimera, MD as Consulting Physician (Obstetrics and Gynecology) Kerry Reichert, MD as Consulting Physician (Cardiology) [facility]  HISTORY OF PRESENT ILLNESS: 83 y.o. female with a past medical history listed below, known to Dr. Marina [name] presents with chronic cough, hiatal hernia esophageal thickening dysphagia and pancreatic cysts.  Discussed the use of AI scribe software for clinical note transcription with the patient, who gave verbal consent to proceed.  History of Present Illness   [name] is an 82 year old female who presents with esophageal inflammation and a pancreatic cyst.  She was unaware of her current medical issues until a CT scan revealed esophageal thickening and a pancreatic cyst measuring 1.5 cm. She has a history of a benign polyp and diverticula found during a colonoscopy in 2020, with no further recommendations unless symptoms like bleeding occur. She used to have frequent heartburn and relied on Tums, but has not experienced heartburn for years. She recalls a single incident in December where she had difficulty swallowing rice, but it resolved without further issues. No current reflux, heartburn, or epigastric discomfort. She started an iron supplement three to  four weeks ago due to a blood test indicating iron deficiency. She has a history of anemia with a hemoglobin level of 10.1 in 2023, but her B12 levels are normal.  She experiences a  chronic cough that is not present every day but persists over time. She notes a scratchy throat that started recently, which she attributes to allergies. She takes Allegra daily for allergies and asthma, and denies using Aleve, ibuprofen, or steroids.  She experiences fecal incontinence, describing it as 'leaking' after using the bathroom, with stools being soft and mushy. She uses Imodium occasionally, which results in not having bowel movements for two to three days. No dark or black stools except since starting iron supplements. She uses a cane occasionally due to balance issues and reports swelling in her legs and feet, particularly in her big toe, describing it as feeling like 'carrying a cement bag.'  She reports shortness of breath, particularly when walking fast, and uses Halls lozenges to help. No chest discomfort and is not on oxygen. She previously used a CPAP for about a year but stopped due to an injury. She sleeps about six hours a night, waking to use the bathroom, and sometimes returns to sleep for a couple more hours.  She denies alcohol use, describing herself as a 'cheap drunk.' She has a history of gallbladder removal in the 1990s and denies any family history of gastric or pancreatic issues. Father had gallbladder issues. She reports numbness and tingling in her legs and feet, sometimes accompanied by pain, which starts at her feet and moves upward. No restlessness in her legs.        She  reports that she has never smoked. She has been exposed to tobacco smoke. She has never used smokeless tobacco. She reports that she does not drink alcohol and does not use drugs.  RELEVANT GI HISTORY, IMAGING AND LABS: Results   LABS Iron: 105 (10/2023) Ferritin: 36 (10/2023) TIBC: 372 (10/2023) Saturation: 28 (10/2023) B12: Normal (10/2023) Hb: 12 (10/2023) Hb: 10.1 (2023) MCV: 92  RADIOLOGY Chest CT: Esophageal thickening, esophageal inflammation, 1.5 cm pancreatic cyst CT: No  interstitial lung disease, small airway asthma  DIAGNOSTIC Colonoscopy: 1 mm benign polyp, diverticula (2020) Echocardiogram: Normal VQ scan: Negative for pulmonary embolism      CBC    Component Value Date/Time   WBC 7.6 11/06/2023 1058   RBC 3.79 (L) 11/06/2023 1058   HGB 12.0 11/06/2023 1058   HGB 11.6 07/10/2020 1209   HCT 35.0 (L) 11/06/2023 1058   HCT 36.4 07/10/2020 1209   PLT 223.0 11/06/2023 1058   PLT 311 06/27/2019 1006   MCV 92.4 11/06/2023 1058   MCV 95 07/10/2020 1209   MCH 31.0 03/08/2023 2222   MCHC 34.2 11/06/2023 1058   RDW 12.7 11/06/2023 1058   RDW 11.5 (L) 07/10/2020 1209   LYMPHSABS 1.7 11/06/2023 1058   LYMPHSABS 1.9 07/10/2020 1209   MONOABS 0.7 11/06/2023 1058   EOSABS 0.2 11/06/2023 1058   EOSABS 0.4 07/10/2020 1209   BASOSABS 0.0 11/06/2023 1058   BASOSABS 0.1 07/10/2020 1209   Recent Labs    03/08/23 2222 03/15/23 0843 08/15/23 1622 11/06/23 1058  HGB 10.9* 11.8* 12.0 12.0    CMP     Component Value Date/Time   NA 134 (L) 11/06/2023 1058   NA 139 07/10/2020 1209   K 4.5 11/06/2023 1058   CL 100 11/06/2023 1058   CO2 26 11/06/2023 1058   GLUCOSE 95 11/06/2023 1058  BUN 22 11/06/2023 1058   BUN 25 07/10/2020 1209   CREATININE 0.72 11/06/2023 1058   CREATININE 0.84 04/28/2023 1530   CALCIUM 9.0 11/06/2023 1058   PROT 7.2 03/21/2023 0954   PROT 7.1 07/10/2020 1209   ALBUMIN 4.0 03/15/2023 0843   ALBUMIN 4.1 07/10/2020 1209   AST 21 03/15/2023 0843   ALT 16 03/15/2023 0843   ALKPHOS 98 03/15/2023 0843   BILITOT 0.8 03/15/2023 0843   BILITOT 0.4 07/10/2020 1209   GFRNONAA 57 (L) 03/08/2023 2222   GFRAA 82 07/10/2020 1209      Latest Ref Rng & Units 03/21/2023    9:54 AM 03/15/2023    8:43 AM 03/08/2023   10:22 PM  Hepatic Function  Total Protein 6.1 - 8.1 g/dL 7.2  7.2  6.5   Albumin 3.5 - 5.2 g/dL  4.0  3.3   AST 0 - 37 U/L  21  23   ALT 0 - 35 U/L  16  16   Alk Phosphatase 39 - 117 U/L  98  81   Total Bilirubin 0.2 -  1.2 mg/dL  0.8  0.8       Current Medications:   Current Outpatient Medications (Endocrine & Metabolic):    alendronate (FOSAMAX) 70 MG tablet, TAKE 1 TABLET(70 MG) BY MOUTH 1 TIME A WEEK WITH A FULL GLASS OF WATER AND ON AN EMPTY STOMACH  Current Outpatient Medications (Cardiovascular):    atorvastatin (LIPITOR) 20 MG tablet, TAKE 1 TABLET(20 MG) BY MOUTH DAILY   carvedilol (COREG) 12.5 MG tablet, TAKE 1 TABLET(12.5 MG) BY MOUTH TWICE DAILY. KEEP APPOINTMENT FOR FURTHER REFILLS   hydrochlorothiazide (HYDRODIURIL) 25 MG tablet, Take 1 tablet (25 mg total) by mouth daily.   lisinopril (ZESTRIL) 20 MG tablet, Take 1 tablet (20 mg total) by mouth daily.  Current Outpatient Medications (Respiratory):    fexofenadine (ALLEGRA) 180 MG tablet, Take 180 mg by mouth every morning.   fluticasone (FLONASE) 50 MCG/ACT nasal spray, Place 2 sprays into both nostrils daily.   Fluticasone-Umeclidin-Vilant (TRELEGY ELLIPTA) 100-62.5-25 MCG/ACT AEPB, Inhale 1 puff into the lungs as needed (as needed). (Patient not taking: Reported on 12/06/2023)  Current Outpatient Medications (Analgesics):    acetaminophen (TYLENOL) 500 MG tablet, Take 500 mg by mouth every 6 (six) hours as needed for moderate pain or headache.    aspirin 81 MG chewable tablet, Chew 81 mg by mouth every other day.  Current Outpatient Medications (Hematological):    Cyanocobalamin 500 MCG CHEW, Take one daily   Iron, Ferrous Sulfate, 325 (65 Fe) MG TABS, Take 325 mg by mouth every other day.  Current Outpatient Medications (Other):    Ascorbic Acid (VITAMIN C PO), Take 1 tablet by mouth daily.   Cholecalciferol (VITAMIN D3) 2000 units capsule, Take 2,000 Units by mouth daily.   Coenzyme Q10-Vitamin E (QUNOL ULTRA COQ10 PO), Take 1 capsule by mouth daily.   Misc Natural Products (NEURIVA PO), Take 1 tablet by mouth daily.   Multiple Vitamin (MULTIVITAMIN WITH MINERALS) TABS tablet, Take 1 tablet by mouth daily.   Polyethyl  Glycol-Propyl Glycol (SYSTANE) 0.4-0.3 % SOLN, Place 1 drop into both eyes daily as needed (dry eyes).  Medical History:  Past Medical History:  Diagnosis Date   Allergy    Anemia    in her 20's   Aortic insufficiency    i   Arthritis    not dx'd- just per pt    Asthma    AS A CHILD  Cancer [facility])    endometrial cancer with hysterectomy in 1987   Cancer (HCC)    melanoma on face    Cataract    removed both eyes    DCM (dilated cardiomyopathy) (HCC)    EF 50-55%   GERD (gastroesophageal reflux disease)    occ has with diet    History of syncope    HTN (hypertension)    Hyperlipidemia    Mitral regurgitation    mild by echo 03/2023   OSA on CPAP    Pulmonary HTN (HCC)    PASP on echo 03/2023   TIA (transient ischemic attack)    Tricuspid regurgitation    moderate by echo 03/2023   Allergies:  Allergies  Allergen Reactions   Lovastatin Other (See Comments)    myalgias   Sudafed [Pseudoephedrine Hcl] Other (See Comments)    Hallucinations      Surgical History:  She  has a past surgical history that includes Appendectomy; Cholecystectomy; Abdominal hysterectomy; Cataract extraction, bilateral; Colonoscopy (08/01/2007); Ankle fracture surgery (Left); right leg  fracture surgery ; Dental surgery; ovary removal; Skin biopsy; and Melanoma excision. Family History:  Her family history includes Dementia in her father and sister; Diabetes in her mother, paternal grandmother, and sister; Heart disease in her mother; Hyperlipidemia in her mother; Hypertension in her mother; Kidney failure in her mother; Mitral valve prolapse in her daughter; Multiple sclerosis in her sister; Stomach cancer in her sister; Throat cancer in her brother; Uterine cancer in her mother.  REVIEW OF SYSTEMS  : All other systems reviewed and negative except where noted in the History of Present Illness.  PHYSICAL EXAM: BP 130/62   Pulse 73   Ht 5\' 3"  (1.6 m)   Wt 196 lb (88.9 kg)   BMI 34.72  kg/m  Physical Exam   GENERAL APPEARANCE: Well nourished, in no apparent distress. HEENT: No cervical lymphadenopathy, unremarkable thyroid, sclerae anicteric, conjunctiva pink. RESPIRATORY: Respiratory effort normal, breath sounds clear to auscultation bilaterally without rales, rhonchi, or wheezing. CARDIO: Regular rate and rhythm with no murmurs, rubs, or gallops, peripheral pulses intact. ABDOMEN: Soft, non-distended, active bowel sounds in all four quadrants, non-tender to palpation, no rebound, no mass appreciated. RECTAL: Decreased rectal tone, constipation with hard stool, left internal hemorrhoid observed. MUSCULOSKELETAL: Full range of motion, normal gait, without edema. SKIN: Dry, intact without rashes or lesions. No jaundice. NEURO: Alert, oriented, no focal deficits. PSYCH: Cooperative, normal mood and affect. NECK: No cervical lymphadenopathy, thyroid normal. EXTREMITIES: Bilateral leg swelling, non-pitting.      Doree Albee, PA-C 10:33 AM

## 2023-12-06 ENCOUNTER — Ambulatory Visit: Payer: Medicare Other | Admitting: Physician Assistant

## 2023-12-06 ENCOUNTER — Encounter: Payer: Self-pay | Admitting: Physician Assistant

## 2023-12-06 ENCOUNTER — Other Ambulatory Visit (INDEPENDENT_AMBULATORY_CARE_PROVIDER_SITE_OTHER)

## 2023-12-06 VITALS — BP 130/62 | HR 73 | Ht 63.0 in | Wt 196.0 lb

## 2023-12-06 DIAGNOSIS — R053 Chronic cough: Secondary | ICD-10-CM

## 2023-12-06 DIAGNOSIS — R933 Abnormal findings on diagnostic imaging of other parts of digestive tract: Secondary | ICD-10-CM | POA: Diagnosis not present

## 2023-12-06 DIAGNOSIS — R159 Full incontinence of feces: Secondary | ICD-10-CM

## 2023-12-06 DIAGNOSIS — D649 Anemia, unspecified: Secondary | ICD-10-CM

## 2023-12-06 DIAGNOSIS — K209 Esophagitis, unspecified without bleeding: Secondary | ICD-10-CM

## 2023-12-06 DIAGNOSIS — K8689 Other specified diseases of pancreas: Secondary | ICD-10-CM

## 2023-12-06 DIAGNOSIS — D378 Neoplasm of uncertain behavior of other specified digestive organs: Secondary | ICD-10-CM | POA: Diagnosis not present

## 2023-12-06 DIAGNOSIS — R0602 Shortness of breath: Secondary | ICD-10-CM

## 2023-12-06 DIAGNOSIS — K862 Cyst of pancreas: Secondary | ICD-10-CM

## 2023-12-06 DIAGNOSIS — I272 Pulmonary hypertension, unspecified: Secondary | ICD-10-CM

## 2023-12-06 DIAGNOSIS — I420 Dilated cardiomyopathy: Secondary | ICD-10-CM

## 2023-12-06 DIAGNOSIS — Z860100 Personal history of colon polyps, unspecified: Secondary | ICD-10-CM

## 2023-12-06 LAB — HEPATIC FUNCTION PANEL
ALT: 14 U/L (ref 0–35)
AST: 17 U/L (ref 0–37)
Albumin: 4 g/dL (ref 3.5–5.2)
Alkaline Phosphatase: 72 U/L (ref 39–117)
Bilirubin, Direct: 0.1 mg/dL (ref 0.0–0.3)
Total Bilirubin: 0.5 mg/dL (ref 0.2–1.2)
Total Protein: 7.2 g/dL (ref 6.0–8.3)

## 2023-12-06 LAB — CBC WITH DIFFERENTIAL/PLATELET
Basophils Absolute: 0.1 10*3/uL (ref 0.0–0.1)
Basophils Relative: 0.9 % (ref 0.0–3.0)
Eosinophils Absolute: 0.4 10*3/uL (ref 0.0–0.7)
Eosinophils Relative: 5.2 % — ABNORMAL HIGH (ref 0.0–5.0)
HCT: 35 % — ABNORMAL LOW (ref 36.0–46.0)
Hemoglobin: 11.8 g/dL — ABNORMAL LOW (ref 12.0–15.0)
Lymphocytes Relative: 21.2 % (ref 12.0–46.0)
Lymphs Abs: 1.7 10*3/uL (ref 0.7–4.0)
MCHC: 33.8 g/dL (ref 30.0–36.0)
MCV: 94.1 fl (ref 78.0–100.0)
Monocytes Absolute: 0.7 10*3/uL (ref 0.1–1.0)
Monocytes Relative: 9.4 % (ref 3.0–12.0)
Neutro Abs: 5 10*3/uL (ref 1.4–7.7)
Neutrophils Relative %: 63.3 % (ref 43.0–77.0)
Platelets: 222 10*3/uL (ref 150.0–400.0)
RBC: 3.72 Mil/uL — ABNORMAL LOW (ref 3.87–5.11)
RDW: 13 % (ref 11.5–15.5)
WBC: 7.9 10*3/uL (ref 4.0–10.5)

## 2023-12-06 LAB — BASIC METABOLIC PANEL
BUN: 18 mg/dL (ref 6–23)
CO2: 27 meq/L (ref 19–32)
Calcium: 9.2 mg/dL (ref 8.4–10.5)
Chloride: 103 meq/L (ref 96–112)
Creatinine, Ser: 0.73 mg/dL (ref 0.40–1.20)
GFR: 76.8 mL/min (ref >60.00)
Glucose, Bld: 109 mg/dL — ABNORMAL HIGH (ref 70–99)
Potassium: 4.3 meq/L (ref 3.5–5.1)
Sodium: 137 meq/L (ref 135–145)

## 2023-12-06 LAB — LIPASE: Lipase: 36 U/L (ref 11.0–59.0)

## 2023-12-06 NOTE — Patient Instructions (Addendum)
 You will be contacted by [facility] Sched[facility] in the next 2 days to arrange a MRCP.  The number on your caller ID will be [phone], please answer when they call.  If you have not heard from them in 2 days please call [phone] to schedule.     Your provider has requested that you go to the basement level for lab work before leaving today. Press "B" on the elevator. The lab is located at the first door on the left as you exit the elevator.  You have been scheduled for an endoscopy. Please follow written instructions given to you at your visit today.  If you use inhalers (even only as needed), please bring them with you on the day of your procedure.  If you take any of the following medications, they will need to be adjusted prior to your procedure:   DO NOT TAKE 7 DAYS PRIOR TO TEST- Trulicity (dulaglutide) Ozempic, Wegovy (semaglutide) Mounjaro (tirzepatide) Bydureon Bcise (exanatide extended release)  DO NOT TAKE 1 DAY PRIOR TO YOUR TEST Rybelsus (semaglutide) Adlyxin (lixisenatide) Victoza (liraglutide) Byetta (exanatide) ___________________________________________________________________________  Due to recent changes in healthcare laws, you may see the results of your imaging and laboratory studies on MyChart before your provider has had a chance to review them.  We understand that in some cases there may be results that are confusing or concerning to you. Not all laboratory results come back in the same time frame and the provider may be waiting for multiple results in order to interpret others.  Please give [place] 48 hours in order for your provider to thoroughly review all the results before contacting the office for clarification of your results.  _______________________________________________________  If your blood pressure at your visit was 140/90 or greater, please contact your primary care physician to follow up on  this.  _______________________________________________________  If you are age 74 or older, your body mass index should be between 23-30. Your Body mass index is 34.72 kg/m. If this is out of the aforementioned range listed, please consider follow up with your Primary Care Provider.  If you are age 12 or younger, your body mass index should be between 19-25. Your Body mass index is 34.72 kg/m. If this is out of the aformentioned range listed, please consider follow up with your Primary Care Provider.   ________________________________________________________  The [place] [facility] providers would like to encourage you to use [facility] to communicate with providers for non-urgent requests or questions.  Due to long hold times on the telephone, sending your provider a message by [facility] may be a faster and more efficient way to get a response.  Please allow 48 business hours for a response.  Please remember that this is for non-urgent requests.  _______________________________________________________   Please do the following: Purchase a bottle of Miralax over the counter as well as a box of 5 mg dulcolax tablets. Take 4 dulcolax tablets. Wait 1 hour. You will then drink 6-8 capfuls of Miralax mixed in an adequate amount of water/juice/gatorade (you may choose which of these liquids to drink) over the next 2-3 hours. You should expect results within 1 to 6 hours after completing the bowel purge. Go to the er if you have severe AB pain, can not pass gas or stool in over 12 hours, can not hold down any food.   Miralax is an osmotic laxative.  It only brings more water into the stool.  This is safe to take daily.  Can take up  to 17 gram of miralax twice a day.  Mix with juice or coffee.  Start 1 capful at night for 3-4 days and reassess your response in 3-4 days.  You can increase and decrease the dose based on your response.  Remember, it can take up to 3-4 days to take effect OR for the effects to  wear off.   I often pair this with benefiber in the morning to help assure the stool is not too loose.   Toileting tips to help with your constipation - Drink at least 64-80 ounces of water/liquid per day. - Establish a time to try to move your bowels every day.  For many people, this is after a cup of coffee or after a meal such as breakfast. - Sit all of the way back on the toilet keeping your back fairly straight and while sitting up, try to rest the tops of your forearms on your upper thighs.   - Raising your feet with a step stool/squatty potty can be helpful to improve the angle that allows your stool to pass through the rectum. - Relax the rectum feeling it bulge toward the toilet water.  If you feel your rectum raising toward your body, you are contracting rather than relaxing. - Breathe in and slowly exhale. "Belly breath" by expanding your belly towards your belly button. Keep belly expanded as you gently direct pressure down and back to the anus.  A low pitched GRRR sound can assist with increasing intra-abdominal pressure.  (Can also trying to blow on a pinwheel and make it move, this helps with the same belly breathing) - Repeat 3-4 times. If unsuccessful, contract the pelvic floor to restore normal tone and get off the toilet.  Avoid excessive straining. - To reduce excessive wiping by teaching your anus to normally contract, place hands on outer aspect of knees and resist knee movement outward.  Hold 5-10 second then place hands just inside of knees and resist inward movement of knees.  Hold 5 seconds.  Repeat a few times each way.  Go to the ER if unable to pass gas, severe AB pain, unable to hold down food, any shortness of breath of chest pain.  Silent reflux: Not all heartburn burns...Marland KitchenMarland KitchenMarland Kitchen  What is LPR? Laryngopharyngeal reflux (LPR) or silent reflux is a condition in which acid that is made in the stomach travels up the esophagus (swallowing tube) and gets to the throat. Not  everyone with reflux has a lot of heartburn or indigestion. In fact, many people with LPR never have heartburn. This is why LPR is called SILENT REFLUX, and the terms "Silent reflux" and "LPR" are often used interchangeably. Because LPR is silent, it is sometimes difficult to diagnose.  How can you tell if you have LPR?  Chronic hoarseness- Some people have hoarseness that comes and goes throat clearing  Cough It can cause shortness of breath and cause asthma like symptoms. a feeling of a lump in the throat  difficulty swallowing a problem with too much nose and throat drainage.  Some people will feel their esophagus spasm which feels like their heart beating hard and fast, this will usually be after a meal, at rest, or lying down at night.    How do I treat this? Treatment for LPR should be individualized, and your doctor will suggest the best treatment for you. Generally there are several treatments for LPR: changing habits and diet to reduce reflux,  medications to reduce stomach acid, and  surgery to prevent reflux. Most people with LPR need to modify how and when they eat, as well as take some medication, to get well. Sometimes, nonprescription liquid antacids, such as Maalox, Gelucil and Mylanta are recommended. When used, these antacids should be taken four times each day - one tablespoon one hour after each meal and before bedtime. Dietary and lifestyle changes alone are not often enough to control LPR - medications that reduce stomach acid are also usually needed. These must be prescribed by our doctor.   TIPS FOR REDUCING REFLUX AND LPR Control your LIFE-STYLE and your DIET! If you use tobacco, QUIT.  Smoking makes you reflux. After every cigarette you have some LPR.  Don't wear clothing that is too tight, especially around the waist (trousers, corsets, belts).  Do not lie down just after eating...in fact, do not eat within three hours of bedtime.  You should be on a low-fat  diet.  Limit your intake of red meat.  Limit your intake of butter.  Avoid fried foods.  Avoid chocolate  Avoid cheese.  Avoid eggs. Specifically avoid caffeine (especially coffee and tea), soda pop (especially cola) and mints.  Avoid alcoholic beverages, particularly in the evening.

## 2023-12-06 NOTE — Progress Notes (Signed)
 Noted.

## 2023-12-07 ENCOUNTER — Other Ambulatory Visit: Payer: Self-pay

## 2023-12-07 ENCOUNTER — Ambulatory Visit (INDEPENDENT_AMBULATORY_CARE_PROVIDER_SITE_OTHER)

## 2023-12-07 DIAGNOSIS — D649 Anemia, unspecified: Secondary | ICD-10-CM

## 2023-12-07 LAB — CANCER ANTIGEN 19-9: CA 19-9: 6 U/mL (ref <34)

## 2023-12-08 LAB — IBC + FERRITIN
Ferritin: 30.8 ng/mL (ref 10.0–291.0)
Iron: 69 ug/dL (ref 42–145)
Saturation Ratios: 19.1 % — ABNORMAL LOW (ref 20.0–50.0)
TIBC: 361.2 ug/dL (ref 250.0–450.0)
Transferrin: 258 mg/dL (ref 212.0–360.0)

## 2023-12-17 ENCOUNTER — Other Ambulatory Visit: Payer: Self-pay | Admitting: Physician Assistant

## 2023-12-17 ENCOUNTER — Ambulatory Visit (HOSPITAL_COMMUNITY)
Admission: RE | Admit: 2023-12-17 | Discharge: 2023-12-17 | Disposition: A | Discharge status: Home / Self Care | Source: Ambulatory Visit | Attending: Physician Assistant | Admitting: Physician Assistant

## 2023-12-17 ENCOUNTER — Other Ambulatory Visit: Payer: Self-pay | Admitting: Rheumatology

## 2023-12-17 DIAGNOSIS — K862 Cyst of pancreas: Secondary | ICD-10-CM | POA: Insufficient documentation

## 2023-12-17 DIAGNOSIS — Z9049 Acquired absence of other specified parts of digestive tract: Secondary | ICD-10-CM | POA: Diagnosis not present

## 2023-12-17 DIAGNOSIS — K449 Diaphragmatic hernia without obstruction or gangrene: Secondary | ICD-10-CM | POA: Diagnosis not present

## 2023-12-17 DIAGNOSIS — K209 Esophagitis, unspecified without bleeding: Secondary | ICD-10-CM | POA: Insufficient documentation

## 2023-12-17 DIAGNOSIS — M8589 Other specified disorders of bone density and structure, multiple sites: Secondary | ICD-10-CM

## 2023-12-17 DIAGNOSIS — M47816 Spondylosis without myelopathy or radiculopathy, lumbar region: Secondary | ICD-10-CM | POA: Diagnosis not present

## 2023-12-17 MED ORDER — GADOBUTROL 1 MMOL/ML IV SOLN
8.0000 mL | Freq: Once | INTRAVENOUS | Status: AC | PRN
  Administered 2023-12-17: 8 mL via INTRAVENOUS

## 2023-12-18 ENCOUNTER — Telehealth: Payer: Self-pay | Admitting: Physician Assistant

## 2023-12-18 NOTE — Telephone Encounter (Signed)
 Patient called and stated that she was returning a call she had received regarding her having a MRI. Patient is requesting a call back. Please advise.

## 2023-12-18 NOTE — Telephone Encounter (Signed)
 Returned call to patient. See 4/6 MRCP result note for details.

## 2023-12-18 NOTE — Telephone Encounter (Signed)
 Last Fill: 09/25/2023  Labs: 12/06/2023 Glucose 109, RBC 3.72, Hgb 11.8, Hct 35.0, Eosinophils Relative 5.2  Next Visit: 01/02/2024  Last Visit: 07/12/2023  DX: Osteopenia of multiple sites   Current Dose per office note 07/12/2023: Fosamax 70 mg p.o. weekly   Okay to refill Fosamax?

## 2023-12-19 NOTE — Progress Notes (Signed)
 Office Visit Note  Patient: Kerry  KAMERON [name]             Date of Birth: [birth year]           MRN: [id number]             PCP: Tonna Frederic, MD Referring: Tonna Frederic,* Visit Date: 01/02/2024 Occupation: @GUAROCC @  Subjective:  Pain in both feet   History of Present Illness: Kerry  F [name] is a 83 y.o. female with osteoarthritis, degenerative disc disease and osteopenia.  She returns today after her last visit on July 12, 2023.  She states she continues to have pain and discomfort in her both feet.  She also continues to have some muscle spasms in her legs and tingling sensation in her feet.  Patient was evaluated by Dr. Marcheta Seta on August 15, 2023.  I reviewed the note.  Patient has shortness of breath on exertion.  According to his note it could be due to physical deconditioning.  He ordered labs and high-resolution CT.  High-resolution CT was negative for ILD.  Hiatal hernia was noted.  A punctate left renal stone was also noted.  She had thyroid  nodule and aortic stenosis with coronary artery calcification. MR abdomen showed scattered pancreatic cysts.  Repeat study was advised in 2 years.  Activities of Daily Living:  Patient reports morning stiffness for 0 minutes.   Patient Denies nocturnal pain.  Difficulty dressing/grooming: Denies Difficulty climbing stairs: Reports Difficulty getting out of chair: Denies Difficulty using hands for taps, buttons, cutlery, and/or writing: Reports  Review of Systems  Constitutional:  Negative for fatigue.  HENT:  Negative for mouth sores and mouth dryness.   Eyes:  Negative for dryness.  Respiratory:  Positive for shortness of breath.   Cardiovascular:  Positive for swelling in legs/feet. Negative for chest pain and palpitations.  Gastrointestinal:  Negative for blood in stool, constipation and diarrhea.  Endocrine: Positive for increased urination.  Genitourinary:  Negative for painful urination and involuntary  urination.  Musculoskeletal:  Positive for gait problem and joint swelling. Negative for joint pain, joint pain, myalgias, muscle weakness, morning stiffness, muscle tenderness and myalgias.  Skin:  Positive for sensitivity to sunlight. Negative for color change, rash and hair loss.  Allergic/Immunologic: Negative for susceptible to infections.  Neurological:  Positive for numbness and parasthesias. Negative for dizziness and headaches.  Hematological:  Negative for swollen glands.  Psychiatric/Behavioral:  Negative for depressed mood and sleep disturbance. The patient is not nervous/anxious.     PMFS History:  Patient Active Problem List   Diagnosis Date Noted   Dysfunction of left eustachian tube 09/25/2023   Abnormal PFT 06/15/2023   Tricuspid regurgitation    Mitral regurgitation    Pulmonary HTN (HCC)    Prediabetes 03/15/2023   History of dehydration 03/15/2023   Osteopenia 09/14/2022   Low back pain with sciatica 08/19/2022   Gait instability 08/19/2022   B12 deficiency 03/14/2022   Hospital discharge follow-up 09/28/2021   Traumatic ecchymosis of head 09/28/2021   Chronic post-traumatic headache, not intractable 09/28/2021   Subdural hematoma (HCC) 09/24/2021   Encephalopathy acute 09/24/2021   Normocytic anemia 09/14/2021   Seasonal allergic rhinitis due to pollen 03/11/2021   DCM (dilated cardiomyopathy) (HCC)    Hyponatremia 06/21/2019   TIA (transient ischemic attack) 06/20/2019   Aortic insufficiency 08/17/2018   Hyperlipidemia    Right thyroid  nodule 02/07/2013   Right carotid bruit 07/16/2012   Ocular migraine 11/04/2011   Hypercholesterolemia 06/01/2011  Essential hypertension    History of syncope    Asthma     Past Medical History:  Diagnosis Date   Allergy    Anemia    in her 20's   Aortic insufficiency    i   Arthritis    not dx'd- just per pt    Asthma    AS A CHILD   Cancer (HCC)    endometrial cancer with hysterectomy in 1987   Cancer  (HCC)    melanoma on face    Cataract    removed both eyes    DCM (dilated cardiomyopathy) (HCC)    EF 50-55%   GERD (gastroesophageal reflux disease)    occ has with diet    History of syncope    HTN (hypertension)    Hyperlipidemia    Mitral regurgitation    mild by echo 03/2023   OSA on CPAP    Pulmonary HTN (HCC)    PASP on echo 03/2023   TIA (transient ischemic attack)    Tricuspid regurgitation    moderate by echo 03/2023    Family History  Problem Relation Age of Onset   Heart disease Mother    Kidney failure Mother    Diabetes Mother    Hyperlipidemia Mother    Hypertension Mother    Uterine cancer Mother    Dementia Father    Diabetes Sister    Multiple sclerosis Sister    Stomach cancer Sister    Dementia Sister    Throat cancer Brother        smoker   Diabetes Paternal Grandmother    Mitral valve prolapse Daughter    Colon cancer Neg Hx    Colon polyps Neg Hx    Esophageal cancer Neg Hx    Rectal cancer Neg Hx    Past Surgical History:  Procedure Laterality Date   ABDOMINAL HYSTERECTOMY     ANKLE FRACTURE SURGERY Left    APPENDECTOMY     CATARACT EXTRACTION, BILATERAL     CHOLECYSTECTOMY     COLONOSCOPY  08/01/2007   Dr Elvin Hammer    DENTAL SURGERY     MELANOMA EXCISION     face   ovary removal     right leg  fracture surgery      SKIN BIOPSY     pre-cancerous on face   Social History   Social History Narrative   Lives at home alone   1 cup of coffee per day    Works at [employer] History  Administered Date(s) Administered   Fluad Quad(high Dose 65+) 05/31/2022   Influenza Inj Mdck Quad With Preservative 06/05/2017   Influenza Split 06/19/2014, 06/05/2017   Influenza, High Dose Seasonal PF 07/16/2018, 08/25/2023   Influenza-Unspecified 05/31/2015, 06/13/2016, 06/23/2020, 07/04/2021   Moderna Sars-Covid-2 Vaccination 08/01/2020   PFIZER(Purple Top)SARS-COV-2 Vaccination 11/06/2019, 11/27/2019   Pneumococcal  Conjugate-13 04/14/2017   Pneumococcal Polysaccharide-23 06/28/2009   Respiratory Syncytial Virus Vaccine,Recomb Aduvanted(Arexvy) 08/25/2023   Tdap 01/06/2023     Objective: Vital Signs: BP (!) 157/71 (BP Location: Left Arm, Patient Position: Sitting, Cuff Size: Large)   Pulse 65   Resp 13   Ht 5\' 3"  (1.6 m)   Wt 194 lb 9.6 oz (88.3 kg)   BMI 34.47 kg/m    Physical Exam Vitals and nursing note reviewed.  Constitutional:      Appearance: She is well-developed.  HENT:     Head: Normocephalic and atraumatic.  Eyes:  Conjunctiva/sclera: Conjunctivae normal.  Cardiovascular:     Rate and Rhythm: Normal rate and regular rhythm.     Heart sounds: Normal heart sounds.  Pulmonary:     Effort: Pulmonary effort is normal.     Breath sounds: Normal breath sounds.  Abdominal:     General: Bowel sounds are normal.     Palpations: Abdomen is soft.  Musculoskeletal:     Cervical back: Normal range of motion.     Right lower leg: Edema present.     Left lower leg: Edema present.  Lymphadenopathy:     Cervical: No cervical adenopathy.  Skin:    General: Skin is warm and dry.     Capillary Refill: Capillary refill takes less than 2 seconds.  Neurological:     Mental Status: She is alert and oriented to person, place, and time.  Psychiatric:        Behavior: Behavior normal.      Musculoskeletal Exam: She had limited lateral rotation and flexion extension of the cervical spine.  Thoracic kyphosis was present without any tenderness.  She had discomfort range of motion of her lumbar spine.  Shoulders, elbows, wrists were in good range of motion.  Bilateral CMC, PIP and DIP thickening with no synovitis was noted.  Hip joints were in good range of motion.  Knee joints were in good range of motion without any warmth swelling or effusion.  There was no tenderness over ankles or MTPs.  CDAI Exam: CDAI Score: -- Patient Global: --; Provider Global: -- Swollen: --; Tender: -- Joint Exam  01/02/2024   No joint exam has been documented for this visit   There is currently no information documented on the homunculus. Go to the Rheumatology activity and complete the homunculus joint exam.  Investigation: No additional findings.  Imaging: MR ABDOMEN MRCP W WO CONTAST Result Date: 12/17/2023 CLINICAL DATA:  Pancreatic cyst EXAM: MRI ABDOMEN WITHOUT AND WITH CONTRAST (INCLUDING MRCP) TECHNIQUE: Multiplanar multisequence MR imaging of the abdomen was performed both before and after the administration of intravenous contrast. Heavily T2-weighted images of the biliary and pancreatic ducts were obtained, and three-dimensional MRCP images were rendered by post processing. CONTRAST:  8mL GADAVIST  GADOBUTROL  1 MMOL/ML IV SOLN COMPARISON:  CT chest dated 10/02/2023 FINDINGS: Motion degraded images. Lower chest: Lung bases are clear. Hepatobiliary: Liver is within normal limits.  No hepatic steatosis. Status post cholecystectomy. No intrahepatic or extrahepatic ductal dilatation. Pancreas: Mild parenchymal atrophy of the body/tail with associate mild main pancreatic ductal dilatation/ectasia. Scattered pancreatic cysts, measuring up to 1.8 cm in the uncinate process (series 22/image 25), favoring side branch IPMNs. Spleen:  Within normal limits. Adrenals/Urinary Tract:  Adrenal glands are within normal limits. Kidneys are normal screening hydronephrosis. Stomach/Bowel: Stomach is notable for a small hiatal hernia. Visualized bowel is unremarkable. Vascular/Lymphatic:  No evidence of abdominal aortic aneurysm. No suspicious abdominal lymphadenopathy. Other:  No abdominal ascites. Musculoskeletal: Mild degenerative changes of the lumbar spine. IMPRESSION: Motion degraded images. Scattered pancreatic cysts, measuring up to 1.8 cm in the uncinate process, favoring benign side branch IPMNs. Given the patient's age greater than 64, a single follow-up MRI abdomen with/without contrast in 2 years is considered  optional. Electronically Signed   By: Zadie Herter M.D.   On: 12/17/2023 23:19   MR 3D Recon At Scanner Result Date: 12/17/2023 CLINICAL DATA:  Pancreatic cyst EXAM: MRI ABDOMEN WITHOUT AND WITH CONTRAST (INCLUDING MRCP) TECHNIQUE: Multiplanar multisequence MR imaging of the abdomen was performed both before  and after the administration of intravenous contrast. Heavily T2-weighted images of the biliary and pancreatic ducts were obtained, and three-dimensional MRCP images were rendered by post processing. CONTRAST:  8mL GADAVIST  GADOBUTROL  1 MMOL/ML IV SOLN COMPARISON:  CT chest dated 10/02/2023 FINDINGS: Motion degraded images. Lower chest: Lung bases are clear. Hepatobiliary: Liver is within normal limits.  No hepatic steatosis. Status post cholecystectomy. No intrahepatic or extrahepatic ductal dilatation. Pancreas: Mild parenchymal atrophy of the body/tail with associate mild main pancreatic ductal dilatation/ectasia. Scattered pancreatic cysts, measuring up to 1.8 cm in the uncinate process (series 22/image 25), favoring side branch IPMNs. Spleen:  Within normal limits. Adrenals/Urinary Tract:  Adrenal glands are within normal limits. Kidneys are normal screening hydronephrosis. Stomach/Bowel: Stomach is notable for a small hiatal hernia. Visualized bowel is unremarkable. Vascular/Lymphatic:  No evidence of abdominal aortic aneurysm. No suspicious abdominal lymphadenopathy. Other:  No abdominal ascites. Musculoskeletal: Mild degenerative changes of the lumbar spine. IMPRESSION: Motion degraded images. Scattered pancreatic cysts, measuring up to 1.8 cm in the uncinate process, favoring benign side branch IPMNs. Given the patient's age greater than 61, a single follow-up MRI abdomen with/without contrast in 2 years is considered optional. Electronically Signed   By: Zadie Herter M.D.   On: 12/17/2023 23:19    Recent Labs: Lab Results  Component Value Date   WBC 7.9 12/06/2023   HGB 11.8 (L)  12/06/2023   PLT 222.0 12/06/2023   NA 137 12/06/2023   K 4.3 12/06/2023   CL 103 12/06/2023   CO2 27 12/06/2023   GLUCOSE 109 (H) 12/06/2023   BUN 18 12/06/2023   CREATININE 0.73 12/06/2023   BILITOT 0.5 12/06/2023   ALKPHOS 72 12/06/2023   AST 17 12/06/2023   ALT 14 12/06/2023   PROT 7.2 12/06/2023   ALBUMIN  4.0 12/06/2023   CALCIUM  9.2 12/06/2023   GFRAA 82 07/10/2020    Speciality Comments: Fosamax  started July 30,2024  Procedures:  No procedures performed Allergies: Lovastatin and Sudafed [pseudoephedrine hcl]   Assessment / Plan:     Visit Diagnoses: Osteopenia of multiple sites - 12/23: DEXA scan left femoral neck -1.9, no comparison.  She is on Fosamax  70 mg weekly since July 2024 which she will continue.  She will get repeat DEXA scan in December 2025 or January 2026 to her GYN.  Use of calcium  rich diet and vitamin D  was advised.  Resistive exercises were advised.  Patient states she is not able to exercise much.  She walks with the help of a cane.  Vitamin D  deficiency -vitamin D  was 41 on March 21, 2023.  Primary osteoarthritis of both hands -she continues to have stiffness in her hands.  No synovitis was noted.  She had bilateral PIP and DIP thickening and Dupuytren's contractures.  History of fracture of left ankle - 1983 after jumping in the swimming pool requiring surgery.  She continues to have chronic discomfort.  Paresthesia of both feet-she has been experiencing paresthesias in her bilateral feet.  She states the numbness from her feet ascends to her calves and her legs.  Will refer her to neurology per patient's request.  She states she has tried physical therapy without much relief.  Spondylosis of lumbar spine - Multilevel spondylosis, L4-L5 with spondylolisthesis and facet joint arthropathy was noted on the x-rays.  Unstable gait - Left ankle fracture 1983, right tibia fracture 1989 after significant falls.  Improvement noted after physical therapy per  patient.    Essential hypertension-blood pressure was elevated at 157/71.  She was advised to monitor blood pressure closely and follow-up with her PCP.  DCM (dilated cardiomyopathy) (HCC)  Pedal edema-she has significant pedal edema on her bilateral lower extremities.  Use of compression socks was advised.  Hypercholesterolemia  TIA (transient ischemic attack)  Subdural hematoma (HCC) - 2023 after a fall.  Nonrheumatic aortic valve insufficiency  Mild intermittent asthma, unspecified whether complicated  Seasonal allergic rhinitis due to pollen  History of melanoma  Right thyroid  nodule  Ocular migraine  Orders: No orders of the defined types were placed in this encounter.  No orders of the defined types were placed in this encounter.    Follow-Up Instructions: Return in about 6 months (around 07/03/2024) for Osteopenia, osteoarthritis.   Nicholas Bari, MD  Note - This record has been created using Animal nutritionist.  Chart creation errors have been sought, but may not always  have been located. Such creation errors do not reflect on  the standard of medical care.

## 2023-12-28 ENCOUNTER — Encounter: Admitting: Internal Medicine

## 2024-01-02 ENCOUNTER — Ambulatory Visit: Payer: Medicare Other | Attending: Rheumatology | Admitting: Rheumatology

## 2024-01-02 ENCOUNTER — Encounter: Payer: Self-pay | Admitting: Rheumatology

## 2024-01-02 VITALS — BP 148/69 | HR 65 | Resp 13 | Ht 63.0 in | Wt 194.6 lb

## 2024-01-02 DIAGNOSIS — M19041 Primary osteoarthritis, right hand: Secondary | ICD-10-CM | POA: Diagnosis not present

## 2024-01-02 DIAGNOSIS — I420 Dilated cardiomyopathy: Secondary | ICD-10-CM

## 2024-01-02 DIAGNOSIS — Z85820 Personal history of malignant melanoma of skin: Secondary | ICD-10-CM

## 2024-01-02 DIAGNOSIS — R600 Localized edema: Secondary | ICD-10-CM

## 2024-01-02 DIAGNOSIS — J301 Allergic rhinitis due to pollen: Secondary | ICD-10-CM

## 2024-01-02 DIAGNOSIS — Z8781 Personal history of (healed) traumatic fracture: Secondary | ICD-10-CM

## 2024-01-02 DIAGNOSIS — M19042 Primary osteoarthritis, left hand: Secondary | ICD-10-CM

## 2024-01-02 DIAGNOSIS — G459 Transient cerebral ischemic attack, unspecified: Secondary | ICD-10-CM

## 2024-01-02 DIAGNOSIS — M47816 Spondylosis without myelopathy or radiculopathy, lumbar region: Secondary | ICD-10-CM

## 2024-01-02 DIAGNOSIS — G43109 Migraine with aura, not intractable, without status migrainosus: Secondary | ICD-10-CM

## 2024-01-02 DIAGNOSIS — M8589 Other specified disorders of bone density and structure, multiple sites: Secondary | ICD-10-CM

## 2024-01-02 DIAGNOSIS — R202 Paresthesia of skin: Secondary | ICD-10-CM

## 2024-01-02 DIAGNOSIS — E041 Nontoxic single thyroid nodule: Secondary | ICD-10-CM

## 2024-01-02 DIAGNOSIS — S065XAA Traumatic subdural hemorrhage with loss of consciousness status unknown, initial encounter: Secondary | ICD-10-CM

## 2024-01-02 DIAGNOSIS — R2681 Unsteadiness on feet: Secondary | ICD-10-CM

## 2024-01-02 DIAGNOSIS — I351 Nonrheumatic aortic (valve) insufficiency: Secondary | ICD-10-CM

## 2024-01-02 DIAGNOSIS — J4520 Mild intermittent asthma, uncomplicated: Secondary | ICD-10-CM

## 2024-01-02 DIAGNOSIS — I10 Essential (primary) hypertension: Secondary | ICD-10-CM

## 2024-01-02 DIAGNOSIS — E7800 Pure hypercholesterolemia, unspecified: Secondary | ICD-10-CM

## 2024-01-02 DIAGNOSIS — E559 Vitamin D deficiency, unspecified: Secondary | ICD-10-CM

## 2024-01-02 NOTE — Addendum Note (Signed)
 Addended by: Dee Farber on: 01/02/2024 11:48 AM   Modules accepted: Orders

## 2024-01-07 NOTE — Progress Notes (Signed)
 This encounter was created in error - please disregard.

## 2024-01-07 NOTE — Patient Instructions (Signed)
 ICD-10-CM   1. Mild asthma without complication, unspecified whether persistent  J45.909 Ambulatory referral to Gastroenterology    2. Chronic cough  R05.3 Ambulatory referral to Gastroenterology    3. Pulmonary air trapping  R09.89     4. House dust mite allergy  Z91.09 Ambulatory referral to Gastroenterology    5. Hiatal hernia  K44.9 Ambulatory referral to Gastroenterology    6. DOE (dyspnea on exertion)  R06.09 Ambulatory referral to Gastroenterology    7. Esophageal thickening  K22.89 Ambulatory referral to Gastroenterology    8. Dysphagia, unspecified type  R13.10 Ambulatory referral to Gastroenterology    9. Pancreatic cyst  K86.2 Ambulatory referral to Gastroenterology    10. Right thyroid nodule  E04.1 Ambulatory referral to Gastroenterology     Mild asthma without complication, unspecified whether persistent -  Chronic cough - Pulmonary air trapping House dust mite allergy -  -Evidence suggest that you have some amount of asthma contributing to your cough and possibly a shortness of breath as well.  Although we do need to keep in mind carvedilol and Zestril might be contributing factors   PLAN - Try sample Trelegy 100 mcg 1 puff once daily for 1 month and see if this makes any difference.  -This might be the simplest of the options or other than stopping Coreg and Zestril which could have adverse implications for your heart. - next visit dscuss dust mite and cat control   DOE (dyspnea on exertion) -obesity, physical deconditioning, diastolic dysfunction.  -This most likely related to weight, physical deconditioning and cardiac muscle stiffness and possibly mild asthma  Plan - Continue to monitor -Talk to primary care physician about weight loss drugs  Hiatal hernia  Esophageal thickening -  Dysphagia, unspecified type - P Pancreatic cyst -    -Radiology is concerned that there might be potential for esophageal cancer  - Plan:  -  Ambulatory referral to  Gastroenterology  Right thyroid nodule -seen on recent CT chest  -Seems you are aware of this problem from the past  Plan: - talk to PCP Doreene Burke Talmadge Coventry, MD    Follow-up - Return to see Dr. Marchelle Gearing in a 15-minute visit or nurse practitioner in 3 months -

## 2024-01-08 ENCOUNTER — Encounter: Payer: Medicare Other | Admitting: Internal Medicine

## 2024-01-08 DIAGNOSIS — R0989 Other specified symptoms and signs involving the circulatory and respiratory systems: Secondary | ICD-10-CM

## 2024-01-08 DIAGNOSIS — R0609 Other forms of dyspnea: Secondary | ICD-10-CM

## 2024-01-08 DIAGNOSIS — J45909 Unspecified asthma, uncomplicated: Secondary | ICD-10-CM

## 2024-01-08 DIAGNOSIS — Z9109 Other allergy status, other than to drugs and biological substances: Secondary | ICD-10-CM

## 2024-01-08 DIAGNOSIS — R053 Chronic cough: Secondary | ICD-10-CM

## 2024-01-08 DIAGNOSIS — K2289 Other specified disease of esophagus: Secondary | ICD-10-CM

## 2024-01-08 DIAGNOSIS — K449 Diaphragmatic hernia without obstruction or gangrene: Secondary | ICD-10-CM

## 2024-01-09 ENCOUNTER — Ambulatory Visit (AMBULATORY_SURGERY_CENTER): Admitting: Internal Medicine

## 2024-01-09 ENCOUNTER — Encounter: Payer: Self-pay | Admitting: Internal Medicine

## 2024-01-09 ENCOUNTER — Telehealth: Payer: Self-pay

## 2024-01-09 VITALS — BP 129/41 | HR 68 | Temp 97.5°F | Resp 17 | Ht 63.0 in | Wt 196.0 lb

## 2024-01-09 DIAGNOSIS — K209 Esophagitis, unspecified without bleeding: Secondary | ICD-10-CM

## 2024-01-09 DIAGNOSIS — K222 Esophageal obstruction: Secondary | ICD-10-CM | POA: Diagnosis not present

## 2024-01-09 DIAGNOSIS — K449 Diaphragmatic hernia without obstruction or gangrene: Secondary | ICD-10-CM | POA: Diagnosis not present

## 2024-01-09 DIAGNOSIS — K2100 Gastro-esophageal reflux disease with esophagitis, without bleeding: Secondary | ICD-10-CM | POA: Diagnosis not present

## 2024-01-09 MED ORDER — PANTOPRAZOLE SODIUM 40 MG PO TBEC: 40.0000 mg | DELAYED_RELEASE_TABLET | Freq: Every day | ORAL | 11 refills | Status: AC

## 2024-01-09 MED ORDER — SODIUM CHLORIDE 0.9 % IV SOLN: 500.0000 mL | Freq: Once | INTRAVENOUS | Status: DC

## 2024-01-09 NOTE — Progress Notes (Signed)
 VS by DT  Pt's states no medical or surgical changes since previsit or office visit.

## 2024-01-09 NOTE — Patient Instructions (Signed)
 Resume previous diet and medications.  Information attached on GERD, esophagitis, and hiatal hernia.  Start Pantoprazole 40mg  daily - prescription sent to the pharmacy.    YOU HAD AN ENDOSCOPIC PROCEDURE TODAY AT THE [facility]:   Refer to the procedure report that was given to you for any specific questions about what was found during the examination.  If the procedure report does not answer your questions, please call your gastroenterologist to clarify.  If you requested that your care partner not be given the details of your procedure findings, then the procedure report has been included in a sealed envelope for you to review at your convenience later.  YOU SHOULD EXPECT: Some feelings of bloating in the abdomen. Passage of more gas than usual.  Walking can help get rid of the air that was put into your GI tract during the procedure and reduce the bloating. If you had a lower endoscopy (such as a colonoscopy or flexible sigmoidoscopy) you may notice spotting of blood in your stool or on the toilet paper. If you underwent a bowel prep for your procedure, you may not have a normal bowel movement for a few days.  Please Note:  You might notice some irritation and congestion in your nose or some drainage.  This is from the oxygen used during your procedure.  There is no need for concern and it should clear up in a day or so.  SYMPTOMS TO REPORT IMMEDIATELY:  Following upper endoscopy (EGD)  Vomiting of blood or coffee ground material  New chest pain or pain under the shoulder blades  Painful or persistently difficult swallowing  New shortness of breath  Fever of 100F or higher  Black, tarry-looking stools  For urgent or emergent issues, a gastroenterologist can be reached at any hour by calling [phone]. Do not use MyChart messaging for urgent concerns.    DIET:  We do recommend a small meal at first, but then you may proceed to your regular diet.  Drink plenty of fluids but  you should avoid alcoholic beverages for 24 hours.  ACTIVITY:  You should plan to take it easy for the rest of today and you should NOT DRIVE or use heavy machinery until tomorrow (because of the sedation medicines used during the test).    FOLLOW UP: Our staff will call the number listed on your records the next business day following your procedure.  We will call around 7:15- 8:00 am to check on you and address any questions or concerns that you may have regarding the information given to you following your procedure. If we do not reach you, we will leave a message.     If any biopsies were taken you will be contacted by phone or by letter within the next 1-3 weeks.  Please call us  at [phone] if you have not heard about the biopsies in 3 weeks.    SIGNATURES/CONFIDENTIALITY: You and/or your care partner have signed paperwork which will be entered into your electronic medical record.  These signatures attest to the fact that that the information above on your After Visit Summary has been reviewed and is understood.  Full responsibility of the confidentiality of this discharge information lies with you and/or your care-partner.

## 2024-01-09 NOTE — Progress Notes (Signed)
 Expand All Collapse All        12/06/2023 Kerry  SHAWNTAY [name] [id number] [birth year]   Referring provider: Maire Scot, MD Primary GI doctor: Dr. Elvin Hammer   ASSESSMENT AND PLAN:    Chronic cough, esophageal thickening seen on CT chest high-resolution 10/02/2023 for SOB History of GERD remote has not had GERD for years, some hoarseness and SOB, no AB pain One episode of dysphagia with rice in Dec, otherwise no dysphagia No NSAIDS, no ETOH, no smoking history Possible LPR, rule out malignancy Has been on fosamax  x 1 year, once a week -consider alternative for fosamax  - start on pepcid 40 mg at night, consider PPI after EGD - EGD to evaluate esophagitis, rule out malignancy.  - likely okay for LEC but will send note to [facility] for chart evaluation I discussed risks of EGD with patient today, including risk of sedation, bleeding or perforation.  Patient provides understanding and gave verbal consent to proceed.   Pancreatic cyst with mild pancreatic ductal dilation, 1.5 cm Seen on CT chest high-resolution 10/02/2023 S/p cholecystectomy No ETOH, no family history of pancreatitis, no personal history of pancreatitis Needs further imaging - will order MRCP to evaluate further, possible IPMN - check CA19-9, lipase   Fecal incontinence Smear loose stools, hard stools and decreased rectal tone on exam Likely pelvic floor Will do bowel purge and then miralax/fiber add on fiber/miralax   Personal history of colon polyp 05/07/2019 colonoscopy Dr. Elvin Hammer 1 mm polyp cecum, diverticula left and right colon otherwise unremarkable, benign polyp no further screening colonoscopies recommended due to age.   Shortness of breath with mild pulmonary hypertension 03/2023 echo EF 50 to 55% grade 1 diastolic dysfunction mildly elevated pulmonary artery systolic pressure RVSP 40.7 moderate TR no aortic stenosis but mild to moderate aortic regurgitation-pending repeat study VQ scan negative CT chest  high-resolution no interstitial lung disease - suggest sleep study, was on CPAP prior - getting repeat echo in July -Not on O2 - should be appropriate for LEC   Anemia normocytic 11/06/2023  HGB 12.0 MCV 92.4 Platelets 223.0 11/06/2023 Iron  105 Ferritin 36 started on iron  after the Feb blood draw ,no iron  def at that time. Can do trial off B12 637-  Recent Labs (within last 365 days)        Recent Labs    03/08/23 2222 03/15/23 0843 08/15/23 1622 11/06/23 1058  HGB 10.9* 11.8* 12.0 12.0            Patient Care Team: Tonna Frederic, MD as PCP - General (Family Medicine) Merryl Abraham, MD as Consulting Physician (Obstetrics and Gynecology) Jacqueline Matsu, MD as Consulting Physician (Cardiology) [facility]   HISTORY OF PRESENT ILLNESS: 83 y.o. female with a past medical history listed below, known to Dr. Elvin Hammer presents with chronic cough, hiatal hernia esophageal thickening dysphagia and pancreatic cysts.   Discussed the use of AI scribe software for clinical note transcription with the patient, who gave verbal consent to proceed.   History of Present Illness   Kerry [name] is an 83 year old female who presents with esophageal inflammation and a pancreatic cyst.   She was unaware of her current medical issues until a CT scan revealed esophageal thickening and a pancreatic cyst measuring 1.5 cm. She has a history of a benign polyp and diverticula found during a colonoscopy in 2020, with no further recommendations unless symptoms like bleeding occur. She used to have frequent heartburn and relied on Tums, but  has not experienced heartburn for years. She recalls a single incident in December where she had difficulty swallowing rice, but it resolved without further issues. No current reflux, heartburn, or epigastric discomfort. She started an iron  supplement three to four weeks ago due to a blood test indicating iron  deficiency. She has a history of anemia  with a hemoglobin level of 10.1 in 2023, but her B12 levels are normal.   She experiences a chronic cough that is not present every day but persists over time. She notes a scratchy throat that started recently, which she attributes to allergies. She takes Allegra daily for allergies and asthma, and denies using Aleve, ibuprofen, or steroids.   She experiences fecal incontinence, describing it as 'leaking' after using the bathroom, with stools being soft and mushy. She uses Imodium occasionally, which results in not having bowel movements for two to three days. No dark or black stools except since starting iron  supplements. She uses a cane occasionally due to balance issues and reports swelling in her legs and feet, particularly in her big toe, describing it as feeling like 'carrying a cement bag.'   She reports shortness of breath, particularly when walking fast, and uses Halls lozenges to help. No chest discomfort and is not on oxygen. She previously used a CPAP for about a year but stopped due to an injury. She sleeps about six hours a night, waking to use the bathroom, and sometimes returns to sleep for a couple more hours.   She denies alcohol use, describing herself as a 'cheap drunk.' She has a history of gallbladder removal in the 1990s and denies any family history of gastric or pancreatic issues. Father had gallbladder issues. She reports numbness and tingling in her legs and feet, sometimes accompanied by pain, which starts at her feet and moves upward. No restlessness in her legs.           She  reports that she has never smoked. She has been exposed to tobacco smoke. She has never used smokeless tobacco. She reports that she does not drink alcohol and does not use drugs.   RELEVANT GI HISTORY, IMAGING AND LABS: Results   LABS Iron : 105 (10/2023) Ferritin: 36 (10/2023) TIBC: 372 (10/2023) Saturation: 28 (10/2023) B12: Normal (10/2023) Hb: 12 (10/2023) Hb: 10.1 (2023) MCV: 92    RADIOLOGY Chest CT: Esophageal thickening, esophageal inflammation, 1.5 cm pancreatic cyst CT: No interstitial lung disease, small airway asthma   DIAGNOSTIC Colonoscopy: 1 mm benign polyp, diverticula (2020) Echocardiogram: Normal VQ scan: Negative for pulmonary embolism       CBC Labs (Brief)          Component Value Date/Time    WBC 7.6 11/06/2023 1058    RBC 3.79 (L) 11/06/2023 1058    HGB 12.0 11/06/2023 1058    HGB 11.6 07/10/2020 1209    HCT 35.0 (L) 11/06/2023 1058    HCT 36.4 07/10/2020 1209    PLT 223.0 11/06/2023 1058    PLT 311 06/27/2019 1006    MCV 92.4 11/06/2023 1058    MCV 95 07/10/2020 1209    MCH 31.0 03/08/2023 2222    MCHC 34.2 11/06/2023 1058    RDW 12.7 11/06/2023 1058    RDW 11.5 (L) 07/10/2020 1209    LYMPHSABS 1.7 11/06/2023 1058    LYMPHSABS 1.9 07/10/2020 1209    MONOABS 0.7 11/06/2023 1058    EOSABS 0.2 11/06/2023 1058    EOSABS 0.4 07/10/2020 1209    BASOSABS 0.0 11/06/2023  1058    BASOSABS 0.1 07/10/2020 1209      Recent Labs (within last 365 days)        Recent Labs    03/08/23 2222 03/15/23 0843 08/15/23 1622 11/06/23 1058  HGB 10.9* 11.8* 12.0 12.0        CMP     Labs (Brief)          Component Value Date/Time    NA 134 (L) 11/06/2023 1058    NA 139 07/10/2020 1209    K 4.5 11/06/2023 1058    CL 100 11/06/2023 1058    CO2 26 11/06/2023 1058    GLUCOSE 95 11/06/2023 1058    BUN 22 11/06/2023 1058    BUN 25 07/10/2020 1209    CREATININE 0.72 11/06/2023 1058    CREATININE 0.84 04/28/2023 1530    CALCIUM  9.0 11/06/2023 1058    PROT 7.2 03/21/2023 0954    PROT 7.1 07/10/2020 1209    ALBUMIN  4.0 03/15/2023 0843    ALBUMIN  4.1 07/10/2020 1209    AST 21 03/15/2023 0843    ALT 16 03/15/2023 0843    ALKPHOS 98 03/15/2023 0843    BILITOT 0.8 03/15/2023 0843    BILITOT 0.4 07/10/2020 1209    GFRNONAA 57 (L) 03/08/2023 2222    GFRAA 82 07/10/2020 1209          Latest Ref Rng & Units 03/21/2023    9:54 AM 03/15/2023     8:43 AM 03/08/2023   10:22 PM  Hepatic Function  Total Protein 6.1 - 8.1 g/dL 7.2  7.2  6.5   Albumin  3.5 - 5.2 g/dL   4.0  3.3   AST 0 - 37 U/L   21  23   ALT 0 - 35 U/L   16  16   Alk Phosphatase 39 - 117 U/L   98  81   Total Bilirubin 0.2 - 1.2 mg/dL   0.8  0.8       Current Medications:    Current Outpatient Medications (Endocrine & Metabolic):    alendronate  (FOSAMAX ) 70 MG tablet, TAKE 1 TABLET(70 MG) BY MOUTH 1 TIME A WEEK WITH A FULL GLASS OF WATER AND ON AN EMPTY STOMACH   Current Outpatient Medications (Cardiovascular):    atorvastatin  (LIPITOR) 20 MG tablet, TAKE 1 TABLET(20 MG) BY MOUTH DAILY   carvedilol  (COREG ) 12.5 MG tablet, TAKE 1 TABLET(12.5 MG) BY MOUTH TWICE DAILY. KEEP APPOINTMENT FOR FURTHER REFILLS   hydrochlorothiazide  (HYDRODIURIL ) 25 MG tablet, Take 1 tablet (25 mg total) by mouth daily.   lisinopril  (ZESTRIL ) 20 MG tablet, Take 1 tablet (20 mg total) by mouth daily.   Current Outpatient Medications (Respiratory):    fexofenadine (ALLEGRA) 180 MG tablet, Take 180 mg by mouth every morning.   fluticasone  (FLONASE ) 50 MCG/ACT nasal spray, Place 2 sprays into both nostrils daily.   Fluticasone -Umeclidin-Vilant (TRELEGY ELLIPTA) 100-62.5-25 MCG/ACT AEPB, Inhale 1 puff into the lungs as needed (as needed). (Patient not taking: Reported on 12/06/2023)   Current Outpatient Medications (Analgesics):    acetaminophen  (TYLENOL ) 500 MG tablet, Take 500 mg by mouth every 6 (six) hours as needed for moderate pain or headache.    aspirin  81 MG chewable tablet, Chew 81 mg by mouth every other day.   Current Outpatient Medications (Hematological):    Cyanocobalamin  500 MCG CHEW, Take one daily   Iron , Ferrous Sulfate , 325 (65 Fe) MG TABS, Take 325 mg by mouth every other day.   Current Outpatient Medications (  Other):    Ascorbic Acid (VITAMIN C PO), Take 1 tablet by mouth daily.   Cholecalciferol (VITAMIN D3) 2000 units capsule, Take 2,000 Units by mouth daily.    Coenzyme Q10-Vitamin E (QUNOL ULTRA COQ10 PO), Take 1 capsule by mouth daily.   Misc Natural Products (NEURIVA PO), Take 1 tablet by mouth daily.   Multiple Vitamin (MULTIVITAMIN WITH MINERALS) TABS tablet, Take 1 tablet by mouth daily.   Polyethyl Glycol-Propyl Glycol (SYSTANE) 0.4-0.3 % SOLN, Place 1 drop into both eyes daily as needed (dry eyes).   Medical History:      Past Medical History:  Diagnosis Date   Allergy     Anemia      in her 20's   Aortic insufficiency      i   Arthritis      not dx'd- just per pt    Asthma      AS A CHILD   Cancer (HCC)      endometrial cancer with hysterectomy in 1987   Cancer (HCC)      melanoma on face    Cataract      removed both eyes    DCM (dilated cardiomyopathy) (HCC)      EF 50-55%   GERD (gastroesophageal reflux disease)      occ has with diet    History of syncope     HTN (hypertension)     Hyperlipidemia     Mitral regurgitation      mild by echo 03/2023   OSA on CPAP     Pulmonary HTN (HCC)      PASP on echo 03/2023   TIA (transient ischemic attack)     Tricuspid regurgitation      moderate by echo 03/2023        Allergies:  Allergies       Allergies  Allergen Reactions   Lovastatin Other (See Comments)      myalgias   Sudafed [Pseudoephedrine Hcl] Other (See Comments)      Hallucinations          Surgical History:  She  has a past surgical history that includes Appendectomy; Cholecystectomy; Abdominal hysterectomy; Cataract extraction, bilateral; Colonoscopy (08/01/2007); Ankle fracture surgery (Left); right leg  fracture surgery ; Dental surgery; ovary removal; Skin biopsy; and Melanoma excision. Family History:  Her family history includes Dementia in her father and sister; Diabetes in her mother, paternal grandmother, and sister; Heart disease in her mother; Hyperlipidemia in her mother; Hypertension in her mother; Kidney failure in her mother; Mitral valve prolapse in her daughter; Multiple sclerosis  in her sister; Stomach cancer in her sister; Throat cancer in her brother; Uterine cancer in her mother.   REVIEW OF SYSTEMS  : All other systems reviewed and negative except where noted in the History of Present Illness.   PHYSICAL EXAM: BP 130/62   Pulse 73   Ht 5\' 3"  (1.6 m)   Wt 196 lb (88.9 kg)   BMI 34.72 kg/m  Physical Exam   GENERAL APPEARANCE: Well nourished, in no apparent distress. HEENT: No cervical lymphadenopathy, unremarkable thyroid , sclerae anicteric, conjunctiva pink. RESPIRATORY: Respiratory effort normal, breath sounds clear to auscultation bilaterally without rales, rhonchi, or wheezing. CARDIO: Regular rate and rhythm with no murmurs, rubs, or gallops, peripheral pulses intact. ABDOMEN: Soft, non-distended, active bowel sounds in all four quadrants, non-tender to palpation, no rebound, no mass appreciated. RECTAL: Decreased rectal tone, constipation with hard stool, left internal hemorrhoid observed. MUSCULOSKELETAL: Full range of  motion, normal gait, without edema. SKIN: Dry, intact without rashes or lesions. No jaundice. NEURO: Alert, oriented, no focal deficits. PSYCH: Cooperative, normal mood and affect. NECK: No cervical lymphadenopathy, thyroid  normal. EXTREMITIES: Bilateral leg swelling, non-pitting.       Edmonia Gottron, PA-C 10:33 AM

## 2024-01-09 NOTE — Op Note (Signed)
 [facility] Patient Name: Kerry [name] Procedure Date: 01/09/2024 10:46 AM MRN: [id number] Endoscopist: Murel Arlington. Elvin Hammer , MD, [id number] Age: 83 Referring MD:  Date of Birth: 1941-07-13 Gender: Female Account #: [id number] Procedure:                Upper GI endoscopy Indications:              Esophageal reflux, Abnormal CT of the GI tract,                            cough Medicines:                Monitored Anesthesia Care Procedure:                Pre-Anesthesia Assessment:                           - Prior to the procedure, a History and Physical                            was performed, and patient medications and                            allergies were reviewed. The patient's tolerance of                            previous anesthesia was also reviewed. The risks                            and benefits of the procedure and the sedation                            options and risks were discussed with the patient.                            All questions were answered, and informed consent                            was obtained. Prior Anticoagulants: The patient has                            taken no anticoagulant or antiplatelet agents. ASA                            Grade Assessment: II - A patient with mild systemic                            disease. After reviewing the risks and benefits,                            the patient was deemed in satisfactory condition to                            undergo the procedure.  After obtaining informed consent, the endoscope was                            passed under direct vision. Throughout the                            procedure, the patient's blood pressure, pulse, and                            oxygen saturations were monitored continuously. The                            GIF [id number] #[id number] was introduced through the                            mouth, and advanced to the second part of duodenum.                             The upper GI endoscopy was accomplished without                            difficulty. The patient tolerated the procedure                            well. Scope In: Scope Out: Findings:                 The esophagus revealed mild distal esophagitis and                            early stricture formation. No Barrett's.                           The stomach revealed a moderate Hernia. No                            additional significant abnormalities.                           The examined duodenum was normal.                           The cardia and gastric fundus were normal on                            retroflexion. Complications:            No immediate complications. Estimated Blood Loss:     Estimated blood loss: none. Impression:               1. GERD with esophagitis and mild stricture                           2. Hiatal hernia                           3. Otherwise unremarkable EGD. Recommendation:           -  Patient has a contact number available for                            emergencies. The signs and symptoms of potential                            delayed complications were discussed with the                            patient. Return to normal activities tomorrow.                            Written discharge instructions were provided to the                            patient.                           - Resume previous diet.                           - Continue present medications.                           - PRESCRIBE PANTOPRAZOLE 40 mg daily; #30; 11                            refills. This medication will heal and protect the                            esophagus. Murel Arlington. Elvin Hammer, MD 01/09/2024 10:58:59 AM This report has been signed electronically.

## 2024-01-09 NOTE — Telephone Encounter (Signed)
 Copied from CRM [id number]. Topic: Clinical - Prescription Issue >> Jan 08, 2024 11:24 AM [name] wrote: Reason for CRM: patient states she never got her trelogy prescription and would like to speak to a nurse about it.   Lm on PT VM okay per HIPAA, that it looks like they wanted her to try it first and gave her a months worth sample

## 2024-01-09 NOTE — Progress Notes (Signed)
 Sedate, gd SR, tolerated procedure well, VSS, report to RN

## 2024-01-10 ENCOUNTER — Telehealth: Payer: Self-pay

## 2024-01-10 ENCOUNTER — Telehealth: Payer: Self-pay | Admitting: *Deleted

## 2024-01-10 NOTE — Telephone Encounter (Signed)
  Follow up Call-     01/09/2024    9:57 AM  Call back number  Post procedure Call Back phone  # [phone]  Permission to leave phone message Yes     Patient questions:  Do you have a fever, pain , or abdominal swelling? No. Pain Score  0 *  Have you tolerated food without any problems? Yes.    Have you been able to return to your normal activities? Yes.    Do you have any questions about your discharge instructions: Diet   No. Medications  No. Follow up visit  No.  Do you have questions or concerns about your Care? No.  Actions: * If pain score is 4 or above: No action needed, pain <4.

## 2024-01-17 ENCOUNTER — Telehealth (HOSPITAL_BASED_OUTPATIENT_CLINIC_OR_DEPARTMENT_OTHER): Payer: Self-pay

## 2024-01-17 MED ORDER — TRELEGY ELLIPTA 100-62.5-25 MCG/ACT IN AEPB: 1.0000 | INHALATION_SPRAY | Freq: Every day | RESPIRATORY_TRACT | 6 refills | Status: DC

## 2024-01-17 NOTE — Telephone Encounter (Signed)
 I called the pt and there was no answer- LMTCB. ?

## 2024-01-17 NOTE — Telephone Encounter (Signed)
 Rx was sent to pharmacy and pt notified

## 2024-01-17 NOTE — Telephone Encounter (Signed)
 Copied from CRM [id number]. Topic: Clinical - Medication Question >> Jan 17, 2024  2:10 PM Hilton Lucky wrote: Reason for CRM: Patient returning a call from [place]. Patient states the sample of Trelegy worked Adult nurse and she would like to move forward with that as a prescription.

## 2024-01-17 NOTE — Telephone Encounter (Signed)
 She can try the Trelegy.  She can also prescription of Trelegy.  If she wants a prescription of Trelegy please let me know or you can send it yourself

## 2024-01-24 ENCOUNTER — Telehealth: Payer: Self-pay | Admitting: Family Medicine

## 2024-01-24 NOTE — Telephone Encounter (Signed)
 ERROR

## 2024-01-25 NOTE — Telephone Encounter (Signed)
 Copied from CRM [id number]. Topic: Appointments - Appointment Cancel/Reschedule >> Jan 25, 2024  9:06 AM Clydene Darner H wrote: Patient received a call regarding rescheduling original appointment for 08.25.25. Patient is now scheduled for 03/12/24.

## 2024-01-31 ENCOUNTER — Other Ambulatory Visit: Payer: Self-pay | Admitting: Cardiology

## 2024-02-25 ENCOUNTER — Other Ambulatory Visit: Payer: Self-pay | Admitting: Cardiology

## 2024-02-26 ENCOUNTER — Ambulatory Visit: Admitting: Internal Medicine

## 2024-02-26 ENCOUNTER — Encounter: Payer: Self-pay | Admitting: Internal Medicine

## 2024-02-26 VITALS — BP 122/58 | HR 71 | Ht 63.0 in | Wt 196.8 lb

## 2024-02-26 DIAGNOSIS — Z7722 Contact with and (suspected) exposure to environmental tobacco smoke (acute) (chronic): Secondary | ICD-10-CM

## 2024-02-26 DIAGNOSIS — R0609 Other forms of dyspnea: Secondary | ICD-10-CM

## 2024-02-26 DIAGNOSIS — Z9109 Other allergy status, other than to drugs and biological substances: Secondary | ICD-10-CM

## 2024-02-26 DIAGNOSIS — R053 Chronic cough: Secondary | ICD-10-CM

## 2024-02-26 DIAGNOSIS — J45909 Unspecified asthma, uncomplicated: Secondary | ICD-10-CM | POA: Diagnosis not present

## 2024-02-26 DIAGNOSIS — I5189 Other ill-defined heart diseases: Secondary | ICD-10-CM

## 2024-02-26 MED ORDER — FLUTICASONE FUROATE-VILANTEROL 100-25 MCG/ACT IN AEPB: 1.0000 | INHALATION_SPRAY | Freq: Every day | RESPIRATORY_TRACT | Status: AC

## 2024-02-26 NOTE — Patient Instructions (Addendum)
 ICD-10-CM   1. Chronic cough  R05.3 fluticasone  furoate-vilanterol (BREO ELLIPTA) 100-25 MCG/ACT 1 puff    2. Mild asthma without complication, unspecified whether persistent  J45.909 fluticasone  furoate-vilanterol (BREO ELLIPTA) 100-25 MCG/ACT 1 puff    3. House dust mite allergy  Z91.09 fluticasone  furoate-vilanterol (BREO ELLIPTA) 100-25 MCG/ACT 1 puff       Mild asthma without complication, unspecified whether persistent -  Chronic cough - Pulmonary air trapping House dust mite allergy -  -MUCH improved with TRELEGY.  - Evidence suggest that you have allergic asthma with dust mite and cats contributing to this -  Although we do need to keep in mind carvedilol  and Zestril  might be contributing factors   PLAN -Chagne TRELEGY to BREO 100   - if expensive call us   -This might be the simplest of the options or other than stopping Coreg  and Zestril  which could have adverse implications for your heart. - Avoid getting new cat(s) - control dust mite at home (advised)    DOE (dyspnea on exertion) -obesity, physical deconditioning, diastolic dysfunction.  -This most likely related to weight, physical deconditioning and cardiac muscle stiffness and possibly mild asthma  Plan - Continue to monitor -Talk to primary care physician about weight loss drugs - Refer pulmonary rehabilitation  Hiatal hernia  Esophageal thickening -  Dysphagia, unspecified type - P Pancreatic cyst -    -Radiology is concerned that there might be potential for esophageal cancer -> glad you saw Dr Elvin Hammer and endoscopy was reassuring  - Plan:  -  Per GI    Follow-up - Return to see nurse practitioner in 3 months - 6 months -

## 2024-02-26 NOTE — Progress Notes (Signed)
 OV 08/15/2023  Subjective:  Patient ID: Kerry [name]  Kerry [name], female , DOB: 01-Apr-1941 , age 83 y.o. , MRN: [id number] , ADDRESS: [address] [name] [address] PCP Tonna Frederic, MD Patient Care Team: Tonna Frederic, MD as PCP - General (Family Medicine) Merryl Abraham, MD as Consulting Physician (Obstetrics and Gynecology) Jacqueline Matsu, MD as Consulting Physician (Cardiology) [facility]  This Provider for this visit: Treatment Team:  Attending Provider: Maire Scot, MD    08/15/2023 -   Chief Complaint  Patient presents with   Consult    IDS, having sob when talking, and sob when rushing , not able walk for exercise like she use too.      HPI Kerry  F [name] 83 y.o. -83 year old widow.  She lives alone.  She is originally from [place] .  She joined her husband here in 1984.  And then she relocated back to [place]  in 1993 and then relocated back to [place] in 2000.  Shortly after she relocated here her husband was a heavy smoker developed lung cancer and passed away.  Since then she has been living alone she has 2 children one of them lives in [place] the other is relocated back to [place] .  She tells me she had asthma as a child but she outgrew that and she has been on no medications for 30 years.  She only deals with some seasonal allergies for which she takes over-the-counter antihistamine as needed.  Current problems shortness of breath for the last 5 years.  Recently pulmonary function test was abnormal [restriction with low DLCO] and therefore referred here.  She says it is progressive.  She states when she walks around for a while or sometimes when she takes a shower and changes close she will feel short of breath.  Associated with that she also has a cough for the last 5 years.  The cough is much milder and its only that once in a while.  This is 1-2 times a month.  She does bring some green sputum  with it.  There is no wheezing with this.  However exhaled nitric oxide  today slightly elevated 40 ppb.  She does have childhood history of asthma.  She does not have any orthopnea but she does sleep in a recliner because of her back issues.  She is been doing this for 18 months.  She does not know if she snores because she lives alone.  She is not smoker but I believe she was passively smoking up until 24 years ago when her husband died from lung cancer.   Cardiac workup reveals -2019 cardiac stress test with a low risk -July 2024 cardiac echo which is stable with EF 50-55% grade 1 diastolic dysfunction and elevated pulmonary pressures  Other -Normal autoimmune workup in 2024 -Normal kidney function August 2024 -Mild anemia in July 2024 which is chronic and 11 g% approximate   CT Chest data from date:  10/21/19 CT CORONARIES  - personally visualized and independently interpreted : yes - my findings are: agree Narrative & Impression  EXAM: OVER-READ INTERPRETATION  CT CHEST   The following report is an over-read performed by radiologist Dr. Erica Hau of [facility] on 10/21/2019. This over-read does not include interpretation of cardiac or coronary anatomy or pathology. The coronary CTA interpretation by the cardiologist is attached.   COMPARISON:  None.   FINDINGS: Vascular: No incidental findings.   Mediastinum/Nodes: Visualized mediastinum and hilar  regions demonstrate no masses or lymphadenopathy. There is a moderate-sized hiatal hernia.   Lungs/Pleura: Visualized lungs show no evidence of pulmonary edema, consolidation, pneumothorax, nodule or pleural fluid.   Upper Abdomen: No acute abnormality.   Musculoskeletal: No chest wall mass or suspicious bone lesions identified.   IMPRESSION: Moderate hiatal hernia.   Electronically Signed: By: Erica Hau M.D. On: 10/21/2019 08:21     OV 10/05/2023  Subjective:  Patient ID: [name]  Kerry [name],  female , DOB: 1941-01-24 , age 36 y.o. , MRN: [id number] , ADDRESS: [address] [name] [address] PCP Tonna Frederic, MD Patient Care Team: Tonna Frederic, MD as PCP - General (Family Medicine) Merryl Abraham, MD as Consulting Physician (Obstetrics and Gynecology) Jacqueline Matsu, MD as Consulting Physician (Cardiology) [facility]  This Provider for this visit: Treatment Team:  Attending Provider: Maire Scot, MD    10/05/2023 -   Chief Complaint  Patient presents with   Follow-up    Pt states she is feeling the same, sob during exertion     HPI Kerry  F [name] 83 y.o. -returns for follow-up.  She tells me that her shortness of breath is still ongoing.  She does not recall us  giving the Trelegy and I do not know if he actually gave it to her.  Her cough is not that significant but her shortness of breath is more significant.  The cough is only occasional.  Shortness with this present with exertion such as climbing a flight of stairs or going up the hill.  She does use a cane she is obese she is physically deconditioned.  She is also on Coreg  and Zestril .  Nevertheless workup does show that her lung parenchyma is clean but she does have air trapping.  Her blood allergy test is positive for dust mites and cat dander.  She does have a cat.  Her pulmonary function test shows mild restriction.  Of note her Coy Ditty was borderline elevated last time.  Her BNP slightly elevated at 186.  An echo last July showed diastolic dysfunction grade 1.   Of note her CT chest shows multiple incidental findings that include esophageal thickening.  She is now reporting dysphagia for solids intermittently occasionally once a week for the last 1 month.  She also has punctate renal stone.  She has a thyroid  nodule.  She has pancreatic cyst.  I shared all these results with her.    OV 02/26/2024  Subjective:  Patient ID: Kerry  Kerry [name], female , DOB:  04-Dec-1940 , age 61 y.o. , MRN: [id number] , ADDRESS: [address] [name] [address] PCP Tonna Frederic, MD Patient Care Team: Tonna Frederic, MD as PCP - General (Family Medicine) Merryl Abraham, MD as Consulting Physician (Obstetrics and Gynecology) Jacqueline Matsu, MD as Consulting Physician (Cardiology) [facility]  This Provider for this visit: Treatment Team:  Attending Provider: Maire Scot, MD    02/26/2024 -   Chief Complaint  Patient presents with   Follow-up    Breathing is overall doing. She rarely has cough-when she does it's dry.      HPI Renay  F Bula 83 y.o. -presents for follow-up.  She was diagnosed with asthma [allergy] with cat exposure and also house dust mite allergy.  Other factors contributing to her cough included carvedilol  and also Zestril  for cardiac conditions.  Benefit is to keep her cardiac medicines on board but introduce Trelegy as a sample.  She tells me now that coughing every day she is only coughing every.  She does have some right occasionally because of the Trelegy but it is tolerable.  Shortness of breath also improved a lot but she does get short of breath while taking a shower when changing clothes.  She has obesity and diastolic dysfunction.  Although overall shortness of breath is improved.  Of note she did have esophageal thickening on the CT scan she has had endoscopy based on Medical record review.  This was done by Dr. Elvin Hammer and she has been reassured.  Of note she is on Trelegy which we started because of a sample.  This initially causative $100.  It is now $45 a month.  She is willing to downsize to [facility].  Have sent a prescription for this.    Asthma Control Test ACT Total Score  02/26/2024  9:19 AM 25     No results found for: NITRICOXIDE   PFT     Latest Ref Rng & Units 05/29/2023    7:45 AM  PFT Results  FVC-Pre L 1.87   FVC-Predicted Pre % 76   FVC-Post L 1.97    FVC-Predicted Post % 80   Pre FEV1/FVC % % 77   Post FEV1/FCV % % 77   FEV1-Pre L 1.45   FEV1-Predicted Pre % 79   FEV1-Post L 1.51   DLCO uncorrected ml/min/mmHg 9.88   DLCO UNC% % 54   DLVA Predicted % 73   TLC L 4.43   TLC % Predicted % 90   RV % Predicted % 105        LAB RESULTS last 96 hours No results found.       has a past medical history of Allergy, Anemia, Aortic insufficiency, Arthritis, Asthma, Cancer (HCC), Cancer (HCC), Cataract, DCM (dilated cardiomyopathy) (HCC), GERD (gastroesophageal reflux disease), History of syncope, HTN (hypertension), Hyperlipidemia, Mitral regurgitation, OSA on CPAP, Pulmonary HTN (HCC), TIA (transient ischemic attack), and Tricuspid regurgitation.   reports that she has never smoked. She has been exposed to tobacco smoke. She has never used smokeless tobacco.  Past Surgical History:  Procedure Laterality Date   ABDOMINAL HYSTERECTOMY     ANKLE FRACTURE SURGERY Left    APPENDECTOMY     CATARACT EXTRACTION, BILATERAL     CHOLECYSTECTOMY     COLONOSCOPY  08/01/2007   Dr Elvin Hammer    DENTAL SURGERY     MELANOMA EXCISION     face   ovary removal     right leg  fracture surgery      SKIN BIOPSY     pre-cancerous on face    Allergies  Allergen Reactions   Lovastatin Other (See Comments)    myalgias   Sudafed [Pseudoephedrine Hcl] Other (See Comments)    Hallucinations     Immunization History  Administered Date(s) Administered   Fluad Quad(high Dose 65+) 05/31/2022   Fluzone Influenza virus vaccine,trivalent (IIV3), split virus 06/05/2017   Influenza Inj Mdck Quad With Preservative 06/05/2017   Influenza Split 06/19/2014   Influenza, High Dose Seasonal PF 07/16/2018, 08/25/2023   Influenza-Unspecified 05/31/2015, 06/13/2016, 06/23/2020, 07/04/2021   Moderna Sars-Covid-2 Vaccination 08/01/2020   PFIZER(Purple Top)SARS-COV-2 Vaccination 11/06/2019, 11/27/2019   Pneumococcal Conjugate-13 04/14/2017   Pneumococcal  Polysaccharide-23 06/28/2009   Respiratory Syncytial Virus Vaccine,Recomb Aduvanted(Arexvy) 08/25/2023   Tdap 01/06/2023    Family History  Problem Relation Age of Onset   Heart disease Mother    Kidney failure Mother    Diabetes Mother  Hyperlipidemia Mother    Hypertension Mother    Uterine cancer Mother    Dementia Father    Diabetes Sister    Multiple sclerosis Sister    Stomach cancer Sister    Dementia Sister    Throat cancer Brother        smoker   Diabetes Paternal Grandmother    Mitral valve prolapse Daughter    Colon cancer Neg Hx    Colon polyps Neg Hx    Esophageal cancer Neg Hx    Rectal cancer Neg Hx      Current Outpatient Medications:    acetaminophen  (TYLENOL ) 500 MG tablet, Take 500 mg by mouth every 6 (six) hours as needed for moderate pain or headache. , Disp: , Rfl:    alendronate  (FOSAMAX ) 70 MG tablet, TAKE 1 TABLET(70 MG) BY MOUTH 1 TIME A WEEK WITH A FULL GLASS OF WATER AND ON AN EMPTY STOMACH, Disp: 12 tablet, Rfl: 0   Ascorbic Acid (VITAMIN C PO), Take 1 tablet by mouth daily., Disp: , Rfl:    aspirin  81 MG chewable tablet, Chew 81 mg by mouth every other day., Disp: , Rfl:    atorvastatin  (LIPITOR) 20 MG tablet, TAKE 1 TABLET(20 MG) BY MOUTH DAILY, Disp: 90 tablet, Rfl: 2   carvedilol  (COREG ) 12.5 MG tablet, TAKE 1 TABLET(12.5 MG) BY MOUTH TWICE DAILY. KEEP APPOINTMENT FOR FURTHER REFILLS, Disp: 180 tablet, Rfl: 3   Cholecalciferol (VITAMIN D3) 2000 units capsule, Take 2,000 Units by mouth daily., Disp: , Rfl:    Coenzyme Q10-Vitamin E (QUNOL ULTRA COQ10 PO), Take 1 capsule by mouth daily., Disp: , Rfl:    Cyanocobalamin  500 MCG CHEW, Take one daily, Disp: 90 tablet, Rfl: 1   fexofenadine (ALLEGRA) 180 MG tablet, Take 180 mg by mouth every morning., Disp: , Rfl:    fluticasone  (FLONASE ) 50 MCG/ACT nasal spray, Place 2 sprays into both nostrils daily., Disp: 16 g, Rfl: 6   hydrochlorothiazide  (HYDRODIURIL ) 25 MG tablet, Take 1 tablet (25 mg total)  by mouth daily., Disp: 90 tablet, Rfl: 3   Iron , Ferrous Sulfate , 325 (65 Fe) MG TABS, Take 325 mg by mouth every other day., Disp: 45 tablet, Rfl: 3   lisinopril  (ZESTRIL ) 20 MG tablet, Take 1 tablet (20 mg total) by mouth daily., Disp: 90 tablet, Rfl: 3   Misc Natural Products (NEURIVA PO), Take 1 tablet by mouth daily., Disp: , Rfl:    Multiple Vitamin (MULTIVITAMIN WITH MINERALS) TABS tablet, Take 1 tablet by mouth daily., Disp: , Rfl:    pantoprazole  (PROTONIX ) 40 MG tablet, Take 1 tablet (40 mg total) by mouth daily., Disp: 30 tablet, Rfl: 11   Polyethyl Glycol-Propyl Glycol (SYSTANE) 0.4-0.3 % SOLN, Place 1 drop into both eyes daily as needed (dry eyes)., Disp: , Rfl:   Current Facility-Administered Medications:    fluticasone  furoate-vilanterol (BREO ELLIPTA) 100-25 MCG/ACT 1 puff, 1 puff, Inhalation, Daily,       Objective:   Vitals:   02/26/24 0916 02/26/24 0917  BP:  (!) 122/58  Pulse: 71   SpO2: 98%   Weight:  196 lb 12.8 oz (89.3 kg)  Height:  5' 3 (1.6 m)    Estimated body mass index is 34.86 kg/m as calculated from the following:   Height as of this encounter: 5' 3 (1.6 m).   Weight as of this encounter: 196 lb 12.8 oz (89.3 kg).  @WEIGHTCHANGE @  [employer]   02/26/24 0917  Weight: 196 lb 12.8 oz (89.3 kg)  Physical Exam   General: No distress. Obese O2 at rest: no Cane present: no Sitting in wheel chair: no Frail: no Obese: YES Neuro: Alert and Oriented x 3. GCS 15. Speech normal Psych: Pleasant Resp:  Barrel Chest - no.  Wheeze - no, Crackles - no, No overt respiratory distress CVS: Normal heart sounds. Murmurs - no Ext: Stigmata of Connective Tissue Disease - no HEENT: Normal upper airway. PEERL +. No post nasal drip        Assessment:       ICD-10-CM   1. Chronic cough  R05.3 fluticasone  furoate-vilanterol (BREO ELLIPTA) 100-25 MCG/ACT 1 puff    AMB referral to pulmonary rehabilitation    2. Mild asthma without complication,  unspecified whether persistent  J45.909 fluticasone  furoate-vilanterol (BREO ELLIPTA) 100-25 MCG/ACT 1 puff    AMB referral to pulmonary rehabilitation    3. House dust mite allergy  Z91.09 fluticasone  furoate-vilanterol (BREO ELLIPTA) 100-25 MCG/ACT 1 puff    AMB referral to pulmonary rehabilitation    4. DOE (dyspnea on exertion)  R06.09 AMB referral to pulmonary rehabilitation    5. Grade I diastolic dysfunction  I51.89 AMB referral to pulmonary rehabilitation         Plan:     Patient Instructions     ICD-10-CM   1. Chronic cough  R05.3 fluticasone  furoate-vilanterol (BREO ELLIPTA) 100-25 MCG/ACT 1 puff    2. Mild asthma without complication, unspecified whether persistent  J45.909 fluticasone  furoate-vilanterol (BREO ELLIPTA) 100-25 MCG/ACT 1 puff    3. House dust mite allergy  Z91.09 fluticasone  furoate-vilanterol (BREO ELLIPTA) 100-25 MCG/ACT 1 puff       Mild asthma without complication, unspecified whether persistent -  Chronic cough - Pulmonary air trapping House dust mite allergy -  -MUCH improved with TRELEGY.  - Evidence suggest that you have allergic asthma with dust mite and cats contributing to this -  Although we do need to keep in mind carvedilol  and Zestril  might be contributing factors   PLAN -Chagne TRELEGY to BREO 100   - if expensive call us   -This might be the simplest of the options or other than stopping Coreg  and Zestril  which could have adverse implications for your heart. - Avoid getting new cat(s) - control dust mite at home (advised)    DOE (dyspnea on exertion) -obesity, physical deconditioning, diastolic dysfunction.  -This most likely related to weight, physical deconditioning and cardiac muscle stiffness and possibly mild asthma  Plan - Continue to monitor -Talk to primary care physician about weight loss drugs - Refer pulmonary rehabilitation  Hiatal hernia  Esophageal thickening -  Dysphagia, unspecified type -  P Pancreatic cyst -    -Radiology is concerned that there might be potential for esophageal cancer -> glad you saw Dr Elvin Hammer and endoscopy was reassuring  - Plan:  -  Per GI    Follow-up - Return to see nurse practitioner in 3 months - 6 months -   FOLLOWUP Return in about 5 months (around 07/28/2024) for with any of the APPS, Face to Face Visit.    SIGNATURE    Dr. Maire Scot, M.D., F.C.C.P,  Pulmonary and Critical Care Medicine Staff Physician, [facility] Center Director - Interstitial Lung Disease  Program  Pulmonary Fibrosis [facility] Network at [facility] [place], [name], [address]  Pager: [phone], If no answer or between  15:00h - 7:00h: call [phone] Telephone: [phone]  9:45 AM 02/26/2024

## 2024-03-01 ENCOUNTER — Telehealth (HOSPITAL_COMMUNITY): Payer: Self-pay

## 2024-03-01 ENCOUNTER — Other Ambulatory Visit: Payer: Self-pay | Admitting: Cardiology

## 2024-03-01 NOTE — Telephone Encounter (Signed)
 Pt insurance is active and benefits verified through [facility]. Co-pay $15, DED $0/$0 met, out of pocket $3,500/$593.22 met, co-insurance 0%. No pre-authorization required. 03/01/2024 @ 10:33am, spoke with Kenney Peacemaker, REF# [id number].

## 2024-03-01 NOTE — Telephone Encounter (Signed)
 Called patient to go over pulmonary rehab program. Patient is interested but says she's having issues with tingling/numbness in her legs and has an appt on 7/15 with her doctor for it, and would like to have us  call back for scheduling after that appt.

## 2024-03-04 ENCOUNTER — Telehealth (HOSPITAL_COMMUNITY): Payer: Self-pay

## 2024-03-04 NOTE — Telephone Encounter (Signed)
 Received referral from Dr. Geronimo for this pt to participate in Pulmonary Rehab with the diagnosis of Mild asthma without complication. Clinical review of pt follow up appt on 02/26/24 Pulmonary office note. Pt appropriate for scheduling for Pulmonary rehab. Will forward to support staff for scheduling and verification of insurance eligibility/benefits with pt consent.   Augustin KATHEE Sharps, RRT Cardiac and Pulmonary Rehab

## 2024-03-04 NOTE — Telephone Encounter (Signed)
 Made in error

## 2024-03-08 ENCOUNTER — Other Ambulatory Visit: Payer: Self-pay | Admitting: Physician Assistant

## 2024-03-08 ENCOUNTER — Other Ambulatory Visit: Payer: Self-pay | Admitting: Family Medicine

## 2024-03-08 DIAGNOSIS — M8589 Other specified disorders of bone density and structure, multiple sites: Secondary | ICD-10-CM

## 2024-03-08 DIAGNOSIS — I10 Essential (primary) hypertension: Secondary | ICD-10-CM

## 2024-03-08 NOTE — Telephone Encounter (Signed)
 Last Fill: 12/18/2023  Labs: 12/06/2023 RBC 3.72 Hemoglobin 11.8 HCT 35.0 Eosinophils Relative 5.2 Glucose 109  Next Visit: 07/03/2024  Last Visit: 01/02/2024  DX: Osteopenia of multiple sites   Current Dose per office note 01/02/2024: Fosamax  70 mg weekly   Okay to refill Fosamax ?

## 2024-03-12 ENCOUNTER — Encounter: Payer: Self-pay | Admitting: Family Medicine

## 2024-03-12 ENCOUNTER — Ambulatory Visit: Payer: Self-pay | Admitting: Family Medicine

## 2024-03-12 ENCOUNTER — Ambulatory Visit (INDEPENDENT_AMBULATORY_CARE_PROVIDER_SITE_OTHER): Admitting: Family Medicine

## 2024-03-12 VITALS — BP 116/76 | HR 66 | Temp 98.5°F | Ht 63.0 in | Wt 197.4 lb

## 2024-03-12 DIAGNOSIS — J301 Allergic rhinitis due to pollen: Secondary | ICD-10-CM | POA: Diagnosis not present

## 2024-03-12 DIAGNOSIS — R7303 Prediabetes: Secondary | ICD-10-CM | POA: Diagnosis not present

## 2024-03-12 DIAGNOSIS — E871 Hypo-osmolality and hyponatremia: Secondary | ICD-10-CM | POA: Diagnosis not present

## 2024-03-12 DIAGNOSIS — R202 Paresthesia of skin: Secondary | ICD-10-CM

## 2024-03-12 DIAGNOSIS — Z23 Encounter for immunization: Secondary | ICD-10-CM | POA: Diagnosis not present

## 2024-03-12 DIAGNOSIS — H6992 Unspecified Eustachian tube disorder, left ear: Secondary | ICD-10-CM | POA: Diagnosis not present

## 2024-03-12 LAB — BASIC METABOLIC PANEL WITH GFR
BUN: 21 mg/dL (ref 6–23)
CO2: 28 meq/L (ref 19–32)
Calcium: 9.3 mg/dL (ref 8.4–10.5)
Chloride: 98 meq/L (ref 96–112)
Creatinine, Ser: 0.87 mg/dL (ref 0.40–1.20)
GFR: 62.1 mL/min (ref >60.00)
Glucose, Bld: 104 mg/dL — ABNORMAL HIGH (ref 70–99)
Potassium: 4.2 meq/L (ref 3.5–5.1)
Sodium: 131 meq/L — ABNORMAL LOW (ref 135–145)

## 2024-03-12 LAB — HEMOGLOBIN A1C: Hgb A1c MFr Bld: 6.1 % (ref 4.6–6.5)

## 2024-03-12 NOTE — Progress Notes (Addendum)
 Established Patient Office Visit   Subjective:  Patient ID: Kerry  BRITTLEY [name], female    DOB: 1941/02/06  Age: 83 y.o. MRN: [id number]  Chief Complaint  Patient presents with   Medical Management of Chronic Issues    6 month follow up. Kerry [name]    HPI Encounter Diagnoses  Name Primary?   Prediabetes Yes   Paresthesias    Seasonal allergic rhinitis due to pollen    Dysfunction of left eustachian tube    Immunization due    Hyponatremia    For follow-up of above.  Ongoing paresthesias in her feet that are painful at times.  Follow-up is scheduled with neurology per her gynecologist.  Iron  levels were low normal and she continues with iron  sulfate every other day.  Continues with cyanocobalamin .  TSH levels have been normal recently.  Reports left ear congestion.  Ongoing allergy symptoms with sneezing watery rhinorrhea and postnasal drip.  She takes fexofenadine.  Reluctant to have the Shingrix vaccine.   Review of Systems  Constitutional: Negative.   HENT: Negative.    Eyes:  Negative for blurred vision, discharge and redness.  Respiratory: Negative.    Cardiovascular: Negative.   Gastrointestinal:  Negative for abdominal pain.  Genitourinary: Negative.   Musculoskeletal: Negative.  Negative for myalgias.  Skin:  Negative for rash.  Neurological:  Positive for tingling. Negative for loss of consciousness and weakness.  Endo/Heme/Allergies:  Negative for polydipsia.     Current Outpatient Medications:    acetaminophen  (TYLENOL ) 500 MG tablet, Take 500 mg by mouth every 6 (six) hours as needed for moderate pain or headache. , Disp: , Rfl:    alendronate  (FOSAMAX ) 70 MG tablet, TAKE 1 TABLET(70 MG) BY MOUTH 1 TIME A WEEK WITH A FULL GLASS OF WATER AND ON AN EMPTY STOMACH, Disp: 12 tablet, Rfl: 0   Ascorbic Acid (VITAMIN C PO), Take 1 tablet by mouth daily., Disp: , Rfl:    aspirin  81 MG chewable tablet, Chew 81 mg by mouth every other day., Disp: , Rfl:    atorvastatin  (LIPITOR) 20  MG tablet, TAKE 1 TABLET(20 MG) BY MOUTH DAILY, Disp: 90 tablet, Rfl: 2   carvedilol  (COREG ) 12.5 MG tablet, TAKE 1 TABLET(12.5 MG) BY MOUTH TWICE DAILY, Disp: 180 tablet, Rfl: 1   Cholecalciferol (VITAMIN D3) 2000 units capsule, Take 2,000 Units by mouth daily., Disp: , Rfl:    Coenzyme Q10-Vitamin E (QUNOL ULTRA COQ10 PO), Take 1 capsule by mouth daily., Disp: , Rfl:    Cyanocobalamin  500 MCG CHEW, Take one daily, Disp: 90 tablet, Rfl: 1   fexofenadine (ALLEGRA) 180 MG tablet, Take 180 mg by mouth every morning., Disp: , Rfl:    fluticasone  (FLONASE ) 50 MCG/ACT nasal spray, Place 2 sprays into both nostrils daily., Disp: 16 g, Rfl: 6   Iron , Ferrous Sulfate , 325 (65 Fe) MG TABS, Take 325 mg by mouth every other day., Disp: 45 tablet, Rfl: 3   lisinopril  (ZESTRIL ) 20 MG tablet, TAKE 1 TABLET(20 MG) BY MOUTH DAILY, Disp: 90 tablet, Rfl: 3   Misc Natural Products (NEURIVA PO), Take 1 tablet by mouth daily., Disp: , Rfl:    Multiple Vitamin (MULTIVITAMIN WITH MINERALS) TABS tablet, Take 1 tablet by mouth daily., Disp: , Rfl:    pantoprazole  (PROTONIX ) 40 MG tablet, Take 1 tablet (40 mg total) by mouth daily., Disp: 30 tablet, Rfl: 11   Polyethyl Glycol-Propyl Glycol (SYSTANE) 0.4-0.3 % SOLN, Place 1 drop into both eyes daily as needed (dry eyes)., Disp: ,  Rfl:   Current Facility-Administered Medications:    fluticasone  furoate-vilanterol (BREO ELLIPTA) 100-25 MCG/ACT 1 puff, 1 puff, Inhalation, Daily,    Objective:     BP 116/76 (Patient Position: Sitting, Cuff Size: Large)   Pulse 66   Temp 98.5 F (36.9 C) (Temporal)   Ht 5' 3 (1.6 m)   Wt 197 lb 6.4 oz (89.5 kg)   SpO2 94%   BMI 34.97 kg/m  BP Readings from Last 3 Encounters:  03/12/24 116/76  02/26/24 (!) 122/58  01/09/24 (!) 129/41   Wt Readings from Last 3 Encounters:  03/12/24 197 lb 6.4 oz (89.5 kg)  02/26/24 196 lb 12.8 oz (89.3 kg)  01/09/24 196 lb (88.9 kg)      Physical Exam Constitutional:      General: She is  not in acute distress.    Appearance: Normal appearance. She is not ill-appearing, toxic-appearing or diaphoretic.  HENT:     Head: Normocephalic and atraumatic.     Right Ear: Hearing, tympanic membrane, ear canal and external ear normal.     Left Ear: External ear normal.  No middle ear effusion. There is no impacted cerumen. Tympanic membrane is retracted.     Mouth/Throat:     Mouth: Mucous membranes are moist.     Pharynx: Oropharynx is clear. No oropharyngeal exudate or posterior oropharyngeal erythema.   Eyes:     General: No scleral icterus.       Right eye: No discharge.        Left eye: No discharge.     Extraocular Movements: Extraocular movements intact.     Conjunctiva/sclera: Conjunctivae normal.     Pupils: Pupils are equal, round, and reactive to light.    Cardiovascular:     Rate and Rhythm: Normal rate and regular rhythm.  Pulmonary:     Effort: Pulmonary effort is normal. No respiratory distress.     Breath sounds: Normal breath sounds. No wheezing or rales.  Abdominal:     General: Bowel sounds are normal.     Tenderness: There is no abdominal tenderness. There is no guarding.   Musculoskeletal:     Cervical back: No rigidity or tenderness.   Skin:    General: Skin is warm and dry.   Neurological:     Mental Status: She is alert and oriented to person, place, and time.   Psychiatric:        Mood and Affect: Mood normal.        Behavior: Behavior normal.    Diabetic Foot Exam - Simple   Simple Foot Form Diabetic Foot exam was performed with the following findings: Yes 03/12/2024  9:42 AM  Visual Inspection See comments: Yes Sensation Testing See comments: Yes Pulse Check Posterior Tibialis and Dorsalis pulse intact bilaterally: Yes Comments Feet with relaxed arches.  No lesions or ulcerations.  Toenails are thick and oncotic.  Sensory is intact to light touch.     Diabetic Foot Exam - Simple   Simple Foot Form Diabetic Foot exam was performed  with the following findings: Yes 03/12/2024  9:42 AM  Visual Inspection See comments: Yes Sensation Testing See comments: Yes Pulse Check Posterior Tibialis and Dorsalis pulse intact bilaterally: Yes Comments Feet with relaxed arches.  No lesions or ulcerations.  Toenails are thick and oncotic.  Sensory is intact to light touch.       Results for orders placed or performed in visit on 03/12/24  Basic metabolic panel with GFR  Result Value  Ref Range   Sodium 131 (L) 135 - 145 mEq/L   Potassium 4.2 3.5 - 5.1 mEq/L   Chloride 98 96 - 112 mEq/L   CO2 28 19 - 32 mEq/L   Glucose, Bld 104 (H) 70 - 99 mg/dL   BUN 21 6 - 23 mg/dL   Creatinine, Ser 9.12 0.40 - 1.20 mg/dL   GFR 37.89 >39.99 mL/min   Calcium  9.3 8.4 - 10.5 mg/dL  Hemoglobin J8r  Result Value Ref Range   Hgb A1c MFr Bld 6.1 4.6 - 6.5 %      The ASCVD Risk score (Arnett DK, et al., 2019) failed to calculate for the following reasons:   The 2019 ASCVD risk score is only valid for ages 109 to 17   Risk score cannot be calculated because patient has a medical history suggesting prior/existing ASCVD    Assessment & Plan:   Prediabetes -     Basic metabolic panel with GFR -     Hemoglobin A1c  Paresthesias  Seasonal allergic rhinitis due to pollen  Dysfunction of left eustachian tube  Immunization due  Hyponatremia    Return in about 6 weeks (around 04/23/2024).  Discussed the importance of preventing shingles and patient said that she will call have the vaccination.  Recommended that she add Flonase  which she has at home to help open up her congested ears.  Rechecking prediabetes.  Continue iron  and B12 therapy.  Will make an appointment with podiatry for toenail trim.  [name], MD

## 2024-03-12 NOTE — Addendum Note (Signed)
 Addended by: BERNETA ELSIE LABOR on: 03/12/2024 02:54 PM   Modules accepted: Orders

## 2024-03-13 DIAGNOSIS — D485 Neoplasm of uncertain behavior of skin: Secondary | ICD-10-CM | POA: Diagnosis not present

## 2024-03-13 DIAGNOSIS — L821 Other seborrheic keratosis: Secondary | ICD-10-CM | POA: Diagnosis not present

## 2024-03-13 DIAGNOSIS — L578 Other skin changes due to chronic exposure to nonionizing radiation: Secondary | ICD-10-CM | POA: Diagnosis not present

## 2024-03-13 DIAGNOSIS — D0439 Carcinoma in situ of skin of other parts of face: Secondary | ICD-10-CM | POA: Diagnosis not present

## 2024-03-13 DIAGNOSIS — L570 Actinic keratosis: Secondary | ICD-10-CM | POA: Diagnosis not present

## 2024-03-13 DIAGNOSIS — L565 Disseminated superficial actinic porokeratosis (DSAP): Secondary | ICD-10-CM | POA: Diagnosis not present

## 2024-03-13 DIAGNOSIS — D225 Melanocytic nevi of trunk: Secondary | ICD-10-CM | POA: Diagnosis not present

## 2024-03-18 NOTE — Telephone Encounter (Signed)
 Copied from CRM [id number]. Topic: Appointments - Scheduling Inquiry for Clinic >> Mar 18, 2024  9:02 AM Kerry [name] wrote: Reason for CRM: Patient was returning call to Nurse-please call

## 2024-03-22 ENCOUNTER — Telehealth (HOSPITAL_COMMUNITY): Payer: Self-pay

## 2024-03-22 NOTE — Telephone Encounter (Signed)
 Pt will call back to schedule pulmonary rehab after leg appt.   Closed referral.

## 2024-03-25 ENCOUNTER — Ambulatory Visit: Payer: Self-pay | Admitting: Cardiology

## 2024-03-25 ENCOUNTER — Ambulatory Visit (HOSPITAL_COMMUNITY)
Admission: RE | Admit: 2024-03-25 | Discharge: 2024-03-25 | Disposition: A | Discharge status: Home / Self Care | Payer: Medicare Other | Source: Ambulatory Visit | Attending: Internal Medicine | Admitting: Internal Medicine

## 2024-03-25 DIAGNOSIS — I351 Nonrheumatic aortic (valve) insufficiency: Secondary | ICD-10-CM | POA: Diagnosis not present

## 2024-03-25 DIAGNOSIS — I420 Dilated cardiomyopathy: Secondary | ICD-10-CM

## 2024-03-25 LAB — ECHOCARDIOGRAM COMPLETE
Area-P 1/2: 2.55 cm2
P 1/2 time: 371 ms
S' Lateral: 3.5 cm

## 2024-03-26 ENCOUNTER — Ambulatory Visit (INDEPENDENT_AMBULATORY_CARE_PROVIDER_SITE_OTHER): Admitting: Family Medicine

## 2024-03-26 ENCOUNTER — Ambulatory Visit: Admitting: Neurology

## 2024-03-26 ENCOUNTER — Encounter: Payer: Self-pay | Admitting: Neurology

## 2024-03-26 ENCOUNTER — Ambulatory Visit: Payer: Self-pay

## 2024-03-26 ENCOUNTER — Encounter: Payer: Self-pay | Admitting: Family Medicine

## 2024-03-26 ENCOUNTER — Telehealth: Payer: Self-pay | Admitting: Neurology

## 2024-03-26 VITALS — BP 150/59 | HR 75 | Ht 63.0 in | Wt 202.0 lb

## 2024-03-26 VITALS — BP 122/72 | HR 76 | Temp 99.1°F | Ht 62.0 in | Wt 201.8 lb

## 2024-03-26 DIAGNOSIS — G8929 Other chronic pain: Secondary | ICD-10-CM

## 2024-03-26 DIAGNOSIS — M79605 Pain in left leg: Secondary | ICD-10-CM | POA: Diagnosis not present

## 2024-03-26 DIAGNOSIS — M545 Low back pain, unspecified: Secondary | ICD-10-CM | POA: Diagnosis not present

## 2024-03-26 DIAGNOSIS — E669 Obesity, unspecified: Secondary | ICD-10-CM

## 2024-03-26 DIAGNOSIS — M7989 Other specified soft tissue disorders: Secondary | ICD-10-CM | POA: Diagnosis not present

## 2024-03-26 DIAGNOSIS — E871 Hypo-osmolality and hyponatremia: Secondary | ICD-10-CM

## 2024-03-26 DIAGNOSIS — R202 Paresthesia of skin: Secondary | ICD-10-CM

## 2024-03-26 DIAGNOSIS — M79604 Pain in right leg: Secondary | ICD-10-CM

## 2024-03-26 NOTE — Patient Instructions (Signed)
 We will do a lumbar spine MRI to look for degenerative changes in your lower back.  We will call you with the results.  We have to get insurance authorization for this test first.  We will do an EMG and nerve conduction velocity test, which is an electrical nerve and muscle test, which we will schedule. We will call you with the results.  We will check blood work today and call you with the test results.

## 2024-03-26 NOTE — Telephone Encounter (Signed)
 FYI Only or Action Required?: FYI only for provider.  Patient was last seen in primary care on 03/12/2024 by Berneta Elsie Sayre, MD.  Called Nurse Triage reporting Leg Swelling.  Symptoms began several days ago.  Interventions attempted: Nothing.  Symptoms are: gradually worsening.  Triage Disposition: No disposition on file.  Patient/caregiver understands and will follow disposition?:     Copied from CRM [id number]. Topic: Clinical - Red Word Triage >> Mar 26, 2024  1:29 PM Kerry [name] wrote: Reason for RMF:[id number] called stating both of her legs are swollen and heavy and it has been going on for five days Reason for Disposition  SEVERE leg swelling (e.g., swelling extends above knee, entire leg is swollen, weeping fluid)  Answer Assessment - Initial Assessment Questions 1. ONSET: When did the swelling start? (e.g., minutes, hours, days)     Four days ago 2. LOCATION: What part of the leg is swollen?  Are both legs swollen or just one leg?     Below the knee on both legs 3. SEVERITY: How bad is the swelling? (e.g., localized; mild, moderate, severe)     severe 4. REDNESS: Is there redness or signs of infection?     yes 5. PAIN: Is the swelling painful to touch? If Yes, ask: How painful is it?   (Scale 1-10; mild, moderate or severe)     no 6. FEVER: Do you have a fever? If Yes, ask: What is it, how was it measured, and when did it start?      no 7. CAUSE: What do you think is causing the leg swelling?     no 8. MEDICAL HISTORY: Do you have a history of blood clots (e.g., DVT), cancer, heart failure, kidney disease, or liver failure?    Leaky valve 9. RECURRENT SYMPTOM: Have you had leg swelling before? If Yes, ask: When was the last time? What happened that time?     no 10. OTHER SYMPTOMS: Do you have any other symptoms? (e.g., chest pain, difficulty breathing)       no 11. PREGNANCY: Is there any chance you are pregnant? When was your last  menstrual period?       na  Protocols used: Leg Swelling and Edema-A-AH

## 2024-03-26 NOTE — Progress Notes (Signed)
 Subjective:    Patient ID: Kerry [name] is a 83 y.o. female.  HPI    True Mar, MD, PhD [facility] [address], [address] [address] [place], [name] [address]  Dear Maya,   I saw your patient, Kerry [name], upon your kind request in my neurologic clinic today for initial consultation of her paresthesias.  The patient is unaccompanied today.  As you know, Kerry [name] is an 83 year old female with an underlying complex medical history of osteopenia arthritis, lumbar spondylosis, edema, TIA, hyperlipidemia, history of subdural hematoma, aortic valve insufficiency, vitamin D  deficiency, gait disorder, hypertension, dilated cardiomyopathy, anemia, history of syncope, pulmonary hypertension, OSA (no longer on PAP therapy), and obesity, who reports an approximately 1 year history of lower extremity pain and numbness and tingling, which starts in her feet and radiates upward.  She also has a history of low back pain but currently does not have radiating low back pain with the exception of gluteal pain.  She has not fallen within this year.  She uses a cane for gait safety.  She tries to hydrate well but recently stopped her hydrochlorothiazide  for lower sodium levels but then noticed a significant lower extremity swelling within the past 5 days.  She has not seen her PCP yet for this.  She reports no burning sensation and no complete numbness.  She had extensive blood work in the recent past, I reviewed blood test results in her electronic chart.  Vitamin B12 on 11/06/2023 was 637, A1c was 5.9, CBC showed decreased RBC and hematocrit below normal at 35.  CMP showed below normal sodium level at 134.  Iron  studies benign.    She had a brain MRI without contrast on 09/24/2021 and I have reviewed the results:   IMPRESSION: 1. Small right hemispheric subdural hematoma measuring 3 mm. No mass effect or midline shift. 2. Findings of chronic small vessel ischemia. 3. Right  scalp hematoma.   We had followed her in our sleep clinic in the past, she has not been seen in our clinic since December 2022.  She reports that she stopped using her CPAP about 2 years ago after she had a fall and hit her face.  She has a history of mild sleep apnea.  Previously:  08/31/2021 Young Bogaert, NP): <<TIA: Overall stable without new or reoccurring stroke/TIA symptoms.  Compliant on aspirin  and atorvastatin  -denies side effects.  Blood pressure today 125/63.  Lipid panel 03/2021 LDL 68   CPAP:  She was refitted with a different nasal mask with extra foam which she has been tolerating better.  Had COVID in November - had difficulty tolerating at that time due to frequent coughing and congestion. She does still have residual increased fatigue and activity intolerance.  She also has year-round allergies with increased mucous production at times which makes it difficult to use CPAP at times. Otherwise she has been tolerating well and re-started using routinely this week. Noted high leak rate but she does not notice this being any issue.  Epworth Sleepiness Scale 7.>>  03/04/2021 Young Bogaert, NP): <<Kerry [name] returns for 60-month TIA and CPAP follow-up.   TIA: Stable.  Denies new or reoccurring stroke/TIA symptoms.  Compliant on aspirin  and atorvastatin  without associated side effects.  Blood pressure today 138/75.    CPAP: She reports continued difficulty tolerating nasal nasal mask due to frequent leaks as well as seasonal allergies.  Order placed at prior visit for mask refitting but she reports she  was not contacted.  She will use a neck pillow to try to keep her head still as this will reduce mask leak.  She will also sometimes fall asleep in her recliner prior to placing mask on.  She has no other concerns today.  >>  08/17/2020 Young Bogaert, NP): <<Kerry [name] returns for 73-month TIA and CPAP follow-up.  Stable from stroke/TIA standpoint continuing on aspirin  and atorvastatin  without  side effects.  Blood pressure today 138/68. Monitors at home and has been stable.  Reports running out of carvedilol  and does not have cardiology follow-up until next week and has been taking metoprolol  which was leftover from a prior prescription.   Compliance report from 07/14/2020 -08/12/2020 shows 22 out of 30 usage days for 73% compliance and 19 days greater than 4 hours for 63% compliance.  Average usage 5 hours and 42 minutes.  Residual AHI 4.8.  Pressure setting min pressure 5 and max pressure 11 with pressure in the 95th percentile 10.8.  Leaks in the 95 percentile 45.7.   She has been experiencing difficulty tolerating due to frequent mask leaks and difficulty getting her nasal mask to correctly fit despite recently getting new supplies.  Also experiencing increase congestion and sinus pressure. ESS 7.  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 no further concerns at this time.  >>  02/26/2020 Young Bogaert, NP): <<Kerry [name] is being seen for initial CPAP compliance visit with prior history of TIA. Compliance report reviewed from 01/26/2020 -02/24/2020 showing 28 out of 30 usage days with 27 days greater than 4 hours for 90% compliance with average usage 6 hours and 59 minutes.  Residual AHI 2.1 with min pressure 5 cm H2O and max pressure of 11 cm H2O with EPR level 1.  Pressure in the 95th percentile 10.7.  Leaks in the 95th percentile 45.5 L/min. She does report mild improvement of daytime fatigue where she continues to work without difficulty and typically only becomes tired in the evening.  ESS 6 (reading, watching TV and laying down in afternoon).  She does report waking up with dry mouth in the morning as well as difficulty with allergies and increased congestion.  Reports occasional leaks when laying on her side but will subside after mask adjustment. She continues to follow with [facility] for needed supplies  and is currently up to date on supplies. Stable from TIA standpoint without new or reoccurring stroke/TIA symptoms.  Remains on aspirin  and atorvastatin  for secondary stroke prevention.  Blood pressure today 133/71.  Continues to follow routinely with PCP/cardiology for HTN and HLD management.  No stroke/TIA related concerns at this time.  >>  11/28/2019 Young Bogaert, NP): <<Kerry [name] is a 83 year old female who is being seen today, 11/28/2019, for TIA follow-up.  She has been doing well from a stroke standpoint reoccurring stroke/TIA symptoms.  She was referred to [facility] sleep clinic for suspected sleep apnea evaluated by Dr. Buck and underwent sleep study on 10/13/2019 which did show mild sleep apnea with AHI 11.9/h and recommended initiation of AutoPap.  Per review of phone conversation on 10/24/2019, patient wanted to further consider use of AutoPap and at this time, is interested in pursuing.  Continues on aspirin  and recently initiated atorvastatin  by cardiology for secondary stroke prevention without side effects.  Blood pressure today satisfactory at 124/58.  No further concerns at this time.  >>  09/23/2019 (SA): 83 year old right-handed woman  with an underlying medical history of migraine headaches, hypertension, hyperlipidemia, endometrial cancer, TIA, chronic asthma, allergies, LV dysfunction and DCH, and obesity, who reports mild shortness of breath and some sleep disruption.  She sleeps in a recliner and has done so for the past year, reports that her bed is typically taken over by her cats.  She now has 5 cats at the house.  She has a TV on at bedtime but it turns off on a timer.  She also likes to read while in the recliner.  She has a bedtime between 11 and 11:30 PM, rise time around 6.  She does not have to get up in the middle of the night to go to the bathroom.  She denies morning headaches or restless leg symptoms.  She does have a history of asthma and allergies, had a tonsillectomy at age 49.   She also had dentures made at age 40 for the top and then later for the bottom.  She has some mucus in the mornings.  She is not sure if she snores.  She has seen cardiology last month and was diagnosed with LV dysfunction, dilated cardiomyopathy and has another echocardiogram pending.  I reviewed your office note from 07/30/2019. Her Epworth sleepiness score is 3 out of 24, fatigue severity score is 14 out of 63.  She lives alone, she works at [employer], no PT, she is widowed x 20 years, she has 2 grown daughters, one locally and one in [name].  She is a non-smoker and does not utilize alcohol and drinks caffeine in the form of coffee, 1 cup/day on average and 2 cups of tea on average.   Her Past Medical History Is Significant For: Past Medical History:  Diagnosis Date   Allergy    Anemia    in her 20's   Aortic insufficiency    i   Arthritis    not dx'd- just per pt    Asthma    AS A CHILD   Cancer (HCC)    endometrial cancer with hysterectomy in 1987   Cancer (HCC)    melanoma on face    Cataract    removed both eyes    DCM (dilated cardiomyopathy) (HCC)    EF 50-55%   GERD (gastroesophageal reflux disease)    occ has with diet    History of syncope    HTN (hypertension)    Hyperlipidemia    Mitral regurgitation    mild by echo 03/2023   OSA on CPAP    Pulmonary HTN (HCC)    PASP on echo 03/2023   TIA (transient ischemic attack)    Tricuspid regurgitation    moderate by echo 03/2023    Her Past Surgical History Is Significant For: Past Surgical History:  Procedure Laterality Date   ABDOMINAL HYSTERECTOMY     ANKLE FRACTURE SURGERY Left    APPENDECTOMY     CATARACT EXTRACTION, BILATERAL     CHOLECYSTECTOMY     COLONOSCOPY  08/01/2007   Dr Abran    DENTAL SURGERY     MELANOMA EXCISION     face   ovary removal     right leg  fracture surgery      SKIN BIOPSY     pre-cancerous on face    Her Family History Is Significant For: Family History  Problem  Relation Age of Onset   Heart disease Mother    Kidney failure Mother    Diabetes Mother  Hyperlipidemia Mother    Hypertension Mother    Uterine cancer Mother    Dementia Father    Diabetes Sister    Multiple sclerosis Sister    Stomach cancer Sister    Dementia Sister    Throat cancer Brother        smoker   Diabetes Paternal Grandmother    Mitral valve prolapse Daughter    Colon cancer Neg Hx    Colon polyps Neg Hx    Esophageal cancer Neg Hx    Rectal cancer Neg Hx     Her Social History Is Significant For: Social History   Socioeconomic History   Marital status: Widowed    Spouse name: Not on file   Number of children: 2   Years of education: Not on file   Highest education level: Not on file  Occupational History   Occupation: retired  Tobacco Use   Smoking status: Never    Passive exposure: Past   Smokeless tobacco: Never  Vaping Use   Vaping status: Never Used  Substance and Sexual Activity   Alcohol use: No   Drug use: No   Sexual activity: Not Currently  Other Topics Concern   Not on file  Social History Narrative   Lives at home alone   1 cup of coffee per day    Works at [employer] Strain: Low Risk  (06/19/2023)   Overall Financial Resource Strain (CARDIA)    Difficulty of Paying Living Expenses: Not hard at all  Food Insecurity: No Food Insecurity (06/19/2023)   Hunger Vital Sign    Worried About Running Out of Food in the Last Year: Never true    Ran Out of Food in the Last Year: Never true  Transportation Needs: No Transportation Needs (06/19/2023)   PRAPARE - Administrator, Civil Service (Medical): No    Lack of Transportation (Non-Medical): No  Physical Activity: Insufficiently Active (06/19/2023)   Exercise Vital Sign    Days of Exercise per Week: 7 days    Minutes of Exercise per Session: 10 min  Stress: No Stress Concern Present (06/19/2023)   [employer] of  Occupational Health - Occupational Stress Questionnaire    Feeling of Stress : Not at all  Social Connections: Moderately Isolated (06/19/2023)   Social Connection and Isolation Panel    Frequency of Communication with Friends and Family: Twice a week    Frequency of Social Gatherings with Friends and Family: More than three times a week    Attends Religious Services: More than 4 times per year    Active Member of [employer] or Organizations: No    Attends Banker Meetings: Never    Marital Status: Widowed    Her Allergies Are:  Allergies  Allergen Reactions   Lovastatin Other (See Comments)    myalgias   Sudafed [Pseudoephedrine Hcl] Other (See Comments)    Hallucinations   :   Her Current Medications Are:  Outpatient Encounter Medications as of 03/26/2024  Medication Sig   acetaminophen  (TYLENOL ) 500 MG tablet Take 500 mg by mouth every 6 (six) hours as needed for moderate pain or headache.    alendronate  (FOSAMAX ) 70 MG tablet TAKE 1 TABLET(70 MG) BY MOUTH 1 TIME A WEEK WITH A FULL GLASS OF WATER AND ON AN EMPTY STOMACH   Ascorbic Acid (VITAMIN C PO) Take 1 tablet by mouth daily.   aspirin  81 MG  chewable tablet Chew 81 mg by mouth every other day.   atorvastatin  (LIPITOR) 20 MG tablet TAKE 1 TABLET(20 MG) BY MOUTH DAILY   carvedilol  (COREG ) 12.5 MG tablet TAKE 1 TABLET(12.5 MG) BY MOUTH TWICE DAILY   Cholecalciferol (VITAMIN D3) 2000 units capsule Take 2,000 Units by mouth daily.   Coenzyme Q10-Vitamin E (QUNOL ULTRA COQ10 PO) Take 1 capsule by mouth daily.   Cyanocobalamin  500 MCG CHEW Take one daily   fexofenadine (ALLEGRA) 180 MG tablet Take 180 mg by mouth every morning.   fluticasone  (FLONASE ) 50 MCG/ACT nasal spray Place 2 sprays into both nostrils daily.   Iron , Ferrous Sulfate , 325 (65 Fe) MG TABS Take 325 mg by mouth every other day.   lisinopril  (ZESTRIL ) 20 MG tablet TAKE 1 TABLET(20 MG) BY MOUTH DAILY   Misc Natural Products (NEURIVA PO) Take 1 tablet by  mouth daily.   Multiple Vitamin (MULTIVITAMIN WITH MINERALS) TABS tablet Take 1 tablet by mouth daily.   pantoprazole  (PROTONIX ) 40 MG tablet Take 1 tablet (40 mg total) by mouth daily.   Polyethyl Glycol-Propyl Glycol (SYSTANE) 0.4-0.3 % SOLN Place 1 drop into both eyes daily as needed (dry eyes).   TRELEGY ELLIPTA  100-62.5-25 MCG/ACT AEPB Inhale 1 puff into the lungs daily.   Facility-Administered Encounter Medications as of 03/26/2024  Medication   fluticasone  furoate-vilanterol (BREO ELLIPTA ) 100-25 MCG/ACT 1 puff  :   Review of Systems:  Out of a complete 14 point review of systems, all are reviewed and negative with the exception of these symptoms as listed below:   Review of Systems  Neurological:        Pt here for numbness, tingling in both feet,legs,bottom,back Pt states balance is off  Pt states bottom hurts when she sits down     ESS:4     Objective:  Neurological Exam  Physical Exam Physical Examination:   Vitals:   03/26/24 0949  BP: (!) 150/59  Pulse: 75    General Examination: The patient is a very pleasant 83 y.o. female in no acute distress. She appears well-developed and well-nourished and well groomed.   HEENT: Normocephalic, atraumatic, pupils are equal, round and reactive to light, extraocular tracking is good without limitation to gaze excursion or nystagmus noted. Corrective eyeglasses in place. Hearing is grossly intact. Face is symmetric with normal facial animation and normal facial sensation to light touch, temperature and vibration sense.  Forehead with significant rash and irritation, she reports using a cream for her precancerous forehead lesions and she has a small bandage on the left lower face, she reports that she will have skin cancer surgery on this spot soon. Speech is clear with no dysarthria noted. There is no hypophonia. There is no lip, neck/head, jaw or voice tremor. Neck is supple with full range of passive and active motion. There  are no carotid bruits on auscultation. Oropharynx exam reveals: Moderate to significant mouth dryness, adequate dental hygiene with full dentures in place.    Chest: Clear to auscultation without wheezing, rhonchi or crackles noted.   Heart: S1+S2+0, regular and normal without murmurs, rubs or gallops noted.    Abdomen: Soft, non-tender and non-distended with normal bowel sounds appreciated on auscultation.   Extremities: There is 3+ pitting edema in the distal lower extremities bilaterally, L more than R, particularly worse around the ankles bilaterally.     Skin: Warm and dry without trophic changes noted.    Musculoskeletal: exam reveals deformity of left ankle with status post fracture  in the past, and status post surgery with unremarkable surgical scar, also fracture of the right ankle with status post surgery and surgical scars.     Neurologically:  Mental status: The patient is awake, alert and oriented in all 4 spheres. Her immediate and remote memory, attention, language skills and fund of knowledge are appropriate. There is no evidence of aphasia, agnosia, apraxia or anomia. Speech is clear with normal prosody and enunciation. Thought process is linear. Mood is normal and affect is normal.  Cranial nerves II - XII are as described above under HEENT exam.  Motor exam: Normal bulk, strength and tone is noted. There is no postural or action or resting tremor.  Romberg is not tested d/t safety concerns. Fine motor skills and coordination: grossly intact.   Cerebellar testing: No dysmetria or intention tremor. There is no truncal or gait ataxia.   Sensory exam: intact to light touch, temperature and vibration sense in the upper extremities but decreased vibration sense in the lower extremities up to mid or upper shin areas.  She does have significant swelling which may impair her ability to feel vibration.    Gait, station and balance: She stands with mild difficulty and walks with a  single-point cane, she has a slight limp.  She walks slowly and cautiously, no shuffling.     Assessment and Plan:  In summary, Kerry [name] is an 83 year old female with an underlying complex medical history of osteopenia arthritis, lumbar spondylosis, edema, TIA, hyperlipidemia, history of subdural hematoma, aortic valve insufficiency, vitamin D  deficiency, gait disorder, hypertension, dilated cardiomyopathy, anemia, history of syncope, pulmonary hypertension, OSA (no longer on PAP therapy), and obesity, who presents for evaluation of her lower extremity pain and paresthesias of approximately 1 years duration, differential diagnosis includes neuropathy, radiculopathy, pain from swelling, pain from arthritic changes and prior surgeries and injuries.  She is at risk for neuropathy secondary to prediabetes and obesity.  We will do additional blood work today and I recommended that we proceed with EMG and nerve conduction velocity testing through our office.  In addition, I would like to proceed with a lumbar spine MRI to look for degenerative changes and evidence of radiculopathy.  I explained these tests to her in detail and she is agreeable to proceeding.  We will keep her posted as to her test results for now by phone call.  She is advised to follow-up as soon as possible with her PCP regarding her significant swelling which she has noted about 5 days ago, she stopped using her hydrochlorothiazide  about a week ago because of low sodium levels.  We will plan to follow-up in this clinic according to her test results and for now I did not recommend any new medications from my end of things.  I answered all her questions today and she was in agreement with our approach.  Thank you very much for allowing me to participate in the care of this nice patient. If I can be of any further assistance to you please do not hesitate to call me at [phone].  Sincerely,   True Mar, MD, PhD  This was an  extended visit of over 60 minutes with copious record review involved in considerable counseling and coordination of care.

## 2024-03-26 NOTE — Telephone Encounter (Signed)
 no auth required sent to GI [phone]

## 2024-03-26 NOTE — Progress Notes (Signed)
 Established Patient Office Visit   Subjective:  Patient ID: Kerry [name]  Kerry [name], female    DOB: 02/28/1941  Age: 83 y.o. MRN: [id number]  Chief Complaint  Patient presents with   Leg Swelling    Noticed it 4 days ago swelling in both legs lower extremity feeling hard, heavy tight.    HPI Encounter Diagnoses  Name Primary?   Swelling of lower extremity Yes   3 to 4-day history of swelling in her lower extremities.  There has been no shortness of breath chest pain or difficulty breathing.  She sleeps in a recliner in the evening because of back pain.  Recent echocardiogram revealed an ejection fraction of 45 to 50% which was essentially unchanged from last years.  Noted mitral and aortic valve regurgitation.  Liver enzymes and kidney function have been normal.  History of venous incompetence in her lower extremities.  She has worn compression stockings in the past but the pressures prescribed and painful for her and she stopped using.   Review of Systems  Constitutional: Negative.   HENT: Negative.    Eyes:  Negative for blurred vision, discharge and redness.  Respiratory: Negative.  Negative for shortness of breath.   Cardiovascular: Negative.  Negative for chest pain and palpitations.  Gastrointestinal:  Negative for abdominal pain.  Genitourinary: Negative.   Musculoskeletal: Negative.  Negative for myalgias.  Skin:  Negative for rash.  Neurological:  Negative for tingling, loss of consciousness and weakness.  Endo/Heme/Allergies:  Negative for polydipsia.     Current Outpatient Medications:    acetaminophen  (TYLENOL ) 500 MG tablet, Take 500 mg by mouth every 6 (six) hours as needed for moderate pain or headache. , Disp: , Rfl:    alendronate  (FOSAMAX ) 70 MG tablet, TAKE 1 TABLET(70 MG) BY MOUTH 1 TIME A WEEK WITH A FULL GLASS OF WATER AND ON AN EMPTY STOMACH, Disp: 12 tablet, Rfl: 0   Ascorbic Acid (VITAMIN C PO), Take 1 tablet by mouth daily., Disp: , Rfl:    aspirin  81 MG  chewable tablet, Chew 81 mg by mouth every other day., Disp: , Rfl:    atorvastatin  (LIPITOR) 20 MG tablet, TAKE 1 TABLET(20 MG) BY MOUTH DAILY, Disp: 90 tablet, Rfl: 2   carvedilol  (COREG ) 12.5 MG tablet, TAKE 1 TABLET(12.5 MG) BY MOUTH TWICE DAILY, Disp: 180 tablet, Rfl: 1   Cholecalciferol (VITAMIN D3) 2000 units capsule, Take 2,000 Units by mouth daily., Disp: , Rfl:    Coenzyme Q10-Vitamin E (QUNOL ULTRA COQ10 PO), Take 1 capsule by mouth daily., Disp: , Rfl:    Cyanocobalamin  500 MCG CHEW, Take one daily, Disp: 90 tablet, Rfl: 1   fexofenadine (ALLEGRA) 180 MG tablet, Take 180 mg by mouth every morning., Disp: , Rfl:    fluticasone  (FLONASE ) 50 MCG/ACT nasal spray, Place 2 sprays into both nostrils daily., Disp: 16 g, Rfl: 6   Iron , Ferrous Sulfate , 325 (65 Fe) MG TABS, Take 325 mg by mouth every other day., Disp: 45 tablet, Rfl: 3   lisinopril  (ZESTRIL ) 20 MG tablet, TAKE 1 TABLET(20 MG) BY MOUTH DAILY, Disp: 90 tablet, Rfl: 3   Misc Natural Products (NEURIVA PO), Take 1 tablet by mouth daily., Disp: , Rfl:    Multiple Vitamin (MULTIVITAMIN WITH MINERALS) TABS tablet, Take 1 tablet by mouth daily., Disp: , Rfl:    pantoprazole  (PROTONIX ) 40 MG tablet, Take 1 tablet (40 mg total) by mouth daily., Disp: 30 tablet, Rfl: 11   Polyethyl Glycol-Propyl Glycol (SYSTANE) 0.4-0.3 % SOLN,  Place 1 drop into both eyes daily as needed (dry eyes)., Disp: , Rfl:    TRELEGY ELLIPTA  100-62.5-25 MCG/ACT AEPB, Inhale 1 puff into the lungs daily., Disp: , Rfl:   Current Facility-Administered Medications:    fluticasone  furoate-vilanterol (BREO ELLIPTA ) 100-25 MCG/ACT 1 puff, 1 puff, Inhalation, Daily,    Objective:     BP 122/72 (BP Location: Left Arm, Patient Position: Sitting, Cuff Size: Normal)   Pulse 76   Temp 99.1 F (37.3 C) (Temporal)   Ht 5' 2 (1.575 m)   Wt 201 lb 12.8 oz (91.5 kg)   SpO2 92%   BMI 36.91 kg/m    Physical Exam Constitutional:      General: She is not in acute  distress.    Appearance: Normal appearance. She is not ill-appearing, toxic-appearing or diaphoretic.  HENT:     Head: Normocephalic and atraumatic.     Right Ear: External ear normal.     Left Ear: External ear normal.     Mouth/Throat:     Mouth: Mucous membranes are moist.     Pharynx: Oropharynx is clear. No oropharyngeal exudate or posterior oropharyngeal erythema.  Eyes:     General: No scleral icterus.       Right eye: No discharge.        Left eye: No discharge.     Extraocular Movements: Extraocular movements intact.     Conjunctiva/sclera: Conjunctivae normal.     Pupils: Pupils are equal, round, and reactive to light.  Cardiovascular:     Rate and Rhythm: Normal rate and regular rhythm.  Pulmonary:     Effort: Pulmonary effort is normal. No respiratory distress.     Breath sounds: Normal breath sounds. No wheezing, rhonchi or rales.  Abdominal:     General: Bowel sounds are normal.  Musculoskeletal:     Cervical back: No rigidity or tenderness.     Right lower leg: Edema (Swelling trace edema) present.     Left lower leg: Edema (Swelling trace edema.) present.  Skin:    General: Skin is warm and dry.  Neurological:     Mental Status: She is alert and oriented to person, place, and time.  Psychiatric:        Mood and Affect: Mood normal.        Behavior: Behavior normal.      No results found for any visits on 03/26/24.    The ASCVD Risk score (Arnett DK, et al., 2019) failed to calculate for the following reasons:   The 2019 ASCVD risk score is only valid for ages 20 to 34   Risk score cannot be calculated because patient has a medical history suggesting prior/existing ASCVD    Assessment & Plan:   Swelling of lower extremity    Return Use compression stockings and be mindful of sodium intake., for Return if not improving in a few weeks.[name] Meigs a brief course of diuretics but patient would like to avoid if possible.  She will begin to use the lower  pressure compression stockings that were more comfortable for her and easier to apply.  Will be mindful of sodium intake.  Kerry Sim Lent, MD

## 2024-03-26 NOTE — Telephone Encounter (Signed)
-----   Message from Wilbert Bihari sent at 03/25/2024 10:54 AM EDT ----- Echo showed mild LV dysfunction EF 45 to 50% with increase stiffness of the heart muscle normal for age.  There is mild leakiness of the mitral valve.  There is mild leakiness of the aortic valve.   Compared to echo 03/31/2023 heart function is essentially unchanged and no change in leakiness of aortic valve or mitral valve.  Repeat echo in 1 year ----- Message ----- From: Interface, [employer] One [employer] Sent: 03/25/2024  10:12 AM EDT To: Wilbert JONELLE Bihari, MD

## 2024-03-26 NOTE — Telephone Encounter (Signed)
 Call to patient to discuss echo results. No answer, left detailed message per DPR explaining  Echo showed mild LV dysfunction EF 45 to 50% with increase stiffness of the heart muscle normal for age.  There is mild leakiness of the mitral valve.  There is mild leakiness of the aortic valve.    Compared to echo 03/31/2023 heart function is essentially unchanged and no change in leakiness of aortic valve or mitral valve.  Repeat echo in 1 year  Advised patient to call our office if any questions. Echo order placed.

## 2024-03-28 ENCOUNTER — Ambulatory Visit
Admission: RE | Admit: 2024-03-28 | Discharge: 2024-03-28 | Disposition: A | Discharge status: Home / Self Care | Source: Ambulatory Visit | Attending: Neurology | Admitting: Neurology

## 2024-03-28 DIAGNOSIS — R202 Paresthesia of skin: Secondary | ICD-10-CM | POA: Diagnosis not present

## 2024-03-28 DIAGNOSIS — E871 Hypo-osmolality and hyponatremia: Secondary | ICD-10-CM | POA: Diagnosis not present

## 2024-03-28 DIAGNOSIS — M7989 Other specified soft tissue disorders: Secondary | ICD-10-CM | POA: Diagnosis not present

## 2024-03-28 DIAGNOSIS — M545 Low back pain, unspecified: Secondary | ICD-10-CM | POA: Diagnosis not present

## 2024-03-28 DIAGNOSIS — M47816 Spondylosis without myelopathy or radiculopathy, lumbar region: Secondary | ICD-10-CM | POA: Diagnosis not present

## 2024-03-28 DIAGNOSIS — M48061 Spinal stenosis, lumbar region without neurogenic claudication: Secondary | ICD-10-CM | POA: Diagnosis not present

## 2024-03-28 DIAGNOSIS — M79604 Pain in right leg: Secondary | ICD-10-CM | POA: Diagnosis not present

## 2024-03-28 DIAGNOSIS — M79605 Pain in left leg: Secondary | ICD-10-CM | POA: Diagnosis not present

## 2024-03-29 LAB — MULTIPLE MYELOMA PANEL, SERUM
Albumin SerPl Elph-Mcnc: 3.5 g/dL (ref 2.9–4.4)
Albumin/Glob SerPl: 1.1 (ref 0.7–1.7)
Alpha 1: 0.3 g/dL (ref 0.0–0.4)
Alpha2 Glob SerPl Elph-Mcnc: 0.8 g/dL (ref 0.4–1.0)
B-Globulin SerPl Elph-Mcnc: 1.1 g/dL (ref 0.7–1.3)
Gamma Glob SerPl Elph-Mcnc: 1.2 g/dL (ref 0.4–1.8)
Globulin, Total: 3.4 g/dL (ref 2.2–3.9)
IgA/Immunoglobulin A, Serum: 463 mg/dL — ABNORMAL HIGH (ref 64–422)
IgG (Immunoglobin G), Serum: 1174 mg/dL (ref 586–1602)
IgM (Immunoglobulin M), Srm: 82 mg/dL (ref 26–217)
Total Protein: 6.9 g/dL (ref 6.0–8.5)

## 2024-03-29 LAB — TSH: TSH: 2.33 u[IU]/mL (ref 0.450–4.500)

## 2024-03-29 LAB — VITAMIN D 25 HYDROXY (VIT D DEFICIENCY, FRACTURES): Vit D, 25-Hydroxy: 34.8 ng/mL (ref 30.0–100.0)

## 2024-03-29 LAB — CK: Total CK: 95 U/L (ref 26–161)

## 2024-04-01 ENCOUNTER — Ambulatory Visit: Payer: Self-pay | Admitting: Neurology

## 2024-04-01 DIAGNOSIS — M47816 Spondylosis without myelopathy or radiculopathy, lumbar region: Secondary | ICD-10-CM

## 2024-04-03 ENCOUNTER — Telehealth: Payer: Self-pay | Admitting: Internal Medicine

## 2024-04-03 NOTE — Telephone Encounter (Signed)
 Copied from CRM [id number]. Topic: Clinical - Medication Refill >> Apr 03, 2024  8:35 AM Celestine FALCON wrote: Medication: fluticasone  furoate-vilanterol (BREO ELLIPTA ) 100-25 MCG/ACT 1 puff   Has the patient contacted their pharmacy? Yes; per pt's last visit her medication change should've been implemented. The notes suggest fluticasone  furoate-vilanterol (BREO ELLIPTA ) 100-25 MCG/ACT 1 puff instead of trelegy ellipta .  (Agent: If no, request that the patient contact the pharmacy for the refill. If patient does not wish to contact the pharmacy document the reason why and proceed with request.) (Agent: If yes, when and what did the pharmacy advise?)  This is the patient's preferred pharmacy:  [id number] - [place], [place] - [address] AT [address] [place] [address] Phone: [phone] Fax: [phone]  Is this the correct pharmacy for this prescription? Yes If no, delete pharmacy and type the correct one.   Has the prescription been filled recently? No  Is the patient out of the medication? Yes  Has the patient been seen for an appointment in the last year OR does the patient have an upcoming appointment? Yes  Can we respond through MyChart? No; prefer a phone call.  Agent: Please be advised that Rx refills may take up to 3 business days. We ask that you follow-up with your pharmacy.

## 2024-04-04 ENCOUNTER — Telehealth: Payer: Self-pay | Admitting: Neurology

## 2024-04-04 NOTE — Telephone Encounter (Signed)
 Called pt gave her results of MRI LS.  She would like a referral.  Has not see orthopedics.  I told her will call her when placed and to whom.  She appreciated that.

## 2024-04-04 NOTE — Telephone Encounter (Signed)
-----   Message from True Mar sent at 04/01/2024 12:18 PM EDT ----- Please advise patient that her recent lumbar spine MRI shows degenerative arthritis at multiple levels in her lower back.  If she has not seen a spine specialist for her low back pain and lower  extremity pain and numbness, I recommend a referral to orthopedics.  If she would like, we can facilitate with a referral. ----- Message ----- From: Rosemarie Eather RAMAN, MD Sent: 03/28/2024   1:03 PM EDT To: True Mar, MD

## 2024-04-04 NOTE — Telephone Encounter (Signed)
 Signed     Referral for Orthoepedics faxed to [facility])   Maralee Verlie) Phone:[phone] Fax:[phone]

## 2024-04-05 MED ORDER — FLUTICASONE FUROATE-VILANTEROL 100-25 MCG/ACT IN AEPB: 1.0000 | INHALATION_SPRAY | Freq: Every day | RESPIRATORY_TRACT | 3 refills | Status: DC

## 2024-04-05 NOTE — Telephone Encounter (Signed)
 Per love note Chagne TRELEGY to [facility]  I have sent rx to pharmacy

## 2024-04-08 NOTE — Telephone Encounter (Signed)
 I called pt and LMVM for her that following up on her appt for [facility].  I see from system she has appt 04-29-2024 with megan williams with [facility].  She is to call back if questions.

## 2024-04-10 DIAGNOSIS — C44329 Squamous cell carcinoma of skin of other parts of face: Secondary | ICD-10-CM | POA: Diagnosis not present

## 2024-04-18 ENCOUNTER — Other Ambulatory Visit: Payer: Self-pay | Admitting: Family Medicine

## 2024-04-18 DIAGNOSIS — H6992 Unspecified Eustachian tube disorder, left ear: Secondary | ICD-10-CM

## 2024-04-18 NOTE — Telephone Encounter (Signed)
 Spoke to patient gave labwok results Gave pt Dr Amelia recommendation. Pt states has appointment with PCP this month forward lab work  to PCP Pt expressed understanding and thanked me for calling

## 2024-04-23 ENCOUNTER — Encounter: Payer: Self-pay | Admitting: Family Medicine

## 2024-04-23 ENCOUNTER — Ambulatory Visit (INDEPENDENT_AMBULATORY_CARE_PROVIDER_SITE_OTHER): Admitting: Family Medicine

## 2024-04-23 VITALS — BP 134/62 | HR 70 | Temp 96.5°F | Ht 62.0 in | Wt 196.6 lb

## 2024-04-23 DIAGNOSIS — D649 Anemia, unspecified: Secondary | ICD-10-CM

## 2024-04-23 DIAGNOSIS — E611 Iron deficiency: Secondary | ICD-10-CM

## 2024-04-23 DIAGNOSIS — M7989 Other specified soft tissue disorders: Secondary | ICD-10-CM

## 2024-04-23 NOTE — Progress Notes (Addendum)
 Established Patient Office Visit   Subjective:  Patient ID: Kerry  JAYLE [name], female    DOB: 02-01-1941  Age: 83 y.o. MRN: [id number]  Chief Complaint  Patient presents with   Medical Management of Chronic Issues    6 week follow up. Pt is not fasting. 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     HPI Encounter Diagnoses  Name Primary?   Swelling of lower extremity Yes   Iron  deficiency    Normocytic anemia    Swelling in lower extremities is improved with gentle compression.  Continues iron  therapy every other day.  Vitamin D  levels at 34.  B12 levels at 696.  Continues 500 mcg of vitamin B-12 daily along with 2000 IUs of vitamin D  daily.   Review of Systems  Constitutional: Negative.   HENT: Negative.    Eyes:  Negative for blurred vision, discharge and redness.  Respiratory: Negative.    Cardiovascular: Negative.   Gastrointestinal:  Negative for abdominal pain.  Genitourinary: Negative.   Musculoskeletal: Negative.  Negative for myalgias.  Skin:  Negative for rash.  Neurological:  Negative for tingling, loss of consciousness and weakness.  Endo/Heme/Allergies:  Negative for polydipsia.     Current Outpatient Medications:    acetaminophen  (TYLENOL ) 500 MG tablet, Take 500 mg by mouth every 6 (six) hours as needed for moderate pain or headache. , Disp: , Rfl:    alendronate  (FOSAMAX ) 70 MG tablet, TAKE 1 TABLET(70 MG) BY MOUTH 1 TIME A WEEK WITH A FULL GLASS OF WATER AND ON AN EMPTY STOMACH, Disp: 12 tablet, Rfl: 0   Ascorbic Acid (VITAMIN C PO), Take 1 tablet by mouth daily., Disp: , Rfl:    aspirin  81 MG chewable tablet, Chew 81 mg by mouth every other day., Disp: , Rfl:    atorvastatin  (LIPITOR) 20 MG tablet, TAKE 1 TABLET(20 MG) BY MOUTH DAILY, Disp: 90 tablet, Rfl: 2   carvedilol  (COREG ) 12.5 MG tablet, TAKE 1 TABLET(12.5 MG) BY MOUTH TWICE DAILY, Disp: 180 tablet, Rfl: 1   Cholecalciferol (VITAMIN D3) 2000 units capsule, Take 2,000 Units by mouth  daily., Disp: , Rfl:    Coenzyme Q10-Vitamin E (QUNOL ULTRA COQ10 PO), Take 1 capsule by mouth daily., Disp: , Rfl:    Cyanocobalamin  500 MCG CHEW, Take one daily, Disp: 90 tablet, Rfl: 1   fexofenadine (ALLEGRA) 180 MG tablet, Take 180 mg by mouth every morning., Disp: , Rfl:    fluticasone  (FLONASE ) 50 MCG/ACT nasal spray, SHAKE LIQUID AND USE 2 SPRAYS IN EACH NOSTRIL DAILY, Disp: 48 g, Rfl: 1   fluticasone  furoate-vilanterol (BREO ELLIPTA ) 100-25 MCG/ACT AEPB, Inhale 1 puff into the lungs daily., Disp: 28 each, Rfl: 3   lisinopril  (ZESTRIL ) 20 MG tablet, TAKE 1 TABLET(20 MG) BY MOUTH DAILY, Disp: 90 tablet, Rfl: 3   Misc Natural Products (NEURIVA PO), Take 1 tablet by mouth daily., Disp: , Rfl:    Multiple Vitamin (MULTIVITAMIN WITH MINERALS) TABS tablet, Take 1 tablet by mouth daily., Disp: , Rfl:    pantoprazole  (PROTONIX ) 40 MG tablet, Take 1 tablet (40 mg total) by mouth daily., Disp: 30 tablet, Rfl: 11   Polyethyl Glycol-Propyl Glycol (SYSTANE) 0.4-0.3 % SOLN, Place 1 drop into both eyes daily as needed (dry eyes)., Disp: , Rfl:    Iron , Ferrous Sulfate , 325 (65 Fe) MG TABS, Take 325 mg by mouth daily., Disp: 90 tablet, Rfl: 3  Current Facility-Administered Medications:    fluticasone  furoate-vilanterol (BREO ELLIPTA ) 100-25 MCG/ACT  1 puff, 1 puff, Inhalation, Daily,    Objective:     BP 134/62 (BP Location: Right Arm, Patient Position: Sitting, Cuff Size: Large)   Pulse 70   Temp (!) 96.5 F (35.8 C) (Temporal)   Ht 5' 2 (1.575 m)   Wt 196 lb 9.6 oz (89.2 kg)   SpO2 95%   BMI 35.96 kg/m  BP Readings from Last 3 Encounters:  04/23/24 134/62  03/26/24 122/72  03/26/24 (!) 150/59   Wt Readings from Last 3 Encounters:  04/23/24 196 lb 9.6 oz (89.2 kg)  03/26/24 201 lb 12.8 oz (91.5 kg)  03/26/24 202 lb (91.6 kg)      Physical Exam Constitutional:      General: She is not in acute distress.    Appearance: Normal appearance. She is not ill-appearing, toxic-appearing or  diaphoretic.  HENT:     Head: Normocephalic and atraumatic.     Right Ear: External ear normal.     Left Ear: External ear normal.  Eyes:     General: No scleral icterus.       Right eye: No discharge.        Left eye: No discharge.     Extraocular Movements: Extraocular movements intact.     Conjunctiva/sclera: Conjunctivae normal.  Pulmonary:     Effort: Pulmonary effort is normal. No respiratory distress.  Musculoskeletal:       Legs:  Skin:    General: Skin is warm and dry.  Neurological:     Mental Status: She is alert and oriented to person, place, and time.  Psychiatric:        Mood and Affect: Mood normal.        Behavior: Behavior normal.      Results for orders placed or performed in visit on 04/23/24  Iron , TIBC and Ferritin Panel  Result Value Ref Range   Iron  98 45 - 160 mcg/dL   TIBC 690 749 - 549 mcg/dL (calc)   %SAT 32 16 - 45 % (calc)   Ferritin 30 16 - 288 ng/mL      The ASCVD Risk score (Arnett DK, et al., 2019) failed to calculate for the following reasons:   The 2019 ASCVD risk score is only valid for ages 8 to 77   Risk score cannot be calculated because patient has a medical history suggesting prior/existing ASCVD    Assessment & Plan:   Swelling of lower extremity  Iron  deficiency -     Iron , TIBC and Ferritin Panel -     Iron  (Ferrous Sulfate ); Take 325 mg by mouth daily.  Dispense: 90 tablet; Refill: 3  Normocytic anemia -     Iron  (Ferrous Sulfate ); Take 325 mg by mouth daily.  Dispense: 90 tablet; Refill: 3    Return in about 3 months (around 07/24/2024), or Continue current supplements as directed.  May need to increase iron  tablets..  Information was given on an iron  rich diet.    Kerry Sim Lent, MD  8/14 addendum: Iron  levels are in the low normal range.  Will ask the patient to take the iron  tablet daily as tolerated.

## 2024-04-24 LAB — IRON,TIBC AND FERRITIN PANEL
%SAT: 32 % (ref 16–45)
Ferritin: 30 ng/mL (ref 16–288)
Iron: 98 ug/dL (ref 45–160)
TIBC: 309 ug/dL (ref 250–450)

## 2024-04-25 ENCOUNTER — Ambulatory Visit: Payer: Self-pay | Admitting: Family Medicine

## 2024-04-25 MED ORDER — IRON (FERROUS SULFATE) 325 (65 FE) MG PO TABS: 325.0000 mg | ORAL_TABLET | Freq: Every day | ORAL | 3 refills | Status: AC

## 2024-04-25 NOTE — Addendum Note (Signed)
 Addended by: BERNETA ELSIE LABOR on: 04/25/2024 08:04 AM   Modules accepted: Orders

## 2024-04-29 ENCOUNTER — Ambulatory Visit: Admitting: Physical Medicine and Rehabilitation

## 2024-04-29 ENCOUNTER — Encounter: Payer: Self-pay | Admitting: Physical Medicine and Rehabilitation

## 2024-04-29 DIAGNOSIS — M48062 Spinal stenosis, lumbar region with neurogenic claudication: Secondary | ICD-10-CM

## 2024-04-29 DIAGNOSIS — M5416 Radiculopathy, lumbar region: Secondary | ICD-10-CM

## 2024-04-29 DIAGNOSIS — M5442 Lumbago with sciatica, left side: Secondary | ICD-10-CM | POA: Diagnosis not present

## 2024-04-29 DIAGNOSIS — G8929 Other chronic pain: Secondary | ICD-10-CM | POA: Diagnosis not present

## 2024-04-29 DIAGNOSIS — M5441 Lumbago with sciatica, right side: Secondary | ICD-10-CM

## 2024-04-29 DIAGNOSIS — R269 Unspecified abnormalities of gait and mobility: Secondary | ICD-10-CM

## 2024-04-29 NOTE — Progress Notes (Signed)
 Pain Scale   Average Pain 6 Patient advised she had chronic lower back pain radiating to bilateral hip areas        +Driver, -BT, -Dye Allergies.

## 2024-04-29 NOTE — Progress Notes (Signed)
 Kerry [name]  Kerry [name] - 83 y.o. female MRN [id number]  Date of birth: [birth year]  Office Visit Note: Visit Date: 04/29/2024 PCP: Berneta Elsie Sayre, MD Referred by: Buck Saucer, MD  Subjective: Chief Complaint  Patient presents with   Lower Back - Pain   HPI: Kerry  F [name] is a 83 y.o. female who comes in today per the request of Dr. Saucer Buck for evaluation of chronic, worsening and severe pain to bilateral feet radiating up her legs. Also reports numbness/tingling to legs and pain to bilateral lower back/buttocks. Symptoms ongoing for about 1 year, worsens with prolonged standing and walking. Sitting seems to alleviate her pain. She describes pain as tingling, sharp and aching sensation, currently rates as 8 out of 10. Some relief of pain with home exercise regimen, rest and use of medications. History of formal physical therapy in 2024 with some relief of pain. Recent lumbar MRI imaging shows spondylolisthesis and severe spinal canal stenosis at L4-L5. No history of lumbar surgery/injections. She is scheduled to undergo bilateral lower extremity nerve study with Dr. Onita Duos with [facility] on 05/22/2024. She is currently working at [employer] for 2 days a week. Patient denies focal weakness. No recent trauma or falls. She is currently using cane to assist with ambulation.      Review of Systems  Cardiovascular:  Positive for leg swelling.  Musculoskeletal:  Positive for back pain.  Neurological:  Positive for tingling. Negative for focal weakness and weakness.  All other systems reviewed and are negative.  Otherwise per HPI.  Assessment & Plan: Visit Diagnoses:    ICD-10-CM   1. Chronic bilateral low back pain with bilateral sciatica  M54.42 Ambulatory referral to Physical Medicine Rehab   M54.41    G89.29     2. Lumbar radiculopathy  M54.16 Ambulatory referral to Physical Medicine Rehab    3. Spinal stenosis of lumbar region with neurogenic claudication  M48.062  Ambulatory referral to Physical Medicine Rehab    4. Gait abnormality  R26.9 Ambulatory referral to Physical Medicine Rehab       Plan: Findings:  Chronic, worsening and severe pain to bilateral feet radiating up her legs. Paresthesias to legs and pain to bilateral lower back/buttocks. Patient continues to have severe pain despite good conservative therapies such as formal physical therapy, home exercise regimen, rest and use of medications. Patients clinical presentation and exam are consistent with neurogenic claudication as a result of spinal canal stenosis.  I discussed recent lumbar MRI with patient today using imaging and spine model.  There is multifactorial severe spinal canal stenosis at the level of L4-L5.  We discussed treatment plan in detail today.  Next step is to perform diagnostic and hopefully therapeutic bilateral L4 transforaminal epidural steroid injection under fluoroscopic guidance. I discussed injection procedure with patient in detail today, Dr. Eldonna also at bedside to answer any questions. No red flag symptoms noted upon exam today. Patient to follow up with Dr. Duos in the coming weeks for bilateral lower extremity nerve study.     Meds & Orders: No orders of the defined types were placed in this encounter.   Orders Placed This Encounter  Procedures   Ambulatory referral to Physical Medicine Rehab    Follow-up: Return for Bilateral L4 transforaminal epidural steroid injection.   Procedures: No procedures performed      Clinical History: [facility] [address] [place], [name] [address] [phone]   NEUROIMAGING REPORT     STUDY DATE:  03/28/2024 PATIENT NAME: Kerry  F [name] DOB: March 13, 1941 MRN: [id number]   ORDERING CLINICIAN: Dr Buck CLINICAL HISTORY: 83 year old patient being evaluated for paresthesias in the feet COMPARISON FILMS: X-ray lumbar spine 08/19/2022 EXAM: MRI lumbar spine without contrast TECHNIQUE:  Sagittal T1, T2, STIR and axial T1 and T2 images were obtained through lumbar spine CONTRAST: None IMAGING SITE: [facility]   FINDINGS:  The lumbar vertebra demonstrate exaggerated forward lordotic curvature and spondylolisthesis of L5 or L4 vertebrae in mild disc and facet degenerative changes throughout. T12-L1 shows mild loss of disc height and facet hypertrophy but no significant compression. L1-L2 also shows minor disc degenerative change and prominent facet hypertrophy left more than right resulting in mild posterior canal narrowing but no definite compression. L2-3 shows prominent facet hypertrophy and ligamentum flavum hypertrophy resulting in moderate posterior canal and moderate left-sided foraminal and right mild sided foraminal narrowing. L3-4 also shows mild disc degenerative change and prominent facet hypertrophy and ligamentum flavum hypertrophy with moderate posterior canal narrowing moderate bilateral foraminal narrowing. L4-5 there is mild disc degenerative change and posterior subluxation of L5 over L4 vertebrae along with severe facet hypertrophy and ligamentum flavum hypertrophy resulting in severe posterior canal moderate bilateral foraminal narrowing.   L5-S1 shows prominent disc and endplate degenerative changes along with facet hypertrophy resulting in mild posterior canal and mild left-sided foraminal narrowing. The paraspinal soft tissue appear unremarkable.  Visualized portion of the lower thoracic vertebrae also show mild disc degenerative changes.  The conus medullaris terminates at L1.       IMPRESSION:   MRI scan of the lumbar spine shows prominent spondylolistheis at L4-L5 with multifactorial severe posterior canal and moderate bilateral foraminal narrowing.  L3-4 also shows moderate posterior canal narrowing and moderate bilateral foraminal narrowing.       INTERPRETING PHYSICIAN:  EATHER POPP, MD Certified in  Neuroimaging by American Society of  Neuroimaging and [employer]   She reports that she has never smoked. She has been exposed to tobacco smoke. She has never used smokeless tobacco.  Recent Labs    06/15/23 0942 11/06/23 1058 03/12/24 0942  HGBA1C 5.9 5.9 6.1    Objective:  VS:  HT:    WT:   BMI:     BP:   HR: bpm  TEMP: ( )  RESP:  Physical Exam Vitals and nursing note reviewed.  HENT:     Head: Normocephalic and atraumatic.     Right Ear: External ear normal.     Left Ear: External ear normal.     Nose: Nose normal.     Mouth/Throat:     Mouth: Mucous membranes are moist.  Eyes:     Extraocular Movements: Extraocular movements intact.  Cardiovascular:     Pulses: Normal pulses.  Pulmonary:     Effort: Pulmonary effort is normal.  Abdominal:     General: Abdomen is flat. There is no distension.  Musculoskeletal:        General: Tenderness present.     Cervical back: Normal range of motion.     Right lower leg: Edema present.     Left lower leg: Edema present.     Comments: Patient is slow to rise from seated position to standing. Good lumbar range of motion. No pain noted with facet loading. 5/5 strength noted with bilateral hip flexion, knee flexion/extension, ankle dorsiflexion/plantarflexion and EHL. No clonus noted bilaterally. No pain upon palpation of greater trochanters. No pain with internal/external rotation of bilateral hips.  Sensation intact bilaterally. Negative slump test bilaterally. Ambulates without with cane, gait slow and unsteady.     Skin:    General: Skin is warm and dry.     Capillary Refill: Capillary refill takes less than 2 seconds.  Neurological:     Mental Status: She is alert and oriented to person, place, and time.     Gait: Gait abnormal.  Psychiatric:        Mood and Affect: Mood normal.        Behavior: Behavior normal.     Ortho Exam  Imaging: No results found.  Past Medical/Family/Surgical/Social History: Medications &  Allergies reviewed per EMR, new medications updated. Patient Active Problem List   Diagnosis Date Noted   Dysfunction of left eustachian tube 09/25/2023   Abnormal PFT 06/15/2023   Tricuspid regurgitation    Mitral regurgitation    Pulmonary HTN (HCC)    Prediabetes 03/15/2023   History of dehydration 03/15/2023   Osteopenia 09/14/2022   Low back pain with sciatica 08/19/2022   Gait instability 08/19/2022   B12 deficiency 03/14/2022   Hospital discharge follow-up 09/28/2021   Traumatic ecchymosis of head 09/28/2021   Chronic post-traumatic headache, not intractable 09/28/2021   Subdural hematoma (HCC) 09/24/2021   Encephalopathy acute 09/24/2021   Normocytic anemia 09/14/2021   Seasonal allergic rhinitis due to pollen 03/11/2021   DCM (dilated cardiomyopathy) (HCC)    Hyponatremia 06/21/2019   TIA (transient ischemic attack) 06/20/2019   Aortic insufficiency 08/17/2018   Hyperlipidemia    Right thyroid  nodule 02/07/2013   Right carotid bruit 07/16/2012   Ocular migraine 11/04/2011   Hypercholesterolemia 06/01/2011   Essential hypertension    History of syncope    Asthma    Past Medical History:  Diagnosis Date   Allergy    Anemia    in her 20's   Aortic insufficiency    i   Arthritis    not dx'd- just per pt    Asthma    AS A CHILD   Cancer (HCC)    endometrial cancer with hysterectomy in 1987   Cancer (HCC)    melanoma on face    Cataract    removed both eyes    DCM (dilated cardiomyopathy) (HCC)    EF 50-55%   GERD (gastroesophageal reflux disease)    occ has with diet    History of syncope    HTN (hypertension)    Hyperlipidemia    Mitral regurgitation    mild by echo 03/2023   OSA on CPAP    Pulmonary HTN (HCC)    PASP on echo 03/2023   TIA (transient ischemic attack)    Tricuspid regurgitation    moderate by echo 03/2023   Family History  Problem Relation Age of Onset   Heart disease Mother    Kidney failure Mother    Diabetes Mother     Hyperlipidemia Mother    Hypertension Mother    Uterine cancer Mother    Dementia Father    Diabetes Sister    Multiple sclerosis Sister    Stomach cancer Sister    Dementia Sister    Throat cancer Brother        smoker   Diabetes Paternal Grandmother    Mitral valve prolapse Daughter    Colon cancer Neg Hx    Colon polyps Neg Hx    Esophageal cancer Neg Hx    Rectal cancer Neg Hx    Past Surgical History:  Procedure Laterality  Date   ABDOMINAL HYSTERECTOMY     ANKLE FRACTURE SURGERY Left    APPENDECTOMY     CATARACT EXTRACTION, BILATERAL     CHOLECYSTECTOMY     COLONOSCOPY  08/01/2007   Dr Abran    DENTAL SURGERY     MELANOMA EXCISION     face   ovary removal     right leg  fracture surgery      SKIN BIOPSY     pre-cancerous on face   Social History   Occupational History   Occupation: retired  Tobacco Use   Smoking status: Never    Passive exposure: Past   Smokeless tobacco: Never  Vaping Use   Vaping status: Never Used  Substance and Sexual Activity   Alcohol use: No   Drug use: No   Sexual activity: Not Currently

## 2024-04-29 NOTE — Progress Notes (Signed)
 Core Outcome Measures Index (COMI) Back Score  Average Pain 8  COMI Score 80 %

## 2024-05-06 ENCOUNTER — Ambulatory Visit: Payer: Medicare Other | Admitting: Family Medicine

## 2024-05-22 ENCOUNTER — Ambulatory Visit: Admitting: Neurology

## 2024-05-22 DIAGNOSIS — M79605 Pain in left leg: Secondary | ICD-10-CM

## 2024-05-22 DIAGNOSIS — G8929 Other chronic pain: Secondary | ICD-10-CM

## 2024-05-22 DIAGNOSIS — M545 Low back pain, unspecified: Secondary | ICD-10-CM | POA: Diagnosis not present

## 2024-05-22 DIAGNOSIS — E871 Hypo-osmolality and hyponatremia: Secondary | ICD-10-CM | POA: Diagnosis not present

## 2024-05-22 DIAGNOSIS — M7989 Other specified soft tissue disorders: Secondary | ICD-10-CM

## 2024-05-22 DIAGNOSIS — M79604 Pain in right leg: Secondary | ICD-10-CM

## 2024-05-22 DIAGNOSIS — R202 Paresthesia of skin: Secondary | ICD-10-CM | POA: Diagnosis not present

## 2024-05-22 NOTE — Procedures (Signed)
 Full Name: Kerry  [name] Gender: Female MRN #: [id number] Date of Birth: 10/13/1940    Visit Date: 05/22/2024 10:54 Age: 83 Years Examining Physician: Onita Duos Referring Physician: Buck Saucer Height: 5 feet 4 inch History, 83 year old female history of slow worsening low back pain, lower extremity paresthesia, neck pain, gait abnormality  Summary of the test: Nerve conduction study: Nerve conduction is limited due to lower extremity edema, absent bilateral lower extremity motor and sensory response, with exception of right tibial motor response showed moderately decreased CMAP amplitude  Right ulnar and radial sensory responses were normal.  Right ulnar motor responses were within normal limit  Electromyography: Selected needle examination was performed at bilateral lower extremity muscles, lumbosacral paraspinal muscles; right upper extremity muscles and right cervical paraspinal muscles.  There is evidence of significant chronic neuropathic changes involving bilateral lumbosacral myotomes, L4-5 more than S1.  There were increased insertional activity at bilateral lower lumbar paraspinal muscles, no spontaneous activity.  There was also evidence of mild chronic neuropathic changes involving right C5-6 myotomes.  There was no spontaneous activity at right cervical paraspinal muscles.   Conclusion: This is an abnormal yet technically difficult study due to significant lower extremity pitting edema.  There was evidence of chronic bilateral lumbosacral radiculopathy involving bilateral L4-5 more than S1 myotomes.  There was also suggestion of mild right cervical radiculopathy involving right C5-6 myotomes, there was no evidence of active process.  It was difficult to evaluate the severity of her peripheral neuropathy due to lower extremity pitting edema, however in light of moderately preserved right tibial motor response, I would think her peripheral neuropathy is in the  moderate degree.    ------------------------------- Duos Onita. M.D. Ph.D.   [facility] [address] [place], [name] [address] Tel: [phone] Fax: [phone]  Verbal informed consent was obtained from the patient, patient was informed of potential risk of procedure, including bruising, bleeding, hematoma formation, infection, muscle weakness, muscle pain, numbness, among others.        MNC    Nerve / Sites Muscle Latency Ref. Amplitude Ref. Rel Amp Segments Distance Velocity Ref. Area    ms ms mV mV %  cm m/s m/s mVms  R Ulnar - ADM     Wrist ADM 2.8 <=3.3 8.3 >=6.0 100 Wrist - ADM 7   20.0     B.Elbow ADM 5.0  7.6  92.1 B.Elbow - Wrist 15 71 >=49 19.1     A.Elbow ADM 6.9  7.8  103 A.Elbow - B.Elbow 13 67 >=49 21.3  R Peroneal - EDB     Ankle EDB NR <=6.5 NR >=2.0 NR Ankle - EDB 9   NR         Pop fossa - Ankle      R Tibial - AH     Ankle AH 5.1 <=5.8 1.6 >=4.0 100 Ankle - AH 9   2.7     Pop fossa AH 16.0  1.1  65.5 Pop fossa - Ankle 45 41 >=41 1.1  L Tibial - AH     Ankle AH NR <=5.8 NR >=4.0 NR Ankle - AH 9   NR                 SNC    Nerve / Sites Rec. Site Peak Lat Ref.  Amp Ref. Segments Distance    ms ms V V  cm  R Radial - Anatomical snuff box (Forearm)  Forearm Wrist 2.2 <=2.9 28 >=15 Forearm - Wrist 10  L Sural - Ankle (Calf)     Calf Ankle NR <=4.4 NR >=6 Calf - Ankle 14  R Sural - Ankle (Calf)     Calf Ankle NR <=4.4 NR >=6 Calf - Ankle 14  L Superficial peroneal - Ankle     Lat leg Ankle NR <=4.4 NR >=6 Lat leg - Ankle 14  R Superficial peroneal - Ankle     Lat leg Ankle NR <=4.4 NR >=6 Lat leg - Ankle 14  R Ulnar - Orthodromic, (Dig V, Mid palm)     Dig V Wrist 2.5 <=3.1 6 >=5 Dig V - Wrist 42                 F  Wave    Nerve F Lat Ref.   ms ms  R Tibial - AH 61.2 <=56.0  R Ulnar - ADM 25.7 <=32.0         EMG Summary Table    Spontaneous MUAP Recruitment  Muscle IA Fib PSW Fasc Other Amp Dur. Poly Pattern   R. First dorsal interosseous Normal None None None _______ Normal Normal Normal Normal  R. Extensor digitorum communis Normal None None None _______ Normal Normal Normal Normal  R. Biceps brachii Normal None None None _______ Normal Normal Normal Reduced  R. Deltoid Normal None None None _______ Normal Normal Normal Reduced  R. Triceps brachii Normal None None None _______ Normal Normal Normal Reduced  R. Cervical paraspinals Normal None None None _______ Normal Normal Normal Normal  R. Tibialis anterior Normal None None None _______ Increased Increased 1+ Reduced  R. Tibialis posterior Normal None None None _______ Increased Increased 1+ Reduced  R. Peroneus longus Normal None None None _______ Increased Increased 1+ Reduced  R. Vastus lateralis Normal None None None _______ Increased Increased 1+ Reduced  L. Tibialis anterior Normal None None None _______ Increased Increased 1+ Reduced  L. Tibialis posterior Normal None None None _______ Increased Increased 1+ Reduced  L. Peroneus longus Normal None None None _______ Increased Increased 1+ Reduced  L. Gastrocnemius (Medial head) Normal None None None _______ Increased Increased 1+ Reduced  L. Vastus lateralis Increased None None None _______ Increased Increased 1+ Reduced  R. Lumbar paraspinals (low) Increased None None None _______ Normal Normal Normal Normal  R. Lumbar paraspinals (mid) Increased None None None _______ Normal Normal Normal Normal  L. Lumbar paraspinals (mid) Increased None None None _______ Normal Normal Normal Normal  L. Lumbar paraspinals (low) Increased None None None _______ Normal Normal Normal Normal

## 2024-05-22 NOTE — Progress Notes (Signed)
 Chief Complaint  Patient presents with   NCS    Rm 3 alone Pt is well    ASSESSMENT AND PLAN  Kerry [name] is a 83 y.o. female   Slow Worsening gait normality, urinary incontinence, Chronic neck pain, MRI of lumbar spine showed severe degenerative changes L 4-5, posterior subluxation of L5 over L4, with severe canal stenosis, bilateral foraminal narrowing, EMG nerve conduction study demonstrated mild to moderate length-dependent peripheral neuropathy, chronic bilateral lumbosacral radiculopathy, may have a component of mild chronic right cervical radiculopathy She has hyperreflexia of both upper extremity patella possible Babinski signs on examination, complains of neck pain, cracking sound moving her neck, worsening urinary incontinence, MRI of cervical spine to rule out cervical spondylitic myelopathy Her gait abnormality are multifactorial, including lower extremity swelling, ankle pain from previous fracture, ankle surgery, also limited range of motion of ankles, mild toe weakness from chronic lumbar radiculopathy, mild to moderate peripheral neuropathy contributed to, Physical therapy   DIAGNOSTIC DATA (LABS, IMAGING, TESTING) - I reviewed patient records, labs, notes, testing and imaging myself where available.   MEDICAL HISTORY:  Kerry  F [name], is a 82 year old female, seen in request by Dr. Buck for evaluation of worsening gait abnormality, paresthesia  History is obtained from the patient and review of electronic medical records. I personally reviewed pertinent available imaging films in PACS.   PMHx of  Hyperlipidemia Hypertension Endometrial cancer with total hysterectomy 1987 Obstructive sleep apnea on CPAP Bilateral ankle surgery  She drove herself to clinic today, lives alone, still work part-time job at [employer], she used to be very active, painting her house, mowing her yard until 2023, she noticed increased low back pain, gait abnormality, limited  mobility  He complains deep achy pain at bilateral calf, now more towards her lower back, pending epidural injection by orthopedic clinic  She had a history of left ankle fracture, required surgery, at the limited range of motion, also had a right distal leg fracture, required surgery many years ago, chronic bilateral ankle pain, ankle swelling, now has progressively worse  In addition, over the past couple years she noticed slow worsening urinary urgency, frequency, occasional incontinence,  She also has chronic neck pain, cracking sound when moving her neck, right shoulder pain, limited range of motion,   PHYSICAL EXAM:   Vitals:   05/22/24 1030  BP: (!) 141/71  Pulse: 87  Weight: 202 lb (91.6 kg)  Height: 5' 3 (1.6 m)   Not recorded     Body mass index is 35.78 kg/m.  PHYSICAL EXAMNIATION:  Gen: NAD, conversant, well nourised, well groomed                     Cardiovascular: Regular rate rhythm, no peripheral edema, warm, nontender. Eyes: Conjunctivae clear without exudates or hemorrhage Neck: Supple, no carotid bruits. Pulmonary: Clear to auscultation bilaterally   NEUROLOGICAL EXAM:  MENTAL STATUS: Speech/cognition: Awake, alert, oriented to history taking and casual conversation CRANIAL NERVES: CN II: Visual fields are full to confrontation. Pupils are round equal and briskly reactive to light. CN III, IV, VI: extraocular movement are normal. No ptosis. CN V: Facial sensation is intact to light touch CN VII: Face is symmetric with normal eye closure  CN VIII: Hearing is normal to causal conversation. CN IX, X: Phonation is normal. CN XI: Head turning and shoulder shrug are intact  MOTOR: Limited range of motion of right shoulder, no significant upper extremity muscle weakness, Significant swelling of bilateral  ankle, moderate right toe flexion extension weakness, Limited range of motion of left ankle, mild left toe extension flexion  weakness  REFLEXES: Reflexes are 2+ and symmetric at the biceps, triceps, knees, and absent at ankles. Plantar responses are extensor bilaterally  SENSORY: Preserved to left toe vibratory sensation, toe proprioception, length-dependent decreased light touch pinprick to mid shin level  COORDINATION: There is no trunk or limb dysmetria noted.  GAIT/STANCE: Push-up, wide-based, cautious unsteady  REVIEW OF SYSTEMS:  Full 14 system review of systems performed and notable only for as above All other review of systems were negative.   ALLERGIES: Allergies  Allergen Reactions   Lovastatin Other (See Comments)    myalgias   Sudafed [Pseudoephedrine Hcl] Other (See Comments)    Hallucinations     HOME MEDICATIONS: Current Outpatient Medications  Medication Sig Dispense Refill   acetaminophen  (TYLENOL ) 500 MG tablet Take 500 mg by mouth every 6 (six) hours as needed for moderate pain or headache.      alendronate  (FOSAMAX ) 70 MG tablet TAKE 1 TABLET(70 MG) BY MOUTH 1 TIME A WEEK WITH A FULL GLASS OF WATER AND ON AN EMPTY STOMACH 12 tablet 0   Ascorbic Acid (VITAMIN C PO) Take 1 tablet by mouth daily.     aspirin  81 MG chewable tablet Chew 81 mg by mouth every other day.     atorvastatin  (LIPITOR) 20 MG tablet TAKE 1 TABLET(20 MG) BY MOUTH DAILY 90 tablet 2   carvedilol  (COREG ) 12.5 MG tablet TAKE 1 TABLET(12.5 MG) BY MOUTH TWICE DAILY 180 tablet 1   Cholecalciferol (VITAMIN D3) 2000 units capsule Take 2,000 Units by mouth daily.     Coenzyme Q10-Vitamin E (QUNOL ULTRA COQ10 PO) Take 1 capsule by mouth daily.     Cyanocobalamin  500 MCG CHEW Take one daily 90 tablet 1   fexofenadine (ALLEGRA) 180 MG tablet Take 180 mg by mouth every morning.     fluticasone  (FLONASE ) 50 MCG/ACT nasal spray SHAKE LIQUID AND USE 2 SPRAYS IN EACH NOSTRIL DAILY 48 g 1   fluticasone  furoate-vilanterol (BREO ELLIPTA ) 100-25 MCG/ACT AEPB Inhale 1 puff into the lungs daily. 28 each 3   Iron , Ferrous Sulfate ,  325 (65 Fe) MG TABS Take 325 mg by mouth daily. 90 tablet 3   lisinopril  (ZESTRIL ) 20 MG tablet TAKE 1 TABLET(20 MG) BY MOUTH DAILY 90 tablet 3   Misc Natural Products (NEURIVA PO) Take 1 tablet by mouth daily.     Multiple Vitamin (MULTIVITAMIN WITH MINERALS) TABS tablet Take 1 tablet by mouth daily.     pantoprazole  (PROTONIX ) 40 MG tablet Take 1 tablet (40 mg total) by mouth daily. 30 tablet 11   Polyethyl Glycol-Propyl Glycol (SYSTANE) 0.4-0.3 % SOLN Place 1 drop into both eyes daily as needed (dry eyes).     Current Facility-Administered Medications  Medication Dose Route Frequency Provider Last Rate Last Admin   fluticasone  furoate-vilanterol (BREO ELLIPTA ) 100-25 MCG/ACT 1 puff  1 puff Inhalation Daily         PAST MEDICAL HISTORY: Past Medical History:  Diagnosis Date   Allergy    Anemia    in her 20's   Aortic insufficiency    i   Arthritis    not dx'd- just per pt    Asthma    AS A CHILD   Cancer (HCC)    endometrial cancer with hysterectomy in 1987   Cancer (HCC)    melanoma on face    Cataract    removed  both eyes    DCM (dilated cardiomyopathy) (HCC)    EF 50-55%   GERD (gastroesophageal reflux disease)    occ has with diet    History of syncope    HTN (hypertension)    Hyperlipidemia    Mitral regurgitation    mild by echo 03/2023   OSA on CPAP    Pulmonary HTN (HCC)    PASP on echo 03/2023   TIA (transient ischemic attack)    Tricuspid regurgitation    moderate by echo 03/2023    PAST SURGICAL HISTORY: Past Surgical History:  Procedure Laterality Date   ABDOMINAL HYSTERECTOMY     ANKLE FRACTURE SURGERY Left    APPENDECTOMY     CATARACT EXTRACTION, BILATERAL     CHOLECYSTECTOMY     COLONOSCOPY  08/01/2007   Dr Abran    DENTAL SURGERY     MELANOMA EXCISION     face   ovary removal     right leg  fracture surgery      SKIN BIOPSY     pre-cancerous on face    FAMILY HISTORY: Family History  Problem Relation Age of Onset   Heart  disease Mother    Kidney failure Mother    Diabetes Mother    Hyperlipidemia Mother    Hypertension Mother    Uterine cancer Mother    Dementia Father    Diabetes Sister    Multiple sclerosis Sister    Stomach cancer Sister    Dementia Sister    Throat cancer Brother        smoker   Diabetes Paternal Grandmother    Mitral valve prolapse Daughter    Colon cancer Neg Hx    Colon polyps Neg Hx    Esophageal cancer Neg Hx    Rectal cancer Neg Hx     SOCIAL HISTORY: Social History   Socioeconomic History   Marital status: Widowed    Spouse name: Not on file   Number of children: 2   Years of education: Not on file   Highest education level: Not on file  Occupational History   Occupation: retired  Tobacco Use   Smoking status: Never    Passive exposure: Past   Smokeless tobacco: Never  Vaping Use   Vaping status: Never Used  Substance and Sexual Activity   Alcohol use: No   Drug use: No   Sexual activity: Not Currently  Other Topics Concern   Not on file  Social History Narrative   Lives at home alone   1 cup of coffee per day    Works at [employer] Strain: Low Risk  (06/19/2023)   Overall Financial Resource Strain (CARDIA)    Difficulty of Paying Living Expenses: Not hard at all  Food Insecurity: No Food Insecurity (06/19/2023)   Hunger Vital Sign    Worried About Running Out of Food in the Last Year: Never true    Ran Out of Food in the Last Year: Never true  Transportation Needs: No Transportation Needs (06/19/2023)   PRAPARE - Administrator, Civil Service (Medical): No    Lack of Transportation (Non-Medical): No  Physical Activity: Insufficiently Active (06/19/2023)   Exercise Vital Sign    Days of Exercise per Week: 7 days    Minutes of Exercise per Session: 10 min  Stress: No Stress Concern Present (06/19/2023)   [employer] of Occupational Health - Occupational  Stress Questionnaire     Feeling of Stress : Not at all  Social Connections: Moderately Isolated (06/19/2023)   Social Connection and Isolation Panel    Frequency of Communication with Friends and Family: Twice a week    Frequency of Social Gatherings with Friends and Family: More than three times a week    Attends Religious Services: More than 4 times per year    Active Member of [employer] or Organizations: No    Attends Banker Meetings: Never    Marital Status: Widowed  Intimate Partner Violence: Not At Risk (06/19/2023)   Humiliation, Afraid, Rape, and Kick questionnaire    Fear of Current or Ex-Partner: No    Emotionally Abused: No    Physically Abused: No    Sexually Abused: No      Modena Callander, M.D. Ph.D.  [facility] [address], [address] [place], [name] [address] Ph: [phone] Fax: [phone]  CC:  Berneta Elsie Sayre, MD [address] [place],  [name] [address]  Berneta Elsie Sayre, MD

## 2024-05-27 ENCOUNTER — Other Ambulatory Visit: Payer: Self-pay

## 2024-05-27 ENCOUNTER — Ambulatory Visit: Admitting: Physical Medicine and Rehabilitation

## 2024-05-27 VITALS — BP 183/82 | HR 83

## 2024-05-27 DIAGNOSIS — M5416 Radiculopathy, lumbar region: Secondary | ICD-10-CM | POA: Diagnosis not present

## 2024-05-27 MED ORDER — METHYLPREDNISOLONE ACETATE 40 MG/ML IJ SUSP
40.0000 mg | Freq: Once | INTRAMUSCULAR | Status: AC
  Administered 2024-05-27: 40 mg

## 2024-05-27 NOTE — Progress Notes (Unsigned)
 Pain Scale   Average Pain 4 Patient advising she has chronic lower back pain radiating to right leg pain increasing when walking and decreasing when resting.         +Driver, -BT, -Dye Allergies.

## 2024-05-27 NOTE — Telephone Encounter (Signed)
-----   Message from True Mar sent at 05/22/2024  5:46 PM EDT ----- Please advise patient that her recent EMG and nerve conduction velocity test through our office was interpreted by Dr. Onita and shows evidence of degenerative spine disease in the lower back with  pinched nerve type findings resulting in probably some of her symptoms.  There is an element of nerve damage or what we call neuropathy but it was difficult to discern how severe it is in light of a  technically difficult study due to lower extremity swelling.  So far, we have not found a cause for neuropathy but I would like to stress that weight loss is recommended and obesity can be a risk  factor for neuropathy as well.  I would recommend that she get evaluated for degenerative spine disease by orthopedics/spine doctor.  We have already initiated a referral to orthopedics.  In fact,  she is scheduled next week with orthopedics. ----- Message ----- From: Onita Duos, MD Sent: 05/22/2024   5:42 PM EDT To: True Mar, MD

## 2024-05-27 NOTE — Telephone Encounter (Signed)
 I called and LMVM for pt to return call for her [place]/EMG results.

## 2024-05-28 ENCOUNTER — Telehealth: Payer: Self-pay | Admitting: Neurology

## 2024-05-28 NOTE — Progress Notes (Signed)
 Kerry [name] - 83 y.o. female MRN [id number]  Date of birth: [birth year]  Office Visit Note: Visit Date: 05/27/2024 PCP: Berneta Elsie Sayre, MD Referred by: Berneta Elsie Sayre,*  Subjective: Chief Complaint  Patient presents with   Lower Back - Pain   HPI:  Kerry [name] is a 83 y.o. female who comes in today at the request of Duwaine Pouch, FNP for planned Right L4-5 Lumbar Transforaminal epidural steroid injection with fluoroscopic guidance.  The patient has failed conservative care including home exercise, medications, time and activity modification.  This injection will be diagnostic and hopefully therapeutic.  Please see requesting physician notes for further details and justification.   ROS Otherwise per HPI.  Assessment & Plan: Visit Diagnoses:    ICD-10-CM   1. Lumbar radiculopathy  M54.16 XR C-ARM NO REPORT    Epidural Steroid injection    methylPREDNISolone  acetate (DEPO-MEDROL ) injection 40 mg      Plan: No additional findings.   Meds & Orders:  Meds ordered this encounter  Medications   methylPREDNISolone  acetate (DEPO-MEDROL ) injection 40 mg    Orders Placed This Encounter  Procedures   XR C-ARM NO REPORT   Epidural Steroid injection    Follow-up: Return for visit to requesting provider as needed.   Procedures: No procedures performed  Lumbosacral Transforaminal Epidural Steroid Injection - Sub-Pedicular Approach with Fluoroscopic Guidance  Patient: Kerry [name]      Date of Birth: May 08, 1941 MRN: [id number] PCP: Berneta Elsie Sayre, MD      Visit Date: 05/27/2024   Universal Protocol:    Date/Time: 05/27/2024  Consent Given By: the patient  Position: PRONE  Additional Comments: Vital signs were monitored before and after the procedure. Patient was prepped and draped in the usual sterile fashion. The correct patient, procedure, and site was verified.   Injection Procedure Details:   Procedure diagnoses: Lumbar  radiculopathy [M54.16]    Meds Administered:  Meds ordered this encounter  Medications   methylPREDNISolone  acetate (DEPO-MEDROL ) injection 40 mg    Laterality: Right  Location/Site: L4  Needle:5.0 in., 22 ga.  Short bevel or Quincke spinal needle  Needle Placement: Transforaminal  Findings:    -Comments: Excellent flow of contrast along the nerve, nerve root and into the epidural space.  Procedure Details: After squaring off the end-plates to get a true AP view, the C-arm was positioned so that an oblique view of the foramen as noted above was visualized. The target area is just inferior to the nose of the scotty dog or sub pedicular. The soft tissues overlying this structure were infiltrated with 2-3 ml. of 1% Lidocaine without Epinephrine.  The spinal needle was inserted toward the target using a trajectory view along the fluoroscope beam.  Under AP and lateral visualization, the needle was advanced so it did not puncture dura and was located close the 6 O'Clock position of the pedical in AP tracterory. Biplanar projections were used to confirm position. Aspiration was confirmed to be negative for CSF and/or blood. A 1-2 ml. volume of Isovue-250 was injected and flow of contrast was noted at each level. Radiographs were obtained for documentation purposes.   After attaining the desired flow of contrast documented above, a 0.5 to 1.0 ml test dose of 0.25% Marcaine was injected into each respective transforaminal space.  The patient was observed for 90 seconds post injection.  After no sensory deficits were reported, and normal lower extremity motor function was noted,   the above injectate was  administered so that equal amounts of the injectate were placed at each foramen (level) into the transforaminal epidural space.   Additional Comments:  The patient tolerated the procedure well Dressing: 2 x 2 sterile gauze and Band-Aid    Post-procedure details: Patient was observed  during the procedure. Post-procedure instructions were reviewed.  Patient left the clinic in stable condition.    Clinical History: 05/22/2024 Summary of the test: Nerve conduction study: Nerve conduction is limited due to lower extremity edema, absent bilateral lower extremity motor and sensory response, with exception of right tibial motor response showed moderately decreased CMAP amplitude   Right ulnar and radial sensory responses were normal.   Right ulnar motor responses were within normal limit   Electromyography: Selected needle examination was performed at bilateral lower extremity muscles, lumbosacral paraspinal muscles; right upper extremity muscles and right cervical paraspinal muscles.   There is evidence of significant chronic neuropathic changes involving bilateral lumbosacral myotomes, L4-5 more than S1.  There were increased insertional activity at bilateral lower lumbar paraspinal muscles, no spontaneous activity.   There was also evidence of mild chronic neuropathic changes involving right C5-6 myotomes.  There was no spontaneous activity at right cervical paraspinal muscles.     Conclusion: This is an abnormal yet technically difficult study due to significant lower extremity pitting edema.   There was evidence of chronic bilateral lumbosacral radiculopathy involving bilateral L4-5 more than S1 myotomes.  There was also suggestion of mild right cervical radiculopathy involving right C5-6 myotomes, there was no evidence of active process.   It was difficult to evaluate the severity of her peripheral neuropathy due to lower extremity pitting edema, however in light of moderately preserved right tibial motor response, I would think her peripheral neuropathy is in the moderate degree.       ------------------------------- Modena Callander. M.D. Ph.D.    [facility]  ----- EXAM: MRI lumbar spine without contrast TECHNIQUE: Sagittal T1, T2, STIR and  axial T1 and T2 images were obtained through lumbar spine CONTRAST: None IMAGING SITE: [facility]   FINDINGS:  The lumbar vertebra demonstrate exaggerated forward lordotic curvature and spondylolisthesis of L5 or L4 vertebrae in mild disc and facet degenerative changes throughout. T12-L1 shows mild loss of disc height and facet hypertrophy but no significant compression. L1-L2 also shows minor disc degenerative change and prominent facet hypertrophy left more than right resulting in mild posterior canal narrowing but no definite compression. L2-3 shows prominent facet hypertrophy and ligamentum flavum hypertrophy resulting in moderate posterior canal and moderate left-sided foraminal and right mild sided foraminal narrowing. L3-4 also shows mild disc degenerative change and prominent facet hypertrophy and ligamentum flavum hypertrophy with moderate posterior canal narrowing moderate bilateral foraminal narrowing. L4-5 there is mild disc degenerative change and posterior subluxation of L5 over L4 vertebrae along with severe facet hypertrophy and ligamentum flavum hypertrophy resulting in severe posterior canal moderate bilateral foraminal narrowing.   L5-S1 shows prominent disc and endplate degenerative changes along with facet hypertrophy resulting in mild posterior canal and mild left-sided foraminal narrowing. The paraspinal soft tissue appear unremarkable.  Visualized portion of the lower thoracic vertebrae also show mild disc degenerative changes.  The conus medullaris terminates at L1.       IMPRESSION:   MRI scan of the lumbar spine shows prominent spondylolistheis at L4-L5 with multifactorial severe posterior canal and moderate bilateral foraminal narrowing.  L3-4 also shows moderate posterior canal narrowing and moderate bilateral foraminal narrowing.  INTERPRETING PHYSICIAN:  EATHER POPP, MD Certified in  Neuroimaging by American Society of Neuroimaging and [employer]  for Neurological Subspecialities     Objective:  VS:  HT:    WT:   BMI:     BP:(!) 183/82  HR:83bpm  TEMP: ( )  RESP:  Physical Exam Vitals and nursing note reviewed.  Constitutional:      General: She is not in acute distress.    Appearance: Normal appearance. She is not ill-appearing.  HENT:     Head: Normocephalic and atraumatic.     Right Ear: External ear normal.     Left Ear: External ear normal.  Eyes:     Extraocular Movements: Extraocular movements intact.  Cardiovascular:     Rate and Rhythm: Normal rate.     Pulses: Normal pulses.  Pulmonary:     Effort: Pulmonary effort is normal. No respiratory distress.  Abdominal:     General: There is no distension.     Palpations: Abdomen is soft.  Musculoskeletal:        General: Tenderness present.     Cervical back: Neck supple.     Right lower leg: No edema.     Left lower leg: No edema.     Comments: Patient has good distal strength with no pain over the greater trochanters.  No clonus or focal weakness.  Skin:    Findings: No erythema, lesion or rash.  Neurological:     General: No focal deficit present.     Mental Status: She is alert and oriented to person, place, and time.     Sensory: No sensory deficit.     Motor: No weakness or abnormal muscle tone.     Coordination: Coordination normal.  Psychiatric:        Mood and Affect: Mood normal.        Behavior: Behavior normal.      Imaging: No results found.

## 2024-05-28 NOTE — Telephone Encounter (Signed)
 I spoke to pt and relayed to her the [place]/EMG results per below.  She said she saw  orthopeds (Dr. Eldonna) yesterday and received injection (steroids) and she has noted some improvement already R leg still some but not as back and L leg is good. She will follow up with them in 2 wks.  She has MRI neck 06/03/2024. She verbalized understanding of results.  Appreciated call back.

## 2024-05-28 NOTE — Telephone Encounter (Signed)
 Pt has returned call for results from [place], [name]

## 2024-05-28 NOTE — Procedures (Signed)
 Lumbosacral Transforaminal Epidural Steroid Injection - Sub-Pedicular Approach with Fluoroscopic Guidance  Patient: Kerry [name]  LEEANA CREER      Date of Birth: Apr 12, 1941 MRN: [id number] PCP: Berneta Elsie Sayre, MD      Visit Date: 05/27/2024   Universal Protocol:    Date/Time: 05/27/2024  Consent Given By: the patient  Position: PRONE  Additional Comments: Vital signs were monitored before and after the procedure. Patient was prepped and draped in the usual sterile fashion. The correct patient, procedure, and site was verified.   Injection Procedure Details:   Procedure diagnoses: Lumbar radiculopathy [M54.16]    Meds Administered:  Meds ordered this encounter  Medications   methylPREDNISolone  acetate (DEPO-MEDROL ) injection 40 mg    Laterality: Right  Location/Site: L4  Needle:5.0 in., 22 ga.  Short bevel or Quincke spinal needle  Needle Placement: Transforaminal  Findings:    -Comments: Excellent flow of contrast along the nerve, nerve root and into the epidural space.  Procedure Details: After squaring off the end-plates to get a true AP view, the C-arm was positioned so that an oblique view of the foramen as noted above was visualized. The target area is just inferior to the nose of the scotty dog or sub pedicular. The soft tissues overlying this structure were infiltrated with 2-3 ml. of 1% Lidocaine without Epinephrine.  The spinal needle was inserted toward the target using a trajectory view along the fluoroscope beam.  Under AP and lateral visualization, the needle was advanced so it did not puncture dura and was located close the 6 O'Clock position of the pedical in AP tracterory. Biplanar projections were used to confirm position. Aspiration was confirmed to be negative for CSF and/or blood. A 1-2 ml. volume of Isovue-250 was injected and flow of contrast was noted at each level. Radiographs were obtained for documentation purposes.   After attaining the  desired flow of contrast documented above, a 0.5 to 1.0 ml test dose of 0.25% Marcaine was injected into each respective transforaminal space.  The patient was observed for 90 seconds post injection.  After no sensory deficits were reported, and normal lower extremity motor function was noted,   the above injectate was administered so that equal amounts of the injectate were placed at each foramen (level) into the transforaminal epidural space.   Additional Comments:  The patient tolerated the procedure well Dressing: 2 x 2 sterile gauze and Band-Aid    Post-procedure details: Patient was observed during the procedure. Post-procedure instructions were reviewed.  Patient left the clinic in stable condition.

## 2024-05-28 NOTE — Telephone Encounter (Signed)
-----   Message from True Mar sent at 05/22/2024  5:46 PM EDT ----- Please advise patient that her recent EMG and nerve conduction velocity test through our office was interpreted by Dr. Onita and shows evidence of degenerative spine disease in the lower back with  pinched nerve type findings resulting in probably some of her symptoms.  There is an element of nerve damage or what we call neuropathy but it was difficult to discern how severe it is in light of a  technically difficult study due to lower extremity swelling.  So far, we have not found a cause for neuropathy but I would like to stress that weight loss is recommended and obesity can be a risk  factor for neuropathy as well.  I would recommend that she get evaluated for degenerative spine disease by orthopedics/spine doctor.  We have already initiated a referral to orthopedics.  In fact,  she is scheduled next week with orthopedics. ----- Message ----- From: Onita Duos, MD Sent: 05/22/2024   5:42 PM EDT To: True Mar, MD

## 2024-06-01 ENCOUNTER — Other Ambulatory Visit: Payer: Self-pay | Admitting: Physician Assistant

## 2024-06-01 DIAGNOSIS — M8589 Other specified disorders of bone density and structure, multiple sites: Secondary | ICD-10-CM

## 2024-06-03 ENCOUNTER — Ambulatory Visit
Admission: RE | Admit: 2024-06-03 | Discharge: 2024-06-03 | Disposition: A | Discharge status: Home / Self Care | Source: Ambulatory Visit | Attending: Neurology | Admitting: Neurology

## 2024-06-03 DIAGNOSIS — M545 Low back pain, unspecified: Secondary | ICD-10-CM | POA: Diagnosis not present

## 2024-06-03 DIAGNOSIS — E871 Hypo-osmolality and hyponatremia: Secondary | ICD-10-CM

## 2024-06-03 DIAGNOSIS — R202 Paresthesia of skin: Secondary | ICD-10-CM | POA: Diagnosis not present

## 2024-06-03 DIAGNOSIS — M79604 Pain in right leg: Secondary | ICD-10-CM

## 2024-06-03 DIAGNOSIS — M7989 Other specified soft tissue disorders: Secondary | ICD-10-CM | POA: Diagnosis not present

## 2024-06-03 NOTE — Telephone Encounter (Signed)
 Last Fill: 03/10/2024  Labs: 03/12/2024 Sodium 131, Glucose 104, 12/06/2023 RBC 3.75, Hgb 11.8, Hct 35.0, Eosinophils Relative 5.2  Next Visit: 07/03/2024  Last Visit: 01/02/2024  DX: Osteopenia of multiple sites   Current Dose per office note 01/02/2024: Fosamax  70 mg weekly   Okay to refill Fosamax ?

## 2024-06-05 ENCOUNTER — Telehealth: Payer: Self-pay | Admitting: Neurology

## 2024-06-05 NOTE — Telephone Encounter (Signed)
 Please call patient, MRI of cervical spine showed mild degenerative changes, no evidence of spinal cord or nerve root compression     MRI cervical demonstrating: - At C4-5, C5-6: disc bulging and facet hypertrophy with mild spinal stenosis and mild bilateral foraminal stenosis.

## 2024-06-06 NOTE — Telephone Encounter (Signed)
 Lvm 1st attempt by hf 06/06/24

## 2024-06-06 NOTE — Telephone Encounter (Signed)
 Called and spoke to pt, relayed rresults/recommendations, pt voiced gratitude and understanding of all discussed

## 2024-06-12 ENCOUNTER — Ambulatory Visit

## 2024-06-19 NOTE — Progress Notes (Signed)
 Office Visit Note  Patient: Kerry [name]  BRILEY BUMGARNER             Date of Birth: [birth year]           MRN: [id number]             PCP: Berneta Elsie Sayre, MD Referring: Berneta Elsie Sayre,* Visit Date: 07/03/2024 Occupation: Data Unavailable  Subjective:  Pain in joints  History of Present Illness: Kerry  F [name] is a 83 y.o. female with osteoarthritis, degenerative disc disease and osteopenia.  She returns today after her last visit in April 2025.  She states the pain in her hands, ankles and her feet is manageable.  However she has been having discomfort in her neck and lower back.  She was evaluated by Dr. Ross and was diagnosed with disc disease of the cervical spine and lumbar spine.  She states she has had lumbar spine injections by Dr. Nutan which helped only temporarily.  She continues to have some discomfort in her lower back.  He denies any shortness of breath today.  She has been taking calcium  and vitamin D  for osteopenia.  She started physical therapy today for her lower back pain.  She states the pain still radiates into her right lower extremity and causes numbness in her foot.    Activities of Daily Living:  Patient reports morning stiffness for 0 minutes.   Patient Denies nocturnal pain.  Difficulty dressing/grooming: Reports Difficulty climbing stairs: Reports Difficulty getting out of chair: Reports Difficulty using hands for taps, buttons, cutlery, and/or writing: Denies  Review of Systems  Constitutional:  Negative for fatigue.  HENT:  Negative for mouth sores and mouth dryness.   Eyes:  Positive for dryness.  Respiratory:  Negative for shortness of breath.   Cardiovascular:  Positive for palpitations. Negative for chest pain.  Gastrointestinal:  Positive for constipation. Negative for blood in stool and diarrhea.  Endocrine: Positive for increased urination.  Genitourinary:  Positive for involuntary urination.  Musculoskeletal:  Positive for joint pain, gait  problem, joint pain, joint swelling, myalgias, muscle weakness and myalgias. Negative for morning stiffness and muscle tenderness.  Skin:  Positive for hair loss and sensitivity to sunlight. Negative for color change and rash.  Allergic/Immunologic: Negative for susceptible to infections.  Neurological:  Negative for dizziness and headaches.  Hematological:  Negative for swollen glands.  Psychiatric/Behavioral:  Negative for depressed mood and sleep disturbance. The patient is not nervous/anxious.     PMFS History:  Patient Active Problem List   Diagnosis Date Noted   Dysfunction of left eustachian tube 09/25/2023   Abnormal PFT 06/15/2023   Tricuspid regurgitation    Mitral regurgitation    Pulmonary HTN (HCC)    Prediabetes 03/15/2023   History of dehydration 03/15/2023   Osteopenia 09/14/2022   Low back pain with sciatica 08/19/2022   Gait instability 08/19/2022   B12 deficiency 03/14/2022   Hospital discharge follow-up 09/28/2021   Traumatic ecchymosis of head 09/28/2021   Chronic post-traumatic headache, not intractable 09/28/2021   Subdural hematoma (HCC) 09/24/2021   Encephalopathy acute 09/24/2021   Normocytic anemia 09/14/2021   Seasonal allergic rhinitis due to pollen 03/11/2021   DCM (dilated cardiomyopathy) (HCC)    Hyponatremia 06/21/2019   TIA (transient ischemic attack) 06/20/2019   Aortic insufficiency 08/17/2018   Hyperlipidemia    Right thyroid  nodule 02/07/2013   Right carotid bruit 07/16/2012   Ocular migraine 11/04/2011   Hypercholesterolemia 06/01/2011   Essential hypertension    History of  syncope    Asthma     Past Medical History:  Diagnosis Date   Allergy    Anemia    in her 20's   Aortic insufficiency    i   Arthritis    not dx'd- just per pt    Asthma    AS A CHILD   Cancer (HCC)    endometrial cancer with hysterectomy in 1987   Cancer (HCC)    melanoma on face    Cataract    removed both eyes    DCM (dilated cardiomyopathy) (HCC)     EF 50-55%   GERD (gastroesophageal reflux disease)    occ has with diet    History of syncope    HTN (hypertension)    Hyperlipidemia    Mitral regurgitation    mild by echo 03/2023   OSA on CPAP    Pulmonary HTN (HCC)    PASP on echo 03/2023   TIA (transient ischemic attack)    Tricuspid regurgitation    moderate by echo 03/2023    Family History  Problem Relation Age of Onset   Heart disease Mother    Kidney failure Mother    Diabetes Mother    Hyperlipidemia Mother    Hypertension Mother    Uterine cancer Mother    Dementia Father    Diabetes Sister    Multiple sclerosis Sister    Stomach cancer Sister    Dementia Sister    Throat cancer Brother        smoker   Diabetes Paternal Grandmother    Mitral valve prolapse Daughter    Colon cancer Neg Hx    Colon polyps Neg Hx    Esophageal cancer Neg Hx    Rectal cancer Neg Hx    Past Surgical History:  Procedure Laterality Date   ABDOMINAL HYSTERECTOMY     ANKLE FRACTURE SURGERY Left    APPENDECTOMY     CATARACT EXTRACTION, BILATERAL     CHOLECYSTECTOMY     COLONOSCOPY  08/01/2007   Dr Abran    DENTAL SURGERY     MELANOMA EXCISION     face   ovary removal     right leg  fracture surgery      SKIN BIOPSY     pre-cancerous on face   Social History   Tobacco Use   Smoking status: Never    Passive exposure: Past   Smokeless tobacco: Never  Vaping Use   Vaping status: Never Used  Substance Use Topics   Alcohol use: No   Drug use: No   Social History   Social History Narrative   Lives at home alone   1 cup of coffee per day    Works at [employer] History  Administered Date(s) Administered   Fluad Quad(high Dose 65+) 05/31/2022   Fluzone Influenza virus vaccine,trivalent (IIV3), split virus 06/05/2017   INFLUENZA, HIGH DOSE SEASONAL PF 07/16/2018, 08/25/2023, 06/24/2024   Influenza Inj Mdck Quad With Preservative 06/05/2017   Influenza Split 06/19/2014    Influenza-Unspecified 05/31/2015, 06/13/2016, 06/23/2020, 07/04/2021   Moderna Sars-Covid-2 Vaccination 08/01/2020   PFIZER(Purple Top)SARS-COV-2 Vaccination 11/06/2019, 11/27/2019   Pneumococcal Conjugate-13 04/14/2017   Pneumococcal Polysaccharide-23 06/28/2009   Respiratory Syncytial Virus Vaccine,Recomb Aduvanted(Arexvy) 08/25/2023   Tdap 01/06/2023     Objective: Vital Signs: BP (!) 166/70   Pulse 75   Temp (!) 97.4 F (36.3 C)   Resp 13   Ht 5' 2.5 (1.588  m)   Wt 191 lb (86.6 kg)   BMI 34.38 kg/m    Physical Exam Vitals and nursing note reviewed.  Constitutional:      Appearance: She is well-developed.  HENT:     Head: Normocephalic and atraumatic.  Eyes:     Conjunctiva/sclera: Conjunctivae normal.  Cardiovascular:     Rate and Rhythm: Normal rate and regular rhythm.     Heart sounds: Normal heart sounds.  Pulmonary:     Effort: Pulmonary effort is normal.     Breath sounds: Normal breath sounds.  Abdominal:     General: Bowel sounds are normal.     Palpations: Abdomen is soft.  Musculoskeletal:     Cervical back: Normal range of motion.  Lymphadenopathy:     Cervical: No cervical adenopathy.  Skin:    General: Skin is warm and dry.     Capillary Refill: Capillary refill takes less than 2 seconds.  Neurological:     Mental Status: She is alert and oriented to person, place, and time.  Psychiatric:        Behavior: Behavior normal.      Musculoskeletal Exam: She had limited lateral rotation, flexion and extension of the cervical spine.  She thoracic orthosis without any tenderness.  She had limited painful range of motion of the lumbar spine.  Shoulders, elbows, wrist joints were in good range of motion.  She had bilateral CMC thickening and subluxation.  PIP and DIP thickening with incomplete fist formation was noted without discomfort.  No inflammation was noted.  Hip joints and knee joints in good range of motion without any warmth swelling or effusion.   There was no tenderness over ankles or MTPs.  Pedal edema was noted to her bilateral lower extremities.  CDAI Exam: CDAI Score: -- Patient Global: --; Provider Global: -- Swollen: --; Tender: -- Joint Exam 07/03/2024   No joint exam has been documented for this visit   There is currently no information documented on the homunculus. Go to the Rheumatology activity and complete the homunculus joint exam.  Investigation: No additional findings.  Imaging: No results found.   Recent Labs: Lab Results  Component Value Date   WBC 7.9 12/06/2023   HGB 11.8 (L) 12/06/2023   PLT 222.0 12/06/2023   NA 131 (L) 03/12/2024   K 4.2 03/12/2024   CL 98 03/12/2024   CO2 28 03/12/2024   GLUCOSE 104 (H) 03/12/2024   BUN 21 03/12/2024   CREATININE 0.87 03/12/2024   BILITOT 0.5 12/06/2023   ALKPHOS 72 12/06/2023   AST 17 12/06/2023   ALT 14 12/06/2023   PROT 6.9 03/26/2024   ALBUMIN  4.0 12/06/2023   CALCIUM  9.3 03/12/2024   GFRAA 82 07/10/2020    Speciality Comments: Fosamax  started July 30,2024  Procedures:  No procedures performed Allergies: Lovastatin and Sudafed [pseudoephedrine hcl]   Assessment / Plan:     Visit Diagnoses: Osteopenia of multiple sites - 12/23: DEXA scan left femoral neck -1.9, no comparison.  She is on Fosamax  70 mg weekly since July 2024.  She has been on calcium  and vitamin D .  She will get repeat DEXA scan in December 2025.  Vitamin D  deficiency-she is currently on vitamin D  2000 units daily.  Primary osteoarthritis of both hands-she has severe osteoarthritis with CMC, PIP and DIP thickening.  She denies discomfort.  History of fracture of left ankle - 1983 after jumping in the swimming pool requiring surgery.  She continues to have chronic discomfort.  Thickening of the left ankle joint without tenderness was noted.  Paresthesia of both feet-most likely related to disc disease of the lumbar spine.  She was evaluated by neurology.  She is going to physical  therapy.  She did not have much response to cortisone injection in the lumbar spine.  DDD (degenerative disc disease), cervical -she has limited range of motion of the cervical spine.  At C4-5, C5-6: disc bulging and facet hypertrophy with mild spinal stenosis and mild bilateral foraminal stenosis.  Spondylosis of lumbar spine - Multilevel spondylosis, L4-L5 with spondylolisthesis and facet joint arthropathy was noted on the x-rays.  She is going to physical therapy.  Unstable gait - Left ankle fracture 1983, right tibia fracture 1989 after significant falls.  Improvement noted after physical therapy per patient.  Essential hypertension-blood pressure was elevated at 166/70.  She was advised to monitor blood pressure closely and follow-up with her PCP.  Other medical problems are listed as follows:  DCM (dilated cardiomyopathy) (HCC)  Pedal edema-unchanged.  Subdural hematoma (HCC) - 2023 after a fall.  TIA (transient ischemic attack)  Hypercholesterolemia  Mild intermittent asthma, unspecified whether complicated  Nonrheumatic aortic valve insufficiency  History of melanoma  Seasonal allergic rhinitis due to pollen  Right thyroid  nodule  Ocular migraine  Orders: No orders of the defined types were placed in this encounter.  No orders of the defined types were placed in this encounter.    Follow-Up Instructions: Return in about 6 months (around 01/01/2025) for Osteoarthritis.   Maya Nash, MD  Note - This record has been created using Animal nutritionist.  Chart creation errors have been sought, but may not always  have been located. Such creation errors do not reflect on  the standard of medical care.

## 2024-06-24 ENCOUNTER — Ambulatory Visit: Payer: Medicare Other

## 2024-06-24 VITALS — BP 136/62 | HR 83 | Temp 98.6°F | Ht 62.5 in | Wt 191.0 lb

## 2024-06-24 DIAGNOSIS — Z23 Encounter for immunization: Secondary | ICD-10-CM | POA: Diagnosis not present

## 2024-06-24 DIAGNOSIS — Z0000 Encounter for general adult medical examination without abnormal findings: Secondary | ICD-10-CM

## 2024-06-24 NOTE — Patient Instructions (Addendum)
 Kerry [name],  Thank you for taking the time for your Medicare Wellness Visit. I appreciate your continued commitment to your health goals. Please review the care plan we discussed, and feel free to reach out if I can assist you further.  Medicare recommends these wellness visits once per year to help you and your care team stay ahead of potential health issues. These visits are designed to focus on prevention, allowing your provider to concentrate on managing your acute and chronic conditions during your regular appointments.  Please note that Annual Wellness Visits do not include a physical exam. Some assessments may be limited, especially if the visit was conducted virtually. If needed, we may recommend a separate in-person follow-up with your provider.  Ongoing Care Seeing your primary care provider every 3 to 6 months helps us  monitor your health and provide consistent, personalized care.   Referrals If a referral was made during today's visit and you haven't received any updates within two weeks, please contact the referred provider directly to check on the status.  Recommended Screenings:  Health Maintenance  Topic Date Due   Zoster (Shingles) Vaccine (1 of 2) Never done   Medicare Annual Wellness Visit  06/24/2025   Pneumococcal Vaccine for age over 95  Completed   Flu Shot  Completed   DEXA scan (bone density measurement)  Completed   Meningitis B Vaccine  Aged Out   DTaP/Tdap/Td vaccine  Discontinued   COVID-19 Vaccine  Discontinued       06/24/2024    8:20 AM  Advanced Directives  Does Patient Have a Medical Advance Directive? No  Would patient like information on creating a medical advance directive? No - Patient declined   Advance Care Planning is important because it: Ensures you receive medical care that aligns with your values, goals, and preferences. Provides guidance to your family and loved ones, reducing the emotional burden of decision-making during critical  moments.  Vision: Annual vision screenings are recommended for early detection of glaucoma, cataracts, and diabetic retinopathy. These exams can also reveal signs of chronic conditions such as diabetes and high blood pressure.  Dental: Annual dental screenings help detect early signs of oral cancer, gum disease, and other conditions linked to overall health, including heart disease and diabetes.  Please see the attached documents for additional preventive care recommendations.

## 2024-06-24 NOTE — Progress Notes (Signed)
 Subjective:   Kerry [name] is a 83 y.o. who presents for a Medicare Wellness preventive visit.  As a reminder, Annual Wellness Visits don't include a physical exam, and some assessments may be limited, especially if this visit is performed virtually. We may recommend an in-person follow-up visit with your provider if needed.  Visit Complete: In person    Persons Participating in Visit: Patient.  AWV Questionnaire: No: Patient Medicare AWV questionnaire was not completed prior to this visit.  Cardiac Risk Factors include: advanced age (>84men, >33 women);dyslipidemia;hypertension;obesity (BMI >30kg/m2)     Objective:    Today's Vitals   06/24/24 0812 06/24/24 0813  BP: 136/62   Pulse: 83   Temp: 98.6 F (37 C)   TempSrc: Oral   SpO2: 96%   Weight: 191 lb (86.6 kg)   Height: 5' 2.5 (1.588 m)   PainSc:  3    Body mass index is 34.38 kg/m.     06/24/2024    8:20 AM 06/19/2023    8:13 AM 03/08/2023    9:57 PM 06/14/2022    8:57 AM 09/24/2021    6:52 PM 06/05/2021   10:04 AM 08/10/2020    8:41 AM  Advanced Directives  Does Patient Have a Medical Advance Directive? No No No No No No No  Would patient like information on creating a medical advance directive? No - Patient declined No - Patient declined No - Patient declined No - Patient declined No - Patient declined No - Patient declined No - Patient declined    Current Medications (verified) Outpatient Encounter Medications as of 06/24/2024  Medication Sig   acetaminophen  (TYLENOL ) 500 MG tablet Take 500 mg by mouth every 6 (six) hours as needed for moderate pain or headache.    alendronate  (FOSAMAX ) 70 MG tablet TAKE 1 TABLET(70 MG) BY MOUTH 1 TIME A WEEK WITH A FULL GLASS OF WATER AND ON AN EMPTY STOMACH   Ascorbic Acid (VITAMIN C PO) Take 1 tablet by mouth daily.   aspirin  81 MG chewable tablet Chew 81 mg by mouth every other day.   atorvastatin  (LIPITOR) 20 MG tablet TAKE 1 TABLET(20 MG) BY MOUTH DAILY    carvedilol  (COREG ) 12.5 MG tablet TAKE 1 TABLET(12.5 MG) BY MOUTH TWICE DAILY   Cholecalciferol (VITAMIN D3) 2000 units capsule Take 2,000 Units by mouth daily.   Coenzyme Q10-Vitamin E (QUNOL ULTRA COQ10 PO) Take 1 capsule by mouth daily.   Cyanocobalamin  500 MCG CHEW Take one daily   fexofenadine (ALLEGRA) 180 MG tablet Take 180 mg by mouth every morning.   fluticasone  (FLONASE ) 50 MCG/ACT nasal spray SHAKE LIQUID AND USE 2 SPRAYS IN EACH NOSTRIL DAILY   fluticasone  furoate-vilanterol (BREO ELLIPTA ) 100-25 MCG/ACT AEPB Inhale 1 puff into the lungs daily.   Iron , Ferrous Sulfate , 325 (65 Fe) MG TABS Take 325 mg by mouth daily.   lisinopril  (ZESTRIL ) 20 MG tablet TAKE 1 TABLET(20 MG) BY MOUTH DAILY   Misc Natural Products (NEURIVA PO) Take 1 tablet by mouth daily.   Multiple Vitamin (MULTIVITAMIN WITH MINERALS) TABS tablet Take 1 tablet by mouth daily.   pantoprazole  (PROTONIX ) 40 MG tablet Take 1 tablet (40 mg total) by mouth daily.   Polyethyl Glycol-Propyl Glycol (SYSTANE) 0.4-0.3 % SOLN Place 1 drop into both eyes daily as needed (dry eyes).   Facility-Administered Encounter Medications as of 06/24/2024  Medication   fluticasone  furoate-vilanterol (BREO ELLIPTA ) 100-25 MCG/ACT 1 puff    Allergies (verified) Lovastatin and Sudafed [pseudoephedrine hcl]  History: Past Medical History:  Diagnosis Date   Allergy    Anemia    in her 40's   Aortic insufficiency    i   Arthritis    not dx'd- just per pt    Asthma    AS A CHILD   Cancer (HCC)    endometrial cancer with hysterectomy in 1987   Cancer (HCC)    melanoma on face    Cataract    removed both eyes    DCM (dilated cardiomyopathy) (HCC)    EF 50-55%   GERD (gastroesophageal reflux disease)    occ has with diet    History of syncope    HTN (hypertension)    Hyperlipidemia    Mitral regurgitation    mild by echo 03/2023   OSA on CPAP    Pulmonary HTN (HCC)    PASP on echo 03/2023   TIA (transient ischemic  attack)    Tricuspid regurgitation    moderate by echo 03/2023   Past Surgical History:  Procedure Laterality Date   ABDOMINAL HYSTERECTOMY     ANKLE FRACTURE SURGERY Left    APPENDECTOMY     CATARACT EXTRACTION, BILATERAL     CHOLECYSTECTOMY     COLONOSCOPY  08/01/2007   Dr Abran    DENTAL SURGERY     MELANOMA EXCISION     face   ovary removal     right leg  fracture surgery      SKIN BIOPSY     pre-cancerous on face   Family History  Problem Relation Age of Onset   Heart disease Mother    Kidney failure Mother    Diabetes Mother    Hyperlipidemia Mother    Hypertension Mother    Uterine cancer Mother    Dementia Father    Diabetes Sister    Multiple sclerosis Sister    Stomach cancer Sister    Dementia Sister    Throat cancer Brother        smoker   Diabetes Paternal Grandmother    Mitral valve prolapse Daughter    Colon cancer Neg Hx    Colon polyps Neg Hx    Esophageal cancer Neg Hx    Rectal cancer Neg Hx    Social History   Socioeconomic History   Marital status: Widowed    Spouse name: Not on file   Number of children: 2   Years of education: Not on file   Highest education level: Not on file  Occupational History   Occupation: retired  Tobacco Use   Smoking status: Never    Passive exposure: Past   Smokeless tobacco: Never  Vaping Use   Vaping status: Never Used  Substance and Sexual Activity   Alcohol use: No   Drug use: No   Sexual activity: Not Currently  Other Topics Concern   Not on file  Social History Narrative   Lives at home alone   1 cup of coffee per day    Works at [employer] Strain: Low Risk  (06/24/2024)   Overall Financial Resource Strain (CARDIA)    Difficulty of Paying Living Expenses: Not hard at all  Food Insecurity: No Food Insecurity (06/24/2024)   Hunger Vital Sign    Worried About Running Out of Food in the Last Year: Never true    Ran Out of Food in the  Last Year: Never true  Transportation Needs: No  Transportation Needs (06/24/2024)   PRAPARE - Administrator, Civil Service (Medical): No    Lack of Transportation (Non-Medical): No  Physical Activity: Insufficiently Active (06/24/2024)   Exercise Vital Sign    Days of Exercise per Week: 7 days    Minutes of Exercise per Session: 10 min  Stress: No Stress Concern Present (06/24/2024)   [employer] of Occupational Health - Occupational Stress Questionnaire    Feeling of Stress: Not at all  Social Connections: Moderately Isolated (06/24/2024)   Social Connection and Isolation Panel    Frequency of Communication with Friends and Family: More than three times a week    Frequency of Social Gatherings with Friends and Family: More than three times a week    Attends Religious Services: More than 4 times per year    Active Member of [employer] or Organizations: No    Attends Banker Meetings: Never    Marital Status: Widowed    Tobacco Counseling Counseling given: Not Answered    Clinical Intake:  Pre-visit preparation completed: Yes  Pain : 0-10 Pain Score: 3  Pain Type: Chronic pain Pain Location: Leg Pain Orientation: Right Pain Descriptors / Indicators: Aching Pain Onset: More than a month ago Pain Frequency: Intermittent     Nutritional Status: BMI > 30  Obese Nutritional Risks: None Diabetes: No  Lab Results  Component Value Date   HGBA1C 6.1 03/12/2024   HGBA1C 5.9 11/06/2023   HGBA1C 5.9 06/15/2023     How often do you need to have someone help you when you read instructions, pamphlets, or other written materials from your doctor or pharmacy?: 1 - Never  Interpreter Needed?: No  Information entered by :: NAllen LPN   Activities of Daily Living     06/24/2024    8:14 AM  In your present state of health, do you have any difficulty performing the following activities:  Hearing? 0  Vision? 0  Difficulty concentrating or making  decisions? 0  Walking or climbing stairs? 0  Dressing or bathing? 0  Doing errands, shopping? 0  Preparing Food and eating ? N  Using the Toilet? N  In the past six months, have you accidently leaked urine? Y  Do you have problems with loss of bowel control? N  Managing your Medications? N  Managing your Finances? N  Housekeeping or managing your Housekeeping? N    Patient Care Team: Berneta Elsie Sayre, MD as PCP - General (Family Medicine) Leva Rush, MD as Consulting Physician (Obstetrics and Gynecology) Shlomo Wilbert SAUNDERS, MD as Consulting Physician (Cardiology) [facility]  I have updated your Care Teams any recent Medical Services you may have received from other providers in the past year.     Assessment:   This is a routine wellness examination for Nick .  Hearing/Vision screen Hearing Screening - Comments:: Denies hearing issues Vision Screening - Comments:: Regular eye exams, [facility]   Goals Addressed             This Visit's Progress    Patient Stated       06/24/2024, wants to lose weight       Depression Screen     06/24/2024    8:21 AM 06/19/2023    8:15 AM 04/28/2023    3:05 PM 09/14/2022    8:17 AM 08/12/2022   11:32 AM 06/14/2022    8:56 AM 09/28/2021    2:32 PM  PHQ 2/9 Scores  PHQ -  2 Score 0 0 0 0 0 0 0  PHQ- 9 Score 0 0         Fall Risk     06/24/2024    8:20 AM 03/26/2024    2:03 PM 03/12/2024    9:07 AM 06/19/2023    8:15 AM 04/28/2023    3:05 PM  Fall Risk   Falls in the past year? 0 0  0 0  Number falls in past yr: 0 0 0 0 0  Injury with Fall? 0 0 0 0 0  Risk for fall due to : Medication side effect No Fall Risks  Medication side effect No Fall Risks  Follow up Falls evaluation completed;Falls prevention discussed Falls evaluation completed  Falls prevention discussed;Falls evaluation completed Falls prevention discussed    MEDICARE RISK AT HOME:  Medicare Risk at Home Any stairs in or around the  home?: No If so, are there any without handrails?: No Home free of loose throw rugs in walkways, pet beds, electrical cords, etc?: Yes Adequate lighting in your home to reduce risk of falls?: Yes Life alert?: No Use of a cane, walker or w/c?: No Grab bars in the bathroom?: Yes Shower chair or bench in shower?: No Elevated toilet seat or a handicapped toilet?: No  TIMED UP AND GO:  Was the test performed?  Yes  Length of time to ambulate 10 feet: 5 sec Gait steady and fast without use of assistive device  Cognitive Function: 6CIT completed        06/24/2024    8:22 AM 06/19/2023    8:17 AM 06/14/2022    8:58 AM 06/05/2021   10:07 AM  6CIT Screen  What Year? 0 points 0 points 0 points 0 points  What month? 0 points 0 points 0 points 0 points  What time? 0 points 0 points 0 points 0 points  Count back from 20 0 points 0 points 0 points 0 points  Months in reverse 0 points 0 points 0 points 0 points  Repeat phrase 0 points 2 points 0 points 0 points  Total Score 0 points 2 points 0 points 0 points    Immunizations Immunization History  Administered Date(s) Administered   Fluad Quad(high Dose 65+) 05/31/2022   Fluzone Influenza virus vaccine,trivalent (IIV3), split virus 06/05/2017   INFLUENZA, HIGH DOSE SEASONAL PF 07/16/2018, 08/25/2023   Influenza Inj Mdck Quad With Preservative 06/05/2017   Influenza Split 06/19/2014   Influenza-Unspecified 05/31/2015, 06/13/2016, 06/23/2020, 07/04/2021   Moderna Sars-Covid-2 Vaccination 08/01/2020   PFIZER(Purple Top)SARS-COV-2 Vaccination 11/06/2019, 11/27/2019   Pneumococcal Conjugate-13 04/14/2017   Pneumococcal Polysaccharide-23 06/28/2009   Respiratory Syncytial Virus Vaccine,Recomb Aduvanted(Arexvy) 08/25/2023   Tdap 01/06/2023    Screening Tests Health Maintenance  Topic Date Due   Zoster Vaccines- Shingrix (1 of 2) Never done   Influenza Vaccine  04/12/2024   Medicare Annual Wellness (AWV)  06/24/2025   Pneumococcal  Vaccine: 50+ Years  Completed   DEXA SCAN  Completed   Meningococcal B Vaccine  Aged Out   DTaP/Tdap/Td  Discontinued   COVID-19 Vaccine  Discontinued    Health Maintenance Items Addressed: Vaccines Given today: flu, Thinking about shingles vaccine  Additional Screening:  Vision Screening: Recommended annual ophthalmology exams for early detection of glaucoma and other disorders of the eye. Is the patient up to date with their annual eye exam?  Yes  Who is the provider or what is the name of the office in which the patient attends annual eye exams?  [facility]   Dental Screening: Recommended annual dental exams for proper oral hygiene  Community Resource Referral / Chronic Care Management: CRR required this visit?  No   CCM required this visit?  No   Plan:    I have personally reviewed and noted the following in the patient's chart:   Medical and social history Use of alcohol, tobacco or illicit drugs  Current medications and supplements including opioid prescriptions. Patient is not currently taking opioid prescriptions. Functional ability and status Nutritional status Physical activity Advanced directives List of other physicians Hospitalizations, surgeries, and ER visits in previous 12 months Vitals Screenings to include cognitive, depression, and falls Referrals and appointments  In addition, I have reviewed and discussed with patient certain preventive protocols, quality metrics, and best practice recommendations. A written personalized care plan for preventive services as well as general preventive health recommendations were provided to patient.   Ardella FORBES Dawn, LPN   [id number]   After Visit Summary: (In Person-Printed) AVS printed and given to the patient  Notes: Nothing significant to report at this time.

## 2024-07-03 ENCOUNTER — Other Ambulatory Visit: Payer: Self-pay

## 2024-07-03 ENCOUNTER — Ambulatory Visit: Attending: Rheumatology | Admitting: Rheumatology

## 2024-07-03 ENCOUNTER — Encounter: Payer: Self-pay | Admitting: Rheumatology

## 2024-07-03 ENCOUNTER — Ambulatory Visit: Attending: Family Medicine

## 2024-07-03 VITALS — BP 169/69 | HR 76 | Temp 97.4°F | Resp 13 | Ht 62.5 in | Wt 191.0 lb

## 2024-07-03 DIAGNOSIS — E041 Nontoxic single thyroid nodule: Secondary | ICD-10-CM

## 2024-07-03 DIAGNOSIS — Z8781 Personal history of (healed) traumatic fracture: Secondary | ICD-10-CM | POA: Diagnosis not present

## 2024-07-03 DIAGNOSIS — M47816 Spondylosis without myelopathy or radiculopathy, lumbar region: Secondary | ICD-10-CM | POA: Diagnosis not present

## 2024-07-03 DIAGNOSIS — M79604 Pain in right leg: Secondary | ICD-10-CM | POA: Insufficient documentation

## 2024-07-03 DIAGNOSIS — M7989 Other specified soft tissue disorders: Secondary | ICD-10-CM | POA: Diagnosis not present

## 2024-07-03 DIAGNOSIS — R293 Abnormal posture: Secondary | ICD-10-CM | POA: Insufficient documentation

## 2024-07-03 DIAGNOSIS — R202 Paresthesia of skin: Secondary | ICD-10-CM

## 2024-07-03 DIAGNOSIS — Z85820 Personal history of malignant melanoma of skin: Secondary | ICD-10-CM

## 2024-07-03 DIAGNOSIS — R600 Localized edema: Secondary | ICD-10-CM | POA: Diagnosis not present

## 2024-07-03 DIAGNOSIS — M5459 Other low back pain: Secondary | ICD-10-CM | POA: Diagnosis present

## 2024-07-03 DIAGNOSIS — M19041 Primary osteoarthritis, right hand: Secondary | ICD-10-CM | POA: Diagnosis not present

## 2024-07-03 DIAGNOSIS — E7800 Pure hypercholesterolemia, unspecified: Secondary | ICD-10-CM

## 2024-07-03 DIAGNOSIS — E871 Hypo-osmolality and hyponatremia: Secondary | ICD-10-CM | POA: Insufficient documentation

## 2024-07-03 DIAGNOSIS — M79605 Pain in left leg: Secondary | ICD-10-CM | POA: Insufficient documentation

## 2024-07-03 DIAGNOSIS — M6281 Muscle weakness (generalized): Secondary | ICD-10-CM | POA: Insufficient documentation

## 2024-07-03 DIAGNOSIS — R2681 Unsteadiness on feet: Secondary | ICD-10-CM | POA: Diagnosis not present

## 2024-07-03 DIAGNOSIS — I351 Nonrheumatic aortic (valve) insufficiency: Secondary | ICD-10-CM

## 2024-07-03 DIAGNOSIS — M19042 Primary osteoarthritis, left hand: Secondary | ICD-10-CM

## 2024-07-03 DIAGNOSIS — M5030 Other cervical disc degeneration, unspecified cervical region: Secondary | ICD-10-CM

## 2024-07-03 DIAGNOSIS — M545 Low back pain, unspecified: Secondary | ICD-10-CM | POA: Diagnosis not present

## 2024-07-03 DIAGNOSIS — I420 Dilated cardiomyopathy: Secondary | ICD-10-CM | POA: Diagnosis not present

## 2024-07-03 DIAGNOSIS — G8929 Other chronic pain: Secondary | ICD-10-CM | POA: Insufficient documentation

## 2024-07-03 DIAGNOSIS — G43109 Migraine with aura, not intractable, without status migrainosus: Secondary | ICD-10-CM

## 2024-07-03 DIAGNOSIS — S065XAA Traumatic subdural hemorrhage with loss of consciousness status unknown, initial encounter: Secondary | ICD-10-CM | POA: Diagnosis not present

## 2024-07-03 DIAGNOSIS — J301 Allergic rhinitis due to pollen: Secondary | ICD-10-CM

## 2024-07-03 DIAGNOSIS — J4520 Mild intermittent asthma, uncomplicated: Secondary | ICD-10-CM

## 2024-07-03 DIAGNOSIS — M8589 Other specified disorders of bone density and structure, multiple sites: Secondary | ICD-10-CM | POA: Diagnosis not present

## 2024-07-03 DIAGNOSIS — I10 Essential (primary) hypertension: Secondary | ICD-10-CM | POA: Diagnosis not present

## 2024-07-03 DIAGNOSIS — E559 Vitamin D deficiency, unspecified: Secondary | ICD-10-CM

## 2024-07-03 DIAGNOSIS — G459 Transient cerebral ischemic attack, unspecified: Secondary | ICD-10-CM

## 2024-07-03 NOTE — Therapy (Signed)
 OUTPATIENT PHYSICAL THERAPY THORACOLUMBAR EVALUATION   Patient Name: Kerry  NATALIYAH [name] MRN: [id number] DOB:04-21-1941, 83 y.o., female Today's Date: 07/03/2024  END OF SESSION:  PT End of Session - 07/03/24 0929     Visit Number 1    Date for Recertification  09/25/24    Progress Note Due on Visit 10    PT Start Time [phone]    PT Stop Time 0930    PT Time Calculation (min) 38 min    Activity Tolerance Patient tolerated treatment well    Behavior During Therapy [facility] for tasks assessed/performed          Past Medical History:  Diagnosis Date   Allergy    Anemia    in her 20's   Aortic insufficiency    i   Arthritis    not dx'd- just per pt    Asthma    AS A CHILD   Cancer (HCC)    endometrial cancer with hysterectomy in 1987   Cancer (HCC)    melanoma on face    Cataract    removed both eyes    DCM (dilated cardiomyopathy) (HCC)    EF 50-55%   GERD (gastroesophageal reflux disease)    occ has with diet    History of syncope    HTN (hypertension)    Hyperlipidemia    Mitral regurgitation    mild by echo 03/2023   OSA on CPAP    Pulmonary HTN (HCC)    PASP on echo 03/2023   TIA (transient ischemic attack)    Tricuspid regurgitation    moderate by echo 03/2023   Past Surgical History:  Procedure Laterality Date   ABDOMINAL HYSTERECTOMY     ANKLE FRACTURE SURGERY Left    APPENDECTOMY     CATARACT EXTRACTION, BILATERAL     CHOLECYSTECTOMY     COLONOSCOPY  08/01/2007   Dr Abran    DENTAL SURGERY     MELANOMA EXCISION     face   ovary removal     right leg  fracture surgery      SKIN BIOPSY     pre-cancerous on face   Patient Active Problem List   Diagnosis Date Noted   Dysfunction of left eustachian tube 09/25/2023   Abnormal PFT 06/15/2023   Tricuspid regurgitation    Mitral regurgitation    Pulmonary HTN (HCC)    Prediabetes 03/15/2023   History of dehydration 03/15/2023   Osteopenia 09/14/2022   Low back pain with sciatica 08/19/2022    Gait instability 08/19/2022   B12 deficiency 03/14/2022   Hospital discharge follow-up 09/28/2021   Traumatic ecchymosis of head 09/28/2021   Chronic post-traumatic headache, not intractable 09/28/2021   Subdural hematoma (HCC) 09/24/2021   Encephalopathy acute 09/24/2021   Normocytic anemia 09/14/2021   Seasonal allergic rhinitis due to pollen 03/11/2021   DCM (dilated cardiomyopathy) (HCC)    Hyponatremia 06/21/2019   TIA (transient ischemic attack) 06/20/2019   Aortic insufficiency 08/17/2018   Hyperlipidemia    Right thyroid  nodule 02/07/2013   Right carotid bruit 07/16/2012   Ocular migraine 11/04/2011   Hypercholesterolemia 06/01/2011   Essential hypertension    History of syncope    Asthma     PCP: Berneta Fallow MD  REFERRING PROVIDER: Onita Duos, MD  REFERRING DIAG:  R20.2 (ICD-10-CM) - Paresthesia of both lower extremities  M79.89 (ICD-10-CM) - Swelling of both lower extremities  M79.604,M79.605 (ICD-10-CM) - Pain in both lower extremities  E87.1 (ICD-10-CM) - Low sodium  levels  M54.50,G89.29 (ICD-10-CM) - Chronic midline low back pain without sciatica    Rationale for Evaluation and Treatment: Rehabilitation  THERAPY DIAG:  Unstable gait - Plan: PT plan of care cert/re-cert  Abnormal posture - Plan: PT plan of care cert/re-cert  Other low back pain - Plan: PT plan of care cert/re-cert  Muscle weakness (generalized) - Plan: PT plan of care cert/re-cert  ONSET DATE: 05/22/24  SUBJECTIVE:                                                                                                                                                                                           SUBJECTIVE STATEMENT: These Sx just came on suddenly I dont know why, numbness and tingling radiating from my R lower leg up to my post thigh and hip.  Comes on randomly ,  worse with standing.  I've lost some height this year too  PERTINENT HISTORY:  Referred by neurology due to new  onset of lower leg paraesthesia,  and B lower lumbar/SI region pain.  Had injection which helped with the pain but not the paraesthesias  PAIN:  Are you having pain? Yes: NPRS scale: 0 to 7 Pain location: R lower leg, laterally, extends up to post thigh and post hip/ lower back B Pain description: deep pain , cramping type sensation Aggravating factors: standing prolonged Relieving factors: rest   PRECAUTIONS: Fall  RED FLAGS: None   WEIGHT BEARING RESTRICTIONS: No  FALLS:  Has patient fallen in last 6 months? No  LIVING ENVIRONMENT: Lives with: lives alone Lives in: House/apartment Stairs: has exterior stairs with handrail, also reports lives [place] a hill so fearful of falling when she checks the mail, her neighbor gets the mail a lot for her Has following equipment at home: Single point cane  OCCUPATION: works retail 2 days a week at [employer]  PLOF: Independent  PATIENT GOALS: I would like to be able to stand better  NEXT MD VISIT: sees her rheumatologist  today   OBJECTIVE:  Note: Objective measures were completed at Evaluation unless otherwise noted.  DIAGNOSTIC FINDINGS:  MRI cervical demonstrating: - At C4-5, C5-6: disc bulging and facet hypertrophy with mild spinal stenosis and mild bilateral foraminal stenosis.          Electronically signed by Onita Duos, MD at 06/05/2024  4:44 PM MRI of lumbar spine showed severe degenerative changes L 4-5, posterior subluxation of L5 over L4, with severe canal stenosis, bilateral foraminal narrowing, EMG nerve conduction study demonstrated mild to moderate length-dependent peripheral neuropathy, chronic bilateral lumbosacral radiculopathy, may have a component of mild chronic right cervical radiculopathy PATIENT SURVEYS:  Modified Oswestry Low Back Pain Disability Questionnaire: 21 / 50 = 42.0 %  COGNITION: Overall cognitive status: Within functional limits for tasks assessed     SENSATION: Gross loss of light touch  reported B lower legs and feet but reports intermittent sensory changes  MUSCLE LENGTH: Hamstrings: Right wnl deg; Left wnl deg Debby test: Right nt deg; Left nt deg  POSTURE: multiple postural/ arthritic changes, B genu valgum, R iliac crest elevated with scoliotic lumbar and thoracic, flexed and IR B hips to 15 degrees. B lower legs /ankles edematous , L greater than R  PALPATION: Tender B lower legs over TA, L ankle throughout  LUMBAR ROM:   AROM eval  Flexion Wnl, able to touch floor from standing  Extension 5 degree with p!  Right lateral flexion 50  Left lateral flexion 35 with pain L lat hip  Right rotation   Left rotation    (Blank rows = not tested)  LOWER EXTREMITY ROM:     Passive  Right eval Left eval  Hip flexion wnl wnl  Hip extension -15 -15  Hip abduction    Hip adduction    Hip internal rotation wnl wnl  Hip external rotation wnl wnl  Knee flexion    Knee extension    Ankle dorsiflexion    Ankle plantarflexion    Ankle inversion    Ankle eversion     (Blank rows = not tested)  LOWER EXTREMITY MMT:    MMT Right eval Left eval  Hip flexion 4 4  Hip extension 3+ 3+  Hip abduction    Hip adduction    Hip internal rotation    Hip external rotation    Knee flexion 5 5  Knee extension    Ankle dorsiflexion  5 5  Ankle plantarflexion 3- 3-  Ankle inversion    Ankle eversion     (Blank rows = not tested)  LUMBAR SPECIAL TESTS:  Straight leg raise test: Negative, Slump test: Negative, and FABER test: Negative  FUNCTIONAL TESTS:  Functional gait assessment:  FUNCTIONAL GAIT ASSESSMENT  Date: 07/03/24 Score  GAIT LEVEL SURFACE Instructions: Walk at your normal speed from here to the next mark (6 m) [20 ft]. (1) Moderate impairment-Walks 6 m (20 ft), slow speed, abnormal gait pattern, evidence for imbalance, or deviates 25.4 - 38.1 cm (10 -15 in) outside of the 30.48-cm (12-in) walkway width. Requires more than 7 seconds to ambulate 6 m (20  ft).  2.   CHANGE IN GAIT SPEED Instructions: Begin walking at your normal pace (for 1.5 m [5 ft]). When I tell you "go," walk as fast as you can (for 1.5 m [5 ft]). When I tell you "slow," walk as slowly as you can (for 1.5 m [5 ft]. (1) Moderate impairment - Makes only minor adjustments to walking speed, or accomplishes a change in speed with significant gait deviations, deviates 25.4 -38.1 cm (10 -15 in) outside the 30.48-cm (12-in) walkway width, or changes speed but loses balance but is able to recover and continue walking.  3.    GAIT WITH HORIZONTAL HEAD TURNS Instructions: Walk from here to the next mark 6 m (20 ft) away. Begin walking at your normal pace. Keep walking straight; after 3 steps, turn your head to the right and keep walking straight while looking to the right. After 3 more steps, turn your head to the left and keep walking straight while looking left. Continue alternating looking right and left. (1) Moderate impairment -  Performs head turns with moderate change in gait velocity, slows down, deviates 25.4 -38.1 cm (10 -15 in) outside 30.48-cm (12-in) walkway width but recovers, can continue to walk.  4.   GAIT WITH VERTICAL HEAD TURNS Instructions: Walk from here to the next mark (6 m [20 ft]). Begin walking at your normal pace. Keep walking straight; after 3 steps, tip your head up and keep walking straight while looking up. After 3 more steps, tip your head down, keep walking straight while looking down. Continue  alternating looking up and down every 3 steps until you have completed 2 repetitions in each direction. (1) Moderate impairment - Performs task with moderate change in gait velocity, slows down, deviates 25.4 -38.1 cm (10 -15 in) outside 30.48-cm (12-in) walkway width but recovers, can continue to walk.  5.  GAIT AND PIVOT TURN Instructions: Begin with walking at your normal pace. When I tell you, "turn and stop," turn as quickly as you can to face the opposite direction and  stop. (1) Moderate impairment - Turns slowly, requires verbal cueing, or requires several small steps to catch balance following turn and stop  6.   STEP OVER OBSTACLE Instructions: Begin walking at your normal speed. When you come to the shoe box, step over it, not around it, and keep walking. (1) Moderate impairment - Is able to step over one shoe box (11.43 cm [4.5 in] total height) but must slow down and adjust steps to clear box safely. May require verbal cueing.  7.   GAIT WITH NARROW BASE OF SUPPORT Instructions: Walk on the floor with arms folded across the chest, feet aligned heel to toe in tandem for a distance of 3.6 m [12 ft]. The number of steps taken in a straight line are counted for a maximum of 10 steps. (0) Severe impairment - Ambulates less than 4 steps heel to toe or cannot perform without assistance.  8.   GAIT WITH EYES CLOSED Instructions: Walk at your normal speed from here to the next mark (6 m [20 ft]) with your eyes closed. (2) Mild impairment - Walks 6 m (20 ft), uses assistive device, slower speed, mild gait deviations, deviates 15.24 -25.4 cm (6 -10 in) outside 30.48-cm (12-in) walkway width. Ambulates 6 m (20 ft) in less than 9 seconds but greater than 7 seconds  9.   AMBULATING BACKWARDS Instructions: Walk backwards until I tell you to stop (2) Mild impairment - Walks 6 m (20 ft), uses assistive device, slower speed, mild gait deviations, deviates 15.24 -25.4 cm (6 -10 in) outside 30.48-cm (12-in) walkway width  10. STEPS Instructions: Walk up these stairs as you would at home (ie, using the rail if necessary). At the top turn around and walk down. (1) Moderate impairment-Two feet to a stair; must use rail.  Total 11/30   Interpretation of scores: Non-Specific Older Adults Cutoff Score: <=22/30 = risk of falls Parkinson's Disease Cutoff score <15/30= fall risk (Hoehn & Yahr 1-4)  Minimally Clinically Important Difference (MCID)  Stroke (acute, subacute, and chronic)  = MDC: 4.2 points Vestibular (acute) = MDC: 6 points Community Dwelling Older Adults =  MCID: 4 points Parkinson's Disease  =  MDC: 4.3 points  (Academy of Neurologic Physical Therapy (nd). Functional Gait Assessment. Retrieved from https://www.neuropt.org/docs/default-source/cpgs/core-outcome-measures/function-gait-assessment-pocket-guide-proof9-(2).pdf?sfvrsn=b77f35043_0.)  GAIT: Distance walked: in clinic 20' Assistive device utilized: Single point cane Level of assistance: Modified independence Comments: wide base of support, minimal ankle movement B plants on B forefeet   TREATMENT DATE: 07/03/24: Carlene:  Instructed in supine LTR, and supine hip/piriformis stretch each leg Education regarding anatomy of spine and goal for therex for spinal decompression  Instructed to trial lumbar support/corset  which she has at home to reduce compressive forces lumbar spine.                                                                                                                                   PATIENT EDUCATION:  Education details: POC, goals  Person educated: Patient Education method: Explanation, Demonstration, Tactile cues, Verbal cues, and Handouts Education comprehension: verbalized understanding and returned demonstration  HOME EXERCISE PROGRAM: Access Code: [email or web address] URL: [email or web address] Date: 07/03/2024 Prepared by: Mischa Pollard  Exercises - Lower Trunk Rotations  - 1 x daily - 7 x weekly - 3 sets - 10 reps - Supine Piriformis Stretch with Foot on Ground  - 1 x daily - 7 x weekly - 3 sets - 10 reps  ASSESSMENT:  CLINICAL IMPRESSION: Patient is a 83 y.o. female, who was evaluated today by physical therapy due to new onset of B LE paraesthesia.  She is well known to this clinic.  She has marked changes on MRI with lumbar spinal stenosis.  Currently her paraesthesia and pain are increased/provoked with standing and lumbar extension movement , typical for  this diagnosis. She also has balance deficits, uses a cane and is careful/ guarded with her movements anc activities.  She has multiple postural changes which most likely affect her balance.   Initiated lumbar stretches today, with pt in supine for a neutral spine position.  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 She should benefit from physical thearpy promarily for education and ex progression to assist her with decompression of lumbar region.  OBJECTIVE IMPAIRMENTS: decreased balance, decreased knowledge of condition, difficulty walking, increased edema, postural dysfunction, and pain.   ACTIVITY LIMITATIONS: lifting, standing, squatting, stairs, and locomotion level  PARTICIPATION LIMITATIONS: meal prep, cleaning, community activity, and yard work  PERSONAL FACTORS: Age, Behavior pattern, Fitness, Past/current experiences, Time since onset of injury/illness/exacerbation, and 1-2 comorbidities: h/o L ankle fx, HTN, h/o TIA, lives alone are also affecting patient's functional outcome.   REHAB POTENTIAL: Good  CLINICAL DECISION MAKING: Evolving/moderate complexity  EVALUATION COMPLEXITY: Moderate   GOALS: Goals reviewed with patient? Yes  SHORT TERM GOALS: Target date: 2 weeks, 07/17/24  I HEP Baseline:initiated Goal status: INITIAL  LONG TERM GOALS: Target date: 09/25/24  Modified Oswestry Low Back Pain Disability Questionnaire: 21 / 50 = 42.0 % improve to 32%  Baseline:  Goal status: INITIAL  2.  FGA 11/30 improve to 20/30 Baseline:  Goal status: INITIAL  3.  Patient able to demonstrate/ replicate positioning in sitting, standing, with lifting to reduce compression lumbar region Baseline:  Goal status: INITIAL   PLAN:  PT FREQUENCY: 1x/week  PT DURATION: 12 weeks  PLANNED INTERVENTIONS: 97110-Therapeutic exercises, 97530- Therapeutic activity, [id number]- Neuromuscular  re-education, [phone]- Self Care, [id number]- Manual therapy,  [phone]- Traction (mechanical), Patient/Family education, and Balance training.  PLAN FOR NEXT SESSION: how have exercises worked, did she try the LS corset, advance lumbar flexion ex and hip strengthening.manual techniques as needed   Kriste Broman L Decorey Wahlert, PT DPT, OCS 07/03/2024, 11:18 AM

## 2024-07-10 ENCOUNTER — Other Ambulatory Visit: Payer: Self-pay

## 2024-07-10 ENCOUNTER — Ambulatory Visit

## 2024-07-10 DIAGNOSIS — R293 Abnormal posture: Secondary | ICD-10-CM

## 2024-07-10 DIAGNOSIS — R2681 Unsteadiness on feet: Secondary | ICD-10-CM

## 2024-07-10 DIAGNOSIS — M5459 Other low back pain: Secondary | ICD-10-CM

## 2024-07-10 DIAGNOSIS — M6281 Muscle weakness (generalized): Secondary | ICD-10-CM

## 2024-07-10 NOTE — Therapy (Signed)
 OUTPATIENT PHYSICAL THERAPY THORACOLUMBAR TREATMENT   Patient Name: Kerry [name] MRN: [id number] DOB:[birth year], 83 y.o., female Today's Date: 07/10/2024  END OF SESSION:  PT End of Session - 07/10/24 1118     Visit Number 2    Date for Recertification  09/25/24    Progress Note Due on Visit 10    PT Start Time 1103    PT Stop Time 1145    PT Time Calculation (min) 42 min    Activity Tolerance Patient tolerated treatment well    Behavior During Therapy WFL for tasks assessed/performed           Past Medical History:  Diagnosis Date   Allergy    Anemia    in her 20's   Aortic insufficiency    i   Arthritis    not dx'd- just per pt    Asthma    AS A CHILD   Cancer (HCC)    endometrial cancer with hysterectomy in 1987   Cancer (HCC)    melanoma on face    Cataract    removed both eyes    DCM (dilated cardiomyopathy) (HCC)    EF 50-55%   GERD (gastroesophageal reflux disease)    occ has with diet    History of syncope    HTN (hypertension)    Hyperlipidemia    Mitral regurgitation    mild by echo 03/2023   OSA on CPAP    Pulmonary HTN (HCC)    PASP on echo 03/2023   TIA (transient ischemic attack)    Tricuspid regurgitation    moderate by echo 03/2023   Past Surgical History:  Procedure Laterality Date   ABDOMINAL HYSTERECTOMY     ANKLE FRACTURE SURGERY Left    APPENDECTOMY     CATARACT EXTRACTION, BILATERAL     CHOLECYSTECTOMY     COLONOSCOPY  08/01/2007   Dr Abran    DENTAL SURGERY     MELANOMA EXCISION     face   ovary removal     right leg  fracture surgery      SKIN BIOPSY     pre-cancerous on face   Patient Active Problem List   Diagnosis Date Noted   Dysfunction of left eustachian tube 09/25/2023   Abnormal PFT 06/15/2023   Tricuspid regurgitation    Mitral regurgitation    Pulmonary HTN (HCC)    Prediabetes 03/15/2023   History of dehydration 03/15/2023   Osteopenia 09/14/2022   Low back pain with sciatica 08/19/2022    Gait instability 08/19/2022   B12 deficiency 03/14/2022   Hospital discharge follow-up 09/28/2021   Traumatic ecchymosis of head 09/28/2021   Chronic post-traumatic headache, not intractable 09/28/2021   Subdural hematoma (HCC) 09/24/2021   Encephalopathy acute 09/24/2021   Normocytic anemia 09/14/2021   Seasonal allergic rhinitis due to pollen 03/11/2021   DCM (dilated cardiomyopathy) (HCC)    Hyponatremia 06/21/2019   TIA (transient ischemic attack) 06/20/2019   Aortic insufficiency 08/17/2018   Hyperlipidemia    Right thyroid  nodule 02/07/2013   Right carotid bruit 07/16/2012   Ocular migraine 11/04/2011   Hypercholesterolemia 06/01/2011   Essential hypertension    History of syncope    Asthma     PCP: Berneta Fallow MD  REFERRING PROVIDER: Onita Duos, MD  REFERRING DIAG:  R20.2 (ICD-10-CM) - Paresthesia of both lower extremities  M79.89 (ICD-10-CM) - Swelling of both lower extremities  M79.604,M79.605 (ICD-10-CM) - Pain in both lower extremities  E87.1 (ICD-10-CM) - Low  sodium levels  M54.50,G89.29 (ICD-10-CM) - Chronic midline low back pain without sciatica    Rationale for Evaluation and Treatment: Rehabilitation  THERAPY DIAG:  Unstable gait  Abnormal posture  Other low back pain  Muscle weakness (generalized)  ONSET DATE: 05/22/24  SUBJECTIVE:                                                                                                                                                                                           SUBJECTIVE STATEMENT: 07/10/24: notes still a lot of R LE pain primarily with prolonged standing.  Couldn't find her LS corset so hasn't been able to trial.  These Sx just came on suddenly I dont know why, numbness and tingling radiating from my R lower leg up to my post thigh and hip.  Comes on randomly ,  worse with standing.  I've lost some height this year too  PERTINENT HISTORY:  Referred by neurology due to new onset of lower  leg paraesthesia,  and B lower lumbar/SI region pain.  Had injection which helped with the pain but not the paraesthesias  PAIN:  Are you having pain? Yes: NPRS scale: 0 to 7 Pain location: R lower leg, laterally, extends up to post thigh and post hip/ lower back B Pain description: deep pain , cramping type sensation Aggravating factors: standing prolonged Relieving factors: rest   PRECAUTIONS: Fall  RED FLAGS: None   WEIGHT BEARING RESTRICTIONS: No  FALLS:  Has patient fallen in last 6 months? No  LIVING ENVIRONMENT: Lives with: lives alone Lives in: House/apartment Stairs: has exterior stairs with handrail, also reports lives [place] a hill so fearful of falling when she checks the mail, her neighbor gets the mail a lot for her Has following equipment at home: Single point cane  OCCUPATION: works retail 2 days a week at [employer]  PLOF: Independent  PATIENT GOALS: I would like to be able to stand better  NEXT MD VISIT: sees her rheumatologist  today   OBJECTIVE:  Note: Objective measures were completed at Evaluation unless otherwise noted.  DIAGNOSTIC FINDINGS:  MRI cervical demonstrating: - At C4-5, C5-6: disc bulging and facet hypertrophy with mild spinal stenosis and mild bilateral foraminal stenosis.          Electronically signed by Onita Duos, MD at 06/05/2024  4:44 PM MRI of lumbar spine showed severe degenerative changes L 4-5, posterior subluxation of L5 over L4, with severe canal stenosis, bilateral foraminal narrowing, EMG nerve conduction study demonstrated mild to moderate length-dependent peripheral neuropathy, chronic bilateral lumbosacral radiculopathy, may have a component of mild chronic right cervical radiculopathy PATIENT SURVEYS:  Modified Oswestry Low Back Pain Disability Questionnaire: 21 / 50 = 42.0 %  COGNITION: Overall cognitive status: Within functional limits for tasks assessed     SENSATION: Gross loss of light touch reported B lower legs  and feet but reports intermittent sensory changes  MUSCLE LENGTH: Hamstrings: Right wnl deg; Left wnl deg Debby test: Right nt deg; Left nt deg  POSTURE: multiple postural/ arthritic changes, B genu valgum, R iliac crest elevated with scoliotic lumbar and thoracic, flexed and IR B hips to 15 degrees. B lower legs /ankles edematous , L greater than R  PALPATION: Tender B lower legs over TA, L ankle throughout  LUMBAR ROM:   AROM eval  Flexion Wnl, able to touch floor from standing  Extension 5 degree with p!  Right lateral flexion 50  Left lateral flexion 35 with pain L lat hip  Right rotation   Left rotation    (Blank rows = not tested)  LOWER EXTREMITY ROM:     Passive  Right eval Left eval  Hip flexion wnl wnl  Hip extension -15 -15  Hip abduction    Hip adduction    Hip internal rotation wnl wnl  Hip external rotation wnl wnl  Knee flexion    Knee extension    Ankle dorsiflexion    Ankle plantarflexion    Ankle inversion    Ankle eversion     (Blank rows = not tested)  LOWER EXTREMITY MMT:    MMT Right eval Left eval  Hip flexion 4 4  Hip extension 3+ 3+  Hip abduction    Hip adduction    Hip internal rotation    Hip external rotation    Knee flexion 5 5  Knee extension    Ankle dorsiflexion  5 5  Ankle plantarflexion 3- 3-  Ankle inversion    Ankle eversion     (Blank rows = not tested)  LUMBAR SPECIAL TESTS:  Straight leg raise test: Negative, Slump test: Negative, and FABER test: Negative  FUNCTIONAL TESTS:  Functional gait assessment:  FUNCTIONAL GAIT ASSESSMENT  Date: 07/03/24 Score  GAIT LEVEL SURFACE Instructions: Walk at your normal speed from here to the next mark (6 m) [20 ft]. (1) Moderate impairment-Walks 6 m (20 ft), slow speed, abnormal gait pattern, evidence for imbalance, or deviates 25.4 - 38.1 cm (10 -15 in) outside of the 30.48-cm (12-in) walkway width. Requires more than 7 seconds to ambulate 6 m (20 ft).  2.   CHANGE IN  GAIT SPEED Instructions: Begin walking at your normal pace (for 1.5 m [5 ft]). When I tell you "go," walk as fast as you can (for 1.5 m [5 ft]). When I tell you "slow," walk as slowly as you can (for 1.5 m [5 ft]. (1) Moderate impairment - Makes only minor adjustments to walking speed, or accomplishes a change in speed with significant gait deviations, deviates 25.4 -38.1 cm (10 -15 in) outside the 30.48-cm (12-in) walkway width, or changes speed but loses balance but is able to recover and continue walking.  3.    GAIT WITH HORIZONTAL HEAD TURNS Instructions: Walk from here to the next mark 6 m (20 ft) away. Begin walking at your normal pace. Keep walking straight; after 3 steps, turn your head to the right and keep walking straight while looking to the right. After 3 more steps, turn your head to the left and keep walking straight while looking left. Continue alternating looking right and left. (1) Moderate impairment -  Performs head turns with moderate change in gait velocity, slows down, deviates 25.4 -38.1 cm (10 -15 in) outside 30.48-cm (12-in) walkway width but recovers, can continue to walk.  4.   GAIT WITH VERTICAL HEAD TURNS Instructions: Walk from here to the next mark (6 m [20 ft]). Begin walking at your normal pace. Keep walking straight; after 3 steps, tip your head up and keep walking straight while looking up. After 3 more steps, tip your head down, keep walking straight while looking down. Continue  alternating looking up and down every 3 steps until you have completed 2 repetitions in each direction. (1) Moderate impairment - Performs task with moderate change in gait velocity, slows down, deviates 25.4 -38.1 cm (10 -15 in) outside 30.48-cm (12-in) walkway width but recovers, can continue to walk.  5.  GAIT AND PIVOT TURN Instructions: Begin with walking at your normal pace. When I tell you, "turn and stop," turn as quickly as you can to face the opposite direction and stop. (1) Moderate  impairment - Turns slowly, requires verbal cueing, or requires several small steps to catch balance following turn and stop  6.   STEP OVER OBSTACLE Instructions: Begin walking at your normal speed. When you come to the shoe box, step over it, not around it, and keep walking. (1) Moderate impairment - Is able to step over one shoe box (11.43 cm [4.5 in] total height) but must slow down and adjust steps to clear box safely. May require verbal cueing.  7.   GAIT WITH NARROW BASE OF SUPPORT Instructions: Walk on the floor with arms folded across the chest, feet aligned heel to toe in tandem for a distance of 3.6 m [12 ft]. The number of steps taken in a straight line are counted for a maximum of 10 steps. (0) Severe impairment - Ambulates less than 4 steps heel to toe or cannot perform without assistance.  8.   GAIT WITH EYES CLOSED Instructions: Walk at your normal speed from here to the next mark (6 m [20 ft]) with your eyes closed. (2) Mild impairment - Walks 6 m (20 ft), uses assistive device, slower speed, mild gait deviations, deviates 15.24 -25.4 cm (6 -10 in) outside 30.48-cm (12-in) walkway width. Ambulates 6 m (20 ft) in less than 9 seconds but greater than 7 seconds  9.   AMBULATING BACKWARDS Instructions: Walk backwards until I tell you to stop (2) Mild impairment - Walks 6 m (20 ft), uses assistive device, slower speed, mild gait deviations, deviates 15.24 -25.4 cm (6 -10 in) outside 30.48-cm (12-in) walkway width  10. STEPS Instructions: Walk up these stairs as you would at home (ie, using the rail if necessary). At the top turn around and walk down. (1) Moderate impairment-Two feet to a stair; must use rail.  Total 11/30   Interpretation of scores: Non-Specific Older Adults Cutoff Score: <=22/30 = risk of falls Parkinson's Disease Cutoff score <15/30= fall risk (Hoehn & Yahr 1-4)  Minimally Clinically Important Difference (MCID)  Stroke (acute, subacute, and chronic) = MDC: 4.2  points Vestibular (acute) = MDC: 6 points Community Dwelling Older Adults =  MCID: 4 points Parkinson's Disease  =  MDC: 4.3 points  (Academy of Neurologic Physical Therapy (nd). Functional Gait Assessment. Retrieved from https://www.neuropt.org/docs/default-source/cpgs/core-outcome-measures/function-gait-assessment-pocket-guide-proof9-(2).pdf?sfvrsn=b69f35043_0.)  GAIT: Distance walked: in clinic 19' Assistive device utilized: Single point cane Level of assistance: Modified independence Comments: wide base of support, minimal ankle movement B plants on B forefeet   TREATMENT DATE:  07/10/24  Manual : Sidelying on L side, R knee over bolster, for theragun deep tissue massage R gluteals, piriformis, Tfl Sidelying L for muscle energy to improve lumbar ext, rot. Side bending restriction, 5 bouts, 5 sec holds.    Therex: Supine for LTR Supine R piriformis stretch R foot on L knee  Seated Red t band marching: Seated L trunk rotation stretch, sliding b hands down lateral lower leg Seated hamstring stretch with yoga strap Sit to stand from standard chair without UE use   07/03/24: Eval:  Instructed in supine LTR, and supine hip/piriformis stretch each leg Education regarding anatomy of spine and goal for therex for spinal decompression  Instructed to trial lumbar support/corset  which she has at home to reduce compressive forces lumbar spine.                                                                                                                                   PATIENT EDUCATION:  Education details: POC, goals  Person educated: Patient Education method: Explanation, Demonstration, Tactile cues, Verbal cues, and Handouts Education comprehension: verbalized understanding and returned demonstration  HOME EXERCISE PROGRAM: Access Code: [email or web address] URL: [email or web address] Date: 07/10/2024 Prepared by: Jermiya Reichl  Exercises - Lower Trunk Rotations  - 1 x daily  - 7 x weekly - 3 sets - 10 reps - Supine Piriformis Stretch with Foot on Ground  - 1 x daily - 7 x weekly - 3 sets - 10 reps - Seated Hamstring Stretch with Strap  - 1 x daily - 7 x weekly - 3 sets - 10 reps - Seated Lumbar Flexion Stretch  - 1 x daily - 7 x weekly - 3 sets - 10 reps - Seated March with Resistance  - 1 x daily - 7 x weekly - 3 sets - 10 reps Access Code: [email or web address] URL: [email or web address] Date: 07/03/2024 Prepared by: Samuel Rittenhouse  Exercises - Lower Trunk Rotations  - 1 x daily - 7 x weekly - 3 sets - 10 reps - Supine Piriformis Stretch with Foot on Ground  - 1 x daily - 7 x weekly - 3 sets - 10 reps  ASSESSMENT:  CLINICAL IMPRESSION:07/10/24: returned today for second PT session, combined manual techniques and therex designed to improve lumbar and R hip mobility .  She tolerated well, noted improved R LE sx at end of session.  Will continue to address as appropriate.    Eval:Patient is a 83 y.o. female, who was evaluated today by physical therapy due to new onset of B LE paraesthesia.  She is well known to this clinic.  She has marked changes on MRI with lumbar spinal stenosis.  Currently her paraesthesia and pain are increased/provoked with standing and lumbar extension movement , typical for this diagnosis. She also has balance deficits, uses a cane and is careful/ guarded with her movements anc activities.  She has multiple postural changes which most likely affect her  balance.   Initiated lumbar stretches today, with pt in supine for a neutral spine position.  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 She should benefit from physical thearpy promarily for education and ex progression to assist her with decompression of lumbar region.  OBJECTIVE IMPAIRMENTS: decreased balance, decreased knowledge of condition, difficulty walking, increased edema, postural dysfunction, and pain.   ACTIVITY LIMITATIONS: lifting, standing,  squatting, stairs, and locomotion level  PARTICIPATION LIMITATIONS: meal prep, cleaning, community activity, and yard work  PERSONAL FACTORS: Age, Behavior pattern, Fitness, Past/current experiences, Time since onset of injury/illness/exacerbation, and 1-2 comorbidities: h/o L ankle fx, HTN, h/o TIA, lives alone are also affecting patient's functional outcome.   REHAB POTENTIAL: Good  CLINICAL DECISION MAKING: Evolving/moderate complexity  EVALUATION COMPLEXITY: Moderate   GOALS: Goals reviewed with patient? Yes  SHORT TERM GOALS: Target date: 2 weeks, 07/17/24  I HEP Baseline:initiated Goal status: INITIAL  LONG TERM GOALS: Target date: 09/25/24  Modified Oswestry Low Back Pain Disability Questionnaire: 21 / 50 = 42.0 % improve to 32%  Baseline:  Goal status: INITIAL  2.  FGA 11/30 improve to 20/30 Baseline:  Goal status: INITIAL  3.  Patient able to demonstrate/ replicate positioning in sitting, standing, with lifting to reduce compression lumbar region Baseline:  Goal status: INITIAL   PLAN:  PT FREQUENCY: 1x/week  PT DURATION: 12 weeks  PLANNED INTERVENTIONS: 97110-Therapeutic exercises, 97530- Therapeutic activity, [id number]- Neuromuscular re-education, 97535- Self Care, 02859- Manual therapy, 02987- Traction (mechanical), Patient/Family education, and Balance training.  PLAN FOR NEXT SESSION: how have exercises worked, did she try the LS corset, advance lumbar flexion ex and hip strengthening.manual techniques as needed   Aedyn Mckeon L Loring Liskey, PT DPT, OCS 07/10/2024, 11:20 AM

## 2024-07-17 ENCOUNTER — Ambulatory Visit

## 2024-07-24 ENCOUNTER — Ambulatory Visit

## 2024-07-25 ENCOUNTER — Ambulatory Visit: Payer: Self-pay | Admitting: Family Medicine

## 2024-07-25 ENCOUNTER — Ambulatory Visit (INDEPENDENT_AMBULATORY_CARE_PROVIDER_SITE_OTHER): Admitting: Family Medicine

## 2024-07-25 ENCOUNTER — Encounter: Payer: Self-pay | Admitting: Family Medicine

## 2024-07-25 VITALS — BP 142/74 | HR 80 | Temp 98.2°F | Ht 62.0 in | Wt 189.0 lb

## 2024-07-25 DIAGNOSIS — I872 Venous insufficiency (chronic) (peripheral): Secondary | ICD-10-CM | POA: Diagnosis not present

## 2024-07-25 DIAGNOSIS — J301 Allergic rhinitis due to pollen: Secondary | ICD-10-CM | POA: Diagnosis not present

## 2024-07-25 DIAGNOSIS — I10 Essential (primary) hypertension: Secondary | ICD-10-CM | POA: Diagnosis not present

## 2024-07-25 DIAGNOSIS — E7800 Pure hypercholesterolemia, unspecified: Secondary | ICD-10-CM | POA: Diagnosis not present

## 2024-07-25 DIAGNOSIS — R7303 Prediabetes: Secondary | ICD-10-CM

## 2024-07-25 LAB — BASIC METABOLIC PANEL WITH GFR
BUN: 16 mg/dL (ref 6–23)
CO2: 26 meq/L (ref 19–32)
Calcium: 9.4 mg/dL (ref 8.4–10.5)
Chloride: 105 meq/L (ref 96–112)
Creatinine, Ser: 0.82 mg/dL (ref 0.40–1.20)
GFR: 66.5 mL/min (ref >60.00)
Glucose, Bld: 91 mg/dL (ref 70–99)
Potassium: 4.2 meq/L (ref 3.5–5.1)
Sodium: 140 meq/L (ref 135–145)

## 2024-07-25 LAB — HEMOGLOBIN A1C: Hgb A1c MFr Bld: 5.9 % (ref 4.6–6.5)

## 2024-07-25 LAB — LDL CHOLESTEROL, DIRECT: Direct LDL: 66 mg/dL

## 2024-07-25 NOTE — Progress Notes (Signed)
 Established Patient Office Visit   Subjective:  Patient ID: Kerry  LAURIANNE [name], female    DOB: 05/16/1941  Age: 83 y.o. MRN: [id number]  Chief Complaint  Patient presents with   Medical Management of Chronic Issues    3 month follow up. Pt is not fasting. Pt states edema of right leg is slightly improving.     HPI Encounter Diagnoses  Name Primary?   Prediabetes Yes   Seasonal allergic rhinitis due to pollen    Venous insufficiency    Essential hypertension    Hypercholesterolemia    For follow-up of above.  Swelling in the lower extremities is improved with the compression stockings but remains.  Sodium intake has increased.  Continues Xyzal and Flonase  for all allergies.  She feels some pressure in her left frontal sinus area.  Continues lisinopril  and carvedilol  for hypertension.  Continues atorvastatin  for elevated cholesterol.  She lives alone but family is close by.   Review of Systems  Constitutional: Negative.   HENT: Negative.    Eyes:  Negative for blurred vision, discharge and redness.  Respiratory: Negative.    Cardiovascular: Negative.   Gastrointestinal:  Negative for abdominal pain.  Genitourinary: Negative.   Musculoskeletal: Negative.  Negative for myalgias.  Skin:  Negative for rash.  Neurological:  Negative for tingling, loss of consciousness and weakness.  Endo/Heme/Allergies:  Negative for polydipsia.     Current Outpatient Medications:    acetaminophen  (TYLENOL ) 500 MG tablet, Take 500 mg by mouth every 6 (six) hours as needed for moderate pain or headache. , Disp: , Rfl:    Ascorbic Acid (VITAMIN C PO), Take 1 tablet by mouth daily., Disp: , Rfl:    aspirin  81 MG chewable tablet, Chew 81 mg by mouth every other day., Disp: , Rfl:    Cholecalciferol (VITAMIN D3) 2000 units capsule, Take 2,000 Units by mouth daily., Disp: , Rfl:    fexofenadine (ALLEGRA) 180 MG tablet, Take 180 mg by mouth every morning., Disp: , Rfl:    Iron , Ferrous Sulfate , 325 (65  Fe) MG TABS, Take 325 mg by mouth daily., Disp: 90 tablet, Rfl: 3   Misc Natural Products (NEURIVA PO), Take 1 tablet by mouth daily., Disp: , Rfl:    Multiple Vitamin (MULTIVITAMIN WITH MINERALS) TABS tablet, Take 1 tablet by mouth daily., Disp: , Rfl:    pantoprazole  (PROTONIX ) 40 MG tablet, Take 1 tablet (40 mg total) by mouth daily., Disp: 30 tablet, Rfl: 11   alendronate  (FOSAMAX ) 70 MG tablet, TAKE 1 TABLET(70 MG) BY MOUTH 1 TIME A WEEK WITH A FULL GLASS OF WATER AND ON AN EMPTY STOMACH, Disp: 12 tablet, Rfl: 0   atorvastatin  (LIPITOR) 20 MG tablet, TAKE 1 TABLET(20 MG) BY MOUTH DAILY, Disp: 90 tablet, Rfl: 2   carvedilol  (COREG ) 12.5 MG tablet, TAKE 1 TABLET(12.5 MG) BY MOUTH TWICE DAILY, Disp: 180 tablet, Rfl: 1   Coenzyme Q10-Vitamin E (QUNOL ULTRA COQ10 PO), Take 1 capsule by mouth daily., Disp: , Rfl:    Cyanocobalamin  500 MCG CHEW, Take one daily, Disp: 90 tablet, Rfl: 1   fluorouracil (EFUDEX) 5 % cream, 1 Application as needed., Disp: , Rfl:    fluticasone  (FLONASE ) 50 MCG/ACT nasal spray, SHAKE LIQUID AND USE 2 SPRAYS IN EACH NOSTRIL DAILY, Disp: 48 g, Rfl: 1   fluticasone  furoate-vilanterol (BREO ELLIPTA ) 100-25 MCG/ACT AEPB, Inhale 1 puff into the lungs daily., Disp: 28 each, Rfl: 3   lisinopril  (ZESTRIL ) 20 MG tablet, TAKE 1 TABLET(20 MG) BY  MOUTH DAILY, Disp: 90 tablet, Rfl: 3   Polyethyl Glycol-Propyl Glycol (SYSTANE) 0.4-0.3 % SOLN, Place 1 drop into both eyes daily as needed (dry eyes)., Disp: , Rfl:   Current Facility-Administered Medications:    fluticasone  furoate-vilanterol (BREO ELLIPTA ) 100-25 MCG/ACT 1 puff, 1 puff, Inhalation, Daily,    Objective:     BP (!) 142/74 (BP Location: Left Arm, Patient Position: Sitting, Cuff Size: Large)   Pulse 80   Temp 98.2 F (36.8 C) (Oral)   Ht 5' 2 (1.575 m)   Wt 189 lb (85.7 kg)   SpO2 98%   BMI 34.57 kg/m    Physical Exam Constitutional:      General: She is not in acute distress.    Appearance: Normal appearance.  She is not ill-appearing, toxic-appearing or diaphoretic.  HENT:     Head: Normocephalic and atraumatic.     Right Ear: External ear normal.     Left Ear: External ear normal.     Mouth/Throat:     Mouth: Mucous membranes are moist.     Pharynx: Oropharynx is clear. No oropharyngeal exudate or posterior oropharyngeal erythema.  Eyes:     General: No scleral icterus.       Right eye: No discharge.        Left eye: No discharge.     Extraocular Movements: Extraocular movements intact.     Conjunctiva/sclera: Conjunctivae normal.     Pupils: Pupils are equal, round, and reactive to light.  Cardiovascular:     Rate and Rhythm: Normal rate and regular rhythm.  Pulmonary:     Effort: Pulmonary effort is normal. No respiratory distress.     Breath sounds: Normal breath sounds. No wheezing or rales.  Abdominal:     General: Bowel sounds are normal.  Musculoskeletal:     Cervical back: No rigidity or tenderness.       Legs:  Skin:    General: Skin is warm and dry.  Neurological:     Mental Status: She is alert and oriented to person, place, and time.  Psychiatric:        Mood and Affect: Mood normal.        Behavior: Behavior normal.      No results found for any visits on 07/25/24.    The ASCVD Risk score (Arnett DK, et al., 2019) failed to calculate for the following reasons:   The 2019 ASCVD risk score is only valid for ages 44 to 42   Risk score cannot be calculated because patient has a medical history suggesting prior/existing ASCVD    Assessment & Plan:   Prediabetes -     Basic metabolic panel with GFR -     Hemoglobin A1c  Seasonal allergic rhinitis due to pollen  Venous insufficiency  Essential hypertension -     Basic metabolic panel with GFR  Hypercholesterolemia -     LDL cholesterol, direct    Return in about 3 months (around 10/25/2024).  Continue all medications as above.  Asked her to decrease sodium in her diet.  Information given on managing  hypertension.  [name], MD

## 2024-07-26 ENCOUNTER — Encounter: Payer: Self-pay | Admitting: Physical Therapy

## 2024-07-26 ENCOUNTER — Ambulatory Visit: Attending: Family Medicine | Admitting: Physical Therapy

## 2024-07-26 DIAGNOSIS — R2681 Unsteadiness on feet: Secondary | ICD-10-CM | POA: Diagnosis present

## 2024-07-26 DIAGNOSIS — M51362 Other intervertebral disc degeneration, lumbar region with discogenic back pain and lower extremity pain: Secondary | ICD-10-CM | POA: Diagnosis present

## 2024-07-26 DIAGNOSIS — R293 Abnormal posture: Secondary | ICD-10-CM | POA: Diagnosis present

## 2024-07-26 DIAGNOSIS — M5459 Other low back pain: Secondary | ICD-10-CM | POA: Diagnosis present

## 2024-07-26 DIAGNOSIS — M6281 Muscle weakness (generalized): Secondary | ICD-10-CM | POA: Diagnosis present

## 2024-07-26 NOTE — Therapy (Signed)
 OUTPATIENT PHYSICAL THERAPY THORACOLUMBAR TREATMENT   Patient Name: Kerry  KIT [name] MRN: [id number] DOB:[birth year], 83 y.o., female Today's Date: 07/26/2024  END OF SESSION:  PT End of Session - 07/26/24 0803     Visit Number 3    Date for Recertification  09/25/24    PT Start Time 0800    PT Stop Time 0845    PT Time Calculation (min) 45 min    Activity Tolerance Patient tolerated treatment well    Behavior During Therapy [facility] for tasks assessed/performed           Past Medical History:  Diagnosis Date   Allergy    Anemia    in her 20's   Aortic insufficiency    i   Arthritis    not dx'd- just per pt    Asthma    AS A CHILD   Cancer (HCC)    endometrial cancer with hysterectomy in 1987   Cancer (HCC)    melanoma on face    Cataract    removed both eyes    DCM (dilated cardiomyopathy) (HCC)    EF 50-55%   GERD (gastroesophageal reflux disease)    occ has with diet    History of syncope    HTN (hypertension)    Hyperlipidemia    Mitral regurgitation    mild by echo 03/2023   OSA on CPAP    Pulmonary HTN (HCC)    PASP on echo 03/2023   TIA (transient ischemic attack)    Tricuspid regurgitation    moderate by echo 03/2023   Past Surgical History:  Procedure Laterality Date   ABDOMINAL HYSTERECTOMY     ANKLE FRACTURE SURGERY Left    APPENDECTOMY     CATARACT EXTRACTION, BILATERAL     CHOLECYSTECTOMY     COLONOSCOPY  08/01/2007   Dr Abran    DENTAL SURGERY     MELANOMA EXCISION     face   ovary removal     right leg  fracture surgery      SKIN BIOPSY     pre-cancerous on face   Patient Active Problem List   Diagnosis Date Noted   Dysfunction of left eustachian tube 09/25/2023   Abnormal PFT 06/15/2023   Tricuspid regurgitation    Mitral regurgitation    Pulmonary HTN (HCC)    Prediabetes 03/15/2023   History of dehydration 03/15/2023   Osteopenia 09/14/2022   Low back pain with sciatica 08/19/2022   Gait instability 08/19/2022   B12  deficiency 03/14/2022   Hospital discharge follow-up 09/28/2021   Traumatic ecchymosis of head 09/28/2021   Chronic post-traumatic headache, not intractable 09/28/2021   Subdural hematoma (HCC) 09/24/2021   Encephalopathy acute 09/24/2021   Normocytic anemia 09/14/2021   Seasonal allergic rhinitis due to pollen 03/11/2021   DCM (dilated cardiomyopathy) (HCC)    Hyponatremia 06/21/2019   TIA (transient ischemic attack) 06/20/2019   Aortic insufficiency 08/17/2018   Hyperlipidemia    Right thyroid  nodule 02/07/2013   Right carotid bruit 07/16/2012   Ocular migraine 11/04/2011   Hypercholesterolemia 06/01/2011   Essential hypertension    History of syncope    Asthma     PCP: Berneta Fallow MD  REFERRING PROVIDER: Onita Duos, MD  REFERRING DIAG:  R20.2 (ICD-10-CM) - Paresthesia of both lower extremities  M79.89 (ICD-10-CM) - Swelling of both lower extremities  M79.604,M79.605 (ICD-10-CM) - Pain in both lower extremities  E87.1 (ICD-10-CM) - Low sodium levels  M54.50,G89.29 (ICD-10-CM) - Chronic midline low  back pain without sciatica    Rationale for Evaluation and Treatment: Rehabilitation  THERAPY DIAG:  Unstable gait  Abnormal posture  Other low back pain  Muscle weakness (generalized)  Degeneration of intervertebral disc of lumbar region with discogenic back pain and lower extremity pain  ONSET DATE: 05/22/24  SUBJECTIVE:                                                                                                                                                                                           SUBJECTIVE STATEMENT:  I feel like my right posterior thigh when I sit is balled up   Couldn't find her LS corset so hasn't been able to trial.  These Sx just came on suddenly I dont know why, numbness and tingling radiating from my R lower leg up to my post thigh and hip.  Comes on randomly ,  worse with standing.  I've lost some height this year  too  PERTINENT HISTORY:  Referred by neurology due to new onset of lower leg paraesthesia,  and B lower lumbar/SI region pain.  Had injection which helped with the pain but not the paraesthesias  PAIN:  Are you having pain? Yes: NPRS scale: 0 to 7 Pain location: R lower leg, laterally, extends up to post thigh and post hip/ lower back B Pain description: deep pain , cramping type sensation Aggravating factors: standing prolonged Relieving factors: rest   PRECAUTIONS: Fall  RED FLAGS: None   WEIGHT BEARING RESTRICTIONS: No  FALLS:  Has patient fallen in last 6 months? No  LIVING ENVIRONMENT: Lives with: lives alone Lives in: House/apartment Stairs: has exterior stairs with handrail, also reports lives [place] a hill so fearful of falling when she checks the mail, her neighbor gets the mail a lot for her Has following equipment at home: Single point cane  OCCUPATION: works retail 2 days a week at [employer]  PLOF: Independent  PATIENT GOALS: I would like to be able to stand better  NEXT MD VISIT: sees her rheumatologist  today   OBJECTIVE:  Note: Objective measures were completed at Evaluation unless otherwise noted.  DIAGNOSTIC FINDINGS:  MRI cervical demonstrating: - At C4-5, C5-6: disc bulging and facet hypertrophy with mild spinal stenosis and mild bilateral foraminal stenosis.          Electronically signed by Onita Duos, MD at 06/05/2024  4:44 PM MRI of lumbar spine showed severe degenerative changes L 4-5, posterior subluxation of L5 over L4, with severe canal stenosis, bilateral foraminal narrowing, EMG nerve conduction study demonstrated mild to moderate length-dependent peripheral neuropathy, chronic bilateral lumbosacral radiculopathy, may have a component  of mild chronic right cervical radiculopathy PATIENT SURVEYS:  Modified Oswestry Low Back Pain Disability Questionnaire: 21 / 50 = 42.0 %  COGNITION: Overall cognitive status: Within functional limits for  tasks assessed     SENSATION: Gross loss of light touch reported B lower legs and feet but reports intermittent sensory changes  MUSCLE LENGTH: Hamstrings: Right wnl deg; Left wnl deg Debby test: Right nt deg; Left nt deg  POSTURE: multiple postural/ arthritic changes, B genu valgum, R iliac crest elevated with scoliotic lumbar and thoracic, flexed and IR B hips to 15 degrees. B lower legs /ankles edematous , L greater than R  PALPATION: Tender B lower legs over TA, L ankle throughout  LUMBAR ROM:   AROM eval  Flexion Wnl, able to touch floor from standing  Extension 5 degree with p!  Right lateral flexion 50  Left lateral flexion 35 with pain L lat hip  Right rotation   Left rotation    (Blank rows = not tested)  LOWER EXTREMITY ROM:     Passive  Right eval Left eval  Hip flexion wnl wnl  Hip extension -15 -15  Hip abduction    Hip adduction    Hip internal rotation wnl wnl  Hip external rotation wnl wnl  Knee flexion    Knee extension    Ankle dorsiflexion    Ankle plantarflexion    Ankle inversion    Ankle eversion     (Blank rows = not tested)  LOWER EXTREMITY MMT:    MMT Right eval Left eval  Hip flexion 4 4  Hip extension 3+ 3+  Hip abduction    Hip adduction    Hip internal rotation    Hip external rotation    Knee flexion 5 5  Knee extension    Ankle dorsiflexion  5 5  Ankle plantarflexion 3- 3-  Ankle inversion    Ankle eversion     (Blank rows = not tested)  LUMBAR SPECIAL TESTS:  Straight leg raise test: Negative, Slump test: Negative, and FABER test: Negative  FUNCTIONAL TESTS:  Functional gait assessment:  FUNCTIONAL GAIT ASSESSMENT  Date: 07/03/24 Score  GAIT LEVEL SURFACE Instructions: Walk at your normal speed from here to the next mark (6 m) [20 ft]. (1) Moderate impairment-Walks 6 m (20 ft), slow speed, abnormal gait pattern, evidence for imbalance, or deviates 25.4 - 38.1 cm (10 -15 in) outside of the 30.48-cm (12-in) walkway  width. Requires more than 7 seconds to ambulate 6 m (20 ft).  2.   CHANGE IN GAIT SPEED Instructions: Begin walking at your normal pace (for 1.5 m [5 ft]). When I tell you "go," walk as fast as you can (for 1.5 m [5 ft]). When I tell you "slow," walk as slowly as you can (for 1.5 m [5 ft]. (1) Moderate impairment - Makes only minor adjustments to walking speed, or accomplishes a change in speed with significant gait deviations, deviates 25.4 -38.1 cm (10 -15 in) outside the 30.48-cm (12-in) walkway width, or changes speed but loses balance but is able to recover and continue walking.  3.    GAIT WITH HORIZONTAL HEAD TURNS Instructions: Walk from here to the next mark 6 m (20 ft) away. Begin walking at your normal pace. Keep walking straight; after 3 steps, turn your head to the right and keep walking straight while looking to the right. After 3 more steps, turn your head to the left and keep walking straight while looking left. Continue  alternating looking right and left. (1) Moderate impairment - Performs head turns with moderate change in gait velocity, slows down, deviates 25.4 -38.1 cm (10 -15 in) outside 30.48-cm (12-in) walkway width but recovers, can continue to walk.  4.   GAIT WITH VERTICAL HEAD TURNS Instructions: Walk from here to the next mark (6 m [20 ft]). Begin walking at your normal pace. Keep walking straight; after 3 steps, tip your head up and keep walking straight while looking up. After 3 more steps, tip your head down, keep walking straight while looking down. Continue  alternating looking up and down every 3 steps until you have completed 2 repetitions in each direction. (1) Moderate impairment - Performs task with moderate change in gait velocity, slows down, deviates 25.4 -38.1 cm (10 -15 in) outside 30.48-cm (12-in) walkway width but recovers, can continue to walk.  5.  GAIT AND PIVOT TURN Instructions: Begin with walking at your normal pace. When I tell you, "turn and stop," turn  as quickly as you can to face the opposite direction and stop. (1) Moderate impairment - Turns slowly, requires verbal cueing, or requires several small steps to catch balance following turn and stop  6.   STEP OVER OBSTACLE Instructions: Begin walking at your normal speed. When you come to the shoe box, step over it, not around it, and keep walking. (1) Moderate impairment - Is able to step over one shoe box (11.43 cm [4.5 in] total height) but must slow down and adjust steps to clear box safely. May require verbal cueing.  7.   GAIT WITH NARROW BASE OF SUPPORT Instructions: Walk on the floor with arms folded across the chest, feet aligned heel to toe in tandem for a distance of 3.6 m [12 ft]. The number of steps taken in a straight line are counted for a maximum of 10 steps. (0) Severe impairment - Ambulates less than 4 steps heel to toe or cannot perform without assistance.  8.   GAIT WITH EYES CLOSED Instructions: Walk at your normal speed from here to the next mark (6 m [20 ft]) with your eyes closed. (2) Mild impairment - Walks 6 m (20 ft), uses assistive device, slower speed, mild gait deviations, deviates 15.24 -25.4 cm (6 -10 in) outside 30.48-cm (12-in) walkway width. Ambulates 6 m (20 ft) in less than 9 seconds but greater than 7 seconds  9.   AMBULATING BACKWARDS Instructions: Walk backwards until I tell you to stop (2) Mild impairment - Walks 6 m (20 ft), uses assistive device, slower speed, mild gait deviations, deviates 15.24 -25.4 cm (6 -10 in) outside 30.48-cm (12-in) walkway width  10. STEPS Instructions: Walk up these stairs as you would at home (ie, using the rail if necessary). At the top turn around and walk down. (1) Moderate impairment-Two feet to a stair; must use rail.  Total 11/30   Interpretation of scores: Non-Specific Older Adults Cutoff Score: <=22/30 = risk of falls Parkinson's Disease Cutoff score <15/30= fall risk (Hoehn & Yahr 1-4)  Minimally Clinically Important  Difference (MCID)  Stroke (acute, subacute, and chronic) = MDC: 4.2 points Vestibular (acute) = MDC: 6 points Community Dwelling Older Adults =  MCID: 4 points Parkinson's Disease  =  MDC: 4.3 points  (Academy of Neurologic Physical Therapy (nd). Functional Gait Assessment. Retrieved from https://www.neuropt.org/docs/default-source/cpgs/core-outcome-measures/function-gait-assessment-pocket-guide-proof9-(2).pdf?sfvrsn=b20f35043_0.)  GAIT: Distance walked: in clinic 53' Assistive device utilized: Single point cane Level of assistance: Modified independence Comments: wide base of support, minimal ankle movement B plants  on B forefeet   TREATMENT DATE:  07/26/24 Nustep Level 5 x 5 minutes More square 2 blocks step overs all directions used SPC and CGA On airex reaching and ball toss CGA Discussion and education on fall risk and safety with walking, trying to avoid multitasking and looking to the side while walking HHA direction changes In pbars marching and hip abduction Feet on ball K2C, rotation, small bridge, isometric abs Passive stretch HS and piriformis Manual sheet traction  07/10/24 Manual : Sidelying on L side, R knee over bolster, for theragun deep tissue massage R gluteals, piriformis, Tfl Sidelying L for muscle energy to improve lumbar ext, rot. Side bending restriction, 5 bouts, 5 sec holds.    Therex: Supine for LTR Supine R piriformis stretch R foot on L knee  Seated Red t band marching: Seated L trunk rotation stretch, sliding b hands down lateral lower leg Seated hamstring stretch with yoga strap Sit to stand from standard chair without UE use   07/03/24: Eval:  Instructed in supine LTR, and supine hip/piriformis stretch each leg Education regarding anatomy of spine and goal for therex for spinal decompression  Instructed to trial lumbar support/corset  which she has at home to reduce compressive forces lumbar spine.                                                                                                                                    PATIENT EDUCATION:  Education details: POC, goals  Person educated: Patient Education method: Explanation, Demonstration, Tactile cues, Verbal cues, and Handouts Education comprehension: verbalized understanding and returned demonstration  HOME EXERCISE PROGRAM: Access Code: [email or web address] URL: [email or web address] Date: 07/10/2024 Prepared by: Amy Speaks  Exercises - Lower Trunk Rotations  - 1 x daily - 7 x weekly - 3 sets - 10 reps - Supine Piriformis Stretch with Foot on Ground  - 1 x daily - 7 x weekly - 3 sets - 10 reps - Seated Hamstring Stretch with Strap  - 1 x daily - 7 x weekly - 3 sets - 10 reps - Seated Lumbar Flexion Stretch  - 1 x daily - 7 x weekly - 3 sets - 10 reps - Seated March with Resistance  - 1 x daily - 7 x weekly - 3 sets - 10 reps Access Code: [email or web address] URL: [email or web address] Date: 07/03/2024 Prepared by: Amy Speaks  Exercises - Lower Trunk Rotations  - 1 x daily - 7 x weekly - 3 sets - 10 reps - Supine Piriformis Stretch with Foot on Ground  - 1 x daily - 7 x weekly - 3 sets - 10 reps  ASSESSMENT:  CLINICAL IMPRESSION: Patient reports difficulty with balance and difficulty with right posterior thigh pain and cramping.  We did some higher level balance some easy exercises and then finished with passive LE stretches and manual sheet traction,  With the balance activities she does require CGA due to poor  balance Eval:Patient is a 82 y.o. female, who was evaluated today by physical therapy due to new onset of B LE paraesthesia.  She is well known to this clinic.  She has marked changes on MRI with lumbar spinal stenosis.  Currently her paraesthesia and pain are increased/provoked with standing and lumbar extension movement , typical for this diagnosis. She also has balance deficits, uses a cane and is careful/ guarded with her movements anc  activities.  She has multiple postural changes which most likely affect her balance.   Initiated lumbar stretches today, with pt in supine for a neutral spine position.  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 She should benefit from physical thearpy promarily for education and ex progression to assist her with decompression of lumbar region.  OBJECTIVE IMPAIRMENTS: decreased balance, decreased knowledge of condition, difficulty walking, increased edema, postural dysfunction, and pain.   ACTIVITY LIMITATIONS: lifting, standing, squatting, stairs, and locomotion level  PARTICIPATION LIMITATIONS: meal prep, cleaning, community activity, and yard work  PERSONAL FACTORS: Age, Behavior pattern, Fitness, Past/current experiences, Time since onset of injury/illness/exacerbation, and 1-2 comorbidities: h/o L ankle fx, HTN, h/o TIA, lives alone are also affecting patient's functional outcome.   REHAB POTENTIAL: Good  CLINICAL DECISION MAKING: Evolving/moderate complexity  EVALUATION COMPLEXITY: Moderate   GOALS: Goals reviewed with patient? Yes  SHORT TERM GOALS: Target date: 2 weeks, 07/17/24  I HEP Baseline:initiated Goal status: met 07/26/24  LONG TERM GOALS: Target date: 09/25/24  Modified Oswestry Low Back Pain Disability Questionnaire: 21 / 50 = 42.0 % improve to 32%  Baseline:  Goal status: INITIAL  2.  FGA 11/30 improve to 20/30 Baseline:  Goal status: INITIAL  3.  Patient able to demonstrate/ replicate positioning in sitting, standing, with lifting to reduce compression lumbar region Baseline:  Goal status: INITIAL   PLAN:  PT FREQUENCY: 1x/week  PT DURATION: 12 weeks  PLANNED INTERVENTIONS: 97110-Therapeutic exercises, 97530- Therapeutic activity, [id number]- Neuromuscular re-education, 97535- Self Care, [id number]- Manual therapy, 02987- Traction (mechanical), Patient/Family education, and Balance training.  PLAN FOR NEXT SESSION:  how have exercises worked, did she try the LS corset, advance lumbar flexion ex and hip strengthening.manual techniques as needed   OBADIAH OZELL ORN, PT  07/26/2024, 8:03 AM

## 2024-07-29 ENCOUNTER — Ambulatory Visit

## 2024-07-29 NOTE — Therapy (Incomplete)
 OUTPATIENT PHYSICAL THERAPY THORACOLUMBAR TREATMENT   Patient Name: Kerry  SHERICKA [name] MRN: [id number] DOB:[birth year], 83 y.o., female Today's Date: 07/29/2024  END OF SESSION:     Past Medical History:  Diagnosis Date   Allergy    Anemia    in her 20's   Aortic insufficiency    i   Arthritis    not dx'd- just per pt    Asthma    AS A CHILD   Cancer (HCC)    endometrial cancer with hysterectomy in 1987   Cancer (HCC)    melanoma on face    Cataract    removed both eyes    DCM (dilated cardiomyopathy) (HCC)    EF 50-55%   GERD (gastroesophageal reflux disease)    occ has with diet    History of syncope    HTN (hypertension)    Hyperlipidemia    Mitral regurgitation    mild by echo 03/2023   OSA on CPAP    Pulmonary HTN (HCC)    PASP on echo 03/2023   TIA (transient ischemic attack)    Tricuspid regurgitation    moderate by echo 03/2023   Past Surgical History:  Procedure Laterality Date   ABDOMINAL HYSTERECTOMY     ANKLE FRACTURE SURGERY Left    APPENDECTOMY     CATARACT EXTRACTION, BILATERAL     CHOLECYSTECTOMY     COLONOSCOPY  08/01/2007   Dr Abran    DENTAL SURGERY     MELANOMA EXCISION     face   ovary removal     right leg  fracture surgery      SKIN BIOPSY     pre-cancerous on face   Patient Active Problem List   Diagnosis Date Noted   Dysfunction of left eustachian tube 09/25/2023   Abnormal PFT 06/15/2023   Tricuspid regurgitation    Mitral regurgitation    Pulmonary HTN (HCC)    Prediabetes 03/15/2023   History of dehydration 03/15/2023   Osteopenia 09/14/2022   Low back pain with sciatica 08/19/2022   Gait instability 08/19/2022   B12 deficiency 03/14/2022   Hospital discharge follow-up 09/28/2021   Traumatic ecchymosis of head 09/28/2021   Chronic post-traumatic headache, not intractable 09/28/2021   Subdural hematoma (HCC) 09/24/2021   Encephalopathy acute 09/24/2021   Normocytic anemia 09/14/2021   Seasonal allergic  rhinitis due to pollen 03/11/2021   DCM (dilated cardiomyopathy) (HCC)    Hyponatremia 06/21/2019   TIA (transient ischemic attack) 06/20/2019   Aortic insufficiency 08/17/2018   Hyperlipidemia    Right thyroid  nodule 02/07/2013   Right carotid bruit 07/16/2012   Ocular migraine 11/04/2011   Hypercholesterolemia 06/01/2011   Essential hypertension    History of syncope    Asthma     PCP: Berneta Fallow MD  REFERRING PROVIDER: Onita Duos, MD  REFERRING DIAG:  R20.2 (ICD-10-CM) - Paresthesia of both lower extremities  M79.89 (ICD-10-CM) - Swelling of both lower extremities  M79.604,M79.605 (ICD-10-CM) - Pain in both lower extremities  E87.1 (ICD-10-CM) - Low sodium levels  M54.50,G89.29 (ICD-10-CM) - Chronic midline low back pain without sciatica    Rationale for Evaluation and Treatment: Rehabilitation  THERAPY DIAG:  No diagnosis found.  ONSET DATE: 05/22/24  SUBJECTIVE:  SUBJECTIVE STATEMENT:  I feel like my right posterior thigh when I sit is balled up   Couldn't find her LS corset so hasn't been able to trial.  These Sx just came on suddenly I dont know why, numbness and tingling radiating from my R lower leg up to my post thigh and hip.  Comes on randomly ,  worse with standing.  I've lost some height this year too  PERTINENT HISTORY:  Referred by neurology due to new onset of lower leg paraesthesia,  and B lower lumbar/SI region pain.  Had injection which helped with the pain but not the paraesthesias  PAIN:  Are you having pain? Yes: NPRS scale: 0 to 7 Pain location: R lower leg, laterally, extends up to post thigh and post hip/ lower back B Pain description: deep pain , cramping type sensation Aggravating factors: standing prolonged Relieving factors: rest   PRECAUTIONS:  Fall  RED FLAGS: None   WEIGHT BEARING RESTRICTIONS: No  FALLS:  Has patient fallen in last 6 months? No  LIVING ENVIRONMENT: Lives with: lives alone Lives in: House/apartment Stairs: has exterior stairs with handrail, also reports lives [place] a hill so fearful of falling when she checks the mail, her neighbor gets the mail a lot for her Has following equipment at home: Single point cane  OCCUPATION: works retail 2 days a week at [employer]  PLOF: Independent  PATIENT GOALS: I would like to be able to stand better  NEXT MD VISIT: sees her rheumatologist  today   OBJECTIVE:  Note: Objective measures were completed at Evaluation unless otherwise noted.  DIAGNOSTIC FINDINGS:  MRI cervical demonstrating: - At C4-5, C5-6: disc bulging and facet hypertrophy with mild spinal stenosis and mild bilateral foraminal stenosis.          Electronically signed by Onita Duos, MD at 06/05/2024  4:44 PM MRI of lumbar spine showed severe degenerative changes L 4-5, posterior subluxation of L5 over L4, with severe canal stenosis, bilateral foraminal narrowing, EMG nerve conduction study demonstrated mild to moderate length-dependent peripheral neuropathy, chronic bilateral lumbosacral radiculopathy, may have a component of mild chronic right cervical radiculopathy PATIENT SURVEYS:  Modified Oswestry Low Back Pain Disability Questionnaire: 21 / 50 = 42.0 %  COGNITION: Overall cognitive status: Within functional limits for tasks assessed     SENSATION: Gross loss of light touch reported B lower legs and feet but reports intermittent sensory changes  MUSCLE LENGTH: Hamstrings: Right wnl deg; Left wnl deg Debby test: Right nt deg; Left nt deg  POSTURE: multiple postural/ arthritic changes, B genu valgum, R iliac crest elevated with scoliotic lumbar and thoracic, flexed and IR B hips to 15 degrees. B lower legs /ankles edematous , L greater than R  PALPATION: Tender B lower legs over TA, L  ankle throughout  LUMBAR ROM:   AROM eval  Flexion Wnl, able to touch floor from standing  Extension 5 degree with p!  Right lateral flexion 50  Left lateral flexion 35 with pain L lat hip  Right rotation   Left rotation    (Blank rows = not tested)  LOWER EXTREMITY ROM:     Passive  Right eval Left eval  Hip flexion wnl wnl  Hip extension -15 -15  Hip abduction    Hip adduction    Hip internal rotation wnl wnl  Hip external rotation wnl wnl  Knee flexion    Knee extension    Ankle dorsiflexion    Ankle plantarflexion  Ankle inversion    Ankle eversion     (Blank rows = not tested)  LOWER EXTREMITY MMT:    MMT Right eval Left eval  Hip flexion 4 4  Hip extension 3+ 3+  Hip abduction    Hip adduction    Hip internal rotation    Hip external rotation    Knee flexion 5 5  Knee extension    Ankle dorsiflexion  5 5  Ankle plantarflexion 3- 3-  Ankle inversion    Ankle eversion     (Blank rows = not tested)  LUMBAR SPECIAL TESTS:  Straight leg raise test: Negative, Slump test: Negative, and FABER test: Negative  FUNCTIONAL TESTS:  Functional gait assessment:  FUNCTIONAL GAIT ASSESSMENT  Date: 07/03/24 Score  GAIT LEVEL SURFACE Instructions: Walk at your normal speed from here to the next mark (6 m) [20 ft]. (1) Moderate impairment-Walks 6 m (20 ft), slow speed, abnormal gait pattern, evidence for imbalance, or deviates 25.4 - 38.1 cm (10 -15 in) outside of the 30.48-cm (12-in) walkway width. Requires more than 7 seconds to ambulate 6 m (20 ft).  2.   CHANGE IN GAIT SPEED Instructions: Begin walking at your normal pace (for 1.5 m [5 ft]). When I tell you "go," walk as fast as you can (for 1.5 m [5 ft]). When I tell you "slow," walk as slowly as you can (for 1.5 m [5 ft]. (1) Moderate impairment - Makes only minor adjustments to walking speed, or accomplishes a change in speed with significant gait deviations, deviates 25.4 -38.1 cm (10 -15 in) outside the  30.48-cm (12-in) walkway width, or changes speed but loses balance but is able to recover and continue walking.  3.    GAIT WITH HORIZONTAL HEAD TURNS Instructions: Walk from here to the next mark 6 m (20 ft) away. Begin walking at your normal pace. Keep walking straight; after 3 steps, turn your head to the right and keep walking straight while looking to the right. After 3 more steps, turn your head to the left and keep walking straight while looking left. Continue alternating looking right and left. (1) Moderate impairment - Performs head turns with moderate change in gait velocity, slows down, deviates 25.4 -38.1 cm (10 -15 in) outside 30.48-cm (12-in) walkway width but recovers, can continue to walk.  4.   GAIT WITH VERTICAL HEAD TURNS Instructions: Walk from here to the next mark (6 m [20 ft]). Begin walking at your normal pace. Keep walking straight; after 3 steps, tip your head up and keep walking straight while looking up. After 3 more steps, tip your head down, keep walking straight while looking down. Continue  alternating looking up and down every 3 steps until you have completed 2 repetitions in each direction. (1) Moderate impairment - Performs task with moderate change in gait velocity, slows down, deviates 25.4 -38.1 cm (10 -15 in) outside 30.48-cm (12-in) walkway width but recovers, can continue to walk.  5.  GAIT AND PIVOT TURN Instructions: Begin with walking at your normal pace. When I tell you, "turn and stop," turn as quickly as you can to face the opposite direction and stop. (1) Moderate impairment - Turns slowly, requires verbal cueing, or requires several small steps to catch balance following turn and stop  6.   STEP OVER OBSTACLE Instructions: Begin walking at your normal speed. When you come to the shoe box, step over it, not around it, and keep walking. (1) Moderate impairment - Is able  to step over one shoe box (11.43 cm [4.5 in] total height) but must slow down and adjust  steps to clear box safely. May require verbal cueing.  7.   GAIT WITH NARROW BASE OF SUPPORT Instructions: Walk on the floor with arms folded across the chest, feet aligned heel to toe in tandem for a distance of 3.6 m [12 ft]. The number of steps taken in a straight line are counted for a maximum of 10 steps. (0) Severe impairment - Ambulates less than 4 steps heel to toe or cannot perform without assistance.  8.   GAIT WITH EYES CLOSED Instructions: Walk at your normal speed from here to the next mark (6 m [20 ft]) with your eyes closed. (2) Mild impairment - Walks 6 m (20 ft), uses assistive device, slower speed, mild gait deviations, deviates 15.24 -25.4 cm (6 -10 in) outside 30.48-cm (12-in) walkway width. Ambulates 6 m (20 ft) in less than 9 seconds but greater than 7 seconds  9.   AMBULATING BACKWARDS Instructions: Walk backwards until I tell you to stop (2) Mild impairment - Walks 6 m (20 ft), uses assistive device, slower speed, mild gait deviations, deviates 15.24 -25.4 cm (6 -10 in) outside 30.48-cm (12-in) walkway width  10. STEPS Instructions: Walk up these stairs as you would at home (ie, using the rail if necessary). At the top turn around and walk down. (1) Moderate impairment-Two feet to a stair; must use rail.  Total 11/30   Interpretation of scores: Non-Specific Older Adults Cutoff Score: <=22/30 = risk of falls Parkinson's Disease Cutoff score <15/30= fall risk (Hoehn & Yahr 1-4)  Minimally Clinically Important Difference (MCID)  Stroke (acute, subacute, and chronic) = MDC: 4.2 points Vestibular (acute) = MDC: 6 points Community Dwelling Older Adults =  MCID: 4 points Parkinson's Disease  =  MDC: 4.3 points  (Academy of Neu[employer]ic Physical Therapy (nd). Functional Gait Assessment. Retrieved from https://www.neuropt.org/docs/default-source/cpgs/core-outcome-measures/function-gait-assessment-pocket-guide-proof9-(2).pdf?sfvrsn=b96f35043_0.)  GAIT: Distance walked: in clinic  23' Assistive device utilized: Single point cane Level of assistance: Modified independence Comments: wide base of support, minimal ankle movement B plants on B forefeet   TREATMENT DATE:  07/26/24 Nustep Level 5 x 5 minutes More square 2 blocks step overs all directions used SPC and CGA On airex reaching and ball toss CGA Discussion and education on fall risk and safety with walking, trying to avoid multitasking and looking to the side while walking HHA direction changes In pbars marching and hip abduction Feet on ball K2C, rotation, small bridge, isometric abs Passive stretch HS and piriformis Manual sheet traction  07/10/24 Manual : Sidelying on L side, R knee over bolster, for theragun deep tissue massage R gluteals, piriformis, Tfl Sidelying L for muscle energy to improve lumbar ext, rot. Side bending restriction, 5 bouts, 5 sec holds.    Therex: Supine for LTR Supine R piriformis stretch R foot on L knee  Seated Red t band marching: Seated L trunk rotation stretch, sliding b hands down lateral lower leg Seated hamstring stretch with yoga strap Sit to stand from standard chair without UE use   07/03/24: Eval:  Instructed in supine LTR, and supine hip/piriformis stretch each leg Education regarding anatomy of spine and goal for therex for spinal decompression  Instructed to trial lumbar support/corset  which she has at home to reduce compressive forces lumbar spine.  PATIENT EDUCATION:  Education details: POC, goals  Person educated: Patient Education method: Explanation, Demonstration, Tactile cues, Verbal cues, and Handouts Education comprehension: verbalized understanding and returned demonstration  HOME EXERCISE PROGRAM: Access Code: [email or web address] URL: [email or web address] Date: 07/10/2024 Prepared by: Briony Parveen  Exercises - Lower Trunk Rotations  - 1 x daily - 7 x weekly - 3 sets - 10 reps - Supine Piriformis Stretch with Foot on Ground  - 1 x daily - 7 x weekly - 3 sets - 10 reps - Seated Hamstring Stretch with Strap  - 1 x daily - 7 x weekly - 3 sets - 10 reps - Seated Lumbar Flexion Stretch  - 1 x daily - 7 x weekly - 3 sets - 10 reps - Seated March with Resistance  - 1 x daily - 7 x weekly - 3 sets - 10 reps Access Code: [email or web address] URL: [email or web address] Date: 07/03/2024 Prepared by: Camiya Vinal  Exercises - Lower Trunk Rotations  - 1 x daily - 7 x weekly - 3 sets - 10 reps - Supine Piriformis Stretch with Foot on Ground  - 1 x daily - 7 x weekly - 3 sets - 10 reps  ASSESSMENT:  CLINICAL IMPRESSION: Patient reports difficulty with balance and difficulty with right posterior thigh pain and cramping.  We did some higher level balance some easy exercises and then finished with passive LE stretches and manual sheet traction,  With the balance activities she does require CGA due to poor balance Eval:Patient is a 83 y.o. female, who was evaluated today by physical therapy due to new onset of B LE paraesthesia.  She is well known to this clinic.  She has marked changes on MRI with lumbar spinal stenosis.  Currently her paraesthesia and pain are increased/provoked with standing and lumbar extension movement , typical for this diagnosis. She also has balance deficits, uses a cane and is careful/ guarded with her movements anc activities.  She has multiple postural changes which most likely affect her balance.   Initiated lumbar stretches today, with pt in supine for a neutral spine position.  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 She should benefit from physical thearpy promarily for education and ex progression to assist her with decompression of lumbar region.  OBJECTIVE IMPAIRMENTS: decreased balance, decreased knowledge of condition,  difficulty walking, increased edema, postural dysfunction, and pain.   ACTIVITY LIMITATIONS: lifting, standing, squatting, stairs, and locomotion level  PARTICIPATION LIMITATIONS: meal prep, cleaning, community activity, and yard work  PERSONAL FACTORS: Age, Behavior pattern, Fitness, Past/current experiences, Time since onset of injury/illness/exacerbation, and 1-2 comorbidities: h/o L ankle fx, HTN, h/o TIA, lives alone are also affecting patient's functional outcome.   REHAB POTENTIAL: Good  CLINICAL DECISION MAKING: Evolving/moderate complexity  EVALUATION COMPLEXITY: Moderate   GOALS: Goals reviewed with patient? Yes  SHORT TERM GOALS: Target date: 2 weeks, 07/17/24  I HEP Baseline:initiated Goal status: met 07/26/24  LONG TERM GOALS: Target date: 09/25/24  Modified Oswestry Low Back Pain Disability Questionnaire: 21 / 50 = 42.0 % improve to 32%  Baseline:  Goal status: INITIAL  2.  FGA 11/30 improve to 20/30 Baseline:  Goal status: INITIAL  3.  Patient able to demonstrate/ replicate positioning in sitting, standing, with lifting to reduce compression lumbar region Baseline:  Goal status: INITIAL   PLAN:  PT FREQUENCY: 1x/week  PT DURATION: 12 weeks  PLANNED INTERVENTIONS: 97110-Therapeutic exercises, 97530- Therapeutic activity, [id number]- Neuromuscular re-education, 97535- Self Care,  [id number]- Manual therapy, [id number]- Traction (mechanical), Patient/Family education, and Balance training.  PLAN FOR NEXT SESSION: how have exercises worked, did she try the LS corset, advance lumbar flexion ex and hip strengthening.manual techniques as needed   Yatzari Jonsson L Graves Nipp, PT  07/29/2024, 8:08 AM

## 2024-07-30 ENCOUNTER — Ambulatory Visit: Admitting: Nurse Practitioner

## 2024-07-30 ENCOUNTER — Encounter: Payer: Self-pay | Admitting: Nurse Practitioner

## 2024-07-30 VITALS — BP 132/76 | HR 73 | Temp 97.7°F | Ht 63.0 in | Wt 190.2 lb

## 2024-07-30 DIAGNOSIS — J301 Allergic rhinitis due to pollen: Secondary | ICD-10-CM

## 2024-07-30 DIAGNOSIS — R053 Chronic cough: Secondary | ICD-10-CM

## 2024-07-30 DIAGNOSIS — J4530 Mild persistent asthma, uncomplicated: Secondary | ICD-10-CM | POA: Diagnosis not present

## 2024-07-30 NOTE — Assessment & Plan Note (Signed)
 Well controlled on current regimen. Trigger prevention reviewed. Encouraged to continue controller therapies and allergy regimen. Action plan in place.  Patient Instructions  Continue Albuterol  inhaler 2 puffs every 6 hours as needed for shortness of breath or wheezing. Notify if symptoms persist despite rescue inhaler/neb use.  Continue Breo 1 puff daily. Brush tongue and rinse mouth afterwards Continue allegra 1 tab daily  Continue flonase  nasal spray 2 sprays each nostril daily   Follow up in 6 months with Dr. Geronimo. If symptoms do not improve or worsen, please contact office for sooner follow up or seek emergency care.

## 2024-07-30 NOTE — Patient Instructions (Addendum)
 Continue Albuterol  inhaler 2 puffs every 6 hours as needed for shortness of breath or wheezing. Notify if symptoms persist despite rescue inhaler/neb use.  Continue Breo 1 puff daily. Brush tongue and rinse mouth afterwards Continue allegra 1 tab daily  Continue flonase  nasal spray 2 sprays each nostril daily   Follow up in 6 months with Dr. Geronimo. If symptoms do not improve or worsen, please contact office for sooner follow up or seek emergency care.

## 2024-07-30 NOTE — Assessment & Plan Note (Signed)
 Stable on current regimen

## 2024-07-30 NOTE — Progress Notes (Signed)
 @Patient  ID: Kerry  AKILAH [name], female    DOB: 08-10-1941, 83 y.o.   MRN: [id number]  Chief Complaint  Patient presents with   Cough    Pt stated since LOV breathing has been alright, Pt stated she is having phlegm ( clear)  come up but once she takes breo she's okay.     Referring provider: Berneta Elsie Sim DEWAINE  HPI: 83 year old female, never smoker followed for allergic asthma and chronic cough. She is a patient of Dr. Reeves and last seen in office 02/26/2024. Past medical history significant for hx of TIA, PH, MVR, DCM, HTN, HLD.   TEST/EVENTS:  08/15/2023 allergen panel positive for dust, cat; FeNO 40 ppb  10/02/2023 HRCT chest: atherosclerosis. Cardiomegaly. Right thyroid  lobe enlarged with nodule previously evaluated with US  and bx. Small hiatal hernia with wall thickening. Mosaic pulmonary parenchymal attenuation with air trapping. No ILD. Punctate renal stone. Mild pancreatic ductal dilatation, up to 4 mm.  12/06/2023 eos: 400   02/26/2024: OV with Dr. Geronimo. Diagnosed with allergic asthma with cat and dust mite exposure. Other factors contributing to her cough included Zestril  and carvedilol . Rare cough now. SOB also improved. Plan to downsize to [facility]. Rx sent. Trigger prevention reviewed. Prior imaging with concern for esophageal neoplasm; endoscopy without any concerning findings.   07/30/2024: Today - follow up Discussed the use of AI scribe software for clinical note transcription with the patient, who gave verbal consent to proceed.  History of Present Illness Kerry [name] is an 83 year old female with asthma who presents for follow-up of her asthma management.  She has a long-standing history of asthma since childhood, with severe episodes in childhood. She started to have chronic cough and SOB in adulthood. She was previously on Trelegy but switched to Breo due to cost considerations. She feels that Ranell is still working well. She's not sure if it's as  beneficial as Trelegy was but fall allergy season is also a difficult time for her. She still feels she is well controlled with minimal cough and no wheezing. The cough occurs in the mornings. She has not needed to use her albuterol  inhaler. No chest tightness or PND. Breathing has improved with inhaler therapy.   She takes Allegra daily in the morning and uses Flonase  nasal spray for her sinus symptoms, which she describes as manageable despite some stuffiness. She experiences a cough that is mostly clear but occasionally produces a small amount of green sputum in the morning. No fevers, hemoptysis, night sweats, headaches, chest congestion.     Allergies  Allergen Reactions   Lovastatin Other (See Comments)    myalgias   Sudafed [Pseudoephedrine Hcl] Other (See Comments)    Hallucinations     Immunization History  Administered Date(s) Administered   Fluad Quad(high Dose 65+) 05/31/2022   Fluzone Influenza virus vaccine,trivalent (IIV3), split virus 06/05/2017   INFLUENZA, HIGH DOSE SEASONAL PF 07/16/2018, 08/25/2023, 06/24/2024   Influenza Inj Mdck Quad With Preservative 06/05/2017   Influenza Split 06/19/2014   Influenza-Unspecified 05/31/2015, 06/13/2016, 06/23/2020, 07/04/2021   Moderna Sars-Covid-2 Vaccination 08/01/2020   PFIZER(Purple Top)SARS-COV-2 Vaccination 11/06/2019, 11/27/2019   Pneumococcal Conjugate-13 04/14/2017   Pneumococcal Polysaccharide-23 06/28/2009   Respiratory Syncytial Virus Vaccine,Recomb Aduvanted(Arexvy) 08/25/2023   Tdap 01/06/2023    Past Medical History:  Diagnosis Date   Allergy    Anemia    in her 20's   Aortic insufficiency    i   Arthritis    not dx'd- just per pt  Asthma    AS A CHILD   Cancer (HCC)    endometrial cancer with hysterectomy in 1987   Cancer (HCC)    melanoma on face    Cataract    removed both eyes    DCM (dilated cardiomyopathy) (HCC)    EF 50-55%   GERD (gastroesophageal reflux disease)    occ has with diet     History of syncope    HTN (hypertension)    Hyperlipidemia    Mitral regurgitation    mild by echo 03/2023   OSA on CPAP    Pulmonary HTN (HCC)    PASP on echo 03/2023   TIA (transient ischemic attack)    Tricuspid regurgitation    moderate by echo 03/2023    Tobacco History: Social History   Tobacco Use  Smoking Status Never   Passive exposure: Past  Smokeless Tobacco Never   Counseling given: Not Answered   Outpatient Medications Prior to Visit  Medication Sig Dispense Refill   acetaminophen  (TYLENOL ) 500 MG tablet Take 500 mg by mouth every 6 (six) hours as needed for moderate pain or headache.      alendronate  (FOSAMAX ) 70 MG tablet TAKE 1 TABLET(70 MG) BY MOUTH 1 TIME A WEEK WITH A FULL GLASS OF WATER AND ON AN EMPTY STOMACH 12 tablet 0   aspirin  81 MG chewable tablet Chew 81 mg by mouth every other day.     atorvastatin  (LIPITOR) 20 MG tablet TAKE 1 TABLET(20 MG) BY MOUTH DAILY 90 tablet 2   carvedilol  (COREG ) 12.5 MG tablet TAKE 1 TABLET(12.5 MG) BY MOUTH TWICE DAILY 180 tablet 1   Cholecalciferol (VITAMIN D3) 2000 units capsule Take 2,000 Units by mouth daily.     Coenzyme Q10-Vitamin E (QUNOL ULTRA COQ10 PO) Take 1 capsule by mouth daily.     Cyanocobalamin  500 MCG CHEW Take one daily 90 tablet 1   fexofenadine (ALLEGRA) 180 MG tablet Take 180 mg by mouth every morning.     fluorouracil (EFUDEX) 5 % cream 1 Application as needed.     fluticasone  (FLONASE ) 50 MCG/ACT nasal spray SHAKE LIQUID AND USE 2 SPRAYS IN EACH NOSTRIL DAILY 48 g 1   fluticasone  furoate-vilanterol (BREO ELLIPTA ) 100-25 MCG/ACT AEPB Inhale 1 puff into the lungs daily. 28 each 3   Iron , Ferrous Sulfate , 325 (65 Fe) MG TABS Take 325 mg by mouth daily. 90 tablet 3   lisinopril  (ZESTRIL ) 20 MG tablet TAKE 1 TABLET(20 MG) BY MOUTH DAILY 90 tablet 3   Misc Natural Products (NEURIVA PO) Take 1 tablet by mouth daily.     Multiple Vitamin (MULTIVITAMIN WITH MINERALS) TABS tablet Take 1 tablet by  mouth daily.     pantoprazole  (PROTONIX ) 40 MG tablet Take 1 tablet (40 mg total) by mouth daily. 30 tablet 11   Polyethyl Glycol-Propyl Glycol (SYSTANE) 0.4-0.3 % SOLN Place 1 drop into both eyes daily as needed (dry eyes).     Ascorbic Acid (VITAMIN C PO) Take 1 tablet by mouth daily. (Patient not taking: Reported on 07/30/2024)     Facility-Administered Medications Prior to Visit  Medication Dose Route Frequency Provider Last Rate Last Admin   fluticasone  furoate-vilanterol (BREO ELLIPTA ) 100-25 MCG/ACT 1 puff  1 puff Inhalation Daily          Review of Systems: as above    Physical Exam:  BP 132/76   Pulse 73   Temp 97.7 F (36.5 C) (Oral)   Ht 5' 3 (1.6 m) Comment:  per patient  Wt 190 lb 3.2 oz (86.3 kg)   SpO2 97% Comment: on RA  BMI 33.69 kg/m   GEN: Pleasant, interactive, well-appearing; obese; in no acute distress HEENT:  Normocephalic and atraumatic. PERRLA. Sclera white. Nasal turbinates pink, moist and patent bilaterally. No rhinorrhea present. Oropharynx pink and moist, without exudate or edema. No lesions, ulcerations, or postnasal drip.  NECK:  Supple w/ fair ROM. No lymphadenopathy.   CV: RRR, no m/r/g, no peripheral edema. Pulses intact, +2 bilaterally. No cyanosis, pallor or clubbing. PULMONARY:  Unlabored, regular breathing. Clear bilaterally A&P w/o wheezes/rales/rhonchi. No accessory muscle use.  GI: BS present and normoactive. Soft, non-tender to palpation. MSK: No erythema, warmth or tenderness.   Neuro: A/Ox3. No focal deficits noted.   Skin: Warm, no lesions or rashe Psych: Normal affect and behavior. Judgement and thought content appropriate.     Lab Results:  CBC    Component Value Date/Time   WBC 7.9 12/06/2023 1057   RBC 3.72 (L) 12/06/2023 1057   HGB 11.8 (L) 12/06/2023 1057   HGB 11.6 07/10/2020 1209   HCT 35.0 (L) 12/06/2023 1057   HCT 36.4 07/10/2020 1209   PLT 222.0 12/06/2023 1057   PLT 311 06/27/2019 1006   MCV 94.1 12/06/2023  1057   MCV 95 07/10/2020 1209   MCH 31.0 03/08/2023 2222   MCHC 33.8 12/06/2023 1057   RDW 13.0 12/06/2023 1057   RDW 11.5 (L) 07/10/2020 1209   LYMPHSABS 1.7 12/06/2023 1057   LYMPHSABS 1.9 07/10/2020 1209   MONOABS 0.7 12/06/2023 1057   EOSABS 0.4 12/06/2023 1057   EOSABS 0.4 07/10/2020 1209   BASOSABS 0.1 12/06/2023 1057   BASOSABS 0.1 07/10/2020 1209    BMET    Component Value Date/Time   NA 140 07/25/2024 0957   NA 139 07/10/2020 1209   K 4.2 07/25/2024 0957   CL 105 07/25/2024 0957   CO2 26 07/25/2024 0957   GLUCOSE 91 07/25/2024 0957   BUN 16 07/25/2024 0957   BUN 25 07/10/2020 1209   CREATININE 0.82 07/25/2024 0957   CREATININE 0.84 04/28/2023 1530   CALCIUM  9.4 07/25/2024 0957   GFRNONAA 57 (L) 03/08/2023 2222   GFRAA 82 07/10/2020 1209    BNP    Component Value Date/Time   BNP 67.1 02/22/2018 1710     Imaging:  No results found.  Administration History     None          Latest Ref Rng & Units 05/29/2023    7:45 AM  PFT Results  FVC-Pre L 1.87   FVC-Predicted Pre % 76   FVC-Post L 1.97   FVC-Predicted Post % 80   Pre FEV1/FVC % % 77   Post FEV1/FCV % % 77   FEV1-Pre L 1.45   FEV1-Predicted Pre % 79   FEV1-Post L 1.51   DLCO uncorrected ml/min/mmHg 9.88   DLCO UNC% % 54   DLVA Predicted % 73   TLC L 4.43   TLC % Predicted % 90   RV % Predicted % 105     No results found for: NITRICOXIDE      Assessment & Plan:   Asthma Well controlled on current regimen. Trigger prevention reviewed. Encouraged to continue controller therapies and allergy regimen. Action plan in place.  Patient Instructions  Continue Albuterol  inhaler 2 puffs every 6 hours as needed for shortness of breath or wheezing. Notify if symptoms persist despite rescue inhaler/neb use.  Continue Breo 1 puff daily. Brush  tongue and rinse mouth afterwards Continue allegra 1 tab daily  Continue flonase  nasal spray 2 sprays each nostril daily   Follow up in 6 months  with Dr. Geronimo. If symptoms do not improve or worsen, please contact office for sooner follow up or seek emergency care.    Seasonal allergic rhinitis due to pollen Stable on current regimen   Advised if symptoms do not improve or worsen, to please contact office for sooner follow up or seek emergency care.   I spent 25 minutes of dedicated to the care of this patient on the date of this encounter to include pre-visit review of records, face-to-face time with the patient discussing conditions above, post visit ordering of testing, clinical documentation with the electronic health record, making appropriate referrals as documented, and communicating necessary findings to members of the patients care team.  Comer LULLA Rouleau, NP 07/30/2024  Pt aware and understands NP's role.

## 2024-07-31 ENCOUNTER — Other Ambulatory Visit: Payer: Self-pay | Admitting: Internal Medicine

## 2024-08-15 NOTE — Telephone Encounter (Signed)
 NFN

## 2024-08-23 ENCOUNTER — Other Ambulatory Visit: Payer: Self-pay | Admitting: Physician Assistant

## 2024-08-23 DIAGNOSIS — M8589 Other specified disorders of bone density and structure, multiple sites: Secondary | ICD-10-CM

## 2024-08-23 NOTE — Telephone Encounter (Signed)
 Last Fill: 06/03/2024  Labs: 07/25/2024 BMP WNL, 03/26/2024 Vitamin D  34.8  Next Visit: 01/01/2025  Last Visit: 07/03/2024  DX: Osteopenia of multiple sites   Current Dose per office note 07/03/2024: Fosamax  70 mg weekly   Okay to refill Fosamax ?

## 2024-08-29 ENCOUNTER — Other Ambulatory Visit: Payer: Self-pay | Admitting: Cardiology

## 2024-09-16 ENCOUNTER — Ambulatory Visit: Admitting: Family Medicine

## 2024-09-16 ENCOUNTER — Encounter: Payer: Self-pay | Admitting: Family Medicine

## 2024-09-16 VITALS — BP 130/58 | HR 78 | Temp 97.8°F | Ht 63.0 in | Wt 193.0 lb

## 2024-09-16 DIAGNOSIS — E538 Deficiency of other specified B group vitamins: Secondary | ICD-10-CM | POA: Diagnosis not present

## 2024-09-16 DIAGNOSIS — R7303 Prediabetes: Secondary | ICD-10-CM

## 2024-09-16 DIAGNOSIS — I10 Essential (primary) hypertension: Secondary | ICD-10-CM | POA: Diagnosis not present

## 2024-09-16 DIAGNOSIS — E7800 Pure hypercholesterolemia, unspecified: Secondary | ICD-10-CM | POA: Diagnosis not present

## 2024-09-16 DIAGNOSIS — E611 Iron deficiency: Secondary | ICD-10-CM

## 2024-09-16 DIAGNOSIS — I872 Venous insufficiency (chronic) (peripheral): Secondary | ICD-10-CM | POA: Diagnosis not present

## 2024-09-16 LAB — BASIC METABOLIC PANEL WITH GFR
BUN: 20 mg/dL (ref 6–23)
CO2: 28 meq/L (ref 19–32)
Calcium: 9.1 mg/dL (ref 8.4–10.5)
Chloride: 107 meq/L (ref 96–112)
Creatinine, Ser: 0.66 mg/dL (ref 0.40–1.20)
GFR: 81.47 mL/min
Glucose, Bld: 100 mg/dL — ABNORMAL HIGH (ref 70–99)
Potassium: 4.4 meq/L (ref 3.5–5.1)
Sodium: 140 meq/L (ref 135–145)

## 2024-09-16 LAB — CBC
HCT: 36.3 % (ref 36.0–46.0)
Hemoglobin: 12.2 g/dL (ref 12.0–15.0)
MCHC: 33.5 g/dL (ref 30.0–36.0)
MCV: 94.7 fl (ref 78.0–100.0)
Platelets: 202 K/uL (ref 150.0–400.0)
RBC: 3.83 Mil/uL — ABNORMAL LOW (ref 3.87–5.11)
RDW: 13 % (ref 11.5–15.5)
WBC: 7.8 K/uL (ref 4.0–10.5)

## 2024-09-16 LAB — VITAMIN B12: Vitamin B-12: 532 pg/mL (ref 211–911)

## 2024-09-16 NOTE — Progress Notes (Signed)
 "  Established Patient Office Visit   Subjective:  Patient ID: Kerry  JELISA [name], female    DOB: February 11, 1941  Age: 84 y.o. MRN: [id number]  Chief Complaint  Patient presents with   Follow-up    HPI Encounter Diagnoses  Name Primary?   Essential hypertension Yes   Hypercholesterolemia    Venous insufficiency    Iron  deficiency    Prediabetes    B12 deficiency    For follow-up of above.  Blood pressure remains well-controlled with carvedilol  and lisinopril .  Continues atorvastatin  20 mg daily for elevated cholesterol.  Continues compression stockings for venous insufficiency.  Continues iron  sulfate 325 mg daily for iron  deficiency.  Continues 500 mcg of cyanocobalamin  for B12 deficiency.   Review of Systems  Constitutional: Negative.   HENT: Negative.    Eyes:  Negative for blurred vision, discharge and redness.  Respiratory: Negative.    Cardiovascular: Negative.   Gastrointestinal:  Negative for abdominal pain.  Genitourinary: Negative.   Musculoskeletal: Negative.  Negative for myalgias.  Skin:  Negative for rash.  Neurological:  Negative for tingling, loss of consciousness and weakness.  Endo/Heme/Allergies:  Negative for polydipsia.    Current Medications[1]   Objective:     BP (!) 130/58   Pulse 78   Temp 97.8 F (36.6 C) (Oral)   Ht 5' 3 (1.6 m)   Wt 193 lb (87.5 kg)   SpO2 98%   BMI 34.19 kg/m  BP Readings from Last 3 Encounters:  09/16/24 (!) 130/58  07/30/24 132/76  07/25/24 (!) 142/74   Wt Readings from Last 3 Encounters:  09/16/24 193 lb (87.5 kg)  07/30/24 190 lb 3.2 oz (86.3 kg)  07/25/24 189 lb (85.7 kg)      Physical Exam Constitutional:      General: She is not in acute distress.    Appearance: Normal appearance. She is not ill-appearing, toxic-appearing or diaphoretic.  HENT:     Head: Normocephalic and atraumatic.     Right Ear: External ear normal.     Left Ear: External ear normal.  Eyes:     General: No scleral icterus.        Right eye: No discharge.        Left eye: No discharge.     Extraocular Movements: Extraocular movements intact.     Conjunctiva/sclera: Conjunctivae normal.  Cardiovascular:     Rate and Rhythm: Normal rate and regular rhythm.  Pulmonary:     Effort: Pulmonary effort is normal. No respiratory distress.     Breath sounds: No wheezing, rhonchi or rales.  Musculoskeletal:     Right lower leg: Swelling present.     Left lower leg: Swelling present.       Legs:  Skin:    General: Skin is warm and dry.  Neurological:     Mental Status: She is alert and oriented to person, place, and time.  Psychiatric:        Mood and Affect: Mood normal.        Behavior: Behavior normal.      No results found for any visits on 09/16/24.    The ASCVD Risk score (Arnett DK, et al., 2019) failed to calculate for the following reasons:   The 2019 ASCVD risk score is only valid for ages 18 to 53   Risk score cannot be calculated because patient has a medical history suggesting prior/existing ASCVD   * - Cholesterol units were assumed    Assessment & Plan:  Essential hypertension -     Basic metabolic panel with GFR  Hypercholesterolemia  Venous insufficiency  Iron  deficiency -     CBC -     Iron , TIBC and Ferritin Panel  Prediabetes  B12 deficiency -     Vitamin B12    Return in about 6 months (around 03/16/2025), or if symptoms worsen or fail to improve.  Continue above medications.  Be mindful of sodium intake.  Continue support hose.  [name], MD    [1]  Current Outpatient Medications:    acetaminophen  (TYLENOL ) 500 MG tablet, Take 500 mg by mouth every 6 (six) hours as needed for moderate pain or headache. , Disp: , Rfl:    alendronate  (FOSAMAX ) 70 MG tablet, TAKE 1 TABLET(70 MG) BY MOUTH 1 TIME A WEEK WITH A FULL GLASS OF WATER AND ON AN EMPTY STOMACH, Disp: 12 tablet, Rfl: 0   Ascorbic Acid (VITAMIN C PO), Take 1 tablet by mouth daily., Disp: , Rfl:     aspirin  81 MG chewable tablet, Chew 81 mg by mouth every other day., Disp: , Rfl:    atorvastatin  (LIPITOR) 20 MG tablet, TAKE 1 TABLET(20 MG) BY MOUTH DAILY, Disp: 90 tablet, Rfl: 2   carvedilol  (COREG ) 12.5 MG tablet, TAKE 1 TABLET(12.5 MG) BY MOUTH TWICE DAILY, Disp: 180 tablet, Rfl: 0   Cholecalciferol (VITAMIN D3) 2000 units capsule, Take 2,000 Units by mouth daily., Disp: , Rfl:    Coenzyme Q10-Vitamin E (QUNOL ULTRA COQ10 PO), Take 1 capsule by mouth daily., Disp: , Rfl:    Cyanocobalamin  500 MCG CHEW, Take one daily, Disp: 90 tablet, Rfl: 1   fexofenadine (ALLEGRA) 180 MG tablet, Take 180 mg by mouth every morning., Disp: , Rfl:    fluorouracil (EFUDEX) 5 % cream, 1 Application as needed., Disp: , Rfl:    fluticasone  (FLONASE ) 50 MCG/ACT nasal spray, SHAKE LIQUID AND USE 2 SPRAYS IN EACH NOSTRIL DAILY, Disp: 48 g, Rfl: 1   fluticasone  furoate-vilanterol (BREO ELLIPTA ) 100-25 MCG/ACT AEPB, INHALE 1 PUFF BY MOUTH ONCE DAILY, Disp: 60 each, Rfl: 3   Iron , Ferrous Sulfate , 325 (65 Fe) MG TABS, Take 325 mg by mouth daily., Disp: 90 tablet, Rfl: 3   lisinopril  (ZESTRIL ) 20 MG tablet, TAKE 1 TABLET(20 MG) BY MOUTH DAILY, Disp: 90 tablet, Rfl: 3   Misc Natural Products (NEURIVA PO), Take 1 tablet by mouth daily., Disp: , Rfl:    Multiple Vitamin (MULTIVITAMIN WITH MINERALS) TABS tablet, Take 1 tablet by mouth daily., Disp: , Rfl:    pantoprazole  (PROTONIX ) 40 MG tablet, Take 1 tablet (40 mg total) by mouth daily., Disp: 30 tablet, Rfl: 11   Polyethyl Glycol-Propyl Glycol (SYSTANE) 0.4-0.3 % SOLN, Place 1 drop into both eyes daily as needed (dry eyes)., Disp: , Rfl:   Current Facility-Administered Medications:    fluticasone  furoate-vilanterol (BREO ELLIPTA ) 100-25 MCG/ACT 1 puff, 1 puff, Inhalation, Daily,   "

## 2024-09-17 ENCOUNTER — Ambulatory Visit: Payer: Self-pay | Admitting: Family Medicine

## 2024-09-17 LAB — IRON,TIBC AND FERRITIN PANEL
%SAT: 30 % (ref 16–45)
Ferritin: 36 ng/mL (ref 16–288)
Iron: 95 ug/dL (ref 45–160)
TIBC: 316 ug/dL (ref 250–450)

## 2024-09-19 NOTE — Telephone Encounter (Signed)
Patient has been notified directly; all questions, if any, were answered. Patient voiced understanding.   

## 2024-10-17 ENCOUNTER — Other Ambulatory Visit: Payer: Self-pay | Admitting: Family Medicine

## 2024-10-17 DIAGNOSIS — H6992 Unspecified Eustachian tube disorder, left ear: Secondary | ICD-10-CM

## 2024-10-28 ENCOUNTER — Ambulatory Visit: Admitting: Family Medicine

## 2025-01-01 ENCOUNTER — Ambulatory Visit: Admitting: Rheumatology

## 2025-03-17 ENCOUNTER — Ambulatory Visit: Admitting: Family Medicine

## 2025-06-30 ENCOUNTER — Ambulatory Visit
